# Patient Record
Sex: Male | Born: 1945
Health system: Southern US, Community
[De-identification: ages and names within clinical notes are randomized; demographics above are authoritative.]

## PROBLEM LIST (undated history)

## (undated) DIAGNOSIS — S82121A Displaced fracture of lateral condyle of right tibia, initial encounter for closed fracture: Secondary | ICD-10-CM

## (undated) DIAGNOSIS — Z8711 Personal history of peptic ulcer disease: Secondary | ICD-10-CM

## (undated) DIAGNOSIS — I10 Essential (primary) hypertension: Secondary | ICD-10-CM

## (undated) DIAGNOSIS — E785 Hyperlipidemia, unspecified: Secondary | ICD-10-CM

## (undated) DIAGNOSIS — M109 Gout, unspecified: Secondary | ICD-10-CM

## (undated) DIAGNOSIS — I219 Acute myocardial infarction, unspecified: Secondary | ICD-10-CM

## (undated) DIAGNOSIS — Z87891 Personal history of nicotine dependence: Secondary | ICD-10-CM

## (undated) DIAGNOSIS — T8859XA Other complications of anesthesia, initial encounter: Secondary | ICD-10-CM

## (undated) DIAGNOSIS — N200 Calculus of kidney: Secondary | ICD-10-CM

## (undated) DIAGNOSIS — M1990 Unspecified osteoarthritis, unspecified site: Secondary | ICD-10-CM

## (undated) DIAGNOSIS — E669 Obesity, unspecified: Secondary | ICD-10-CM

## (undated) DIAGNOSIS — I2510 Atherosclerotic heart disease of native coronary artery without angina pectoris: Secondary | ICD-10-CM

## (undated) DIAGNOSIS — R911 Solitary pulmonary nodule: Secondary | ICD-10-CM

## (undated) DIAGNOSIS — H5440 Blindness, one eye, unspecified eye: Secondary | ICD-10-CM

## (undated) DIAGNOSIS — I119 Hypertensive heart disease without heart failure: Secondary | ICD-10-CM

## (undated) DIAGNOSIS — K219 Gastro-esophageal reflux disease without esophagitis: Secondary | ICD-10-CM

## (undated) DIAGNOSIS — M519 Unspecified thoracic, thoracolumbar and lumbosacral intervertebral disc disorder: Secondary | ICD-10-CM

## (undated) DIAGNOSIS — J189 Pneumonia, unspecified organism: Secondary | ICD-10-CM

## (undated) DIAGNOSIS — C61 Malignant neoplasm of prostate: Secondary | ICD-10-CM

## (undated) DIAGNOSIS — I441 Atrioventricular block, second degree: Secondary | ICD-10-CM

## (undated) DIAGNOSIS — T4145XA Adverse effect of unspecified anesthetic, initial encounter: Secondary | ICD-10-CM

## (undated) DIAGNOSIS — G4733 Obstructive sleep apnea (adult) (pediatric): Secondary | ICD-10-CM

## (undated) DIAGNOSIS — Z8739 Personal history of other diseases of the musculoskeletal system and connective tissue: Secondary | ICD-10-CM

## (undated) HISTORY — DX: Unspecified thoracic, thoracolumbar and lumbosacral intervertebral disc disorder: M51.9

## (undated) HISTORY — DX: Malignant neoplasm of prostate: C61

## (undated) HISTORY — DX: Obstructive sleep apnea (adult) (pediatric): G47.33

## (undated) HISTORY — PX: CHOLECYSTECTOMY: SHX55

## (undated) HISTORY — DX: Hyperlipidemia, unspecified: E78.5

## (undated) HISTORY — DX: Obesity, unspecified: E66.9

## (undated) HISTORY — PX: LITHOTRIPSY: SUR834

## (undated) HISTORY — DX: Gout, unspecified: M10.9

## (undated) HISTORY — DX: Atrioventricular block, second degree: I44.1

## (undated) HISTORY — PX: LAPAROSCOPIC CHOLECYSTECTOMY: SUR755

## (undated) HISTORY — PX: LUMBAR LAMINECTOMY: SHX95

## (undated) HISTORY — PX: POSTERIOR LUMBAR FUSION: SHX6036

## (undated) HISTORY — PX: BACK SURGERY: SHX140

## (undated) HISTORY — PX: APPENDECTOMY: SHX54

## (undated) HISTORY — DX: Pneumonia, unspecified organism: J18.9

## (undated) HISTORY — DX: Essential (primary) hypertension: I10

## (undated) HISTORY — DX: Hypertensive heart disease without heart failure: I11.9

## (undated) HISTORY — PX: CORONARY ANGIOPLASTY WITH STENT PLACEMENT: SHX49

## (undated) HISTORY — DX: Solitary pulmonary nodule: R91.1

## (undated) HISTORY — DX: Personal history of peptic ulcer disease: Z87.11

## (undated) HISTORY — DX: Gastro-esophageal reflux disease without esophagitis: K21.9

## (undated) HISTORY — DX: Atherosclerotic heart disease of native coronary artery without angina pectoris: I25.10

---

## 1997-10-03 ENCOUNTER — Ambulatory Visit (HOSPITAL_COMMUNITY): Admission: RE | Admit: 1997-10-03 | Discharge: 1997-10-03 | Payer: Self-pay | Admitting: Family Medicine

## 1997-12-08 ENCOUNTER — Ambulatory Visit (HOSPITAL_COMMUNITY): Admission: RE | Admit: 1997-12-08 | Discharge: 1997-12-08 | Payer: Self-pay | Admitting: *Deleted

## 1998-06-02 ENCOUNTER — Emergency Department (HOSPITAL_COMMUNITY): Admission: EM | Admit: 1998-06-02 | Discharge: 1998-06-02 | Payer: Self-pay | Admitting: Emergency Medicine

## 1998-06-03 ENCOUNTER — Encounter: Payer: Self-pay | Admitting: Emergency Medicine

## 2001-02-20 ENCOUNTER — Ambulatory Visit (HOSPITAL_COMMUNITY): Admission: RE | Admit: 2001-02-20 | Discharge: 2001-02-20 | Payer: Self-pay | Admitting: Specialist

## 2001-02-20 ENCOUNTER — Encounter: Payer: Self-pay | Admitting: Specialist

## 2002-08-11 ENCOUNTER — Encounter: Payer: Self-pay | Admitting: Neurological Surgery

## 2002-08-15 ENCOUNTER — Encounter: Payer: Self-pay | Admitting: Neurological Surgery

## 2002-08-15 ENCOUNTER — Inpatient Hospital Stay (HOSPITAL_COMMUNITY): Admission: RE | Admit: 2002-08-15 | Discharge: 2002-08-21 | Payer: Self-pay | Admitting: Neurological Surgery

## 2002-08-17 ENCOUNTER — Encounter: Payer: Self-pay | Admitting: Neurosurgery

## 2002-08-18 ENCOUNTER — Encounter: Payer: Self-pay | Admitting: Neurological Surgery

## 2003-06-30 ENCOUNTER — Ambulatory Visit (HOSPITAL_COMMUNITY): Admission: RE | Admit: 2003-06-30 | Discharge: 2003-06-30 | Payer: Self-pay | Admitting: Neurological Surgery

## 2006-01-29 ENCOUNTER — Inpatient Hospital Stay (HOSPITAL_COMMUNITY): Admission: EM | Admit: 2006-01-29 | Discharge: 2006-01-30 | Payer: Self-pay | Admitting: Emergency Medicine

## 2007-01-26 ENCOUNTER — Inpatient Hospital Stay (HOSPITAL_COMMUNITY): Admission: EM | Admit: 2007-01-26 | Discharge: 2007-01-28 | Payer: Self-pay | Admitting: Emergency Medicine

## 2007-03-22 ENCOUNTER — Encounter: Payer: Self-pay | Admitting: Pulmonary Disease

## 2009-03-14 ENCOUNTER — Inpatient Hospital Stay (HOSPITAL_COMMUNITY): Admission: EM | Admit: 2009-03-14 | Discharge: 2009-03-17 | Payer: Self-pay | Admitting: Emergency Medicine

## 2009-03-14 ENCOUNTER — Ambulatory Visit: Payer: Self-pay | Admitting: Pulmonary Disease

## 2009-03-15 ENCOUNTER — Encounter (INDEPENDENT_AMBULATORY_CARE_PROVIDER_SITE_OTHER): Payer: Self-pay | Admitting: Emergency Medicine

## 2009-03-15 ENCOUNTER — Ambulatory Visit: Payer: Self-pay | Admitting: Vascular Surgery

## 2009-03-19 ENCOUNTER — Telehealth (INDEPENDENT_AMBULATORY_CARE_PROVIDER_SITE_OTHER): Payer: Self-pay | Admitting: *Deleted

## 2009-03-20 ENCOUNTER — Telehealth (INDEPENDENT_AMBULATORY_CARE_PROVIDER_SITE_OTHER): Payer: Self-pay | Admitting: *Deleted

## 2009-03-25 ENCOUNTER — Inpatient Hospital Stay (HOSPITAL_COMMUNITY): Admission: EM | Admit: 2009-03-25 | Discharge: 2009-03-29 | Payer: Self-pay | Admitting: Emergency Medicine

## 2009-04-11 DIAGNOSIS — K219 Gastro-esophageal reflux disease without esophagitis: Secondary | ICD-10-CM

## 2009-04-11 DIAGNOSIS — I119 Hypertensive heart disease without heart failure: Secondary | ICD-10-CM

## 2009-04-11 DIAGNOSIS — K922 Gastrointestinal hemorrhage, unspecified: Secondary | ICD-10-CM | POA: Insufficient documentation

## 2009-04-11 DIAGNOSIS — I2510 Atherosclerotic heart disease of native coronary artery without angina pectoris: Secondary | ICD-10-CM

## 2009-04-12 ENCOUNTER — Ambulatory Visit: Payer: Self-pay | Admitting: Internal Medicine

## 2009-04-12 ENCOUNTER — Encounter: Payer: Self-pay | Admitting: Internal Medicine

## 2009-04-12 DIAGNOSIS — E669 Obesity, unspecified: Secondary | ICD-10-CM

## 2009-04-12 DIAGNOSIS — G4733 Obstructive sleep apnea (adult) (pediatric): Secondary | ICD-10-CM

## 2009-05-01 ENCOUNTER — Telehealth: Payer: Self-pay | Admitting: Internal Medicine

## 2009-07-02 ENCOUNTER — Ambulatory Visit: Payer: Self-pay | Admitting: Cardiology

## 2009-07-18 ENCOUNTER — Ambulatory Visit: Payer: Self-pay | Admitting: Internal Medicine

## 2009-07-18 LAB — CONVERTED CEMR LAB
Cholesterol, target level: 200 mg/dL
HDL goal, serum: 40 mg/dL

## 2009-07-26 DIAGNOSIS — R911 Solitary pulmonary nodule: Secondary | ICD-10-CM | POA: Insufficient documentation

## 2009-08-01 ENCOUNTER — Ambulatory Visit: Payer: Self-pay | Admitting: Pulmonary Disease

## 2009-08-06 ENCOUNTER — Ambulatory Visit: Payer: Self-pay | Admitting: Internal Medicine

## 2009-08-27 ENCOUNTER — Encounter: Payer: Self-pay | Admitting: Pulmonary Disease

## 2009-09-03 ENCOUNTER — Ambulatory Visit: Payer: Self-pay | Admitting: Pulmonary Disease

## 2010-03-13 ENCOUNTER — Telehealth: Payer: Self-pay | Admitting: Internal Medicine

## 2010-03-22 ENCOUNTER — Ambulatory Visit: Payer: Self-pay | Admitting: Cardiology

## 2010-04-11 ENCOUNTER — Telehealth: Payer: Self-pay | Admitting: Internal Medicine

## 2010-05-01 ENCOUNTER — Ambulatory Visit: Payer: Self-pay | Admitting: Internal Medicine

## 2010-05-02 ENCOUNTER — Encounter: Admission: RE | Admit: 2010-05-02 | Discharge: 2010-05-02 | Payer: Self-pay | Admitting: Family Medicine

## 2010-07-11 ENCOUNTER — Other Ambulatory Visit: Payer: Self-pay | Admitting: Internal Medicine

## 2010-07-11 DIAGNOSIS — R911 Solitary pulmonary nodule: Secondary | ICD-10-CM

## 2010-07-16 NOTE — Assessment & Plan Note (Signed)
Summary: NP follow up - med calendar   Primary Provider/Referring Provider:  Dr. Dierdre Forth  CC:  new med calendar - pt brought all meds with him today.  no complaints..  History of Present Illness: IOV 04/12/2009: 65 year old morbidly obese male, ex heavy smoker, OSA but non compliant with CPAP, hx of COPD nos. Was hospitalized end Sept - early Oct 2010 for AE-COPD and discharged. After that another admission mid-oct 2010 for gastroentereitis but CT showed Bilaral LL pna as well that was probably the cause of AE-COPD earlier in the month. Here today at pulm clinic to establish pulm care for COPD. Never had PFTs in past. Feels at baseline health currently. Dyspnea is mild, present after walking half mile, relieved with exertion. Does have associated mild dry cough 2-3 times per day. Denies associated chest pain, sputum, wheeze, hemoptysis, orthopnea, paroxysmal nocturnal dyspnea.  REC PFTs Weight Loss Epworth questionaire at folowup Repeat CT Jan 2011  OV 07/18/2009: Followup Obesity, COPD workup, OSA, Pneumonia October 2010 followup in ex-smoker. Since last visit compliant with spiriva, pulmicort. Walks 1 mile but c/o lack of energy and feeling tired all the time. Admits to snoring. Epworth sleepiness score is 10. REviewed PFTs from Mar 17, 2009; shows restriction wiht low DLCO ( TLC 4.9L/65%,  fev1 2.2L/63%, FVC 2.9L/57%, Ratio 76). Todays's spirometry also shows restriction without drop in small airways. Had CT 06/22/2009- shows muliptle pleural based intrafissural nodules without evidence of pneumonia  August 06, 2009--Presents for a follow up and med review. Last visit, felt no COPD, spiriva /symbicort stopped. He was set up w/ sleep evaluation with Dr. Elsworth Soho. He has known OSA and wears CPAP qhs. O2 was stopped last visit. Doing well since last visit. No increased dyspnea, wheezing or cough. We reviewed all his meds today and made a med calendar. Denies chest pain,  orthopnea, hemoptysis, fever,  n/v/d, edema, headache.   Preventive Screening-Counseling & Management  Alcohol-Tobacco     Smoking Status: quit  Medications Prior to Update: 1)  Crestor 10 Mg Tabs (Rosuvastatin Calcium) .... Take 1 Tab By Mouth Every Other Day 2)  Omeprazole 20 Mg Cpdr (Omeprazole) .... Take 1 Tablet By Mouth Once A Day 3)  Amlodipine Besylate 10 Mg Tabs (Amlodipine Besylate) .... Take 1 Tablet By Mouth Once A Day 4)  Proair Hfa 108 (90 Base) Mcg/act  Aers (Albuterol Sulfate) .Marland Kitchen.. 1-2 Puffs Every 4-6 Hours As Needed 5)  Hydrochlorothiazide 25 Mg Tabs (Hydrochlorothiazide) .... Take 1 Tablet By Mouth Once A Day 6)  Bayer Aspirin 325 Mg Tabs (Aspirin) .... Take 1 Tablet By Mouth Once A Day 7)  Lisinopril 20 Mg Tabs (Lisinopril) .... Take 1 Tablet By Mouth Once A Day  Allergies (verified): No Known Drug Allergies  Past History:  Past Medical History: Last updated: 07/18/2009 Current Problems:  MYOCARDIAL INFARCTION (ICD-410.90) OBESITY (ICD-278.00) HYPERLIPIDEMIA (ICD-272.4) OBSTRUCTIVE SLEEP APNEA (ICD-327.23) >Noncompliant with CPAP GERD (ICD-530.81) Hx of GI BLEEDING (ICD-578.9) HYPERTENSION (ICD-401.9) CORONARY ARTERY DISEASE (ICD-414.00) COPD (ICD-496) >AE-COPD admit 03/13/2009 - 03/17/2009, CT abd lung cut 03/24/2009: Bilateral LL pneumonia > No evidence of COPD PFTs Oct 2010 and spiro Feb 2011 DYSPNEA >Multifactoria - COPD, Obesity   ACUTE GASTROENTERITIS >Admit 03/24/2009 - 03/29/2009 NORMAL TSH  SEpt 2010  Past Surgical History: Last updated: 04/12/2009 stent x 2: 1996, 2008 cholectystectomy Back Surgery Colon Surgery  Family History: Last updated: 04/12/2009 8 total siblings 1 Sister-COPD 3 siblings have sleep apnea 1 brother died age 43 with MI Mother-Htn, CAD  Father- died at age 75 with brain aneurysm  Social History: Last updated: 04/12/2009 pt is a former smoker. started at age 24 to age 19.  3 ppd at time he quit in 55. pt is married and lives with his wife.    Risk Factors: Smoking Status: quit (08/06/2009)  Social History: Smoking Status:  quit  Review of Systems      See HPI  Vital Signs:  Patient profile:   65 year old male Height:      74 inches Weight:      312 pounds BMI:     40.20 O2 Sat:      93 % on Room air Temp:     98.1 degrees F oral Pulse rate:   86 / minute BP sitting:   134 / 84  (left arm) Cuff size:   large  Vitals Entered By: Parke Poisson CNA (August 06, 2009 2:16 PM)  O2 Flow:  Room air CC: new med calendar - pt brought all meds with him today.  no complaints. Is Patient Diabetic? No Comments Medications reviewed with patient Daytime contact number verified with patient. Parke Poisson CNA  August 06, 2009 2:16 PM    Physical Exam  Additional Exam:  GEN: A/Ox3; pleasant, obese HEENT:  [place]/AT, , EACs-clear, TMs-wnl, NOSE-clear, THROAT-clear NECK:  Supple w/ fair ROM; no JVD; normal carotid impulses w/o bruits; no thyromegaly or nodules palpated; no lymphadenopathy. RESP  Coarse BS w/ no wheezing  CARD:  RRR, no m/r/g   GI:   Soft & nt; nml bowel sounds; no organomegaly or masses detected, obese abd.  Musco: Warm bil,  no calf tenderness edema, clubbing, pulses intact     Impression & Recommendations:  Problem # 1:  COPD [id number]) PFTs Oct 2010  do not show COPD; it shows restrictin with low dlco. Likewise Feb 2011 shows restriction. CT Jan 2011 does not show emphysema. [place] desaturation test feb 2011 is normal. Meds reviewed with pt education and computerized med calendar completed/adjusted.   He is doing well off inhalers. follow up for CT as scheduled.     Problem # 2:  PULMONARY NODULE (ICD-518.89)  follow up for CT as scheduled.   Orders: Est. Patient Level III [id number])  Complete Medication List: 1)  Crestor 10 Mg Tabs (Rosuvastatin calcium) .... Take 1 tab by mouth every other day 2)  Omeprazole 20 Mg Cpdr (Omeprazole) .... Take 1 tablet by mouth once a day 3)  Amlodipine Besylate  10 Mg Tabs (Amlodipine besylate) .... Take 1 tablet by mouth once a day 4)  Proair Hfa 108 (90 Base) Mcg/act Aers (Albuterol sulfate) .Marland Kitchen.. 1-2 puffs every 4-6 hours as needed 5)  Hydrochlorothiazide 25 Mg Tabs (Hydrochlorothiazide) .... Take 1 tablet by mouth once a day 6)  Bayer Aspirin 325 Mg Tabs (Aspirin) .... Take 1 tablet by mouth once a day 7)  Lisinopril 20 Mg Tabs (Lisinopril) .... Take 1 tablet by mouth once a day  Patient Instructions: 1)  follow up Dr. Elsworth Soho for sleep evaluation 2)  follow up Dr. Chase Caller in  8 months with ct scan of chest for spots in lung 3)  if your breathing gets worse after stoppig call us for a sooner visit.  4)  Follow your med calendar closely and bring to each visit.     Appended Document: med calendar update    Clinical Lists Changes  Medications: Changed medication from CRESTOR 10 MG TABS (ROSUVASTATIN CALCIUM) take 1 tab by mouth every  other day to CRESTOR 10 MG TABS (ROSUVASTATIN CALCIUM) take 1 tab by mouth every other night - Signed Added new medication of * CPAP wear at bedtime - Signed Changed medication from PROAIR HFA 108 (90 BASE) MCG/ACT  AERS (ALBUTEROL SULFATE) 1-2 puffs every 4-6 hours as needed to PROAIR HFA 108 (90 BASE) MCG/ACT  AERS (ALBUTEROL SULFATE) 2 puffs every 4-6 hours as needed - Signed Removed medication of AMLODIPINE BESYLATE 10 MG TABS (AMLODIPINE BESYLATE) Take 1 tablet by mouth once a day - Signed

## 2010-07-16 NOTE — Assessment & Plan Note (Signed)
Summary: 1 month/apc   Visit Type:  Follow-up Copy to:  [facility] Primary Provider/Referring Provider:  Dr. Dierdre Forth  CC:  Pt here for 1 month follow up. Pt states is using CPAP machine a couple hours a night. Pt states machines makes him feel like he is "blown up".  History of Present Illness: 65/M obese ex smoker referred for management of obstructive sleep apnea. He was hospitalised 10/10 for bibasal pneumonia & discharged on CPAP. He has since seen Dr Chase Caller, lung function assessed & felt he did not have COPD. Off all breathing meds & doing well, using MDIs once in a while. Epworth Sleepiness Score 12, sleeps during TV programs wife moved out of the bedroom x 5 yrs due to loud snoring, describes choking episodes that wake him up, Sleep latency minimal, 1-2 awakenings, wakes up at 8A with dry mouth, no headaches. Had PSG 10/08, CPAP was titrated to 14 cm for control of events, CPAP initiated only after 10/10 , claustrophobic, stopped using it. Currently per [facility], CPAP is on auto mode Put on O2 on dc 10/10 , not using it now.  September 03, 2009 5:04 PM  c/o bloating, claustrophobia is better with nasal pillows.  using CPAP x 2-3 hrs/ night for the past 3 mnths download 2/16 -3/14 on 10 cm  >>poor compliance , leak +, AHI 11/h  Current Medications (verified): 1)  Lisinopril 20 Mg Tabs (Lisinopril) .... Take 1 Tablet By Mouth Once A Day 2)  Hydrochlorothiazide 25 Mg Tabs (Hydrochlorothiazide) .... Take 1 Tablet By Mouth Once A Day 3)  Crestor 10 Mg Tabs (Rosuvastatin Calcium) .... Take 1 Tab By Mouth Every Other Night 4)  Bayer Aspirin 325 Mg Tabs (Aspirin) .... Take 1 Tablet By Mouth Once A Day 5)  Omeprazole 20 Mg Cpdr (Omeprazole) .... Take 1 Tablet By Mouth Once A Day 6)  Cpap .... Wear At Bedtime 7)  Proair Hfa 108 (90 Base) Mcg/act  Aers (Albuterol Sulfate) .... 2 Puffs Every 4-6 Hours As Needed 8)  Tylenol Extra Strength 500 Mg Tabs (Acetaminophen) .... As Needed For  Pain  Allergies (verified): No Known Drug Allergies  Past History:  Past Medical History: Last updated: 07/18/2009 Current Problems:  MYOCARDIAL INFARCTION (ICD-410.90) OBESITY (ICD-278.00) HYPERLIPIDEMIA (ICD-272.4) OBSTRUCTIVE SLEEP APNEA (ICD-327.23) >Noncompliant with CPAP GERD (ICD-530.81) Hx of GI BLEEDING (ICD-578.9) HYPERTENSION (ICD-401.9) CORONARY ARTERY DISEASE (ICD-414.00) COPD (ICD-496) >AE-COPD admit 03/13/2009 - 03/17/2009, CT abd lung cut 03/24/2009: Bilateral LL pneumonia > No evidence of COPD PFTs Oct 2010 and spiro Feb 2011 DYSPNEA >Multifactoria - COPD, Obesity   ACUTE GASTROENTERITIS >Admit 03/24/2009 - 03/29/2009 NORMAL TSH  SEpt 2010  Social History: Last updated: 08/01/2009 pt is a former smoker. started at age 65 to age 1.  3 ppd at time he quit in 65. pt is married and lives with his wife.  1 daughter, 1 son  Review of Systems  The patient denies anorexia, fever, weight loss, weight gain, vision loss, decreased hearing, hoarseness, chest pain, dyspnea on exertion, peripheral edema, prolonged cough, headaches, hemoptysis, abdominal pain, melena, hematochezia, severe indigestion/heartburn, hematuria, muscle weakness, suspicious skin lesions, transient blindness, difficulty walking, depression, unusual weight change, and abnormal bleeding.    Vital Signs:  Patient profile:   65 year old male Height:      74 inches Weight:      315.13 pounds O2 Sat:      93 % on Room air Temp:     98.5 degrees F oral Pulse rate:  65 / minute BP sitting:   156 / 86  (right arm) Cuff size:   large  Vitals Entered By: Iran Planas CMA (September 03, 2009 4:39 PM)  O2 Flow:  Room air CC: Pt here for 1 month follow up. Pt states is using CPAP machine a couple hours a night. Pt states machines makes him feel like he is "blown up" Comments Medications reviewed with patient Verified contact number and pharmacy with patient Iran Planas CMA  September 03, 2009 4:39  PM    Physical Exam  Additional Exam:  GEN: A/Ox3; pleasant, obese HEENT:  [place]/AT, , EACs-clear, TMs-wnl, NOSE-clear, THROAT-clear NECK:  Supple w/ fair ROM; no JVD; normal carotid impulses w/o bruits; no thyromegaly or nodules palpated; no lymphadenopathy. RESP  Coarse BS w/ no wheezing  CARD:  RRR, no m/r/g   Musco: Warm bil,  no calf tenderness edema, clubbing, pulses intact     Impression & Recommendations:  Problem # 1:  SLEEP APNEA (ICD-780.57) Compliance encouraged, wt loss emphasized, asked to avoid meds with sedative side effects, cautioned against driving when sleepy.  Trial of autoCPAP to see if lower (net ) pressure will decrease bloating sensation. If not, may have to abandon CPAP altogether & look at oral appliance as alternative. Orders: DME Referral (DME) Est. Patient Level III [id number])  Medications Added to Medication List This Visit: 1)  Tylenol Extra Strength 500 Mg Tabs (Acetaminophen) .... As needed for pain  Patient Instructions: 1)  Please schedule a follow-up appointment in 3 months. 2)  Change to auto settings & recheck download in 2 weeks

## 2010-07-16 NOTE — Assessment & Plan Note (Signed)
Summary: sleep/apc   Visit Type:  Follow-up Copy to:  [facility] Primary Provider/Referring Provider:  Dr. Maury Dus  CC:  Pt here for sleep consult.  History of Present Illness: 65/M obese ex smoker referred for management of obstructive sleep apnea. He was hospitalised 10/10 for bibasal pneumonia & discharged on CPAP. He has since seen Dr Chase Caller, lung function assessed & felt he did not have COPD. Off all breathing meds & doing well, using MDIs once in a while. Epworth Sleepiness Score 12, sleeps during TV programs wife moved out of the bedroom x 5 yrs due to loud snoring, describes choking episodes that wake him up, Sleep latency minimal, 1-2 awakenings, wakes up at 8A with dry mouth, no headaches. Had PSG 10/08, CPAP was titrated to 14 cm for control of events, CPAP initiated only after 10/10 , claustrophobic, stopped using it. Currently per [facility], CPAP is on auto mode Put on O2 on dc 10/10 , not using it now. There is no history suggestive of cataplexy, sleep paralysis or parasomnias   2/16 >> fitted with fisher paykel nasal pillows  Preventive Screening-Counseling & Management  Alcohol-Tobacco     Smoking Status: quit     Packs/Day: 3.0     Year Started: 1952     Year Quit: 1985   History of Present Illness: don't sleep much  What time do you typically go to bed?(between what hours): 11-11:30pm  How long does it take you to fall asleep? minutes  How many times during the night do you wake up? 1 to 2 times  What time do you get out of bed to start your day? 8am  Do you drive or operate heavy machinery in your occupation? no  How much has your weight changed (up or down) over the past two years? (in pounds): 50 +  Have you ever had a sleep study before?  If yes,when and where: Yes @ [facility] on [address]  Do you currently use CPAP ? If so , at what pressure? no  Do you wear oxygen at any time? If yes, how many liters per minute? no Current Medications  (verified): 1)  Crestor 10 Mg Tabs (Rosuvastatin Calcium) .... Take 1 Tab By Mouth Every Other Day 2)  Omeprazole 20 Mg Cpdr (Omeprazole) .... Take 1 Tablet By Mouth Once A Day 3)  Amlodipine Besylate 10 Mg Tabs (Amlodipine Besylate) .... Take 1 Tablet By Mouth Once A Day 4)  Proair Hfa 108 (90 Base) Mcg/act  Aers (Albuterol Sulfate) .Marland Kitchen.. 1-2 Puffs Every 4-6 Hours As Needed 5)  Hydrochlorothiazide 25 Mg Tabs (Hydrochlorothiazide) .... Take 1 Tablet By Mouth Once A Day 6)  Bayer Aspirin 325 Mg Tabs (Aspirin) .... Take 1 Tablet By Mouth Once A Day 7)  Lisinopril 20 Mg Tabs (Lisinopril) .... Take 1 Tablet By Mouth Once A Day  Allergies (verified): No Known Drug Allergies  Past History:  Past Medical History: Last updated: 07/18/2009 Current Problems:  MYOCARDIAL INFARCTION (ICD-410.90) OBESITY (ICD-278.00) HYPERLIPIDEMIA (ICD-272.4) OBSTRUCTIVE SLEEP APNEA (ICD-327.23) >Noncompliant with CPAP GERD (ICD-530.81) Hx of GI BLEEDING (ICD-578.9) HYPERTENSION (ICD-401.9) CORONARY ARTERY DISEASE (ICD-414.00) COPD (ICD-496) >AE-COPD admit 03/13/2009 - 03/17/2009, CT abd lung cut 03/24/2009: Bilateral LL pneumonia > No evidence of COPD PFTs Oct 2010 and spiro Feb 2011 DYSPNEA >Multifactoria - COPD, Obesity   ACUTE GASTROENTERITIS >Admit 03/24/2009 - 03/29/2009 NORMAL TSH  SEpt 2010  Family History: Last updated: 04/12/2009 8 total siblings 1 Sister-COPD 3 siblings have sleep apnea 1 brother died age  40 with MI Mother-Htn, CAD Father- died at age 79 with brain aneurysm  Social History: Last updated: 08/01/2009 pt is a former smoker. started at age 27 to age 25.  3 ppd at time he quit in 24. pt is married and lives with his wife.  1 daughter, 1 son  Past Surgical History: stent x 2: 1996, 2008 cholectystectomy Back Surgery Colon Surgery Appendectomy  Social History: pt is a former smoker. started at age 32 to age 54.  3 ppd at time he quit in 50. pt is married and lives with  his wife.  1 daughter, 1 sonSmoking Status:  quit Packs/Day:  3.0  Review of Systems       The patient complains of non-productive cough, indigestion, weight change, nasal congestion/difficulty breathing through nose, sneezing, ear ache, hand/feet swelling, and joint stiffness or pain.  The patient denies shortness of breath with activity, shortness of breath at rest, productive cough, coughing up blood, chest pain, irregular heartbeats, acid heartburn, loss of appetite, abdominal pain, difficulty swallowing, sore throat, tooth/dental problems, headaches, itching, anxiety, depression, rash, change in color of mucus, and fever.    Vital Signs:  Patient profile:   65 year old male Height:      74 inches Weight:      315 pounds O2 Sat:      96 % on Room air Temp:     97.4 degrees F oral Pulse rate:   77 / minute BP sitting:   112 / 72  (left arm) Cuff size:   large  Vitals Entered By: Iran Planas CMA (August 01, 2009 1:40 PM)  O2 Flow:  Room air CC: Pt here for sleep consult Comments Medications reviewed with patient Verified pt's contact number Iran Planas CMA  August 01, 2009 1:41 PM    Physical Exam  Additional Exam:  Gen. Pleasant, obese, in no distress, normal affect ENT - no lesions, no post nasal drip, class 3 airway Neck: No JVD, no thyromegaly, no carotid bruits Lungs: no use of accessory muscles, no dullness to percussion, clear without rales or rhonchi  Cardiovascular: Rhythm regular, heart sounds  normal, no murmurs or gallops, no peripheral edema Abdomen: soft and non-tender, no hepatosplenomegaly, BS normal. Musculoskeletal: No deformities, no cyanosis or clubbing Neuro:  alert, non focal     Impression & Recommendations:  Problem # 1:  SLEEP APNEA (ICD-780.57)  The pathophysiology of obstructive sleep apnea, it's cardiovascular consequences and modes of treatment including CPAP were discussed with the patient in great detail.  Main problem hee  seems to be claustrophobia, Will send him over to the sleep lab for mask desensitization, he may do well with nasal pillows although pressure requirement is slightly high. Will lower to 10 cm until he is able to use it & then increase back to goal 14 cm - & Fu download at that level Compliance encouraged, wt loss emphasized, asked to avoid meds with sedative side effects, cautioned against driving when sleepy.   Orders: DME Referral (DME) Consultation Level III [id number])  Medications Added to Medication List This Visit: 1)  Hydrochlorothiazide 25 Mg Tabs (Hydrochlorothiazide) .... Take 1 tablet by mouth once a day  Patient Instructions: 1)  Copy sent to: Dr Mariea Clonts 2)  Please schedule a follow-up appointment in 1 month. 3)  Please go over to the sleep lab [phone] for mask desensitization 4)  Resume using CPAP machine at home - I wil lower settings if needed. 5)  Call us in  1 week & report

## 2010-07-16 NOTE — Assessment & Plan Note (Signed)
Summary: rov/ mbw   Visit Type:  Follow-up Primary Provider/Referring Provider:  Dr. Dierdre Forth  CC:  Pt here for follow-up to discuss CT results.  and Lipid Management.  History of Present Illness: IOV 04/12/2009: 65 year old morbidly obese male, ex heavy smoker, OSA but non compliant with CPAP, hx of COPD nos. Was hospitalized end Sept - early Oct 2010 for AE-COPD and discharged. After that another admission mid-oct 2010 for gastroentereitis but CT showed Bilaral LL pna as well that was probably the cause of AE-COPD earlier in the month. Here today at pulm clinic to establish pulm care for COPD. Never had PFTs in past. Feels at baseline health currently. Dyspnea is mild, present after walking half mile, relieved with exertion. Does have associated mild dry cough 2-3 times per day. Denies associated chest pain, sputum, wheeze, hemoptysis, orthopnea, paroxysmal nocturnal dyspnea.  REC PFTs Weight Loss Epworth questionaire at folowup Repeat CT Jan 2011  OV 07/18/2009: Followup Obesity, COPD workup, OSA, Pneumonia October 2010 followup in ex-smoker. Since last visit compliant with spiriva, pulmicort. Walks 1 mile but c/o lack of energy and feeling tired all the time. Admits to snoring. Epworth sleepiness score is 10. REviewed PFTs from Mar 17, 2009; shows restriction wiht low DLCO ( TLC 4.9L/65%,  fev1 2.2L/63%, FVC 2.9L/57%, Ratio 76). Todays's spirometry also shows restriction without drop in small airways. Had CT 06/22/2009- shows muliptle pleural based intrafissural nodules without evidence of pneumonia  Lipid Management History:      Positive NCEP/ATP III risk factors include male age 46 years old or older, hypertension, and ASHD (either angina/prior MI/prior CABG).    CardioPerfect Spirometry  ID: [id number] Patient: KHAMRYN, BRITO DOB: 12-11-1945 Age: 66 Years Old Sex: Male Race: White Height: 74 Weight: 311 Status: Unconfirmed Past Medical History:  Current Problems:  MYOCARDIAL  INFARCTION (ICD-410.90) OBESITY (ICD-278.00) HYPERLIPIDEMIA (ICD-272.4) OBSTRUCTIVE SLEEP APNEA (ICD-327.23) >Noncompliant with CPAP GERD (ICD-530.81) Hx of GI BLEEDING (ICD-578.9) HYPERTENSION (ICD-401.9) CORONARY ARTERY DISEASE (ICD-414.00) COPD (ICD-496) >AE-COPD admit 03/13/2009 - 03/17/2009, CT abd lung cut 03/24/2009: Bilateral LL pneumonia DYSPNEA >Multifactoria - COPD, Obesity   ACUTE GASTROENTERITIS >Admit 03/24/2009 - 03/29/2009 Recorded: 07/18/2009 4:02 PM  Parameter  Measured Predicted %Predicted FVC     2.71        4.75        56.90 FEV1     2.16        3.55        60.80 FEV1%   79.79        74.64        106.90 PEF    4.99        9.02        55.30   Interpretation: c/w restrictiin  Current Medications (verified): 1)  Budesonide 0.5 Mg/47ml Susp (Budesonide) .... Use 1 Vial in Nebulizer Two Times A Day 2)  Crestor 10 Mg Tabs (Rosuvastatin Calcium) .... Take 1 Tab By Mouth Every Other Day 3)  Spiriva Handihaler 18 Mcg Caps (Tiotropium Bromide Monohydrate) .... Inhale Contents of 1 Capsule Once A Day 4)  Omeprazole 20 Mg Cpdr (Omeprazole) .... Take 1 Tablet By Mouth Once A Day 5)  Amlodipine Besylate 10 Mg Tabs (Amlodipine Besylate) .... Take 1 Tablet By Mouth Once A Day 6)  Proair Hfa 108 (90 Base) Mcg/act  Aers (Albuterol Sulfate) .Marland Kitchen.. 1-2 Puffs Every 4-6 Hours As Needed 7)  Hydrochlorothiazide 12.5 Mg Caps (Hydrochlorothiazide) .... Take 1 Tablet By Mouth Once A Day 8)  Bayer Aspirin  325 Mg Tabs (Aspirin) .... Take 1 Tablet By Mouth Once A Day 9)  Lisinopril 20 Mg Tabs (Lisinopril) .... Take 1 Tablet By Mouth Once A Day 10)  Oxygen 2 Liters .... At Night  Allergies (verified): No Known Drug Allergies  Past History:  Family History: Last updated: 04/12/2009 8 total siblings 1 Sister-COPD 3 siblings have sleep apnea 1 brother died age 46 with MI Mother-Htn, CAD Father- died at age 73 with brain aneurysm  Social History: Last updated: 04/12/2009 pt is a  former smoker. started at age 70 to age 44.  3 ppd at time he quit in 45. pt is married and lives with his wife.   Past Medical History: Current Problems:  MYOCARDIAL INFARCTION (ICD-410.90) OBESITY (ICD-278.00) HYPERLIPIDEMIA (ICD-272.4) OBSTRUCTIVE SLEEP APNEA (ICD-327.23) >Noncompliant with CPAP GERD (ICD-530.81) Hx of GI BLEEDING (ICD-578.9) HYPERTENSION (ICD-401.9) CORONARY ARTERY DISEASE (ICD-414.00) COPD (ICD-496) >AE-COPD admit 03/13/2009 - 03/17/2009, CT abd lung cut 03/24/2009: Bilateral LL pneumonia > No evidence of COPD PFTs Oct 2010 and spiro Feb 2011 DYSPNEA >Multifactoria - COPD, Obesity   ACUTE GASTROENTERITIS >Admit 03/24/2009 - 03/29/2009 NORMAL TSH  SEpt 2010  Past Surgical History: Reviewed history from 04/12/2009 and no changes required. stent x 2: 1996, 2008 cholectystectomy Back Surgery Colon Surgery  Family History: Reviewed history from 04/12/2009 and no changes required. 8 total siblings 1 Sister-COPD 3 siblings have sleep apnea 1 brother died age 70 with MI Mother-Htn, CAD Father- died at age 65 with brain aneurysm  Social History: Reviewed history from 04/12/2009 and no changes required. pt is a former smoker. started at age 42 to age 26.  3 ppd at time he quit in 65. pt is married and lives with his wife.   Review of Systems  The patient denies anorexia, fever, weight loss, weight gain, vision loss, decreased hearing, hoarseness, chest pain, syncope, dyspnea on exertion, peripheral edema, prolonged cough, headaches, hemoptysis, abdominal pain, melena, hematochezia, severe indigestion/heartburn, hematuria, incontinence, genital sores, muscle weakness, suspicious skin lesions, transient blindness, difficulty walking, depression, unusual weight change, abnormal bleeding, enlarged lymph nodes, angioedema, breast masses, and testicular masses.         fatigue  Vital Signs:  Patient profile:   65 year old male Height:      74 inches Weight:       311 pounds O2 Sat:      91 % on Room air Temp:     98.1 degrees F oral Pulse rate:   87 / minute Cuff size:   large  Vitals Entered By: [place] Bing CMA (July 18, 2009 3:16 PM)  O2 Flow:  Room air  Serial Vital Signs/Assessments:  Comments: Ambulatory Pulse Oximetry  Resting; HR___79__    02 Sat_94%____  Lap1 (185 feet)   HR__101___   02 Sat___92%__ Lap2 (185 feet)   HR_110____   02 Sat__93%___    Lap3 (185 feet)   HR_107____   02 Sat_92%____  __X_Test Completed without Difficulty ___Test Stopped due to:  Stefani Dama RCP, LPN  February  2, 624THL 4:13 PM  By: Stefani Dama RCP, LPN    CC: Pt here for follow-up to discuss CT results. , Lipid Management Comments Medications reviewed with patient [place] Bing CMA  July 18, 2009 3:16 PM Daytime phone number verified with patient.    Physical Exam  General:  obese.  obese.   Head:  normocephalic and atraumatic Eyes:  PERRLA/EOM intact; conjunctiva and sclera clear Ears:  TMs intact and clear with normal  canals Nose:  no deformity, discharge, inflammation, or lesions Mouth:  no deformity or lesionsMelampatti Class IV.  Melampatti Class IV.   Neck:  no masses, thyromegaly, or abnormal cervical nodes Chest Wall:  no deformities noted Lungs:  clear bilaterally to auscultation and percussiondecreased BS bilateral.  decreased BS bilateral.   Heart:  regular rate and rhythm, S1, S2 without murmurs, rubs, gallops, or clicks Abdomen:  obese soft no mass Msk:  no deformity or scoliosis noted with normal posture Pulses:  pulses normal Neurologic:  CN II-XII grossly intact with normal reflexes, coordination, muscle strength and tone Skin:  intact without lesions or rashes Cervical Nodes:  no significant adenopathy Axillary Nodes:  no significant adenopathy Psych:  alert and cooperative; normal mood and affect; normal attention span and concentration   MISC. Report  Procedure date:   03/17/2009  Findings:      REviewed PFTs from Mar 17, 2009; shows restriction wiht low DLCO ( TLC 4.9L/65%,  fev1 2.2L/63%, FVC 2.9L/57%, Ratio 76). Todays's spirometry also shows restriction without drop in small airways.  CT of Chest  Procedure date:  06/22/2009  Findings:       Had CT 06/22/2009- shows muliptle pleural based intrafissural nodules without evidence of pneumonia  Comments:      reviewed personally  Pulmonary Function Test Date: 07/18/2009 4:02 PM Gender: Male  Pre-Spirometry FVC    Value: 2.71 L/min   % Pred: 56.90 % FEV1    Value: 2.16 L     Pred: 3.55 L     % Pred: 60.80 % FEV1/FVC  Value: 79.79 %     % Pred: 106.90 %  Comments: restriction  Impression & Recommendations:  Problem # 1:  PULMONARY NODULE (ICD-518.89) Assessment New On CT Jan 2011, multiple small nodules pleural based. Old pneumonia from Oct 2010 is resolved  plan repeat Ct chest 8 months Orders: Radiology Referral (Radiology) Est. Patient Level IV [id number])  Problem # 2:  BRONCHOPNEUMONIA ORGANISM UNSPECIFIED (ICD-485) Assessment: Improved Developed pna in Oct 2010. resolved on ct chest jan 2011  plan no furhter followup The following medications were removed from the medication list:    Metronidazole 500 Mg Tabs (Metronidazole) .Marland Kitchen... Take 1 tab by mouth every 8 hours for 3 days  Orders: Radiology Referral (Radiology) Est. Patient Level IV [id number])  Problem # 3:  SLEEP APNEA (ICD-780.57) Assessment: Unchanged  Snores+ . Tired +. ESS score is 10  plan refer Dr. Elsworth Soho  Orders: Sleep Disorder Referral (Sleep Disorder) Est. Patient Level IV [id number])  Problem # 4:  COPD ([id number]) Assessment: Unchanged PFTs Oct 2010  do not show COPD; it shows restrictin with low dlco. Likewise Feb 2011 shows restriction. CT Jan 2011 does not show emphysema. [place] desaturation test feb 2011 is normal. Doubt he has cOPD  plan stop all copd inhalers review at followup if worsens, review sooner  - will need spirometry at that pont  Lipid Assessment/Plan:      Based on NCEP/ATP III, the patient's risk factor category is "history of coronary disease, peripheral vascular disease, cerebrovascular disease, or aortic aneurysm".  The patient's lipid goals are as follows: Total cholesterol goal is 200; LDL cholesterol goal is 100; HDL cholesterol goal is 40; Triglyceride goal is 150.     Patient Instructions: 1)  your breathing test and scan do not show COPD 2)  Therefore, stop budesonide, and spiriva and symbicort 3)  Just take pro-air 2 puff as needed when short of breath 4)  you do  not need oxygen either when you walk or at rest 5)  your tiredness might be from sleep apnea ' 6)  i will set you up wiht sleep doctor 7)  return to see me in 8 months with ct scan of chest for spots in lung 8)  if your breathing gets worse after stoppig above inhalers, call us 9)  return to see Tammy Parret over next 2 weeks for med calendar   Immunization History:  Influenza Immunization History:    Influenza:  fluvax 3+ (03/19/2009)  Pneumovax Immunization History:    Pneumovax:  pneumovax (03/19/2009)

## 2010-07-16 NOTE — Progress Notes (Signed)
Summary: nos appt  Phone Note Call from Patient   Caller: [email or web address]  Call For: Christabel Camire Summary of Call: Rsc nos from 10/26 to 11/15. Initial call taken by: Netta Neat,  April 11, 2010 1:13 PM

## 2010-07-16 NOTE — Progress Notes (Signed)
Summary: nos appt  Phone Note Call from Patient   Caller: [email or web address]  Call For: ramaswamy Summary of Call: In ref to nos from 9/27, pt states he will call to rsc. Initial call taken by: Netta Neat,  March 13, 2010 10:28 AM

## 2010-07-16 NOTE — Assessment & Plan Note (Signed)
Summary: F/U/MHH   Visit Type:  Follow-up Copy to:  Ramaswamy Primary Provider/Referring Provider:  Dr. Dierdre Forth  CC:  Follow-up to discuss CT results. Pt c/o pain on left side of chest x 2 days. Marland Kitchen  History of Present Illness:  Folloup dyspnea due to obesity (copd ruled out) and LLL 6 mmm pulmonary nodule since Jan 2011 in morbidly obese patient, OSA - intolerant to cPAP, sp bilateral LL pneumonia Oct 2010 and ex smoker.    May 01, 2010: Last visit was in Feb 2011. Following now after CT chest. Dyspnea staable - class 3 still. Not lost weight. Saw Dr Elsworth Soho in interim and advised CPAP but refused due to intolerance. Not replased into smoking. No problmes after stopping inhalers. Has new left flank pain x 4 days, rate is as severe, radiated to back and fronth. No radiation. Constant. Slowy progressive. EXertion and weight bearing on it makes pain worse. Not relieved by alleve. Denies trauma. Denies associated cough, fever, nausea, vomit, diarrhea, chest pain, diaphoresis, chills, syncope. States it is different from his MI pain.     Preventive Screening-Counseling & Management  Alcohol-Tobacco     Smoking Status: quit     Packs/Day: 3.0     Year Started: 1952     Year Quit: 1985     Pack years: 17  Current Medications (verified): 1)  Lisinopril 20 Mg Tabs (Lisinopril) .... Take 1 Tablet By Mouth Once A Day 2)  Hydrochlorothiazide 25 Mg Tabs (Hydrochlorothiazide) .... Take 1 Tablet By Mouth Once A Day 3)  Crestor 10 Mg Tabs (Rosuvastatin Calcium) .... Take 1 Tab By Mouth Every Other Night 4)  Bayer Aspirin 325 Mg Tabs (Aspirin) .... Take 1 Tablet By Mouth Once A Day 5)  Omeprazole 20 Mg Cpdr (Omeprazole) .... Take 1 Tablet By Mouth Once A Day 6)  Cpap .... ** Pt Not Using** 7)  Proair Hfa 108 (90 Base) Mcg/act  Aers (Albuterol Sulfate) .... 2 Puffs Every 4-6 Hours As Needed 8)  Tylenol Extra Strength 500 Mg Tabs (Acetaminophen) .... As Needed For Pain  Allergies  (verified): No Known Drug Allergies  Past History:  Past medical, surgical, family and social histories (including risk factors) reviewed, and no changes noted (except as noted below).  Past Medical History: Current Problems:  MYOCARDIAL INFARCTION (ICD-410.90) OBESITY (ICD-278.00) HYPERLIPIDEMIA (ICD-272.4) #OBSTRUCTIVE SLEEP APNEA (ICD-327.23).Marland KitchenMarland KitchenDr Elsworth Soho  -Noncompliant with CPAP #GERD (ICD-530.81) #Hx of GI BLEEDING (ICD-578.9) #HYPERTENSION (ICD-401.9) #CORONARY ARTERY DISEASE (ICD-414.00) # "COPD" (ICD-496)   - No evidence of COPD PFTs Oct 2010 and spiro Feb 2011, CT Jan 2011 and Oct 2011 #PNEUMONIA  -  admit 03/13/2009 - 03/17/2009, CT abd lung cut 03/24/2009: Bilateral LL pneumonia #DYSPNEA  -Multifactorial -  Obesity  # ACUTE GASTROENTERITIS  -Admit 03/24/2009 - 03/29/2009 #NORMAL TSH  SEpt 2010 #PULMONARY NODULE  - Jan 2011 - Scattered in LLL 76mm  - Oct 2011 - No change  Past Surgical History: Reviewed history from 08/01/2009 and no changes required. stent x 2: 1996, 2008 cholectystectomy Back Surgery Colon Surgery Appendectomy  Family History: Reviewed history from 04/12/2009 and no changes required. 8 total siblings 1 Sister-COPD 3 siblings have sleep apnea 1 brother died age 84 with MI Mother-Htn, CAD Father- died at age 28 with brain aneurysm  Social History: Reviewed history from 08/01/2009 and no changes required. pt is a former smoker. started at age 63 to age 53.  3 ppd at time he quit in 29. pt is married and lives with  his wife.  1 daughter, 1 sonPack years:  76  Review of Systems       The patient complains of chest pain.  The patient denies shortness of breath with activity, shortness of breath at rest, productive cough, non-productive cough, coughing up blood, irregular heartbeats, acid heartburn, indigestion, loss of appetite, weight change, abdominal pain, difficulty swallowing, sore throat, tooth/dental problems, headaches, nasal  congestion/difficulty breathing through nose, sneezing, itching, ear ache, anxiety, depression, hand/feet swelling, joint stiffness or pain, rash, change in color of mucus, and fever.    Vital Signs:  Patient profile:   65 year old male Height:      74 inches Weight:      296.38 pounds BMI:     38.19 O2 Sat:      96 % on Room air Temp:     97.1 degrees F oral Pulse rate:   56 / minute BP sitting:   144 / 82  (left arm) Cuff size:   large  Vitals Entered By: [place] Bing CMA (May 01, 2010 3:17 PM)  O2 Flow:  Room air CC: Follow-up to discuss CT results. Pt c/o pain on left side of chest x 2 days.  Comments Medications reviewed with patient [place] Bing CMA  May 01, 2010 3:18 PM Daytime phone number verified with patient.    Physical Exam  General:  obese.   Head:  normocephalic and atraumatic Eyes:  PERRLA/EOM intact; conjunctiva and sclera clear Ears:  TMs intact and clear with normal canals Nose:  no deformity, discharge, inflammation, or lesions Mouth:  no deformity or lesionsMelampatti Class IV.  Melampatti Class IV.   Neck:  no masses, thyromegaly, or abnormal cervical nodes Chest Wall:  no deformities noted Lungs:  clear bilaterally to auscultation and percussiondecreased BS bilateral.  decreased BS bilateral.   Heart:  regular rate and rhythm, S1, S2 without murmurs, rubs, gallops, or clicks Abdomen:  obese soft no mass left flank is non tender normal bowel sounds Msk:  no deformity or scoliosis noted with normal posture Pulses:  pulses normal Neurologic:  CN II-XII grossly intact with normal reflexes, coordination, muscle strength and tone Skin:  intact without lesions or rashes Cervical Nodes:  no significant adenopathy Axillary Nodes:  no significant adenopathy Psych:  alert and cooperative; normal mood and affect; normal attention span and concentration   CT of Chest  Procedure date:  04/11/2010  Findings:      no change in nodules  66mm LLL since Jan 2011  Comments:      independently reviewed  Impression & Recommendations:  Problem # 1:  FLANK PAIN, LEFT (ICD-789.09) Assessment New I do not know what is causing this. D/w Dr. Alyson Ingles his primary care doc. Will get d-dimer tonight. If high, will get VQ scan. Set appt with him to see Dr. Noland Fordyce partner tomorrow 05/02/2010 . If worse, he will go to ER Orders: T- * Misc. Laboratory test [phone]) Radiology Referral (Radiology) Est. Patient Level IV [phone])  Problem # 2:  PULMONARY NODULE (ICD-518.89) Assessment: Unchanged  On CT Jan 2011, multiple small nodules pleural based.  On CT chest Oct 2011 - no chnage  plan repeat Ct Nov 2012 - almost 2 years Orders: Radiology Referral (Radiology) Est. Patient Level IV [id number])  Problem # 3:  COPD ([id number]) Assessment: Unchanged  PFTs Oct 2010  do not show COPD; it shows restrictin with low dlco. Likewise Feb 2011 shows restriction. CT Jan 2011 does not show emphysema. [place] desaturation test feb  2011 is normal.  In Nov 2011 no decompensation in dyspnea despite stopping mdi in Feb 2011. Walking desaturation test normal Nov 2011. Ct chest oct 2011 again shows no copd. Therefore, remove COPD as diagnosis.   plan monitor clinically  Medications Added to Medication List This Visit: 1)  Cpap  .... ** pt not using**  Patient Instructions: 1)  #lung nodule 2)    - stable since jan 2011 3)   - next ct scan nov 2012 4)  #shortness of breath 5)   - i do not think you have copd 6)   - this is due to obesity 7)  #left flank pain 8)    give blood test for d-dimer now 9)   see Dr. Wannetta Sender at 11.45 am at Dr Alyson Ingles office  10)   be at Dr Alyson Ingles office by 11/30am 11)   if you are worse tonight go to ER 12)  #FOLLOWUP 13)   - 1 year with CT chest   Immunization History:  Influenza Immunization History:    Influenza:  historical (04/17/2010)   Appended Document: F/U/MHH Ambulatory Pulse  Oximetry  Resting; HR____63_    02 Sat__95___  Lap1 (185 feet)   HR__70__   02 Sat__94___ Lap2 (185 feet)   HR___81__   02 Sat___96__    Lap3 (185 feet)   HR___90__   02 Sat__94___  _x__Test Completed without Difficulty ___Test Stopped due to:

## 2010-08-06 ENCOUNTER — Other Ambulatory Visit: Payer: Self-pay | Admitting: Gastroenterology

## 2010-08-12 ENCOUNTER — Ambulatory Visit
Admission: RE | Admit: 2010-08-12 | Discharge: 2010-08-12 | Disposition: A | Discharge status: Home / Self Care | Payer: PRIVATE HEALTH INSURANCE | Source: Ambulatory Visit | Attending: Gastroenterology | Admitting: Gastroenterology

## 2010-08-12 ENCOUNTER — Other Ambulatory Visit: Payer: Self-pay | Admitting: Gastroenterology

## 2010-08-19 ENCOUNTER — Ambulatory Visit
Admission: RE | Admit: 2010-08-19 | Discharge: 2010-08-19 | Disposition: A | Discharge status: Home / Self Care | Payer: PRIVATE HEALTH INSURANCE | Source: Ambulatory Visit | Attending: Gastroenterology | Admitting: Gastroenterology

## 2010-09-19 LAB — DIFFERENTIAL
Basophils Absolute: 0 10*3/uL (ref 0.0–0.1)
Basophils Absolute: 0 10*3/uL (ref 0.0–0.1)
Basophils Relative: 0 % (ref 0–1)
Basophils Relative: 0 % (ref 0–1)
Eosinophils Absolute: 0.5 10*3/uL (ref 0.0–0.7)
Eosinophils Absolute: 0.6 10*3/uL (ref 0.0–0.7)
Eosinophils Relative: 3 % (ref 0–5)
Eosinophils Relative: 4 % (ref 0–5)
Lymphocytes Relative: 16 % (ref 12–46)
Lymphocytes Relative: 23 % (ref 12–46)
Lymphs Abs: 2.7 10*3/uL (ref 0.7–4.0)
Lymphs Abs: 2.9 10*3/uL (ref 0.7–4.0)
Monocytes Absolute: 1.2 10*3/uL — ABNORMAL HIGH (ref 0.1–1.0)
Monocytes Absolute: 1.7 10*3/uL — ABNORMAL HIGH (ref 0.1–1.0)
Monocytes Relative: 10 % (ref 3–12)
Monocytes Relative: 9 % (ref 3–12)
Neutro Abs: 12.8 10*3/uL — ABNORMAL HIGH (ref 1.7–7.7)
Neutro Abs: 7.3 10*3/uL (ref 1.7–7.7)
Neutrophils Relative %: 62 % (ref 43–77)
Neutrophils Relative %: 71 % (ref 43–77)

## 2010-09-19 LAB — CBC
HCT: 38.9 % — ABNORMAL LOW (ref 39.0–52.0)
HCT: 39.1 % (ref 39.0–52.0)
HCT: 40.6 % (ref 39.0–52.0)
HCT: 41.2 % (ref 39.0–52.0)
HCT: 43.7 % (ref 39.0–52.0)
HCT: 46.4 % (ref 39.0–52.0)
Hemoglobin: 13.2 g/dL (ref 13.0–17.0)
Hemoglobin: 13.3 g/dL (ref 13.0–17.0)
Hemoglobin: 13.9 g/dL (ref 13.0–17.0)
MCHC: 34.1 g/dL (ref 30.0–36.0)
MCHC: 34.2 g/dL (ref 30.0–36.0)
MCHC: 34.2 g/dL (ref 30.0–36.0)
MCV: 89.2 fL (ref 78.0–100.0)
MCV: 89.6 fL (ref 78.0–100.0)
MCV: 89.7 fL (ref 78.0–100.0)
MCV: 90 fL (ref 78.0–100.0)
MCV: 90.1 fL (ref 78.0–100.0)
Platelets: 189 10*3/uL (ref 150–400)
Platelets: 221 10*3/uL (ref 150–400)
RBC: 4.33 MIL/uL (ref 4.22–5.81)
RBC: 4.51 MIL/uL (ref 4.22–5.81)
RBC: 4.57 MIL/uL (ref 4.22–5.81)
RBC: 4.88 MIL/uL (ref 4.22–5.81)
RBC: 5.42 MIL/uL (ref 4.22–5.81)
RDW: 13.1 % (ref 11.5–15.5)
RDW: 13.1 % (ref 11.5–15.5)
RDW: 13.3 % (ref 11.5–15.5)
WBC: 11.5 10*3/uL — ABNORMAL HIGH (ref 4.0–10.5)
WBC: 12.3 10*3/uL — ABNORMAL HIGH (ref 4.0–10.5)
WBC: 18 10*3/uL — ABNORMAL HIGH (ref 4.0–10.5)

## 2010-09-19 LAB — GLUCOSE, CAPILLARY
Glucose-Capillary: 120 mg/dL — ABNORMAL HIGH (ref 70–99)
Glucose-Capillary: 163 mg/dL — ABNORMAL HIGH (ref 70–99)
Glucose-Capillary: 97 mg/dL (ref 70–99)

## 2010-09-19 LAB — RENAL FUNCTION PANEL
Albumin: 2.8 g/dL — ABNORMAL LOW (ref 3.5–5.2)
BUN: 53 mg/dL — ABNORMAL HIGH (ref 6–23)
CO2: 25 mEq/L (ref 19–32)
CO2: 27 mEq/L (ref 19–32)
Calcium: 8.2 mg/dL — ABNORMAL LOW (ref 8.4–10.5)
Chloride: 105 mEq/L (ref 96–112)
Creatinine, Ser: 1.59 mg/dL — ABNORMAL HIGH (ref 0.4–1.5)
GFR calc Af Amer: 53 mL/min — ABNORMAL LOW (ref >60)
GFR calc non Af Amer: 44 mL/min — ABNORMAL LOW (ref >60)
Glucose, Bld: 107 mg/dL — ABNORMAL HIGH (ref 70–99)
Phosphorus: 3.9 mg/dL (ref 2.3–4.6)
Potassium: 4 mEq/L (ref 3.5–5.1)

## 2010-09-19 LAB — BASIC METABOLIC PANEL
BUN: 15 mg/dL (ref 6–23)
BUN: 36 mg/dL — ABNORMAL HIGH (ref 6–23)
BUN: 69 mg/dL — ABNORMAL HIGH (ref 6–23)
CO2: 24 mEq/L (ref 19–32)
CO2: 32 mEq/L (ref 19–32)
Calcium: 8.6 mg/dL (ref 8.4–10.5)
Calcium: 8.7 mg/dL (ref 8.4–10.5)
Calcium: 8.9 mg/dL (ref 8.4–10.5)
Chloride: 100 mEq/L (ref 96–112)
Chloride: 106 mEq/L (ref 96–112)
Chloride: 98 mEq/L (ref 96–112)
Creatinine, Ser: 3.13 mg/dL — ABNORMAL HIGH (ref 0.4–1.5)
Creatinine, Ser: 3.29 mg/dL — ABNORMAL HIGH (ref 0.4–1.5)
GFR calc Af Amer: 23 mL/min — ABNORMAL LOW (ref >60)
GFR calc Af Amer: 24 mL/min — ABNORMAL LOW (ref >60)
GFR calc Af Amer: 58 mL/min — ABNORMAL LOW (ref >60)
GFR calc non Af Amer: 19 mL/min — ABNORMAL LOW (ref >60)
GFR calc non Af Amer: 48 mL/min — ABNORMAL LOW (ref >60)
GFR calc non Af Amer: 51 mL/min — ABNORMAL LOW (ref >60)
GFR calc non Af Amer: 51 mL/min — ABNORMAL LOW (ref >60)
Glucose, Bld: 107 mg/dL — ABNORMAL HIGH (ref 70–99)
Glucose, Bld: 110 mg/dL — ABNORMAL HIGH (ref 70–99)
Glucose, Bld: 117 mg/dL — ABNORMAL HIGH (ref 70–99)
Potassium: 3.8 mEq/L (ref 3.5–5.1)
Potassium: 3.9 mEq/L (ref 3.5–5.1)
Potassium: 4.1 mEq/L (ref 3.5–5.1)
Potassium: 4.2 mEq/L (ref 3.5–5.1)
Sodium: 133 mEq/L — ABNORMAL LOW (ref 135–145)
Sodium: 138 mEq/L (ref 135–145)
Sodium: 141 mEq/L (ref 135–145)

## 2010-09-19 LAB — FECAL LACTOFERRIN, QUANT: Fecal Lactoferrin: POSITIVE

## 2010-09-19 LAB — PROTIME-INR
INR: 1.14 (ref 0.00–1.49)
Prothrombin Time: 14.5 seconds (ref 11.6–15.2)

## 2010-09-19 LAB — URINALYSIS, ROUTINE W REFLEX MICROSCOPIC
Hgb urine dipstick: NEGATIVE
Protein, ur: NEGATIVE mg/dL
Urobilinogen, UA: 0.2 mg/dL (ref 0.0–1.0)

## 2010-09-19 LAB — CARDIAC PANEL(CRET KIN+CKTOT+MB+TROPI)
Relative Index: 1.6 (ref 0.0–2.5)
Relative Index: INVALID (ref 0.0–2.5)
Total CK: 105 U/L (ref 7–232)
Total CK: 109 U/L (ref 7–232)

## 2010-09-19 LAB — LIPID PANEL
Cholesterol: 128 mg/dL (ref 0–200)
HDL: 27 mg/dL — ABNORMAL LOW (ref >39)
LDL Cholesterol: 70 mg/dL (ref 0–99)
Total CHOL/HDL Ratio: 4.7 RATIO
Triglycerides: 157 mg/dL — ABNORMAL HIGH (ref <150)
VLDL: 31 mg/dL (ref 0–40)

## 2010-09-19 LAB — POCT CARDIAC MARKERS
CKMB, poc: <1 ng/mL — ABNORMAL LOW (ref 1.0–8.0)
Myoglobin, poc: 228 ng/mL (ref 12–200)
Troponin i, poc: <0.05 ng/mL (ref 0.00–0.09)

## 2010-09-19 LAB — STOOL CULTURE

## 2010-09-19 LAB — CLOSTRIDIUM DIFFICILE EIA: C difficile Toxins A+B, EIA: NEGATIVE

## 2010-09-19 LAB — BRAIN NATRIURETIC PEPTIDE: Pro B Natriuretic peptide (BNP): 37 pg/mL (ref 0.0–100.0)

## 2010-09-19 LAB — MRSA PCR SCREENING: MRSA by PCR: NEGATIVE

## 2010-09-20 LAB — DIFFERENTIAL
Lymphocytes Relative: 27 % (ref 12–46)
Lymphs Abs: 2.9 10*3/uL (ref 0.7–4.0)
Monocytes Relative: 9 % (ref 3–12)
Neutrophils Relative %: 59 % (ref 43–77)

## 2010-09-20 LAB — LIPID PANEL
Cholesterol: 180 mg/dL (ref 0–200)
Cholesterol: 182 mg/dL (ref 0–200)
HDL: 26 mg/dL — ABNORMAL LOW (ref >39)
HDL: 31 mg/dL — ABNORMAL LOW (ref >39)
LDL Cholesterol: 135 mg/dL — ABNORMAL HIGH (ref 0–99)
Total CHOL/HDL Ratio: 7 RATIO
Triglycerides: 106 mg/dL (ref <150)

## 2010-09-20 LAB — COMPREHENSIVE METABOLIC PANEL
ALT: 32 U/L (ref 0–53)
Alkaline Phosphatase: 60 U/L (ref 39–117)
CO2: 28 mEq/L (ref 19–32)
CO2: 30 mEq/L (ref 19–32)
Calcium: 9.1 mg/dL (ref 8.4–10.5)
Calcium: 9.2 mg/dL (ref 8.4–10.5)
Creatinine, Ser: 1.09 mg/dL (ref 0.4–1.5)
GFR calc Af Amer: >60 mL/min (ref >60)
GFR calc non Af Amer: >60 mL/min (ref >60)
GFR calc non Af Amer: >60 mL/min (ref >60)
Glucose, Bld: 135 mg/dL — ABNORMAL HIGH (ref 70–99)
Glucose, Bld: 96 mg/dL (ref 70–99)
Sodium: 139 mEq/L (ref 135–145)
Total Protein: 8 g/dL (ref 6.0–8.3)

## 2010-09-20 LAB — BASIC METABOLIC PANEL
CO2: 29 mEq/L (ref 19–32)
Glucose, Bld: 144 mg/dL — ABNORMAL HIGH (ref 70–99)
Potassium: 4.3 mEq/L (ref 3.5–5.1)
Sodium: 138 mEq/L (ref 135–145)

## 2010-09-20 LAB — CBC
HCT: 44.8 % (ref 39.0–52.0)
Hemoglobin: 15.2 g/dL (ref 13.0–17.0)
Hemoglobin: 15.9 g/dL (ref 13.0–17.0)
MCHC: 34 g/dL (ref 30.0–36.0)
MCHC: 34.3 g/dL (ref 30.0–36.0)
MCV: 88.5 fL (ref 78.0–100.0)
MCV: 89 fL (ref 78.0–100.0)
MCV: 89.2 fL (ref 78.0–100.0)
Platelets: 222 10*3/uL (ref 150–400)
RBC: 4.99 MIL/uL (ref 4.22–5.81)
RBC: 5.03 MIL/uL (ref 4.22–5.81)
RBC: 5.25 MIL/uL (ref 4.22–5.81)
RDW: 13.7 % (ref 11.5–15.5)
WBC: 16.2 10*3/uL — ABNORMAL HIGH (ref 4.0–10.5)

## 2010-09-20 LAB — GLUCOSE, CAPILLARY
Glucose-Capillary: 123 mg/dL — ABNORMAL HIGH (ref 70–99)
Glucose-Capillary: 137 mg/dL — ABNORMAL HIGH (ref 70–99)
Glucose-Capillary: 148 mg/dL — ABNORMAL HIGH (ref 70–99)
Glucose-Capillary: 152 mg/dL — ABNORMAL HIGH (ref 70–99)
Glucose-Capillary: 164 mg/dL — ABNORMAL HIGH (ref 70–99)
Glucose-Capillary: 326 mg/dL — ABNORMAL HIGH (ref 70–99)

## 2010-09-20 LAB — BLOOD GAS, ARTERIAL
Acid-Base Excess: 3.6 mmol/L — ABNORMAL HIGH (ref 0.0–2.0)
Bicarbonate: 27.6 mEq/L — ABNORMAL HIGH (ref 20.0–24.0)
O2 Saturation: 91.4 %
Patient temperature: 98.6
pO2, Arterial: 58.6 mmHg — ABNORMAL LOW (ref 80.0–100.0)

## 2010-09-20 LAB — CULTURE, RESPIRATORY W GRAM STAIN

## 2010-09-20 LAB — CARDIAC PANEL(CRET KIN+CKTOT+MB+TROPI)
CK, MB: 1.7 ng/mL (ref 0.3–4.0)
CK, MB: 1.9 ng/mL (ref 0.3–4.0)
Relative Index: 1.8 (ref 0.0–2.5)
Total CK: 101 U/L (ref 7–232)
Troponin I: 0.02 ng/mL (ref 0.00–0.06)

## 2010-09-20 LAB — CK TOTAL AND CKMB (NOT AT ARMC): Total CK: 113 U/L (ref 7–232)

## 2010-09-20 LAB — POCT CARDIAC MARKERS
CKMB, poc: <1 ng/mL — ABNORMAL LOW (ref 1.0–8.0)
Myoglobin, poc: 60 ng/mL (ref 12–200)
Troponin i, poc: <0.05 ng/mL (ref 0.00–0.09)

## 2010-09-20 LAB — EXPECTORATED SPUTUM ASSESSMENT W GRAM STAIN, RFLX TO RESP C

## 2010-10-29 NOTE — H&P (Signed)
[name], [name] NO.:  [id number]   MEDICAL RECORD NO.:  [id number]          PATIENT TYPE:  EMS   LOCATION:  [facility]                         FACILITY:  [facility]   PHYSICIAN:  [name]., M.D.DATE OF BIRTH:  December 19, 1945   DATE OF ADMISSION:  01/26/2007  DATE OF DISCHARGE:                              HISTORY & PHYSICAL   REASON FOR ADMISSION:  Acute lower GI bleed.   HISTORY:  A nice 65 year old white male who had a colonoscopy done 5  days ago by Dr. Penelope Lane at the [facility] with removal of two  polyps.  Currently those records are not available and the location of  polyps is unknown.  He did fine with no abdominal pain, bleeding, eating  a regular diet having, normal bowel movements until early this morning.  He went to bed last night after a good dinner, he felt fine.  He woke up  around 3 a.m. with cramping, he thought he had a bug, went to the  bathroom, had diarrhea that became grossly bloody, had another bloody  bowel movement a short distance later, and was told by myself to come to  the emergency room.  He has had two bloody bowel movements in the  emergency room.  I-STAT obtained in the emergency room revealed a  hemoglobin of 15.4.  He currently feels okay and has gotten some IV  fluids and he feels better.   CURRENT MEDICATIONS:  Aspirin and Plavix.  These were on hold prior to  and remain on hold following his colonoscopy; Crestor; several blood  pressure medicines and heart medicines, the names of which he does not  know; and some sort of gout medicine.  The medication list and bottles  were not brought with him to the emergency room, so these medications  are unknown at this time.   He has no drug allergies.   MEDICAL HISTORY:  1. He has had coronary disease with an MI x2.  He has four stents in      his heart, placed by Dr. Wynonia Lane.  2. History of high cholesterol and blood pressure problems.  3. History of chronic right leg  pain dating back to an injury as a      youth, with chronic swelling and pain in the right leg.  4. Back syndrome causing disability with previous back surgery causing      chronic pain.  5. Status post appendectomy and cholecystectomy.   FAMILY HISTORY:  Noncontributory.   SOCIAL HISTORY:  He is disabled due to his back up.  Previously worked  as a heavy truck Engineer, building services.  Does not smoke or drink.  He is  married.   REVIEW OF SYSTEMS:  Remarkable primarily as noted above.  It is notable  that he has had no chest pain, shortness of breath or any other cardiac  symptoms over the past 6 or 7 hours.   PHYSICAL EXAM:  Temperature 97, pulse 57, blood pressure 145/79.  GENERAL:  Obese white male, alert and oriented x3.  He is talkative and  appears in  no obvious distress.  Eyes:  Sclerae are nonicteric.  Extraocular movements fully intact.  Throat is normal.  Mucous membranes seem a bit dry.  HEART:  Regular rate and rhythm without murmurs or gallops.  LUNGS:  Clear.  ABDOMEN:  Obese, soft, and totally nontender with markedly increased  bowel sounds.  RECTAL:  Not performed.  The patient had a bedpan full bloody stool by  the bed.   ASSESSMENT:  1. Lower gastrointestinal bleed, probably due to polypectomy.  The      patient seems currently stable and, hopefully, this will stop      without requiring any intervention.  If not, he will need a      colonoscopy with an attempt to find the polypectomy sites and      inject or clip them.  2. Coronary disease, status post myocardial infarction with current      stents aspirin, Plavix and remain currently on hold.   PLAN:  1. Will admit, observe, transfuse as needed, colonoscopy with      treatment if needed.  2. Will obtain records regarding his medications from our office.           ______________________________  [name]. Rolla Lane., M.D.     [name]  D:  01/26/2007  T:  01/27/2007  Job:  [id number]   cc:   [name],  M.D.  [name], M.D.  [name], M.D.

## 2010-10-29 NOTE — Op Note (Signed)
NAMEREO, MAYHUE NO.:  [id number]   MEDICAL RECORD NO.:  [id number]          PATIENT TYPE:  INP   LOCATION:  [id number]                         FACILITY:  [facility]   PHYSICIAN:  Lear Ng, MDDATE OF BIRTH:  January 08, 1946   DATE OF PROCEDURE:  01/26/2007  DATE OF DISCHARGE:                               OPERATIVE REPORT   PROCEDURE PERFORMED:  Colonoscopy.   INDICATIONS:  Rectal bleeding in the setting of recent colonoscopy with  polypectomy last week.   MEDICATIONS:  Fentanyl 50 mcg IV, Versed 4 mg IV, epinephrine injection  10 mL of 1:10,000 units.   FINDINGS:  The pediatric Pentax colonoscope was inserted to the  unprepped colon where a large amount of blood and blood clots were noted  in the colon.  The colonoscope was advanced with difficulty to the cecum  but the appendiceal orifice could not be identified due to excessive  looping.  The ileocecal valve was identified and due to patient  discomfort and excessive looping, the colonoscope could not be advanced  into the base of the cecum.  Abdominal pressure was applied and the  patient was turned into the supine position and still I was unable to  fully intubate the cecum.  On careful withdrawal of the colonoscope  revealed bright red blood clots and dark colored clots throughout the  colon from the hepatic flexure down to the rectum.  The proximal colon  and the ascending colon showed what blood products but no large clot  seen.  In the mid transverse, there was an area of bright red blood with  clots fully in one particular wall.  Upon excessive irrigation of this  area, the polypectomy site was visualized.  There was a clot adherent to  that site and bright red blood extruding from the clot.  An attempt was  made to place a hemoclip on to this clot, but the hemoclip dislodged the  clot revealing the polypectomy ulcer underneath and active oozing of  blood.  At that point 6 mL of 1:10,000 units  epinephrine were injected  in and around this ulcer with decrease in the oozing of blood.  BICAP  Gold probe cautery was then used to cauterize the area and significant  decrease in bleeding was noted.  Due to a small amount of continued  oozing, two hemoclips were then placed at the site with complete  hemostasis.  4 mL of epinephrine were then injected after hemoclip  placement for adequate blanching of this area.  On careful withdrawal of  the colonoscope, a large amount of blood products were seen in the left  sided colon preventing adequate visualization of the left side of the  colon.  Left sided sigmoid diverticula were noted.  The sigmoid  polypectomy site could not be identified due to the large amount of  blood products in the colon.   ASSESSMENT:  1. Post polypectomy bleed (at transverse colon polypectomy site).  2. Status post epinephrine injection and cautery as well as hemoclip      placement.  3. Adequate hemostasis achieved.   PLAN:  1. Transfer to step down ICU.  2. Clear liquid diet.  3. Continue MiraLax to flush out remaining blood.  4. Continue IV fluids and transfuse as needed.      Lear Ng, MD  Electronically Signed     VCS/MEDQ  D:  01/26/2007  T:  01/27/2007  Job:  ID:[id number]   cc:   Ezzard Standing, M.D.  Wonda Horner, M.D.

## 2010-10-29 NOTE — Consult Note (Signed)
[name], RIGONI NO.:  [id number]   MEDICAL RECORD NO.:  [id number]          PATIENT TYPE:  INP   LOCATION:  3707                         FACILITY:  [facility]   PHYSICIAN:  Ezzard Standing, M.D.DATE OF BIRTH:  11/01/1945   DATE OF CONSULTATION:  01/26/2007  DATE OF DISCHARGE:                                 CONSULTATION   CARDIAC CONSULTATION   HISTORY:  Thank you for asking me to see the 65 year old male for  evaluation of bradycardia.  The patient has a history of coronary artery  disease; and had stenting of a circumflex in June of 1998.  He had  stenting of the LAD in August of 2007 and the circumflex at that time  and had 50% mid right coronary artery lesions noted.  He was stable and  was seen by me recently in April, at which point, he was stable without  recurrence of chest discomfort.  He has remained obese, and his blood  pressure was not well controlled at the time.   He had a colonoscopy, last week, with removal of polyps; and has been  off of Plavix and aspirin since the beginning of the month.  He then  developed recurrent gastrointestinal bleeding, this morning, with  hematochezia; and was brought to the hospital.  He was found to be  bradycardic.  Atropine was kept at the bedside; and I was asked to see  him.  The patient complains of feeling weak when he stands up; and has  had about 11 bloody bowel movements.  He has not had any shortness of  breath of significance; and denies any recurrence of chest pain.  He was  noted to be bradycardic; and have some nonspecific changes in the  anterior leads.   PAST HISTORY REMARKABLE FOR:  1. Hyperlipidemia.  2. Severe obesity.  3. Severe lumbar disk disease with previous laminectomy.  4. History of peptic ulcer disease, previously.  5. Hypertension, which has been poorly controlled.   PREVIOUS SURGERIES:  1. Appendectomy.  2. Laparoscopic cholecystectomy.  3. Lumbar laminectomy.   ALLERGIES:   None.   MEDICATIONS PRIOR TO ADMISSION:  1. Amlodipine 10 mg daily.  2. Coenzyme Q-10 daily.  3. Crestor 10 mg every other day.  4. Hydrochlorothiazide/lisinopril 12.5/20 mg daily.  5. Toprol 50 mg daily.  6. He previously had been on Plavix and aspirin, but is currently off      of them.   SOCIAL HISTORY:  He lives with his wife and is married.  He has no  alcohol use.  He quit smoking in 1985.  He is a disabled Haematologist.   REVIEW OF SYSTEMS:  Significant obesity.  Denies eye problems.  He has a  history of laryngospasm and negative pressure pulmonary edema after  cholecystectomy several years ago.  History of gastric ulcer disease and  ERCP previously.  He has a history of spinal stenosis, sciatica, and  arthritis of his right shoulder.   PHYSICAL EXAMINATION:  VITAL SIGNS:  He is an obese, elderly male who is  currently  in no acute distress.  Blood pressure is 110/60, pulse was  currently 48.  SKIN:  Warm and dry.  ENT:  EOMI.  PERLA.  CENTRAL NERVOUS SYSTEM:  Clear.  NECK:  Supple without masses.  No JVD, no thyromegaly.  LUNGS:  Clear.  CARDIAC EXAM:  Normal S1 and S2.  No S3.  ABDOMEN:  Soft, nontender.  Distal pulses 2+.  Blood pressure 110/80  standing.   IMPRESSION:  1. Bradycardia likely due to his previous beta blocker therapy.  He      may also have some element of vasovagal problems due to his GI      bleeding.  2. Orthostatic hypotension.  3. Gastrointestinal bleeding due to polyps.  4. Coronary artery disease with a previous nondrug eluting stent of      the LAD and circumflex.  5. Hypertensive heart disease.  6. Morbid obesity.   RECOMMENDATIONS:  The patient has active GI bleeding going on right now.  He has not a nondrug eluting stent placed about a year ago; and he may  be off of aspirin and Plavix until the bleeding is controlled.  I would  hold his beta blocker at present time, and vigorously replete his  volume  status as well as his CBC.      Ezzard Standing, M.D.  Electronically Signed     WST/MEDQ  D:  01/26/2007  T:  01/26/2007  Job:  [id number]   cc:   Lear Ng, MD

## 2010-11-01 NOTE — Discharge Summary (Signed)
   NAME:  [name], [name]                         ACCOUNT NO.:  [id number]   MEDICAL RECORD NO.:  [id number]                   PATIENT TYPE:  INP   LOCATION:  3019                                 FACILITY:  [facility]   PHYSICIAN:  Earleen Newport, M.D.               DATE OF BIRTH:  03-16-1946   DATE OF ADMISSION:  08/15/2002  DATE OF DISCHARGE:  08/21/2002                                 DISCHARGE SUMMARY   ADMISSION DIAGNOSIS:  Lumbar spondylosis and stenosis with right lumbar  radiculopathy.   OPERATIONS:  Lumbar laminectomy at L4-5, posterior lumbar interbody  arthrodesis with Synthes bone spacers and nonsegmental fixation of L4-5 with  pedicle screws, and posterolateral arthrodesis using allograft and  autograft.   DISCHARGE DIAGNOSES:  1. Lumbar spondylosis and stenosis with right lumbar radiculopathy.  2. Acute renal failure postoperatively.  3. Paralytic ileus postoperatively.  4. Chronic hypertension.   CONSULTATIONS:  Court Joy, M.D., [facility].   CONDITION ON DISCHARGE:  Improving.   HOSPITAL COURSE:  The patient is a 65 year old individual who has had  significant back and lower extremity pain, particularly in the right leg.  He was admitted to the hospital to undergo surgical decompression and  arthrodesis.  This was carried out on August 15, 2002.  Postoperatively, the  patient initially felt well and then felt quite poorly with nausea and  vomiting.  He was felt to have a paralytic ileus.  Laboratories demonstrated  that he had a significant bump in his creatinine from 1.3 to 2.8.  He  maintained urine output.  He was given IV hydration and his  antihypertensives were stopped.  This improved, however, we obtained  consultation with Court Joy, M.D., from [facility] to  evaluate this in the hospital.  Medications were switched around.  At the  current time, the patient is off of his antihypertensives.  He will be seen  as an  outpatient in approximately two weeks' time for further follow-up by  me and he will be seen by his primary care physician as needed subsequently.  The incision is clean and dry.   CONDITION ON DISCHARGE:  Improving.                                               Earleen Newport, M.D.    Drucilla Schmidt  D:  08/21/2002  T:  08/22/2002  Job:  [id number]

## 2010-11-01 NOTE — Cardiovascular Report (Signed)
[name], [name] NO.:  [id number]   MEDICAL RECORD NO.:  [id number]          PATIENT TYPE:  INP   LOCATION:  2039                         FACILITY:  [facility]   PHYSICIAN:  Ezzard Standing, M.D.DATE OF BIRTH:  [birth year]   DATE OF PROCEDURE:  01/29/2006  DATE OF DISCHARGE:                              CARDIAC CATHETERIZATION   HISTORY:  A 65 year old male with previous stent of the circumflex coronary  artery in 1998 that has done well long-term.  He presented to the emergency  room with a prolonged episode of chest pain and had positive troponins  consistent with unstable angina.   PROCEDURE:  Left heart catheterization with coronary angiograms, left  ventriculogram, nondrug-eluting stent placed in the proximal LAD and the  circumflex marginal branch.   DESCRIPTION OF PROCEDURE:  The patient was brought to the catheterization  laboratory and prepped and draped in the usual manner.  After Xylocaine  anesthesia, a 6-French sheath was placed a right femoral artery with a  single anterior needle wall stick.  Angiograms were made using 6-French  catheters, and a 30-mL ventriculogram was performed.  He was given fentanyl  for severe back pain during the procedure in multiple doses.  He also  required 40 mg of labetalol for control of hypertension which was severe at  the beginning of the procedure, and a nitroglycerin drip was started.  ACT  was measured and was subtherapeutic.  Arrangements were made to do  intervention.  He was given 300 mg of Plavix as well as some mild Zofran for  nausea.  The guiding catheter used was a 7-French JL-4 guiding catheter, HTF-  J guidewire was advanced down the LAD.  ACT was measured and was 351.  Integrilin was begun with double bolus and constant infusion.  The guidewire  crossed the LAD lesion easily, and the lesion was predilated with a 2.5 x 12-  mm Maverick balloon to 6 atmospheres.  A 3.0 x 12-mm Vision stent was placed  across the LAD lesion and dilated to 12 atmospheres and then postdilated  with a 3.25 x 8-mm Quantum balloon to 12 atmospheres.  He tolerated this  well, except he had significant back pain throughout the procedure.  Attention was then turned to the circumflex.  The same guidewire was then  redirected down the circumflex, and the lesion was predilated with a 2.5 x  12-mm Maverick balloon that was used previously.  A 2.75 x 15-mm Vision  stent was then deployed to 12 atmospheres and postdilated with a 2.75 x 12-  mm Quantum balloon.  He had an excellent angiographic result following this.  His blood pressure came under better control, and following this, a right  femoral angiogram was obtained, and the patient underwent Angio-Seal closure  of the right femoral artery.  He eventually tolerated the procedure well.   HEMODYNAMIC DATA:  Aorta postcontrast 189/100, LV postcontrast 189/19-25.   ANGIOGRAPHIC DATA:  Left ventriculogram:  Performed in the 30-degree RAO  projection.  The aortic valve was normal.  The mitral valve was normal.  The  left ventricle appeared  normal in size.  LVH was noted.  Estimated ejection  fraction was 60%.  Coronary arteries arises and distributes normally.  The  left main coronary artery was normal.  Left anterior descending has a hazy  70% eccentric proximal stenosis prior to the septal perforator.  The vessel  terminates in the distal anterior wall.  The circumflex coronary artery  stent that was previously placed was widely patent.  There was a more distal  90-95% stenosis in the mid portion of the marginal branch.  The right  coronary artery was a large dominant vessel.  The posterior descending  supplied the apex.  There was a mid vessel 50-60% stenosis which was thought  to be borderline significant.  Post dilatation angiograms of the LAD  stenosis showed 0% residual stenosis and preservation of all side branches.  The circumflex had 0% residual stenosis.    IMPRESSION:  1. Successful stenting with nondrug-eluting stents of the proximal LAD and      the mid circumflex marginal branch.  2. Residual coronary disease involving the right coronary artery.   RECOMMENDATIONS:  Intensive risk factor modification, need to lose weight.      Ezzard Standing, M.D.  Electronically Signed     WST/MEDQ  D:  01/29/2006  T:  01/29/2006  Job:  [id number]

## 2010-11-01 NOTE — Discharge Summary (Signed)
NAMELUCIAN, SCHUENKE NO.:  [id number]   MEDICAL RECORD NO.:  [id number]          PATIENT TYPE:  INP   LOCATION:  2036                         FACILITY:  [facility]   PHYSICIAN:  Melton Alar, PA    DATE OF BIRTH:  05-18-1946   DATE OF ADMISSION:  01/26/2007  DATE OF DISCHARGE:  01/28/2007                               DISCHARGE SUMMARY   ADMISSION DIAGNOSIS:  Hematochezia.   DISCHARGE DIAGNOSES:  1. GI bleed status post polypectomy.  2. Coronary artery disease.  3. Lumbar disk disease.  4. Obesity.  5. Hypertension.  6. Hyperlipidemia.  7. History of peptic ulcer disease.   SURGICAL PROCEDURES:  1. Coronary artery stent.  2. Appendectomy.  3. Cholecystectomy.  4. Lumbar laminectomy status post ERCP.   SERVICE:  Gastroenterology.   CONSULTS:  Dr. Tollie Eth who saw the patient on August 12 for  bradycardia.   PROCEDURES:  Colonoscopy on August 12 by Dr. Wilford Corner.   HISTORY AND PHYSICAL EXAM:  Mr. Buccieri is a 65 year old white male who  had a colonoscopy done on approximately August 7 at the [facility] with removal of two polyps.  He did fine with no abdominal pain  or bleeding.  He was eating a regular diet, and had a normal bowel  movement until early morning of August 12.  He woke up at approximately  3:00 a.m. with cramping, went to the bathroom and had diarrhea that  became grossly bloody.  Prior to admission, he had approximately seven  bloody bowel movements.  He was told by Dr. Laurence Spates to go to the  [facility] emergency room where he was found to have a hemoglobin of  15.4.  He felt okay at the time and was given IV fluids and admitted.   HOSPITAL COURSE:  On August 12, Mr. Frantom underwent colonoscopy by Dr.  Wilford Corner.  Colonoscopy results revealed:  1. Post polypectomy bleed at the transverse colon polypectomy site.  2. Status post epinephrine injection and cautery as well as hemoclip       placement.  3. Adequate hemostasis achieved.   The patient was then transferred to the step-down unit in ICU, was given  a clear liquid diet and more IV fluids.  He was seen by Dr. Tollie Eth on August 12 for bradycardia.  At that time, he had a pulse of  approximately 49.  Dr. [name] recommended that he stay off aspirin and  Plavix which she has been taking for his non drug-eluting stent.  He  also recommended that his beta blocker be held and that we vigorously  replete his volume status and check his CBC.  On August 13, the patient  was transferred from the step-down unit back to a regular bed on  telemetry.  His diet was slowly advanced from clear liquids to full  regular solid diet, and he was found to have dark bowel movements at  that time that appeared to be bloody.  On August 14, the patient had  improved.  His bleeding had resolved.  He was  tolerating a full diet,  able to ambulate well in the hallway.  Labs on August 14 were:  Hemoglobin 10.2, hematocrit 29.6, white count 6.9, platelets 185.  His B-  met done on August 13 showed sodium 139, potassium 3.4, chloride 106,  bicarb 27 glucose 23, BUN 12, creatinine 0.91.  The patient was  discharged to home on the evening of August 14 at approximately 5:00  p.m.  He was in good condition.   DISCHARGE MEDICATIONS:  He was told to resume all of his medications  that he was taking prior to admission with the exception of Plavix which  he is to hold indefinitely, aspirin which he is to hold for one week.  Previous medications included amlodipine 10 mg daily, coenzyme Q10,  Crestor 10 mg every other day, HCTZ 12.5, lisinopril 20, Toprol 50 mg.  He was instructed to follow up with Dr. Acquanetta Sit in approximately 3  weeks and to call the [facility] if he noticed any more rectal  bleeding, felt particularly weak or dizzy      Melton Alar, [place]     MLY/MEDQ  D:  01/31/2007  T:  02/01/2007  Job:  [id number]   cc:   Wonda Horner, M.D.  Ezzard Standing, M.D.  Robert A. Alyson Ingles, M.D.

## 2010-11-01 NOTE — Op Note (Signed)
NAME:  [name], [name]                         ACCOUNT NO.:  [id number]   MEDICAL RECORD NO.:  [id number]                   PATIENT TYPE:  INP   LOCATION:  3172                                 FACILITY:  [facility]   PHYSICIAN:  Earleen Newport, M.D.               DATE OF BIRTH:  Aug 06, 1945   DATE OF PROCEDURE:  08/15/2002  DATE OF DISCHARGE:                                 OPERATIVE REPORT   PREOPERATIVE DIAGNOSIS:  L4-L5 spondylosis and stenosis with right lumbar  radiculopathy.   POSTOPERATIVE DIAGNOSIS:  L4-L5 spondylosis and stenosis with right lumbar  radiculopathy.   OPERATION:  1. Lumbar laminectomy L4-L5.  2. Diskectomy L4-5.  3. Posterior lumbar interbody arthrodesis with Synthesis bone spacers and     nonsegmental fixation L4-L5 with pedicles and posterior lateral     arthrodesis with local Autograft and Allograft.   SURGEON:  Earleen Newport, M.D.   FIRST ASSISTANT:  Ashok Pall, M.D.   ANESTHESIA:  General endotracheal.   INDICATIONS FOR PROCEDURE:  The patient is a 65 year old individual who has  had significant back and right lower extremity pain particularly.  He had a  work related incident marking the onset of these problems.  He was found to  have severe stenotic disease at the L4-5 level.  He was taken to the  operating room.   DESCRIPTION OF PROCEDURE:  The patient was brought to the operating room,  supine on the stretcher.  After the smooth induction of general endotracheal  anesthesia, he was turned prone and the back was shaved, prepped with  DuraPrep and a draped in a sterile fashion.  A midline incision was created  and carried down to the lumbar dorsal fascia which was opened on either side  of the midline to expose the spinous process of L3, L4 and L5.  After  positively identifying L4 with a radiograph, dissection was then carried out  in the subperiosteal layer to expose the spinous process and the laminar  arch of L4, L5 and the inferior  margin of L3.  Self retaining McCullough  retractor was placed in the wounds.  The facets at L4-5 were then  identified.  They were noted to be externally hypertrophied.  This  hypertrophied mass was then taken down to expose the facet joint itself.  Laminectomy was then created removing marginal lamina up to and including  the mesial wall of the facet.  The dissection was then carried out on either  side to expose the common dural tube and the common sac.  As the dissection  was carried down along the right side, there was noted to be a significant  mass of disc behind the body of L5 compromising the L5 nerve superiorly and  medially.  The disc was in the region of the foramen itself.  By gently  retracting the nerve roots, the ligament over the disc space itself could  be  incised and a singular moderate size fragment of disc was removed.  This  immediately allowed for good decompression of the L5 nerve root in the  foramen.  Dissection was then carried further to expose and retract  ligamentous material.  It was resected medially.  The dissection was then  carried further medially and the disc space was entered and a combination of  curets and rongeurs were used to evacuate the disc space.  There was  significantly quantity of markedly degenerated disc material.  The  interspace was then cleared completely of all the disc material.  From the  left side then a diskectomy was similarly performed and the disc space was  evacuated of a marked quantity of significantly degenerated disc material.  With the disc space being completely emptied, care was taken to retract the  dura to either side and then placed a self retaining retractor on one side,  while the interspace was decorticated superiorly and inferiorly.  Then a  trial spacer of 11 mm bone spacer was placed and it was found to be snug.  An 11 mm bone graft was then placed into the interspace first on the right  side and then on the left  side.  Prior to doing this, Autograft had been  harvested from the laminectomy itself and Allograft which was Vitoss mixed  with the patient's own blood that was obtained from the laminectomy site and  pedicle entry sites was mixed together and this was packed into the  interspace ventrally.  The grafts were then placed and the remainder of the  bone was placed into the interspace to fill it.  Pedicle entry sites were  then chosen in L4 and L5 and these were marked with the probes.  Localizing  radiograph identified the position of the probes within the vertebra.  Small  adjustments were made and then a 6.25 x 15 mm screw was placed in the  pedicles at L4 and 6.2 x 15 mm screw was placed in the pedicles at L5.  The  lateral margins were then decorticated out to the inner transverse spaces at  L4 and L5.  Mixture of Autograft and Allograft was packed into these  recesses and then screw caps were applied.  A short straight rod was placed  between these and the rod caps were applied.  The superior most screws were  tightened and then while applying compression inferior screws were  tightened.  Final localizing radiograph identified a good lordotic construct  with the interbody graft being nicely placed.  The remainder of the graft  was placed into the lateral gutters and tamponaded soundly.  Hemostasis was  then achieved in the lumbodorsal fascia and the common dural tube and then  the area was copiously irrigated with Kanamycin irrigation solution.  Lumbodorsal fascia was closed with #1 Vicryl interrupted fashion, 2-0 Vicryl  was used subcutaneously, 3-0 Vicryl subcuticularly.  Dermabond was placed on  the skin.  Blood loss was estimated at 750 cc.                                               Earleen Newport, M.D.    Drucilla Schmidt  D:  08/15/2002  T:  08/15/2002  Job:  [id number]

## 2010-11-01 NOTE — H&P (Signed)
NAMEROWNAN, KENAN NO.:  [id number]   MEDICAL RECORD NO.:  [id number]          PATIENT TYPE:  EMS   LOCATION:  [facility]JO                         FACILITY:  [facility]   PHYSICIAN:  Darlin Coco, M.D. DATE OF BIRTH:  [birth year]   DATE OF ADMISSION:  01/28/2006  DATE OF DISCHARGE:                                HISTORY & PHYSICAL   CHIEF COMPLAINT:  Chest pain.   HISTORY OF PRESENT ILLNESS:  This is a 65 year old married Caucasian  gentleman admitted with chest pain radiating to the left arm.  He had the  onset of the pain about 4:30 this afternoon.  The patient also had a similar  episode the previous evening relieved by Nexium.  Tonight, he did not  experience any relief with Nexium or with aspirin and summoned 911 and EMS  gave him a spray of nitroglycerin which did not help and then he was given  morphine which seemed to help.  The onset of the pain occurred while he was  trying to change a tire on his truck in 90-degree heat.  He was diaphoretic  but attributed that to the heat.  There was no nausea or vomiting.  The  patient does have known coronary artery disease and had cardiac  catheterization with a stent by Dr. [name] in about 1995.  I do not have  those details at this point.  The patient states he has not seen Dr. [name]  or any other doctor for the past three years or more.  He has stopped taking  all of his medicines except for a baby aspirin daily.   PAST MEDICAL HISTORY:  Hypertension, hypercholesterolemia.  He is not  diabetic.   FAMILY HISTORY:  Positive for heart problems.  His mother is alive at age 29  and has had bypass surgery.  His father died of a stroke.  He had a brother  who weighed 400 pounds who died of a massive heart attack at age 79.   SOCIAL HISTORY:  Reveals that he is married and has 2 children and 6  grandchildren.  He has not used alcohol or tobacco.  He has been on  disability for his back since 2005 and is covered by  Medicare.  Dr. Ellene Route  is his neurosurgeon.   REVIEW OF SYSTEMS:  Reveals that he does have a past history of ulcers which  have bleed in the remote past but not recently.  He has to be careful how  much aspirin he uses because of his ulcers.  He does use his wife's Nexium  on a p.r.n. basis.  Genitourinary history reveals that he has nocturia x6,  and his family has been concerned that he might have some prostate trouble.  He has not seen a urologist.  He denies any cough or sputum production.   PAST SURGICAL HISTORY:  Cholecystectomy, back surgery and appendicitis.   REVIEW OF SYSTEMS:  Remainder of review of systems is negative in detail.   PHYSICAL EXAMINATION:  VITAL SIGNS:  Blood pressure 162/80.  Pulse 60.  Respirations normal.  Weight is approximately 295  pounds by his estimate.  HEENT:  Head and neck exam is unremarkable.  Pupils are equal.  Sclerae are  clear.  Mouth and pharynx negative.  Jugular venous pressure normal.  Carotids normal.  Thyroid normal.  CHEST:  Clear.  HEART:  No murmur, gallop, rub or click.  Chest is very thick walled.  Heart  sounds are distant.  ABDOMEN:  Obese and nontender.  No mass.  EXTREMITIES:  No phlebitis.  He does have stasis dermatitis of his legs.  He  has 1+ pedal pulses.   His electrocardiogram shows normal sinus rhythm, nonspecific T-wave changes.  No acute changes.  Chest x-ray is normal.   INITIAL LABS:  Blood sugar 121.  Sodium 140, potassium 4.0, BUN 22,  creatinine 1.2.  CK-MB point of care 3.3, 4.7 and 4.4.  Troponins point of  care 0.6, 0.18 and 0.14.  B natriuretic peptide is normal at 53.   IMPRESSION:  1. Chest pain, rule out myocardial infarction.  2. Hypertensive cardiovascular disease.  3. History of untreated hypercholesterolemia secondary to poor patient      compliance.  4. Exogenous obesity.   DISPOSITION:  We are going to admit to [facility] to telemetry.  We will  get serial CK-MBs and troponin I's.  We  will treat with IV heparin and IV  nitroglycerin, aspirin and beta-blocker and ACE inhibitor.  We will hold his  breakfast in the morning in anticipation of possible cardiac cath.  Further  workup as per Dr. [name].           ______________________________  Darlin Coco, M.D.     TB/MEDQ  D:  01/29/2006  T:  01/29/2006  Job:  [id number]   cc:   Ezzard Standing, M.D.

## 2010-11-01 NOTE — Discharge Summary (Signed)
NAMESARVESH, [name] NO.:  [id number]   MEDICAL RECORD NO.:  [id number]          PATIENT TYPE:  INP   LOCATION:  2920                         FACILITY:  [facility]   PHYSICIAN:  Ezzard Standing, M.D.DATE OF BIRTH:  04-Oct-1945   DATE OF ADMISSION:  01/29/2006  DATE OF DISCHARGE:  01/30/2006                                 DISCHARGE SUMMARY   FINAL DIAGNOSES:  1. Unstable angina pectoris.  2. Coronary artery disease.      a.     Severe stenosis in the circumflex marginal stents with a 3.0 x       15-mm Vision stent.      b.     LAD disease with haziness, stented with a 3.0 x 12.      c.     Long-term patency of stented site in proximal first marginal       branch.      d.     Residual stenosis of 50% in mid-rite coronary artery.  3. Severe hypertension.  4. Hyperlipidemia with metabolic syndrome.  5. Obesity.  6. Severe low back pain with chronic pain syndrome.   PROCEDURES:  Cardiac catheterization with stenting of the LAD and circumflex  marginal branch.   HISTORY:  The patient is a 65 year old male who has had previous coronary  stenting of the circumflex marginal branch in 1998.  He has had severe low  back pain and has not had any medical followup for about 3 years and stopped  taking all of his cardiac medications.  He developed chest discomfort  occurring after he changed the tire that lasted around 1 hour.  He presented  to the emergency room and was found to have mildly elevated troponins and  was admitted for evaluation.  Please see the previously dictated history and  physical for remainder of the details.   HOSPITAL COURSE:  The patient was admitted to the hospital for treatment of  unstable angina.  Chest x-ray showed heart size in upper limits of normal.  EKG evolved T-wave inversions in the anterior leads.  His troponin was 0.67.  Total CPK was 217 with an MB of 9.6.  Cholesterol was 175, LDL 80, HDL 34,  triglycerides 303.  Chemistry panel  showed a potassium of 3.3.  Glucose  random was 112.  Hemoglobin A1c was 5.9.  PSA was 2.66, done at patient's  request.  B natriuretic peptide was 53.   The patient was placed on Heparin and was started on treatment with beta  blockers and Ace inhibitors.  Because of unstable angina, he was taken to  the cardiac catheterization laboratory on January 29, 2006.  Left ventricular  function was normal.  Left main coronary artery was normal.  Left anterior  descending had a 70% proximal stenosis with haziness.  Circumflex had a 95%  mid-obtuse marginal artery stenosis.  Right coronary artery had a 50-60% mid-  vessel stenosis.  The patient had stenting of the LAD with a 3.0 x 12-mm  Vision stent post-dilated to 3.25 mm with 0% residual stenosis.  The  circumflex marginal  artery was stented with a 2.75 x 15-mm Vision stent,  post-dilated with a 2.75-mm balloon.  Had significant low back pain during  the procedure, due to his previous back problems.  His troponin remains  somewhat elevated due to his acute coronary syndrome but did not go up.  He  underwent Angio-Seal closure of the vessel and had some minor oozing that  eventually resolved.  He had no recurrence of chest discomfort but had some  asymptomatic bradycardia.  He was seen by cardiac rehab, and it was  recommended he have cardiac rehab in [place].  He was ambulatory in the hall  without further chest discomfort.  He received Integralin and was continued  on Plavix.  The importance of medical compliance was discussed with the  patient.  He was advised to follow a carbohydrate-restricted, caloric-  restricted diet.  He is to walk daily.   MEDICATIONS:  1. He is to be on aspirin 325 mg daily.  2. Plavix 75 mg daily with samples and vouchers being given to have them      refilled.  3. Amlodipine 10 mg daily.  4. Lisinopril/HCTZ 40/12.5 mg daily.  5. Nitroglycerin 1/150 sublingual p.r.n.  6. Toprol XL 50 mg daily.  7. Crestor 10 mg  every other day.  8. Vitamin CoQ 10 daily.   He is recommended to stop eating simple carbohydrates and is to follow up  with me in one week.  The importance of dietary medical compliance was  discussed with him.      Ezzard Standing, M.D.  Electronically Signed     WST/MEDQ  D:  01/30/2006  T:  01/30/2006  Job:  [id number]

## 2011-03-31 LAB — BASIC METABOLIC PANEL
BUN: 12
CO2: 27
Chloride: 106
Creatinine, Ser: 0.91

## 2011-03-31 LAB — HEMOGLOBIN AND HEMATOCRIT, BLOOD
HCT: 31.9 — ABNORMAL LOW
HCT: 38.1 — ABNORMAL LOW
Hemoglobin: 10.9 — ABNORMAL LOW

## 2011-03-31 LAB — CBC
HCT: 41.5
MCHC: 34.4
MCV: 84.4
MCV: 86.3
Platelets: 175
Platelets: 185
Platelets: 215
RDW: 13.2
WBC: 6.9
WBC: 6.9

## 2011-03-31 LAB — TYPE AND SCREEN

## 2011-03-31 LAB — ABO/RH: ABO/RH(D): O POS

## 2011-03-31 LAB — DIFFERENTIAL
Basophils Absolute: 0.1
Eosinophils Absolute: 0.4
Eosinophils Relative: 5
Neutrophils Relative %: 54

## 2011-03-31 LAB — I-STAT 8, (EC8 V) (CONVERTED LAB)
Bicarbonate: 26.2 — ABNORMAL HIGH
Glucose, Bld: 113 — ABNORMAL HIGH
TCO2: 27
pCO2, Ven: 40.5 — ABNORMAL LOW
pH, Ven: 7.42 — ABNORMAL HIGH

## 2011-03-31 LAB — POCT I-STAT CREATININE
Creatinine, Ser: 1.2
Operator id: 198171

## 2011-03-31 LAB — COMPREHENSIVE METABOLIC PANEL
ALT: 24
AST: 26
Albumin: 3.5
Alkaline Phosphatase: 55
BUN: 23
GFR calc Af Amer: >60
Potassium: 4.1
Sodium: 141
Total Protein: 6.4

## 2011-03-31 LAB — PROTIME-INR: INR: 1

## 2011-04-23 ENCOUNTER — Other Ambulatory Visit: Payer: Self-pay

## 2011-08-20 ENCOUNTER — Telehealth: Payer: Self-pay | Admitting: Internal Medicine

## 2011-08-20 DIAGNOSIS — R911 Solitary pulmonary nodule: Secondary | ICD-10-CM

## 2011-08-20 NOTE — Telephone Encounter (Signed)
[name]  Last seen in centricity. Last CT was fall 2011. Did not show up for CT per your message nov 2012. Pls call and have ct chest without contrast done and office visit after that  Thanks  MR

## 2011-08-27 NOTE — Telephone Encounter (Signed)
Pt has appt on 11-11-11. And order placed for ct to have same day. Esto Bing, CMA

## 2011-09-02 ENCOUNTER — Other Ambulatory Visit: Payer: Self-pay | Admitting: Cardiology

## 2011-10-07 ENCOUNTER — Other Ambulatory Visit: Payer: Self-pay | Admitting: Cardiology

## 2011-11-07 ENCOUNTER — Encounter: Payer: Self-pay | Admitting: Internal Medicine

## 2011-11-11 ENCOUNTER — Encounter: Payer: Self-pay | Admitting: Internal Medicine

## 2011-11-11 ENCOUNTER — Ambulatory Visit (INDEPENDENT_AMBULATORY_CARE_PROVIDER_SITE_OTHER): Payer: Medicare Other | Admitting: Internal Medicine

## 2011-11-11 VITALS — BP 112/70 | HR 69 | Temp 98.1°F | Ht 74.0 in | Wt 303.0 lb

## 2011-11-11 DIAGNOSIS — R911 Solitary pulmonary nodule: Secondary | ICD-10-CM

## 2011-11-11 DIAGNOSIS — R05 Cough: Secondary | ICD-10-CM

## 2011-11-11 DIAGNOSIS — G4730 Sleep apnea, unspecified: Secondary | ICD-10-CM

## 2011-11-11 DIAGNOSIS — J984 Other disorders of lung: Secondary | ICD-10-CM

## 2011-11-11 NOTE — Patient Instructions (Signed)
#  lung nodule   - please do CAT scan of chest; I will call you with results  #Shortness of breath  - glad this is better - I agree this is weight related; please work with your primary care doctor about weight reduction  #Sleep apnea  - respect your desire not to use cpap machine  #Cough  - please talk to Dr Alyson Ingles and please have him stop your lisinopril for another medication; this lisinopril is either causing or making your cough worse  - I willl send note to Dr Alyson Ingles  #Followup  - depending on CT chest result

## 2011-11-11 NOTE — Progress Notes (Signed)
  Subjective:    Patient ID: [name], male    DOB: 27-Apr-1946, 66 y.o.   MRN: [id number]  HPI  # dyspnea due to obesity (copd ruled out) and   #- LLL 6 mmm pulmonary nodule since Jan 2011 in morbidly obese patient,   # OSA - intolerant to cPAP  #  sp bilateral LL pneumonia Oct 2010 and   # ex smoker.   OV 11/11/2011  Last seen Nov 2011. Then did not followup. Overal well. Dyspnea improved. NO COPD but did CAT score and result is 7. Has mild-mod cough; noted to be on ace inhibitor. Has not had CT for his lung nodule. Has sleep apnea and is not interested in cpap   CAT COPD Symptom and Quality of Life Score ([employer])  0 (no burden) to 5 (highest burden)  Never Cough -> Cough all the time 2  No phlegm in chest -> Chest is full of phlegm 1  No chest tightness -> Chest feels very tight 0  No dyspnea for 1 flight stairs/hill -> Very dyspneic for 1 flight of stairs 2  No limitations for ADL at home -> Very limited with ADL at home 0  Confident leaving home -> Not at all confident leaving home 0  Sleep soundly -> Do not sleep soundly because of lung condition 0  Lots of Energy -> No energy at all 2  TOTAL Score (max 40)  7   Past, Family, Social reviewed: no change since last visit   Review of Systems  Constitutional: Negative for fever and unexpected weight change.  HENT: Negative for ear pain, nosebleeds, congestion, sore throat, rhinorrhea, sneezing, trouble swallowing, dental problem, postnasal drip and sinus pressure.   Eyes: Negative for redness and itching.  Respiratory: Positive for cough. Negative for chest tightness, shortness of breath and wheezing.   Cardiovascular: Negative for palpitations and leg swelling.  Gastrointestinal: Negative for nausea and vomiting.  Genitourinary: Negative for dysuria.  Musculoskeletal: Negative for joint swelling.  Skin: Negative for rash.  Neurological: Negative for headaches.  Hematological: Does not  bruise/bleed easily.  Psychiatric/Behavioral: Negative for dysphoric mood. The patient is not nervous/anxious.        Objective:   Physical Exam Physical Exam  General: obese.  Head: normocephalic and atraumatic  Eyes: PERRLA/EOM intact; conjunctiva and sclera clear  Ears: TMs intact and clear with normal canals  Nose: no deformity, discharge, inflammation, or lesions  Mouth: no deformity or lesionsMelampatti Class IV. Melampatti Class IV.  Neck: no masses, thyromegaly, or abnormal cervical nodes  Chest Wall: no deformities noted  Lungs: clear bilaterally to auscultation and percussiondecreased BS bilateral. decreased BS bilateral.  Heart: regular rate and rhythm, S1, S2 without murmurs, rubs, gallops, or clicks  Abdomen: obese  soft  no mass  left flank is non tender  normal bowel sounds  Msk: no deformity or scoliosis noted with normal posture  Pulses: pulses normal  Neurologic: CN II-XII grossly intact with normal reflexes, coordination, muscle strength and tone  Skin: intact without lesions or rashes  Cervical Nodes: no significant adenopathy  Axillary Nodes: no significant adenopathy  Psych: alert and cooperative; normal mood and affect; normal attention span and concentration       Assessment & Plan:

## 2011-11-13 ENCOUNTER — Other Ambulatory Visit: Payer: Medicare Other

## 2011-11-14 ENCOUNTER — Other Ambulatory Visit: Payer: Medicare Other

## 2011-11-16 DIAGNOSIS — R053 Chronic cough: Secondary | ICD-10-CM | POA: Insufficient documentation

## 2011-11-16 DIAGNOSIS — R05 Cough: Secondary | ICD-10-CM | POA: Insufficient documentation

## 2011-11-16 NOTE — Assessment & Plan Note (Signed)
Respect decision not to use cpap

## 2011-11-16 NOTE — Assessment & Plan Note (Signed)
#  Cough  - please talk to Dr Alyson Ingles and please have him stop your lisinopril for another medication; this lisinopril is either causing or making your cough worse  - I willl send note to Dr Alyson Ingles

## 2011-11-16 NOTE — Assessment & Plan Note (Signed)
Do ct scan chest

## 2011-11-16 NOTE — Assessment & Plan Note (Signed)
#  Shortness of breath  - glad this is better - I agree this is weight related; please work with your primary care doctor about weight reduction  #Followup  - depending on CT chest result

## 2011-11-17 ENCOUNTER — Ambulatory Visit (INDEPENDENT_AMBULATORY_CARE_PROVIDER_SITE_OTHER)
Admission: RE | Admit: 2011-11-17 | Discharge: 2011-11-17 | Disposition: A | Discharge status: Home / Self Care | Payer: Medicare Other | Source: Ambulatory Visit | Attending: Internal Medicine | Admitting: Internal Medicine

## 2011-11-17 DIAGNOSIS — R911 Solitary pulmonary nodule: Secondary | ICD-10-CM

## 2012-07-13 ENCOUNTER — Ambulatory Visit
Admission: RE | Admit: 2012-07-13 | Discharge: 2012-07-13 | Disposition: A | Discharge status: Home / Self Care | Payer: Medicare Other | Source: Ambulatory Visit | Attending: Family Medicine | Admitting: Family Medicine

## 2012-07-13 ENCOUNTER — Other Ambulatory Visit: Payer: Self-pay | Admitting: Family Medicine

## 2012-07-13 DIAGNOSIS — M79606 Pain in leg, unspecified: Secondary | ICD-10-CM

## 2013-06-16 HISTORY — PX: PROSTATE BIOPSY: SHX241

## 2014-01-05 ENCOUNTER — Other Ambulatory Visit: Payer: Self-pay | Admitting: Family Medicine

## 2014-01-05 DIAGNOSIS — Z136 Encounter for screening for cardiovascular disorders: Secondary | ICD-10-CM

## 2014-01-09 ENCOUNTER — Ambulatory Visit
Admission: RE | Admit: 2014-01-09 | Discharge: 2014-01-09 | Disposition: A | Discharge status: Home / Self Care | Payer: Commercial Managed Care - HMO | Source: Ambulatory Visit | Attending: Family Medicine | Admitting: Family Medicine

## 2014-01-09 DIAGNOSIS — Z136 Encounter for screening for cardiovascular disorders: Secondary | ICD-10-CM

## 2014-03-27 ENCOUNTER — Ambulatory Visit (HOSPITAL_BASED_OUTPATIENT_CLINIC_OR_DEPARTMENT_OTHER): Payer: Medicare HMO | Attending: Internal Medicine

## 2014-03-27 VITALS — Ht 74.0 in | Wt 319.0 lb

## 2014-03-27 DIAGNOSIS — Z9989 Dependence on other enabling machines and devices: Secondary | ICD-10-CM

## 2014-03-27 DIAGNOSIS — G4730 Sleep apnea, unspecified: Secondary | ICD-10-CM | POA: Diagnosis present

## 2014-03-27 DIAGNOSIS — G4733 Obstructive sleep apnea (adult) (pediatric): Secondary | ICD-10-CM | POA: Diagnosis not present

## 2014-04-01 DIAGNOSIS — G4733 Obstructive sleep apnea (adult) (pediatric): Secondary | ICD-10-CM

## 2014-04-01 NOTE — Sleep Study (Signed)
   NAME: [name] DATE OF BIRTH:  1946/02/17 MEDICAL RECORD NUMBER [id number]  LOCATION: [facility]  PHYSICIAN: Aubryanna Nesheim D  DATE OF STUDY: 03/27/2014  SLEEP STUDY TYPE: Nocturnal Polysomnogram               REFERRING PHYSICIAN: Jerrell Belfast, MD  INDICATION FOR STUDY: Hypersomnia with sleep apnea  EPWORTH SLEEPINESS SCORE:   18/24  HEIGHT: 6\' 2"  (188 cm)  WEIGHT: 319 lb (144.697 kg)    Body mass index is 40.94 kg/(m^2).  NECK SIZE:   19.5 in.  MEDICATIONS: Charted for review  SLEEP ARCHITECTURE: Split study protocol. During the diagnostic phase, total sleep time 122 minutes with sleep efficiency 78.5%. Stage I was 32%, stage II 59.4%, stage III absent, REM 8.6% of total sleep time. Sleep latency 4.5 minutes, REM latency 125.5 minutes, awake after sleep onset 29 minutes, arousal index 40.8, bedtime medication: None  RESPIRATORY DATA: Apnea hypopneas index (AHI) 97.4 per hour. 198 total events scored. 34 obstructive apneas, 1 central apnea, 163 hypopneas. All events were associated with non-supine sleep position. REM AHI 80 per hour. CPAP titration to 16 CWP, AHI 1.9 per hour. He wore a large fullface mask.  OXYGEN DATA: Moderate to loud snoring with oxygen desaturation to a nadir of 79%. With CPAP control, mean oxygen saturation was 92% on room air.  CARDIAC DATA: Sinus rhythm with PVCs  MOVEMENT/PARASOMNIA: A total of 90 limb jerks were counted during the titration phase but with very little sleep disturbance. Bathroom x1  IMPRESSION/ RECOMMENDATION:   1) Severe obstructive sleep apnea/hypopneas syndrome, AHI 97.4 per hour. Nonsupine events. REM AHI 80 per hour. Moderate to loud snoring with oxygen desaturation to a nadir of 79% on room air 2) Successful CPAP titration to 16 CWP, AHI 1.9 per hour. He wore a large ResMed AirFit F10 fullface mask with heated humidifier and EPR of 2. Snoring was prevented and mean oxygen saturation was 92% on room  air.   Deneise Lever Diplomate, American Board of Sleep Medicine  ELECTRONICALLY SIGNED ON:  04/01/2014, 10:06 AM [place] PH: [phone]   FX: [phone] [place]

## 2014-05-30 ENCOUNTER — Ambulatory Visit (INDEPENDENT_AMBULATORY_CARE_PROVIDER_SITE_OTHER): Payer: Commercial Managed Care - HMO | Admitting: Pulmonary Disease

## 2014-05-30 ENCOUNTER — Encounter: Payer: Self-pay | Admitting: Pulmonary Disease

## 2014-05-30 VITALS — BP 138/74 | HR 70 | Temp 97.0°F | Ht 73.0 in | Wt 313.6 lb

## 2014-05-30 DIAGNOSIS — G4733 Obstructive sleep apnea (adult) (pediatric): Secondary | ICD-10-CM

## 2014-05-30 NOTE — Assessment & Plan Note (Signed)
The patient has a history of severe obstructive sleep apnea with failure to tolerate C Pap, and has had a recent sleep study that shows an AHI of 97 events per hour. I have had a long discussion with him about sleep apnea, including its impact to his quality of life and cardiovascular health. I have explained to him this is a life threatening disorder, and we will need to treat this aggressively. I have recommended that he use a positive pressure device with aggressive weight loss, with the other option being a tracheostomy. He has failed C Pap in the past, and I would like to try him on a bilevel device. Will start out at a moderate pressure to allow for desensitization, and will also try trazodone at bedtime to help with this.

## 2014-05-30 NOTE — Patient Instructions (Addendum)
Will start you on bipap at a pressure of 12/8 with a full face mask.  Please call if you are having tolerance issues. Will try trazodone 50mg  (#21), take one at bedtime if you need something to help you wear the bipap.  Do not take if you are not going to try and wear your machine  Work on weight loss followup with me again in 8 weeks, but call if you are having a hard time tolerating the machine

## 2014-05-30 NOTE — Progress Notes (Signed)
   Subjective:    Patient ID: [name], male    DOB: 22-Mar-1946, 68 y.o.   MRN: [id number]  HPI The patient is a 68 year old male who I've been asked to see for management of obstructive sleep apnea. He was initially diagnosed in 2008 with severe OSA, but was not able to tolerate C Pap even on the automatic setting. He has recently been evaluated again by otolaryngology, and had a sleep study in October that showed an AHI of 97 events per hour. He was started on C Pap as part of a split night protocol, and found to have an optimal pressure of 16 cm of water. The patient has been noted to have loud snoring, as well as an abnormal breathing pattern during sleep. He has frequent awakenings at night, and is not rested in the mornings upon arising. He will fall asleep with any period of inactivity during the day or night, but denies any issues with sleepiness while driving. The patient states that his weight is up 50 pounds over the last 2 years, and his Epworth score today is 18.   Review of Systems  Constitutional: Negative for fever and unexpected weight change.  HENT: Negative for congestion, dental problem, ear pain, nosebleeds, postnasal drip, rhinorrhea, sinus pressure, sneezing, sore throat and trouble swallowing.   Eyes: Negative for redness and itching.  Respiratory: Negative for cough, chest tightness, shortness of breath and wheezing.   Cardiovascular: Negative for palpitations and leg swelling.  Gastrointestinal: Negative for nausea and vomiting.  Genitourinary: Negative for dysuria.  Musculoskeletal: Negative for joint swelling.  Skin: Negative for rash.  Neurological: Negative for headaches.  Hematological: Does not bruise/bleed easily.  Psychiatric/Behavioral: Negative for dysphoric mood. The patient is not nervous/anxious.        Objective:   Physical Exam Constitutional:  Morbidly obese male, no acute distress  HENT:  Nares patent without discharge, turbinate  hypertrophy  Oropharynx without exudate, palate and uvula are elongated  Eyes:  Perrla, eomi, no scleral icterus  Neck:  No JVD, no TMG  Cardiovascular:  Normal rate, regular rhythm, no rubs or gallops.  No murmurs        Intact distal pulses but decreased  Pulmonary :  Normal breath sounds, no stridor or respiratory distress   No rales, rhonchi, or wheezing  Abdominal:  Soft, nondistended, bowel sounds present.  No tenderness noted.   Musculoskeletal:  2+ lower extremity edema noted.  Lymph Nodes:  No cervical lymphadenopathy noted  Skin:  No cyanosis noted  Neurologic:  Appears sleepy, but appropriate, moves all 4 extremities without obvious deficit.         Assessment & Plan:

## 2014-06-17 DIAGNOSIS — G4733 Obstructive sleep apnea (adult) (pediatric): Secondary | ICD-10-CM | POA: Diagnosis not present

## 2014-07-13 DIAGNOSIS — G4733 Obstructive sleep apnea (adult) (pediatric): Secondary | ICD-10-CM | POA: Diagnosis not present

## 2014-07-18 DIAGNOSIS — G4733 Obstructive sleep apnea (adult) (pediatric): Secondary | ICD-10-CM | POA: Diagnosis not present

## 2014-07-25 ENCOUNTER — Encounter: Payer: Self-pay | Admitting: Pulmonary Disease

## 2014-07-25 ENCOUNTER — Ambulatory Visit (INDEPENDENT_AMBULATORY_CARE_PROVIDER_SITE_OTHER): Payer: Commercial Managed Care - HMO | Admitting: Pulmonary Disease

## 2014-07-25 VITALS — BP 132/80 | HR 53 | Ht 73.0 in | Wt 315.0 lb

## 2014-07-25 DIAGNOSIS — G4733 Obstructive sleep apnea (adult) (pediatric): Secondary | ICD-10-CM | POA: Diagnosis not present

## 2014-07-25 NOTE — Assessment & Plan Note (Signed)
The patient is doing very well on his bilevel device by his download, with good control of his AHI and adequate compliance. Been used to benefit with respect to improve sleep and daytime alertness. He is having some increased leak, and he tells me that he has not kept up with his supplies on a regular basis. He also wants to have his heated humidifier checked. Finally, I have encouraged him to work aggressively on weight loss, and have discussed with him decreased caloric intake and increasing his exercise time.

## 2014-07-25 NOTE — Progress Notes (Signed)
   Subjective:    Patient ID: [name], male    DOB: [birth year], 69 y.o.   MRN: [id number]  HPI The patient comes in today for follow-up of his known obstructive sleep apnea. Bilevel compliantly, and is having no issues with his pressure. He continues to sleep well with the device with improved daytime alertness. His download shows adequate compliance, and good control of his AHI. He is having some issues with mask leak, but has not kept up with his supplies on a regular basis. So is noting some issues with his heated humidifier.   Review of Systems  Constitutional: Negative for fever, chills, activity change and unexpected weight change.  HENT: Negative for congestion, dental problem, ear pain, nosebleeds, postnasal drip, rhinorrhea, sinus pressure, sneezing, sore throat and trouble swallowing.   Eyes: Negative for redness and itching.  Respiratory: Positive for cough, shortness of breath and wheezing. Negative for chest tightness.   Cardiovascular: Negative for palpitations and leg swelling.  Gastrointestinal: Negative for nausea and vomiting.  Genitourinary: Negative for dysuria.  Musculoskeletal: Negative for joint swelling.  Skin: Negative for rash.  Allergic/Immunologic: Negative.  Negative for food allergies.  Neurological: Negative for syncope, speech difficulty, weakness, numbness and headaches.  Hematological: Does not bruise/bleed easily.  Psychiatric/Behavioral: Negative for confusion, dysphoric mood and agitation. The patient is not nervous/anxious.        Objective:   Physical Exam Morbidly obese male in no acute distress Nose without purulence or discharge noted No skin breakdown or pressure necrosis from the C Pap mask Neck without lymphadenopathy or thyromegaly Lower extremities with mild edema, no cyanosis Alert and oriented, moves all 4 extremities.       Assessment & Plan:

## 2014-07-25 NOTE — Patient Instructions (Signed)
Stay on your bipap, and keep up with mask changes and supplies on a more regular basis Work on weight loss followup with me again in one year.

## 2014-08-03 DIAGNOSIS — G4733 Obstructive sleep apnea (adult) (pediatric): Secondary | ICD-10-CM | POA: Diagnosis not present

## 2014-08-09 DIAGNOSIS — E780 Pure hypercholesterolemia: Secondary | ICD-10-CM | POA: Diagnosis not present

## 2014-08-09 DIAGNOSIS — C61 Malignant neoplasm of prostate: Secondary | ICD-10-CM | POA: Diagnosis not present

## 2014-08-09 DIAGNOSIS — I10 Essential (primary) hypertension: Secondary | ICD-10-CM | POA: Diagnosis not present

## 2014-08-09 DIAGNOSIS — K219 Gastro-esophageal reflux disease without esophagitis: Secondary | ICD-10-CM | POA: Diagnosis not present

## 2014-08-13 DIAGNOSIS — G4733 Obstructive sleep apnea (adult) (pediatric): Secondary | ICD-10-CM | POA: Diagnosis not present

## 2014-08-16 DIAGNOSIS — G4733 Obstructive sleep apnea (adult) (pediatric): Secondary | ICD-10-CM | POA: Diagnosis not present

## 2014-08-22 DIAGNOSIS — C61 Malignant neoplasm of prostate: Secondary | ICD-10-CM | POA: Diagnosis not present

## 2014-08-22 DIAGNOSIS — N402 Nodular prostate without lower urinary tract symptoms: Secondary | ICD-10-CM | POA: Diagnosis not present

## 2014-08-23 DIAGNOSIS — H4053X1 Glaucoma secondary to other eye disorders, bilateral, mild stage: Secondary | ICD-10-CM | POA: Diagnosis not present

## 2014-08-23 DIAGNOSIS — H4720 Unspecified optic atrophy: Secondary | ICD-10-CM | POA: Diagnosis not present

## 2014-08-23 DIAGNOSIS — H4052X3 Glaucoma secondary to other eye disorders, left eye, severe stage: Secondary | ICD-10-CM | POA: Diagnosis not present

## 2014-08-23 DIAGNOSIS — H40033 Anatomical narrow angle, bilateral: Secondary | ICD-10-CM | POA: Diagnosis not present

## 2014-09-11 DIAGNOSIS — G4733 Obstructive sleep apnea (adult) (pediatric): Secondary | ICD-10-CM | POA: Diagnosis not present

## 2014-09-16 DIAGNOSIS — G4733 Obstructive sleep apnea (adult) (pediatric): Secondary | ICD-10-CM | POA: Diagnosis not present

## 2014-09-21 ENCOUNTER — Emergency Department (HOSPITAL_BASED_OUTPATIENT_CLINIC_OR_DEPARTMENT_OTHER): Payer: Commercial Managed Care - HMO

## 2014-09-21 ENCOUNTER — Encounter (HOSPITAL_BASED_OUTPATIENT_CLINIC_OR_DEPARTMENT_OTHER): Payer: Self-pay | Admitting: *Deleted

## 2014-09-21 ENCOUNTER — Observation Stay (HOSPITAL_BASED_OUTPATIENT_CLINIC_OR_DEPARTMENT_OTHER)
Admission: EM | Admit: 2014-09-21 | Discharge: 2014-09-23 | Disposition: A | Discharge status: Home / Self Care | Payer: Commercial Managed Care - HMO | Attending: Internal Medicine | Admitting: Internal Medicine

## 2014-09-21 DIAGNOSIS — R079 Chest pain, unspecified: Secondary | ICD-10-CM | POA: Diagnosis not present

## 2014-09-21 DIAGNOSIS — R0789 Other chest pain: Secondary | ICD-10-CM | POA: Diagnosis not present

## 2014-09-21 DIAGNOSIS — R7989 Other specified abnormal findings of blood chemistry: Secondary | ICD-10-CM

## 2014-09-21 DIAGNOSIS — R197 Diarrhea, unspecified: Secondary | ICD-10-CM

## 2014-09-21 DIAGNOSIS — E785 Hyperlipidemia, unspecified: Secondary | ICD-10-CM | POA: Diagnosis present

## 2014-09-21 DIAGNOSIS — I441 Atrioventricular block, second degree: Secondary | ICD-10-CM | POA: Insufficient documentation

## 2014-09-21 DIAGNOSIS — I10 Essential (primary) hypertension: Secondary | ICD-10-CM | POA: Diagnosis not present

## 2014-09-21 DIAGNOSIS — R748 Abnormal levels of other serum enzymes: Secondary | ICD-10-CM | POA: Diagnosis present

## 2014-09-21 DIAGNOSIS — R001 Bradycardia, unspecified: Secondary | ICD-10-CM | POA: Insufficient documentation

## 2014-09-21 DIAGNOSIS — R945 Abnormal results of liver function studies: Secondary | ICD-10-CM

## 2014-09-21 DIAGNOSIS — R1110 Vomiting, unspecified: Secondary | ICD-10-CM | POA: Diagnosis not present

## 2014-09-21 DIAGNOSIS — G4733 Obstructive sleep apnea (adult) (pediatric): Secondary | ICD-10-CM | POA: Diagnosis present

## 2014-09-21 DIAGNOSIS — R112 Nausea with vomiting, unspecified: Secondary | ICD-10-CM | POA: Diagnosis not present

## 2014-09-21 DIAGNOSIS — I119 Hypertensive heart disease without heart failure: Secondary | ICD-10-CM | POA: Diagnosis present

## 2014-09-21 DIAGNOSIS — R1010 Upper abdominal pain, unspecified: Secondary | ICD-10-CM | POA: Diagnosis not present

## 2014-09-21 LAB — COMPREHENSIVE METABOLIC PANEL
ALBUMIN: 3.6 g/dL (ref 3.5–5.2)
ALT: 126 U/L — AB (ref 0–53)
AST: 203 U/L — ABNORMAL HIGH (ref 0–37)
Alkaline Phosphatase: 81 U/L (ref 39–117)
Anion gap: 8 (ref 5–15)
BUN: 17 mg/dL (ref 6–23)
CO2: 24 mmol/L (ref 19–32)
Calcium: 8.8 mg/dL (ref 8.4–10.5)
Chloride: 105 mmol/L (ref 96–112)
Creatinine, Ser: 1.09 mg/dL (ref 0.50–1.35)
GFR calc Af Amer: 77 mL/min — ABNORMAL LOW (ref >90)
GFR calc non Af Amer: 67 mL/min — ABNORMAL LOW (ref >90)
Glucose, Bld: 111 mg/dL — ABNORMAL HIGH (ref 70–99)
Potassium: 3.1 mmol/L — ABNORMAL LOW (ref 3.5–5.1)
SODIUM: 137 mmol/L (ref 135–145)
TOTAL PROTEIN: 6.7 g/dL (ref 6.0–8.3)
Total Bilirubin: 0.7 mg/dL (ref 0.3–1.2)

## 2014-09-21 LAB — CBC WITH DIFFERENTIAL/PLATELET
BASOS ABS: 0 10*3/uL (ref 0.0–0.1)
Basophils Relative: 0 % (ref 0–1)
EOS PCT: 2 % (ref 0–5)
Eosinophils Absolute: 0.2 10*3/uL (ref 0.0–0.7)
HCT: 46.8 % (ref 39.0–52.0)
HEMOGLOBIN: 15.8 g/dL (ref 13.0–17.0)
LYMPHS ABS: 2.2 10*3/uL (ref 0.7–4.0)
LYMPHS PCT: 20 % (ref 12–46)
MCH: 29.3 pg (ref 26.0–34.0)
MCHC: 33.8 g/dL (ref 30.0–36.0)
MCV: 86.7 fL (ref 78.0–100.0)
Monocytes Absolute: 1.3 10*3/uL — ABNORMAL HIGH (ref 0.1–1.0)
Monocytes Relative: 12 % (ref 3–12)
NEUTROS ABS: 7.2 10*3/uL (ref 1.7–7.7)
Neutrophils Relative %: 66 % (ref 43–77)
Platelets: 181 10*3/uL (ref 150–400)
RBC: 5.4 MIL/uL (ref 4.22–5.81)
RDW: 14.6 % (ref 11.5–15.5)
WBC: 10.9 10*3/uL — ABNORMAL HIGH (ref 4.0–10.5)

## 2014-09-21 LAB — LIPASE, BLOOD: LIPASE: 35 U/L (ref 11–59)

## 2014-09-21 LAB — TROPONIN I: Troponin I: <0.03 ng/mL (ref <0.031)

## 2014-09-21 MED ORDER — AMLODIPINE BESYLATE 10 MG PO TABS
10.0000 mg | ORAL_TABLET | Freq: Every day | ORAL | Status: DC
  Administered 2014-09-22 – 2014-09-23 (×2): 10 mg via ORAL
  Filled 2014-09-21 (×3): qty 1

## 2014-09-21 MED ORDER — ALLOPURINOL 300 MG PO TABS
300.0000 mg | ORAL_TABLET | Freq: Every day | ORAL | Status: DC
  Administered 2014-09-22 – 2014-09-23 (×2): 300 mg via ORAL
  Filled 2014-09-21 (×3): qty 1

## 2014-09-21 MED ORDER — ASPIRIN EC 325 MG PO TBEC
325.0000 mg | DELAYED_RELEASE_TABLET | Freq: Every day | ORAL | Status: DC
  Administered 2014-09-22 – 2014-09-23 (×2): 325 mg via ORAL
  Filled 2014-09-21 (×3): qty 1

## 2014-09-21 MED ORDER — POTASSIUM CHLORIDE CRYS ER 20 MEQ PO TBCR
EXTENDED_RELEASE_TABLET | ORAL | Status: AC
  Filled 2014-09-21: qty 2

## 2014-09-21 MED ORDER — LOSARTAN POTASSIUM 50 MG PO TABS
100.0000 mg | ORAL_TABLET | Freq: Every day | ORAL | Status: DC
  Administered 2014-09-22 – 2014-09-23 (×2): 100 mg via ORAL
  Filled 2014-09-21 (×3): qty 2

## 2014-09-21 MED ORDER — ONDANSETRON HCL 4 MG PO TABS: 4.0000 mg | ORAL_TABLET | Freq: Four times a day (QID) | ORAL | Status: DC | PRN

## 2014-09-21 MED ORDER — ACETAMINOPHEN 325 MG PO TABS: 650.0000 mg | ORAL_TABLET | Freq: Four times a day (QID) | ORAL | Status: DC | PRN

## 2014-09-21 MED ORDER — SODIUM CHLORIDE 0.9 % IJ SOLN
3.0000 mL | Freq: Two times a day (BID) | INTRAMUSCULAR | Status: DC
  Administered 2014-09-23: 3 mL via INTRAVENOUS

## 2014-09-21 MED ORDER — POTASSIUM CHLORIDE CRYS ER 20 MEQ PO TBCR
30.0000 meq | EXTENDED_RELEASE_TABLET | Freq: Once | ORAL | Status: AC
  Administered 2014-09-21: 30 meq via ORAL

## 2014-09-21 MED ORDER — POTASSIUM CHLORIDE 10 MEQ/100ML IV SOLN
10.0000 meq | Freq: Once | INTRAVENOUS | Status: AC
  Administered 2014-09-21: 10 meq via INTRAVENOUS
  Filled 2014-09-21: qty 100

## 2014-09-21 MED ORDER — ACETAMINOPHEN 650 MG RE SUPP: 650.0000 mg | Freq: Four times a day (QID) | RECTAL | Status: DC | PRN

## 2014-09-21 MED ORDER — PANTOPRAZOLE SODIUM 40 MG PO TBEC
40.0000 mg | DELAYED_RELEASE_TABLET | Freq: Every day | ORAL | Status: DC
  Administered 2014-09-22 – 2014-09-23 (×2): 40 mg via ORAL
  Filled 2014-09-21 (×2): qty 1

## 2014-09-21 MED ORDER — HYDROCHLOROTHIAZIDE 25 MG PO TABS
25.0000 mg | ORAL_TABLET | Freq: Every day | ORAL | Status: DC
  Administered 2014-09-22 – 2014-09-23 (×2): 25 mg via ORAL
  Filled 2014-09-21 (×3): qty 1

## 2014-09-21 MED ORDER — ONDANSETRON HCL 4 MG/2ML IJ SOLN
4.0000 mg | Freq: Four times a day (QID) | INTRAMUSCULAR | Status: DC | PRN
  Administered 2014-09-23: 4 mg via INTRAVENOUS
  Filled 2014-09-21: qty 2

## 2014-09-21 NOTE — Progress Notes (Signed)
Pt placed on cpap at this time via full face mask per home regimen

## 2014-09-21 NOTE — ED Provider Notes (Addendum)
CSN: [id number]     Arrival date & time 09/21/14  1424 History   First MD Initiated Contact with Patient 09/21/14 [phone]     Chief Complaint  Patient presents with  . Chest Pain      HPI Patient's been having nausea and diarrhea associated with some vomiting which started Tuesday.  Over the last 24-48 hours has had right-sided chest pain which is been intermittent.  Radiates into his back.  Some shortness of breath.  No hematochezia or hematemesis.  Patient has past medical history of high cardial infarction.  Previous smoker but stopped several years ago. Past Medical History  Diagnosis Date  . Heart attack   . Obesity   . Hyperlipidemia   . OSA (obstructive sleep apnea)   . GERD (gastroesophageal reflux disease)   . Hypertension   . CAD (coronary artery disease)   . COPD (chronic obstructive pulmonary disease)   . Pneumonia   . Dyspnea   . Pulmonary nodule    Past Surgical History  Procedure Laterality Date  . Carotid stent    . Cholecystectomy    . Back surgery    . Colon surgery    . Appendectomy     Family History  Problem Relation Age of Onset  . COPD Sister   . Sleep apnea      3 siblings  . Hypertension Mother   . Coronary artery disease Mother   . Aneurysm Father    History  Substance Use Topics  . Smoking status: Former Smoker -- 3.00 packs/day for 34 years    Types: Cigarettes    Quit date: 06/17/1983  . Smokeless tobacco: Not on file  . Alcohol Use: No    Review of Systems  All other systems reviewed and are negative  Allergies  Review of patient's allergies indicates no known allergies.  Home Medications   Prior to Admission medications   Medication Sig Start Date End Date Taking? Authorizing Provider  acetaminophen (TYLENOL) 500 MG tablet Take 500 mg by mouth every 6 (six) hours as needed for moderate pain.   Yes Historical Provider, MD  allopurinol (ZYLOPRIM) 300 MG tablet Take 300 mg by mouth Daily.  11/07/11  Yes Historical Provider, MD   amLODipine (NORVASC) 10 MG tablet Take 10 mg by mouth Daily.  11/07/11  Yes Historical Provider, MD  hydrochlorothiazide (HYDRODIURIL) 25 MG tablet Take 25 mg by mouth daily.   Yes Historical Provider, MD  losartan (COZAAR) 100 MG tablet Take 100 mg by mouth daily.   Yes Historical Provider, MD  omeprazole (PRILOSEC) 20 MG capsule Take 20 mg by mouth daily.   Yes Historical Provider, MD  atorvastatin (LIPITOR) 20 MG tablet Take 0.5 tablets (10 mg total) by mouth daily. 09/23/14   Domenic Polite, MD   BP 145/65 mmHg  Pulse 54  Temp(Src) 97.5 F (36.4 C) (Oral)  Resp 18  Ht 6\' 2"  (1.88 m)  Wt 300 lb 3.2 oz (136.17 kg)  BMI 38.53 kg/m2  SpO2 97% Physical Exam Physical Exam  Nursing note and vitals reviewed. Constitutional: He is oriented to person, place, and time. He appears well-developed and well-nourished. No distress.  HENT:  Head: Normocephalic and atraumatic.  Eyes: Pupils are equal, round, and reactive to light.  Neck: Normal range of motion.  Cardiovascular: Normal rate and intact distal pulses.   Pulmonary/Chest: No respiratory distress.  Breath sounds equal to auscultation.  No significant wheezes or rales.   Abdominal: Normal appearance. He exhibits no distension.  No rebound or guarding tenderness.  Active bowel sounds in all quadrants.   Musculoskeletal: Normal range of motion.  Neurological: He is alert and oriented to person, place, and time. No cranial nerve deficit.  Skin: Skin is warm and dry. No rash noted.    ED Course  Procedures (including critical care time)  Medications  atropine 0.1 MG/ML injection (not administered)  potassium chloride 10 mEq in 100 mL IVPB (0 mEq Intravenous Stopped 09/21/14 1840)  potassium chloride SA (K-DUR,KLOR-CON) CR tablet 30 mEq (30 mEq Oral Given 09/21/14 1706)  potassium chloride SA (K-DUR,KLOR-CON) 20 MEQ CR tablet (  Duplicate [id number])    Labs Review Labs Reviewed  COMPREHENSIVE METABOLIC PANEL - Abnormal; Notable for the  following:    Potassium 3.1 (*)    Glucose, Bld 111 (*)    AST 203 (*)    ALT 126 (*)    GFR calc non Af Amer 67 (*)    GFR calc Af Amer 77 (*)    All other components within normal limits  CBC WITH DIFFERENTIAL/PLATELET - Abnormal; Notable for the following:    WBC 10.9 (*)    Monocytes Absolute 1.3 (*)    All other components within normal limits  HEPATIC FUNCTION PANEL - Abnormal; Notable for the following:    Albumin 3.2 (*)    AST 190 (*)    ALT 207 (*)    All other components within normal limits  BASIC METABOLIC PANEL - Abnormal; Notable for the following:    GFR calc non Af Amer 72 (*)    GFR calc Af Amer 84 (*)    Anion gap 2 (*)    All other components within normal limits  BASIC METABOLIC PANEL - Abnormal; Notable for the following:    Glucose, Bld 119 (*)    GFR calc non Af Amer 68 (*)    GFR calc Af Amer 79 (*)    All other components within normal limits  ACETAMINOPHEN LEVEL - Abnormal; Notable for the following:    Acetaminophen (Tylenol), Serum <10.0 (*)    All other components within normal limits  COMPREHENSIVE METABOLIC PANEL - Abnormal; Notable for the following:    Glucose, Bld 111 (*)    Albumin 3.2 (*)    AST 87 (*)    ALT 161 (*)    GFR calc non Af Amer 64 (*)    GFR calc Af Amer 74 (*)    Anion gap 3 (*)    All other components within normal limits  CLOSTRIDIUM DIFFICILE BY PCR  STOOL CULTURE  MRSA PCR SCREENING  LIPASE, BLOOD  TROPONIN I  TROPONIN I  TROPONIN I  TROPONIN I  HEPATITIS PANEL, ACUTE  CBC WITH DIFFERENTIAL/PLATELET  TSH  MAGNESIUM  PROTIME-INR  CBC    Imaging Review No results found. CLINICAL DATA: Right chest and upper abdominal pain with diarrhea and vomiting for 2 days.  EXAM: DG ABDOMEN ACUTE W/ 1V CHEST  COMPARISON: CT abdomen and pelvis 05/02/2010.  FINDINGS: Single view of the chest demonstrates clear lungs and normal heart size. No pneumothorax or pleural effusion.  Two views of the abdomen show no  free intraperitoneal air. The bowel gas pattern is unremarkable. Convex right scoliosis is noted. The patient is status post lower lumbar fusion. Multiple surgical clips are seen in the right upper quadrant of the abdomen.  IMPRESSION: No acute abnormality chest or abdomen.   Electronically Signed By: Inge Rise M.D. On: 09/21/2014 16:11  EKG  Interpretation   Date/Time:  Thursday September 21 2014 14:31:14 EDT Ventricular Rate:  61 PR Interval:  344 QRS Duration: 96 QT Interval:  404 QTC Calculation: 406 R Axis:   38 Text Interpretation:  Sinus rhythm with 1st degree A-V block Nonspecific  ST and T wave abnormality Abnormal ECG Confirmed by Audie Pinto  MD, Byard Carranza  ([id number]) on 09/21/2014 3:10:44 PM      MDM   Final diagnoses:  Chest pain        Leonard Schwartz, MD 09/21/14 1729  Leonard Schwartz, MD 09/26/14 [phone]

## 2014-09-21 NOTE — Progress Notes (Signed)
69 -year-old gentleman with history of coronary artery disease, COPD, presented to [facility] with complaints of chest pain which began on Tuesday. Chest pain is located on the right side. Patient had nausea, vomiting, diarrhea which started on Tuesdays well. The diarrhea has continued however the vomiting has stopped. Patient does have a potassium of 3.1. EKG shows first-degree AV block however no acute changes. Troponin negative 1. Patient also has some abdominal pain. Patient's LFTs were also elevated however he is currently on a statin. Vital signs are stable.  Patient accepted to telemetry unit for observation.  Time spent: 5 minutes  Maryfrances Portugal D.O. [facility] Pager [phone]  If 7PM-7AM, please contact night-coverage [email or web address] Password [facility] 09/21/2014, 5:12 PM

## 2014-09-21 NOTE — ED Notes (Signed)
Patient weights-315 lbs and 6'2" in height

## 2014-09-21 NOTE — ED Notes (Signed)
[facility] has been notified of room [phone]

## 2014-09-21 NOTE — ED Notes (Addendum)
Chest pain this am. Right sided into his back. Sob. He had diarrhea and vomiting for 2 days.

## 2014-09-21 NOTE — H&P (Signed)
[facility] History and Physical  [name] [id number] DOB: [birth year] DOA: 09/21/2014  Referring physician: The patient was transferred from [facility] [place]. PCP: [name], MD   Chief Complaint: Chest pain.  HPI: [name] is a 69 y.o. male with history of CAD status post stenting, hypertension, OSA, pulmonary nodule presents to the ER because of chest pain. Patient states over the last 3 days patient has been having diarrhea and on the first day patient also had some nausea vomiting. Today around afternoon patient started developing chest pain which was retrosternal radiating to the right side of the upper abdominal quadrant. Patient's chest pain improved after patient took some Tylenol. In the ER patient was chest pain-free EKG showed first-degree AV block. Patient's LFTs were elevated but lipase was within acceptable limits. Patient was admitted for further management. Patient on my exam denies any chest pain denies any dizziness loss of consciousness. His last episode of diarrhea was around 1:00 before he came to the ER. Denies any recent sick contacts or travel. Abdomen on exam appears benign. After arrival patient is found to have a second-degree AV block Wenckebach type.   Review of Systems: As presented in the history of presenting illness, rest negative.  Past Medical History  Diagnosis Date  . Heart attack   . Obesity   . Hyperlipidemia   . OSA (obstructive sleep apnea)   . GERD (gastroesophageal reflux disease)   . Hypertension   . CAD (coronary artery disease)   . COPD (chronic obstructive pulmonary disease)   . Pneumonia   . Dyspnea   . Pulmonary nodule    Past Surgical History  Procedure Laterality Date  . Carotid stent    . Cholecystectomy    . Back surgery    . Colon surgery    . Appendectomy     Social History:  reports that he quit smoking about 31 years ago. His smoking use included Cigarettes. He has a 102 pack-year  smoking history. He does not have any smokeless tobacco history on file. He reports that he does not drink alcohol or use illicit drugs. Where does patient live home. Can patient participate in ADLs? Yes.  No Known Allergies  Family History:  Family History  Problem Relation Age of Onset  . COPD Sister   . Sleep apnea      3 siblings  . Hypertension Mother   . Coronary artery disease Mother   . Aneurysm Father       Prior to Admission medications   Medication Sig Start Date End Date Taking? Authorizing Provider  allopurinol (ZYLOPRIM) 300 MG tablet Take 1 tablet by mouth Daily. 11/07/11   Historical Provider, MD  amLODipine (NORVASC) 10 MG tablet Take 1 tablet by mouth Daily. 11/07/11   Historical Provider, MD  atorvastatin (LIPITOR) 20 MG tablet Take 20 mg by mouth daily.    Historical Provider, MD  hydrochlorothiazide (HYDRODIURIL) 25 MG tablet Take 25 mg by mouth daily.    Historical Provider, MD  losartan (COZAAR) 100 MG tablet Take 100 mg by mouth daily.    Historical Provider, MD  omeprazole (PRILOSEC) 20 MG capsule Take 20 mg by mouth daily.    Historical Provider, MD  Probiotic Product (PROBIOTIC DAILY PO) Take by mouth daily.    Historical Provider, MD    Physical Exam: Filed Vitals:   09/21/14 1730 09/21/14 1829 09/21/14 1903 09/21/14 2016  BP:  121/62 127/62 151/51  Pulse: 60 59 61 61  Temp:  97.9 F (36.6 C)  TempSrc:      Resp: 15 21 22 20   Height:    6\' 2"  (1.88 m)  Weight:    136.261 kg (300 lb 6.4 oz)  SpO2: 99% 98% 97% 95%     General:  Obese not in distress.  Eyes: Anicteric no pallor.  ENT: No discharge from the ears eyes nose or mouth.  Neck: No mass felt. No JVD appreciated.  Cardiovascular: S1 and S2 heard.  Respiratory: No rhonchi or crepitations.  Abdomen: Soft nontender bowel sounds present.  Skin: No rash.  Musculoskeletal: No edema.  Psychiatric: Appears normal.  Neurologic: Alert awake oriented to time place and person. Moves  all extremities.  Labs on Admission:  Basic Metabolic Panel:  Recent Labs Lab 09/21/14 1520  NA 137  K 3.1*  CL 105  CO2 24  GLUCOSE 111*  BUN 17  CREATININE 1.09  CALCIUM 8.8   Liver Function Tests:  Recent Labs Lab 09/21/14 1520  AST 203*  ALT 126*  ALKPHOS 81  BILITOT 0.7  PROT 6.7  ALBUMIN 3.6    Recent Labs Lab 09/21/14 1520  LIPASE 35   No results for input(s): AMMONIA in the last 168 hours. CBC:  Recent Labs Lab 09/21/14 1520  WBC 10.9*  NEUTROABS 7.2  HGB 15.8  HCT 46.8  MCV 86.7  PLT 181   Cardiac Enzymes:  Recent Labs Lab 09/21/14 1520  TROPONINI <0.03    BNP (last 3 results) No results for input(s): BNP in the last 8760 hours.  ProBNP (last 3 results) No results for input(s): PROBNP in the last 8760 hours.  CBG: No results for input(s): GLUCAP in the last 168 hours.  Radiological Exams on Admission: Dg Abd Acute W/chest  09/21/2014   CLINICAL DATA:  Right chest and upper abdominal pain with diarrhea and vomiting for 2 days.  EXAM: DG ABDOMEN ACUTE W/ 1V CHEST  COMPARISON:  CT abdomen and pelvis 05/02/2010.  FINDINGS: Single view of the chest demonstrates clear lungs and normal heart size. No pneumothorax or pleural effusion.  Two views of the abdomen show no free intraperitoneal air. The bowel gas pattern is unremarkable. Convex right scoliosis is noted. The patient is status post lower lumbar fusion. Multiple surgical clips are seen in the right upper quadrant of the abdomen.  IMPRESSION: No acute abnormality chest or abdomen.   Electronically Signed   By: Inge Rise M.D.   On: 09/21/2014 16:11    EKG: Independently reviewed. Initial EKG showed normal sinus rhythm with first-degree AV block. EKG done subsequently showed second-degree AV block Wenckebach.  Assessment/Plan Principal Problem:   Chest pain Active Problems:   Essential hypertension   OSA (obstructive sleep apnea)   Abnormal liver enzymes   Nausea vomiting and  diarrhea   Hyperlipidemia   Elevated LFTs   1. Chest pain - given history of CAD status post stenting and hypertension at this time we will cycle cardiac markers to rule out ACS. Patient will be kept nothing by mouth in a.m. in anticipation of possible procedures. Aspirin. Patient has not been taking aspirin for long time at this time. 2. Bradycardia - EKG shows second-degree Mobitz type I AV block. Patient's heart rate sometimes goes as low as in the 30s. I have discussed with on-call cardiologist Dr. Tommi Rumps who will be seeing patient in consult. 3. Elevated LFTs with nausea vomiting and diarrhea - abdomen appears benign. Patient has not had any recent hospitalization or exposure to  antibiotics. Patient has had previous cholecystectomy. At this time we will recheck LFTs. Check sonogram of the abdomen for any CBD obstruction. Recheck lipase levels. Check acute hepatitis panel and Tylenol levels. Stool studies. 4. Hyperlipidemia - since patient's LFTs elevated I'm holding off statin at this time. 5. OSA on C Pap. 6. History of pulmonary nodule followed by pulmonologist.    DVT Prophylaxis SCDs. If INR is normal may change to Lovenox. Code Status: Full code.  Family Communication: None.  Disposition Plan: Admit for observation.    Tanika Bracco N. [facility] Pager [phone].  If 7PM-7AM, please contact night-coverage [email or web address] Password [facility] 09/21/2014, 9:52 PM

## 2014-09-21 NOTE — Progress Notes (Signed)
Pt refusing cpap for tonight, encouraged him to call if he changes his mind throughout the night, he communicates understanding.

## 2014-09-22 DIAGNOSIS — R0789 Other chest pain: Secondary | ICD-10-CM

## 2014-09-22 DIAGNOSIS — I441 Atrioventricular block, second degree: Secondary | ICD-10-CM | POA: Insufficient documentation

## 2014-09-22 DIAGNOSIS — I10 Essential (primary) hypertension: Secondary | ICD-10-CM | POA: Diagnosis not present

## 2014-09-22 DIAGNOSIS — R197 Diarrhea, unspecified: Secondary | ICD-10-CM | POA: Diagnosis not present

## 2014-09-22 DIAGNOSIS — R001 Bradycardia, unspecified: Secondary | ICD-10-CM | POA: Diagnosis not present

## 2014-09-22 DIAGNOSIS — G4733 Obstructive sleep apnea (adult) (pediatric): Secondary | ICD-10-CM

## 2014-09-22 DIAGNOSIS — R112 Nausea with vomiting, unspecified: Secondary | ICD-10-CM | POA: Diagnosis not present

## 2014-09-22 DIAGNOSIS — R748 Abnormal levels of other serum enzymes: Secondary | ICD-10-CM | POA: Diagnosis not present

## 2014-09-22 DIAGNOSIS — E785 Hyperlipidemia, unspecified: Secondary | ICD-10-CM | POA: Diagnosis not present

## 2014-09-22 DIAGNOSIS — R079 Chest pain, unspecified: Secondary | ICD-10-CM | POA: Diagnosis not present

## 2014-09-22 LAB — CLOSTRIDIUM DIFFICILE BY PCR: CDIFFPCR: NEGATIVE

## 2014-09-22 LAB — BASIC METABOLIC PANEL
Anion gap: 8 (ref 5–15)
Anion gap: 2 — ABNORMAL LOW (ref 5–15)
BUN: 13 mg/dL (ref 6–23)
BUN: 12 mg/dL (ref 6–23)
CHLORIDE: 106 mmol/L (ref 96–112)
CO2: 22 mmol/L (ref 19–32)
CO2: 28 mmol/L (ref 19–32)
CREATININE: 1.02 mg/dL (ref 0.50–1.35)
Calcium: 8.6 mg/dL (ref 8.4–10.5)
Calcium: 8.8 mg/dL (ref 8.4–10.5)
Chloride: 108 mmol/L (ref 96–112)
Creatinine, Ser: 1.07 mg/dL (ref 0.50–1.35)
GFR calc non Af Amer: 68 mL/min — ABNORMAL LOW (ref >90)
GFR calc non Af Amer: 72 mL/min — ABNORMAL LOW (ref >90)
GFR, EST AFRICAN AMERICAN: 79 mL/min — AB (ref >90)
GFR, EST AFRICAN AMERICAN: 84 mL/min — AB (ref >90)
Glucose, Bld: 119 mg/dL — ABNORMAL HIGH (ref 70–99)
Glucose, Bld: 96 mg/dL (ref 70–99)
POTASSIUM: 3.5 mmol/L (ref 3.5–5.1)
POTASSIUM: 3.7 mmol/L (ref 3.5–5.1)
SODIUM: 136 mmol/L (ref 135–145)
SODIUM: 138 mmol/L (ref 135–145)

## 2014-09-22 LAB — CBC WITH DIFFERENTIAL/PLATELET
Basophils Absolute: 0 10*3/uL (ref 0.0–0.1)
Basophils Relative: 0 % (ref 0–1)
Eosinophils Absolute: 0.2 10*3/uL (ref 0.0–0.7)
Eosinophils Relative: 3 % (ref 0–5)
HCT: 43.6 % (ref 39.0–52.0)
Hemoglobin: 14.7 g/dL (ref 13.0–17.0)
LYMPHS ABS: 2.4 10*3/uL (ref 0.7–4.0)
Lymphocytes Relative: 37 % (ref 12–46)
MCH: 29.1 pg (ref 26.0–34.0)
MCHC: 33.7 g/dL (ref 30.0–36.0)
MCV: 86.2 fL (ref 78.0–100.0)
MONOS PCT: 12 % (ref 3–12)
Monocytes Absolute: 0.8 10*3/uL (ref 0.1–1.0)
NEUTROS PCT: 48 % (ref 43–77)
Neutro Abs: 3.1 10*3/uL (ref 1.7–7.7)
Platelets: 172 10*3/uL (ref 150–400)
RBC: 5.06 MIL/uL (ref 4.22–5.81)
RDW: 14.2 % (ref 11.5–15.5)
WBC: 6.5 10*3/uL (ref 4.0–10.5)

## 2014-09-22 LAB — HEPATIC FUNCTION PANEL
ALK PHOS: 86 U/L (ref 39–117)
ALT: 207 U/L — AB (ref 0–53)
AST: 190 U/L — AB (ref 0–37)
Albumin: 3.2 g/dL — ABNORMAL LOW (ref 3.5–5.2)
Bilirubin, Direct: 0.3 mg/dL (ref 0.0–0.5)
Indirect Bilirubin: 0.7 mg/dL (ref 0.3–0.9)
TOTAL PROTEIN: 6.1 g/dL (ref 6.0–8.3)
Total Bilirubin: 1 mg/dL (ref 0.3–1.2)

## 2014-09-22 LAB — ACETAMINOPHEN LEVEL: Acetaminophen (Tylenol), Serum: <10 ug/mL — ABNORMAL LOW (ref 10–30)

## 2014-09-22 LAB — TROPONIN I
Troponin I: <0.03 ng/mL (ref <0.031)
Troponin I: <0.03 ng/mL (ref <0.031)

## 2014-09-22 LAB — HEPATITIS PANEL, ACUTE
HCV Ab: NEGATIVE
Hep A IgM: NONREACTIVE
Hep B C IgM: NONREACTIVE
Hepatitis B Surface Ag: NEGATIVE

## 2014-09-22 LAB — PROTIME-INR
INR: 1.13 (ref 0.00–1.49)
PROTHROMBIN TIME: 14.7 s (ref 11.6–15.2)

## 2014-09-22 LAB — MAGNESIUM: Magnesium: 1.7 mg/dL (ref 1.5–2.5)

## 2014-09-22 LAB — TSH: TSH: 3.961 u[IU]/mL (ref 0.350–4.500)

## 2014-09-22 LAB — MRSA PCR SCREENING: MRSA BY PCR: NEGATIVE

## 2014-09-22 MED ORDER — ATROPINE SULFATE 0.1 MG/ML IJ SOLN
INTRAMUSCULAR | Status: AC
  Filled 2014-09-22: qty 10

## 2014-09-22 NOTE — Progress Notes (Signed)
Subjective:  Patient admitted last night with the GI type illness but was found to have bradycardia and cardiac consult was done.  He also had atypical right-sided chest pain completely different than his previous cardiac type pain.  Prior to admission he had started exercising was able to work out for 30 minutes at the gym without symptoms.  He doesn't have syncope or any dizziness and his bradycardia in the past has been asymptomatic.  Overnight his heart rate has shown episodes of Mobitz 1 heart block and he has occasionally had heart rates as low as 30-40 briefly.  Not symptomatic and much of this occurred while he was sleeping.  His diarrhea has gotten better although his transaminases are now elevated.  Objective:  Vital Signs in the last 24 hours: BP 137/70 mmHg  Pulse 60  Temp(Src) 97.8 F (36.6 C) (Oral)  Resp 25  Ht 6\' 2"  (1.88 m)  Wt 138.6 kg (305 lb 8.9 oz)  BMI 39.21 kg/m2  SpO2 97%  Physical Exam: Morbidly obese white male currently pleasant and in no acute distress Lungs:  Clear Cardiac:  Somewhat irregular rhythm, normal S1 and S2, no S3 Abdomen:  Soft, nontender, no masses Extremities:  No edema present  Intake/Output from previous day:    Weight Filed Weights   09/21/14 1429 09/21/14 2016 09/22/14 0500  Weight: 142.883 kg (315 lb) 136.261 kg (300 lb 6.4 oz) 138.6 kg (305 lb 8.9 oz)    Lab Results: Basic Metabolic Panel:  Recent Labs  09/21/14 2310 09/22/14 0411  NA 136 138  K 3.5 3.7  CL 106 108  CO2 22 28  GLUCOSE 119* 96  BUN 13 12  CREATININE 1.07 1.02   CBC:  Recent Labs  09/21/14 1520 09/22/14 0411  WBC 10.9* 6.5  NEUTROABS 7.2 3.1  HGB 15.8 14.7  HCT 46.8 43.6  MCV 86.7 86.2  PLT 181 172   Cardiac Panel (last 3 results)  Recent Labs  09/21/14 2310 09/22/14 0411 09/22/14 0850  TROPONINI <0.03 <0.03 <0.03    Telemetry: Sinus with first-degree AV block and episodes of Mobitz 1 heart block.  Heart rate is currently  64  Assessment/Plan:  1.  Conduction system disease with first-degree and Mobitz 1 heart block which is currently asymptomatic with no previous symptoms of syncope or dizziness 2.  Coronary artery disease with previous infarction and previous PCI without symptoms 3.  Morbid obesity 4.  Sleep apnea that may be contributing to arrhythmias 5.  Hypertension  Recommendations:  I would not do any further workup of the atypical chest pain at this time.  If his body habitus he is not a good candidate for myocardial perfusion imaging.  He has not had any cardiac symptoms recently.  He will need to have his transaminase elevation evaluated and worked up.  I don't see an indication for pacemaker at this time but I will ask the electrophysiology colleagues to comment on the need for this.  We might be able to discontinue telemetry if he remains asymptomatic.     Kerry Hough  MD [facility]  09/22/2014, 1:11 PM

## 2014-09-22 NOTE — Progress Notes (Signed)
[facility] PROGRESS NOTE  [name] [id number] DOB: September 01, 1945 DOA: 09/21/2014 PCP: [name], MD  Assessment/Plan: 1. 2nd degree AV block, Mobitz I.  -asymptomatic -numerous pauses <3sec and bradycardia  -TSh normal -ECHO pending -Cards following  2. Chest pain in setting of CAD s/p MI x 2 with PCI to LCx (1998) and LAD (2007) -atypical, resolved, troponin negative -FU echo   3. Nausea/Vomiting/diarrhea -resolved, suspect Viral gastroenteritis, wife sick with same  40. Elevated LFts -baseline LFTs unknown, could have NASH -FU RUQ [place], bili and Alkaline phosphatase normal suggesting against obstruction -statin held, h/o cholecystectomy  5. OSA on C Pap.  6. History of pulmonary nodule followed by pulmonologist.  Code Status: Full Code Family Communication: family at bedside Disposition Plan: home pending plan per Cards   Consultants:  Cards  HPI/Subjective: Feels well, no N/V or diarrhea since yesterday noon  Objective: Filed Vitals:   09/22/14 0752  BP: 137/70  Pulse: 60  Temp: 97.8 F (36.6 C)  Resp: 25   No intake or output data in the 24 hours ending 09/22/14 1231 Filed Weights   09/21/14 1429 09/21/14 2016 09/22/14 0500  Weight: 142.883 kg (315 lb) 136.261 kg (300 lb 6.4 oz) 138.6 kg (305 lb 8.9 oz)    Exam:   General:  AAOx3, no distress  Cardiovascular: S1S2/RRR  Respiratory: CTAB  Abdomen: soft, obese, Nt, BS present  Musculoskeletal: no edema c/c   Data Reviewed: Basic Metabolic Panel:  Recent Labs Lab 09/21/14 1520 09/21/14 2310 09/22/14 0411  NA 137 136 138  K 3.1* 3.5 3.7  CL 105 106 108  CO2 24 22 28   GLUCOSE 111* 119* 96  BUN 17 13 12   CREATININE 1.09 1.07 1.02  CALCIUM 8.8 8.6 8.8  MG  --  1.7  --    Liver Function Tests:  Recent Labs Lab 09/21/14 1520 09/22/14 0411  AST 203* 190*  ALT 126* 207*  ALKPHOS 81 86  BILITOT 0.7 1.0  PROT 6.7 6.1  ALBUMIN 3.6 3.2*    Recent  Labs Lab 09/21/14 1520  LIPASE 35   No results for input(s): AMMONIA in the last 168 hours. CBC:  Recent Labs Lab 09/21/14 1520 09/22/14 0411  WBC 10.9* 6.5  NEUTROABS 7.2 3.1  HGB 15.8 14.7  HCT 46.8 43.6  MCV 86.7 86.2  PLT 181 172   Cardiac Enzymes:  Recent Labs Lab 09/21/14 1520 09/21/14 2310 09/22/14 0411 09/22/14 0850  TROPONINI <0.03 <0.03 <0.03 <0.03   BNP (last 3 results) No results for input(s): BNP in the last 8760 hours.  ProBNP (last 3 results) No results for input(s): PROBNP in the last 8760 hours.  CBG: No results for input(s): GLUCAP in the last 168 hours.  Recent Results (from the past 240 hour(s))  MRSA PCR Screening     Status: None   Collection Time: 09/22/14  4:40 AM  Result Value Ref Range Status   MRSA by PCR NEGATIVE NEGATIVE Final    Comment:        The GeneXpert MRSA Assay (FDA approved for NASAL specimens only), is one component of a comprehensive MRSA colonization surveillance program. It is not intended to diagnose MRSA infection nor to guide or monitor treatment for MRSA infections.      Studies: Dg Abd Acute W/chest  09/21/2014   CLINICAL DATA:  Right chest and upper abdominal pain with diarrhea and vomiting for 2 days.  EXAM: DG ABDOMEN ACUTE W/ 1V CHEST  COMPARISON:  CT  abdomen and pelvis 05/02/2010.  FINDINGS: Single view of the chest demonstrates clear lungs and normal heart size. No pneumothorax or pleural effusion.  Two views of the abdomen show no free intraperitoneal air. The bowel gas pattern is unremarkable. Convex right scoliosis is noted. The patient is status post lower lumbar fusion. Multiple surgical clips are seen in the right upper quadrant of the abdomen.  IMPRESSION: No acute abnormality chest or abdomen.   Electronically Signed   By: Inge Rise M.D.   On: 09/21/2014 16:11    Scheduled Meds: . allopurinol  300 mg Oral Daily  . amLODipine  10 mg Oral Daily  . aspirin EC  325 mg Oral Daily  .  atropine      . hydrochlorothiazide  25 mg Oral Daily  . losartan  100 mg Oral Daily  . pantoprazole  40 mg Oral Daily  . sodium chloride  3 mL Intravenous Q12H   Continuous Infusions:  Antibiotics Given (last 72 hours)    None      Principal Problem:   Chest pain Active Problems:   Essential hypertension   OSA (obstructive sleep apnea)   Abnormal liver enzymes   Nausea vomiting and diarrhea   Hyperlipidemia   Elevated LFTs    Time spent: 109min    [name]  [facility] Pager [phone]. If 7PM-7AM, please contact night-coverage at [email or web address], password [facility] 09/22/2014, 12:31 PM  LOS: 1 day

## 2014-09-22 NOTE — Progress Notes (Signed)
Pt heart rhythm showed 2nd degree heart block type 1 on ekg. Called md made him aware. Dr. To consult cardiology. 1130pm pt heart rate dropped to 28-30's. Called Dr. Hal Hope to make him aware. Was informed to page cardiology. Cardiology her to unit to see pt. Will transfer to 3west stepdown for closer telemetry monitoring.

## 2014-09-22 NOTE — Plan of Care (Signed)
Cardiologist called this NP and asked that pt be transferred to SDU for closer monitoring tonight. Pt for TEE in am.  See cards note. KJKG, NP [facility]

## 2014-09-22 NOTE — Consult Note (Signed)
ELECTROPHYSIOLOGY CONSULT NOTE    Patient ID: [name] MRN: [id number], DOB/AGE: 03-05-1946 69 y.o.  Admit date: 09/21/2014 Date of Consult: 09/22/2014  Primary Physician: [name], MD Primary Cardiologist: [name]  Reason for Consultation: bradycardia  HPI:  [name] is a 69 y.o. male with a past medical history significant for sleep apnea (on CPAP), COPD, HTN, hyperlipidemia, and CAD. He was admitted 09-21-14 with GI illness and was found to have Mobitz I heart block.  He has been monitored on telemetry which has demonstrated sinus rhythm with occasional asymptomatic nocturnal sinus bradycardia. EP has been asked to evaluate for treatment options.   Last echo available in [facility] 2010 demonstrated EF 50-55%, no RWMA, LA moderately to severely dilated, small pericardial effusion.  Last cath 2007 demonstrated successful stenting of proximal LAD and mid circumflex; EF 60%.  Lab work is reviewed.   Past Medical History  Diagnosis Date  . Heart attack   . Obesity   . Hyperlipidemia   . OSA (obstructive sleep apnea)   . GERD (gastroesophageal reflux disease)   . Hypertension   . CAD (coronary artery disease)   . COPD (chronic obstructive pulmonary disease)   . Pneumonia   . Dyspnea   . Pulmonary nodule      Surgical History:  Past Surgical History  Procedure Laterality Date  . Carotid stent    . Cholecystectomy    . Back surgery    . Colon surgery    . Appendectomy       Prescriptions prior to admission  Medication Sig Dispense Refill Last Dose  . acetaminophen (TYLENOL) 500 MG tablet Take 500 mg by mouth every 6 (six) hours as needed for moderate pain.   Past Week at Unknown time  . allopurinol (ZYLOPRIM) 300 MG tablet Take 300 mg by mouth Daily.    09/21/2014 at Unknown time  . amLODipine (NORVASC) 10 MG tablet Take 10 mg by mouth Daily.    09/21/2014 at Unknown time  . atorvastatin (LIPITOR) 20 MG tablet Take 20 mg by mouth daily.   09/21/2014 at  Unknown time  . hydrochlorothiazide (HYDRODIURIL) 25 MG tablet Take 25 mg by mouth daily.   09/21/2014 at Unknown time  . losartan (COZAAR) 100 MG tablet Take 100 mg by mouth daily.   09/21/2014 at Unknown time  . omeprazole (PRILOSEC) 20 MG capsule Take 20 mg by mouth daily.   09/21/2014 at Unknown time    Inpatient Medications:  . allopurinol  300 mg Oral Daily  . amLODipine  10 mg Oral Daily  . aspirin EC  325 mg Oral Daily  . hydrochlorothiazide  25 mg Oral Daily  . losartan  100 mg Oral Daily  . pantoprazole  40 mg Oral Daily  . sodium chloride  3 mL Intravenous Q12H    Allergies: No Known Allergies  History   Social History  . Marital Status: Married    Spouse Name: N/A  . Number of Children: 2  . Years of Education: N/A   Occupational History  . retired    Social History Main Topics  . Smoking status: Former Smoker -- 3.00 packs/day for 34 years    Types: Cigarettes    Quit date: 06/17/1983  . Smokeless tobacco: Not on file  . Alcohol Use: No  . Drug Use: No  . Sexual Activity: Not on file   Other Topics Concern  . Not on file   Social History Narrative     Family  History  Problem Relation Age of Onset  . COPD Sister   . Sleep apnea      3 siblings  . Hypertension Mother   . Coronary artery disease Mother   . Aneurysm Father      Review of Systems: All other systems reviewed and are otherwise negative except as noted above.  Physical Exam: Filed Vitals:   09/22/14 0239 09/22/14 0500 09/22/14 0752 09/22/14 1413  BP:  134/64 137/70 128/62  Pulse:  46 60 65  Temp:  97.8 F (36.6 C) 97.8 F (36.6 C) 97.8 F (36.6 C)  TempSrc:  Oral Oral Oral  Resp:  22 25 20   Height:      Weight:  305 lb 8.9 oz (138.6 kg)    SpO2: 97% 97% 97% 95%    GEN- The patient is morbidly obese, alert and oriented x 3 today.   HEENT: normocephalic, atraumatic; sclera clear, conjunctiva pink; hearing intact; oropharynx clear; neck supple, no JVP Lymph- no cervical  lymphadenopathy Lungs- Clear to ausculation bilaterally, normal work of breathing.  No wheezes, rales, rhonchi Heart- Regular rate and rhythm, no murmurs, rubs or gallops, PMI not laterally displaced GI- soft, non-tender, non-distended, bowel sounds present, no hepatosplenomegaly Extremities- no clubbing, cyanosis, or edema; DP/PT/radial pulses 2+ bilaterally MS- no significant deformity or atrophy Skin- warm and dry, no rash or lesion Psych- euthymic mood, full affect Neuro- strength and sensation are intact  Labs:   Lab Results  Component Value Date   WBC 6.5 09/22/2014   HGB 14.7 09/22/2014   HCT 43.6 09/22/2014   MCV 86.2 09/22/2014   PLT 172 09/22/2014    Recent Labs Lab 09/22/14 0411  NA 138  K 3.7  CL 108  CO2 28  BUN 12  CREATININE 1.02  CALCIUM 8.8  PROT 6.1  BILITOT 1.0  ALKPHOS 86  ALT 207*  AST 190*  GLUCOSE 96      Radiology/Studies: Dg Abd Acute W/chest 09/21/2014   CLINICAL DATA:  Right chest and upper abdominal pain with diarrhea and vomiting for 2 days.  EXAM: DG ABDOMEN ACUTE W/ 1V CHEST  COMPARISON:  CT abdomen and pelvis 05/02/2010.  FINDINGS: Single view of the chest demonstrates clear lungs and normal heart size. No pneumothorax or pleural effusion.  Two views of the abdomen show no free intraperitoneal air. The bowel gas pattern is unremarkable. Convex right scoliosis is noted. The patient is status post lower lumbar fusion. Multiple surgical clips are seen in the right upper quadrant of the abdomen.  IMPRESSION: No acute abnormality chest or abdomen.   Electronically Signed   By: Inge Rise M.D.   On: 09/21/2014 16:11    EKG: Mobitz I heart block, rate 45, narrow QRS  TELEMETRY: sinus rhythm with occasional Mobitz I heart block  Assessment/Plan: 1.  Mobitz I heart block There is no indication for pacing with asymptomatic bradycardia and Mobitz I heart block.  Sleep apnea likely contributing to nocturnal bradycardia.  TSH normal.   2.   CAD No recent ischemic symptoms.  CP last week in the setting of nausea/ GI illness is now resolved.  3.  Morbid obesity Weight loss encouraged  4.  Sleep apnea Compliance with CPAP encouraged  5.  Hypertension Stable No change required today  Electrophysiology team to see as needed while here. Please call with questions.   Signed, Chanetta Marshall, NP 09/22/2014 2:24 PM  I have seen, examined the patient, and reviewed the above assessment and plan.  Changes to above are made where necessary.  I agree with Dr [name] that there is no indication for pacing at his time.  Mobitz I second degree AV block is likely exacerbated by GI illness but is also common in the setting of morbid obesity and sleep apnea.  He is asymptomatic.  No further EP workup planned.  Avoid AV nodal agents.  Use CPAP when asleep. Weight loss is encouraged  Electrophysiology team to see as needed while here. Please call with questions.   Co Sign: Thompson Grayer, MD 09/22/2014 4:29 PM

## 2014-09-22 NOTE — Progress Notes (Signed)
Placed patient on CPAP for the night.  Patient is tolerating well at this time. 

## 2014-09-22 NOTE — Progress Notes (Signed)
UR completed 

## 2014-09-22 NOTE — Consult Note (Addendum)
CARDIOLOGY CONSULT NOTE   Patient ID: [name] MRN: [id number], DOB/AGE: 70-Aug-1947   Admit date: 09/21/2014 Date of Consult: 09/22/2014   Primary Physician: [name], MD Primary Cardiologist: Ezzard Standing, M.D.  Pt. Profile  62 obese male with OSA on CPAP, COPD, HTN, HLD, CAD s/p MI x 2 with PCI to LCx (1998) and LAD (2007) who presents with nausea, vomiting, diarrhea, chest pain, and Mobitz I AV block.  Problem List  Past Medical History  Diagnosis Date  . Heart attack   . Obesity   . Hyperlipidemia   . OSA (obstructive sleep apnea)   . GERD (gastroesophageal reflux disease)   . Hypertension   . CAD (coronary artery disease)   . COPD (chronic obstructive pulmonary disease)   . Pneumonia   . Dyspnea   . Pulmonary nodule     Past Surgical History  Procedure Laterality Date  . Carotid stent    . Cholecystectomy    . Back surgery    . Colon surgery    . Appendectomy       Allergies  No Known Allergies  HPI   45 obese male with OSA on CPAP, COPD, HTN, HLD, CAD s/p MI x 2 with PCI to LCx (1998) and LAD (2007) who presents with nausea, vomiting, diarrhea, and chest pain.  [name] reports that on Tuesday he woke at 4AM with nausea, vomiting, and diarrhea. He stated he "went to the bathroom all day." On Wednesday, he felt better and went to [facility] and got a coffee and some food. He was able to eat very little and again developed nausea, vomiting, and diarrhea. He had some chills on Wed too. He reported ongoing diarrea (8-9x) on Thursday without nausea or vomiting. His wife had a similar GI illness.  However, on Thursday, he did develop a pain immediately below his right costal margin which was 8-9/10 in severity, radiated to the back, and felt "like I was bloated." No clear modifying factors. The pain did not feel like his prior MIs. He took 3 tylenols and 2 tums and did not get any relief. Pain lasted ~1hour. He was concerned about the right  side pain and came to the ER.   On arrival, he was hemodynamically stable. ECG demonstrated NSR, marked 1st degree AV block (PR 339ms), NSSTTWC. Labs were notable for K 3.1, Cr 1.09, AST 203, ALT 126, TnI <0.03. He was admitted to medicine for evaluation of GI illness and transaminitis. Continuous tele monitoring demonstrated periods of 2nd degree AV block. ECGs (21:42) conformed Mobitz I block.  He was completely asymptomatic with this rhythm. Cardiology was consulted for AV block.   Of note, [name] has no history of syncope. He is not on any nodal agents and has apparently had low heart rates in the past. He reported occasional ankle swelling.   Inpatient Medications  . allopurinol  300 mg Oral Daily  . amLODipine  10 mg Oral Daily  . aspirin EC  325 mg Oral Daily  . hydrochlorothiazide  25 mg Oral Daily  . losartan  100 mg Oral Daily  . pantoprazole  40 mg Oral Daily  . sodium chloride  3 mL Intravenous Q12H    Family History Family History  Problem Relation Age of Onset  . COPD Sister   . Sleep apnea      3 siblings  . Hypertension Mother   . Coronary artery disease Mother   . Aneurysm Father  Social History History   Social History  . Marital Status: Married    Spouse Name: N/A  . Number of Children: 2  . Years of Education: N/A   Occupational History  . retired    Social History Main Topics  . Smoking status: Former Smoker -- 3.00 packs/day for 34 years    Types: Cigarettes    Quit date: 06/17/1983  . Smokeless tobacco: Not on file  . Alcohol Use: No  . Drug Use: No  . Sexual Activity: Not on file   Other Topics Concern  . Not on file   Social History Narrative     Review of Systems  General:  No chills, fever, night sweats or weight changes.  Cardiovascular:  No dyspnea on exertion, edema, orthopnea, palpitations, paroxysmal nocturnal dyspnea. R costal margin pain.  Dermatological: No rash, lesions/masses Respiratory: No cough,  dyspnea Urologic: No hematuria, dysuria Abdominal:   + nausea, vomiting, diarrhea. No  bright red blood per rectum, melena, or hematemesis Neurologic:  No visual changes, wkns, changes in mental status. All other systems reviewed and are otherwise negative except as noted above.  Physical Exam  Blood pressure 151/51, pulse 61, temperature 97.9 F (36.6 C), temperature source Oral, resp. rate 20, height 6\' 2"  (1.88 m), weight 136.261 kg (300 lb 6.4 oz), SpO2 95 %.  General: Pleasant, NAD Psych: Normal affect. Neuro: Alert and oriented X 3. Moves all extremities spontaneously. HEENT: Normal  Neck: Supple without bruits or JVD. Lungs:  Resp regular and unlabored, CTA. Heart: RRR no s3, s4, or murmurs. Abdomen: Soft, non-tender, non-distended, BS + x 4. Murphy's sign negative.  Extremities: No clubbing, cyanosis or edema. DP/PT/Radials 2+ and equal bilaterally.  Labs   Recent Labs  09/21/14 1520  TROPONINI <0.03   Lab Results  Component Value Date   WBC 10.9* 09/21/2014   HGB 15.8 09/21/2014   HCT 46.8 09/21/2014   MCV 86.7 09/21/2014   PLT 181 09/21/2014    Recent Labs Lab 09/21/14 1520  NA 137  K 3.1*  CL 105  CO2 24  BUN 17  CREATININE 1.09  CALCIUM 8.8  PROT 6.7  BILITOT 0.7  ALKPHOS 81  ALT 126*  AST 203*  GLUCOSE 111*   Lab Results  Component Value Date   CHOL  03/25/2009    128        ATP III CLASSIFICATION:  <200     mg/dL   Desirable  200-239  mg/dL   Borderline High  >=240    mg/dL   High          HDL 27* 03/25/2009   LDLCALC  03/25/2009    70        Total Cholesterol/HDL:CHD Risk Coronary Heart Disease Risk Table                     Men   Women  1/2 Average Risk   3.4   3.3  Average Risk       5.0   4.4  2 X Average Risk   9.6   7.1  3 X Average Risk  23.4   11.0        Use the calculated Patient Ratio above and the CHD Risk Table to determine the patient's CHD Risk.        ATP III CLASSIFICATION (LDL):  <100     mg/dL   Optimal   100-129  mg/dL   Near or Above  Optimal  130-159  mg/dL   Borderline  160-189  mg/dL   High  >190     mg/dL   Very High   TRIG 157* 03/25/2009   No results found for: DDIMER  Radiology/Studies  Dg Abd Acute W/chest  09/21/2014   CLINICAL DATA:  Right chest and upper abdominal pain with diarrhea and vomiting for 2 days.  EXAM: DG ABDOMEN ACUTE W/ 1V CHEST  COMPARISON:  CT abdomen and pelvis 05/02/2010.  FINDINGS: Single view of the chest demonstrates clear lungs and normal heart size. No pneumothorax or pleural effusion.  Two views of the abdomen show no free intraperitoneal air. The bowel gas pattern is unremarkable. Convex right scoliosis is noted. The patient is status post lower lumbar fusion. Multiple surgical clips are seen in the right upper quadrant of the abdomen.  IMPRESSION: No acute abnormality chest or abdomen.   Electronically Signed   By: Inge Rise M.D.   On: 09/21/2014 16:11    ECG  09/21/14 @ 14:31 - NSR, marked 1st degree AV block (PR 369ms), NSSTTWC 09/20/24 @ 21:42: Mobitz I 2nd degree HB. HR 48 NSSTTWC 09/20/24 @ 21:42: Mobitz I 2nd degree HB. HR 45 NSSTTWC  Cardiac Cath 01/29/2006 The aortic valve was normal. The mitral valve was normal. The left ventricle appeared normal in size. LVH was noted. Estimated ejection fraction was 60%. Coronary arteries arises and distributes normally. The left main coronary artery was normal. Left anterior descending has a hazy 70% eccentric proximal stenosis prior to the septal perforator. The vessel terminates in the distal anterior wall. The circumflex coronary artery stent that was previously placed was widely patent. There was a more distal 90-95% stenosis in the mid portion of the marginal branch. The right coronary artery was a large dominant vessel. The posterior descending supplied the apex. There was a mid vessel 50-60% stenosis which was thought to be borderline significant. Post  dilatation angiograms of the LAD stenosis showed 0% residual stenosis and preservation of all side branches. The circumflex had 0% residual stenosis.  IMPRESSION: 1. Successful stenting with nondrug-eluting stents of the proximal LAD and  the mid circumflex marginal branch. 2. Residual coronary disease involving the right coronary artery.   ASSESSMENT AND PLAN  57 obese male with OSA on CPAP, COPD, HTN, HLD, CAD s/p MI x 2 with PCI to LCx (1998) and LAD (2007) who presents with nausea, vomiting, diarrhea, chest pain, and Mobitz I AV block.   2nd degree AV block, Mobitz I.  The AV block is completely asymptomatic. Mobitz I block is often due to vagal tone and is much less likely to progress to CHB. It is possible that the GI illness is predisposing towards this rhythm, although only time will tell. HR nadir in the 30s at times. This rhythm can be seen in the setting of RCA stenosis (see below).  1. Would transfer to the SDU for close telemetry monitoring 2. Avoid any AV nodal agents 3. Check TSH 4. TTE 5. Will continue to assess need for pacer  Chest pain in setting of CAD s/p MI x 2 with PCI to LCx (1998) and LAD (2007). The CP is fairly atypical in nature and inconsistent with prior MIs. However given history, it is reasonable to consider CAD, particularly in light of his AV block which can be seen in RCA stenosis.  1. Cycle cardiac enzymes 2. TTE   3. May require ischemic evaluation depending on clinical course.   Evaluation and treatment of  GI illness and transaminitis per medicine.   Tobi Bastos, MD 09/22/2014, 12:34 AM

## 2014-09-23 ENCOUNTER — Observation Stay (HOSPITAL_COMMUNITY): Payer: Commercial Managed Care - HMO

## 2014-09-23 DIAGNOSIS — E785 Hyperlipidemia, unspecified: Secondary | ICD-10-CM | POA: Diagnosis not present

## 2014-09-23 DIAGNOSIS — I10 Essential (primary) hypertension: Secondary | ICD-10-CM | POA: Diagnosis not present

## 2014-09-23 DIAGNOSIS — R079 Chest pain, unspecified: Secondary | ICD-10-CM | POA: Diagnosis not present

## 2014-09-23 DIAGNOSIS — R7989 Other specified abnormal findings of blood chemistry: Secondary | ICD-10-CM | POA: Diagnosis not present

## 2014-09-23 DIAGNOSIS — R748 Abnormal levels of other serum enzymes: Secondary | ICD-10-CM | POA: Diagnosis not present

## 2014-09-23 DIAGNOSIS — R112 Nausea with vomiting, unspecified: Secondary | ICD-10-CM | POA: Diagnosis not present

## 2014-09-23 DIAGNOSIS — R197 Diarrhea, unspecified: Secondary | ICD-10-CM | POA: Diagnosis not present

## 2014-09-23 DIAGNOSIS — I441 Atrioventricular block, second degree: Secondary | ICD-10-CM | POA: Diagnosis not present

## 2014-09-23 DIAGNOSIS — R072 Precordial pain: Secondary | ICD-10-CM

## 2014-09-23 DIAGNOSIS — R001 Bradycardia, unspecified: Secondary | ICD-10-CM | POA: Diagnosis not present

## 2014-09-23 DIAGNOSIS — Z9889 Other specified postprocedural states: Secondary | ICD-10-CM | POA: Diagnosis not present

## 2014-09-23 LAB — COMPREHENSIVE METABOLIC PANEL
ALK PHOS: 84 U/L (ref 39–117)
ALT: 161 U/L — ABNORMAL HIGH (ref 0–53)
ANION GAP: 3 — AB (ref 5–15)
AST: 87 U/L — ABNORMAL HIGH (ref 0–37)
Albumin: 3.2 g/dL — ABNORMAL LOW (ref 3.5–5.2)
BUN: 17 mg/dL (ref 6–23)
CHLORIDE: 106 mmol/L (ref 96–112)
CO2: 27 mmol/L (ref 19–32)
Calcium: 9.1 mg/dL (ref 8.4–10.5)
Creatinine, Ser: 1.13 mg/dL (ref 0.50–1.35)
GFR, EST AFRICAN AMERICAN: 74 mL/min — AB (ref >90)
GFR, EST NON AFRICAN AMERICAN: 64 mL/min — AB (ref >90)
Glucose, Bld: 111 mg/dL — ABNORMAL HIGH (ref 70–99)
Potassium: 3.6 mmol/L (ref 3.5–5.1)
SODIUM: 136 mmol/L (ref 135–145)
Total Bilirubin: 0.8 mg/dL (ref 0.3–1.2)
Total Protein: 6 g/dL (ref 6.0–8.3)

## 2014-09-23 LAB — CBC
HCT: 42.5 % (ref 39.0–52.0)
HEMOGLOBIN: 14.3 g/dL (ref 13.0–17.0)
MCH: 29.1 pg (ref 26.0–34.0)
MCHC: 33.6 g/dL (ref 30.0–36.0)
MCV: 86.4 fL (ref 78.0–100.0)
Platelets: 192 10*3/uL (ref 150–400)
RBC: 4.92 MIL/uL (ref 4.22–5.81)
RDW: 14.2 % (ref 11.5–15.5)
WBC: 7.6 10*3/uL (ref 4.0–10.5)

## 2014-09-23 MED ORDER — ATORVASTATIN CALCIUM 20 MG PO TABS: 10.0000 mg | ORAL_TABLET | Freq: Every day | ORAL | Status: DC

## 2014-09-23 NOTE — Discharge Summary (Signed)
Physician Discharge Summary  [name] [id number] DOB: 12-05-1945 DOA: 09/21/2014  PCP: [name], MD  Admit date: 09/21/2014 Discharge date: 09/23/2014  Time spent: 45 minutes  Recommendations for Outpatient Follow-up:  1. Dr.Reade in 1 week, Fu LFTs/Cmet, if LFTs do not trend down stop statin 2. Dr.Tilley in 55month  Discharge Diagnoses:  Principal Problem:   Chest pain   Sinus Bradycardia   Essential hypertension   OSA (obstructive sleep apnea)   Abnormal liver enzymes   Nausea vomiting and diarrhea   Hyperlipidemia   Elevated LFTs   Second degree Mobitz I AV block   Fatty Liver  Discharge Condition: stable  Diet recommendation: heart healthy  Filed Weights   09/21/14 2016 09/22/14 0500 09/23/14 0628  Weight: 136.261 kg (300 lb 6.4 oz) 138.6 kg (305 lb 8.9 oz) 136.17 kg (300 lb 3.2 oz)    History of present illness:  Chief Complaint: Chest pain. HPI: [name] is a 69 y.o. male with history of CAD status post stenting, hypertension, OSA, pulmonary nodule presented to the ER because of chest pain, he was having N/V/D for 3days   In the ER patient was chest pain-free EKG showed first-degree AV block. Patient's LFTs were elevated but lipase was within acceptable limits.  Hospital Course:  1. 2nd degree AV block, Mobitz I.  -asymptomatic -Sinus bradycardia with some 2sec pauses  -TSh normal -seen by Cardiology, recommended Rx of OSA and weight loss -Seen by EP, no indication for pacer  2. Chest pain in setting of CAD s/p MI x 2 with PCI to LCx (1998) and LAD (2007) -atypical, resolved, troponin negative -Seen by Dr.Tilley with cardiology, Fu as outpatient  3. Nausea/Vomiting/diarrhea -resolved, suspect Viral gastroenteritis, wife sick with same  4. Elevated LFts -baseline LFTs unknown, improving -statin dose cut down -due to NASH, fatty liver on [place]. -Needs this followed and statins stopped if doesn't continue to improve  5. OSA on C  Pap.  6. History of pulmonary nodule  -followed by pulmonologist.   Discharge Exam: Filed Vitals:   09/23/14 1015  BP: 145/65  Pulse: 54  Temp: 97.5 F (36.4 C)  Resp: 18    General: AAOx3 Cardiovascular: S1S2/RRR Respiratory: CTAB  Discharge Instructions   Discharge Instructions    Diet - low sodium heart healthy    Complete by:  As directed      Increase activity slowly    Complete by:  As directed           Current Discharge Medication List    CONTINUE these medications which have CHANGED   Details  atorvastatin (LIPITOR) 20 MG tablet Take 0.5 tablets (10 mg total) by mouth daily.      CONTINUE these medications which have NOT CHANGED   Details  acetaminophen (TYLENOL) 500 MG tablet Take 500 mg by mouth every 6 (six) hours as needed for moderate pain.    allopurinol (ZYLOPRIM) 300 MG tablet Take 300 mg by mouth Daily.     amLODipine (NORVASC) 10 MG tablet Take 10 mg by mouth Daily.     hydrochlorothiazide (HYDRODIURIL) 25 MG tablet Take 25 mg by mouth daily.    losartan (COZAAR) 100 MG tablet Take 100 mg by mouth daily.    omeprazole (PRILOSEC) 20 MG capsule Take 20 mg by mouth daily.       No Known Allergies Follow-up Information    Follow up with [name], MD In 1 week.   Specialty:  Family Medicine  Why:  need LFts repeated at follow up   Contact information:   [place] [address] [phone]        The results of significant diagnostics from this hospitalization (including imaging, microbiology, ancillary and laboratory) are listed below for reference.    Significant Diagnostic Studies: US Abdomen Complete  09/23/2014   CLINICAL DATA:  Elevated LFTs.  EXAM: ULTRASOUND ABDOMEN COMPLETE  COMPARISON:  Ultrasound 01/10/1999 15, CT 05/02/2010  FINDINGS: Gallbladder: Postcholecystectomy  Common bile duct: Diameter: Normal at 5.5 mm  Liver: Mild increase in parenchymal echogenicity. No biliary duct  dilatation. No focal lesion.  IVC: No abnormality visualized.  Pancreas: Visualized portion unremarkable.  Spleen: Size and appearance within normal limits.  Right Kidney: Length: 11.4 cm . Echogenicity within normal limits. No mass or hydronephrosis visualized.  Left Kidney: Length: 11.2 cm. Echogenicity within normal limits. No mass or hydronephrosis visualized.  Abdominal aorta: No aneurysm visualized.  Other findings: None.  IMPRESSION: 1. Echogenic liver commonly represents hepatic steatosis. Alternatively findings could represent underlying hepatocellular disease such as early cirrhosis. Typically hepatitis has a more heterogeneous pattern. 2. No biliary duct dilatation or focal hepatic lesion. 3. Post cholecystectomy.   Electronically Signed   By: Suzy Bouchard M.D.   On: 09/23/2014 09:49   Dg Abd Acute W/chest  09/21/2014   CLINICAL DATA:  Right chest and upper abdominal pain with diarrhea and vomiting for 2 days.  EXAM: DG ABDOMEN ACUTE W/ 1V CHEST  COMPARISON:  CT abdomen and pelvis 05/02/2010.  FINDINGS: Single view of the chest demonstrates clear lungs and normal heart size. No pneumothorax or pleural effusion.  Two views of the abdomen show no free intraperitoneal air. The bowel gas pattern is unremarkable. Convex right scoliosis is noted. The patient is status post lower lumbar fusion. Multiple surgical clips are seen in the right upper quadrant of the abdomen.  IMPRESSION: No acute abnormality chest or abdomen.   Electronically Signed   By: Inge Rise M.D.   On: 09/21/2014 16:11    Microbiology: Recent Results (from the past 240 hour(s))  MRSA PCR Screening     Status: None   Collection Time: 09/22/14  4:40 AM  Result Value Ref Range Status   MRSA by PCR NEGATIVE NEGATIVE Final    Comment:        The GeneXpert MRSA Assay (FDA approved for NASAL specimens only), is one component of a comprehensive MRSA colonization surveillance program. It is not intended to diagnose  MRSA infection nor to guide or monitor treatment for MRSA infections.   Clostridium Difficile by PCR     Status: None   Collection Time: 09/22/14  3:57 PM  Result Value Ref Range Status   C difficile by pcr NEGATIVE NEGATIVE Final     Labs: Basic Metabolic Panel:  Recent Labs Lab 09/21/14 1520 09/21/14 2310 09/22/14 0411 09/23/14 0328  NA 137 136 138 136  K 3.1* 3.5 3.7 3.6  CL 105 106 108 106  CO2 24 22 28 27   GLUCOSE 111* 119* 96 111*  BUN 17 13 12 17   CREATININE 1.09 1.07 1.02 1.13  CALCIUM 8.8 8.6 8.8 9.1  MG  --  1.7  --   --    Liver Function Tests:  Recent Labs Lab 09/21/14 1520 09/22/14 0411 09/23/14 0328  AST 203* 190* 87*  ALT 126* 207* 161*  ALKPHOS 81 86 84  BILITOT 0.7 1.0 0.8  PROT 6.7 6.1 6.0  ALBUMIN  3.6 3.2* 3.2*    Recent Labs Lab 09/21/14 1520  LIPASE 35   No results for input(s): AMMONIA in the last 168 hours. CBC:  Recent Labs Lab 09/21/14 1520 09/22/14 0411 09/23/14 0328  WBC 10.9* 6.5 7.6  NEUTROABS 7.2 3.1  --   HGB 15.8 14.7 14.3  HCT 46.8 43.6 42.5  MCV 86.7 86.2 86.4  PLT 181 172 192   Cardiac Enzymes:  Recent Labs Lab 09/21/14 1520 09/21/14 2310 09/22/14 0411 09/22/14 0850  TROPONINI <0.03 <0.03 <0.03 <0.03   BNP: BNP (last 3 results) No results for input(s): BNP in the last 8760 hours.  ProBNP (last 3 results) No results for input(s): PROBNP in the last 8760 hours.  CBG: No results for input(s): GLUCAP in the last 168 hours.     SignedDomenic Polite  [facility] 09/23/2014, 11:42 AM

## 2014-09-23 NOTE — Progress Notes (Signed)
Ambulated 400 feet in hallway. Denies dizziness, lightheadedness, and shortness of breath.

## 2014-09-23 NOTE — Progress Notes (Signed)
Patient ID: [name], male   DOB: [birth year], 69 y.o.   MRN: [id number] Subjective:  No complaints abdomen better wore CPAP at night   Objective:  Vital Signs in the last 24 hours: BP 145/65 mmHg  Pulse 54  Temp(Src) 97.5 F (36.4 C) (Oral)  Resp 18  Ht 6\' 2"  (1.88 m)  Wt 300 lb 3.2 oz (136.17 kg)  BMI 38.53 kg/m2  SpO2 97%  Physical Exam: Morbidly obese white male currently pleasant and in no acute distress Lungs:  Clear Cardiac:  Somewhat irregular rhythm, normal S1 and S2, no S3 Abdomen:  Soft, nontender, no masses Extremities:  No edema present  Intake/Output from previous day:    Weight Filed Weights   09/21/14 2016 09/22/14 0500 09/23/14 0628  Weight: 300 lb 6.4 oz (136.261 kg) 305 lb 8.9 oz (138.6 kg) 300 lb 3.2 oz (136.17 kg)    Lab Results: Basic Metabolic Panel:  Recent Labs  09/22/14 0411 09/23/14 0328  NA 138 136  K 3.7 3.6  CL 108 106  CO2 28 27  GLUCOSE 96 111*  BUN 12 17  CREATININE 1.02 1.13   CBC:  Recent Labs  09/21/14 1520 09/22/14 0411 09/23/14 0328  WBC 10.9* 6.5 7.6  NEUTROABS 7.2 3.1  --   HGB 15.8 14.7 14.3  HCT 46.8 43.6 42.5  MCV 86.7 86.2 86.4  PLT 181 172 192   Cardiac Panel (last 3 results)  Recent Labs  09/21/14 2310 09/22/14 0411 09/22/14 0850  TROPONINI <0.03 <0.03 <0.03    Telemetry: Sinus with first-degree AV block and episodes of Mobitz 1 heart block.  Heart rate is currently 54  Assessment/Plan:  1.  Conduction system disease with first-degree and Mobitz 1 heart block which is currently asymptomatic with no previous symptoms of syncope or dizziness 2.  Coronary artery disease with previous infarction and previous PCI without symptoms 3.  Morbid obesity 4.  Sleep apnea that may be contributing to arrhythmias 5.  Hypertension  Recommendations:  See notes by Dr [name] and Allred  Cardiac status stable CAD no chest pain now atypical likely related to GI illness r/o  No indication for pacer  CPAP  and weight loss for OSA Will sign off   Jenkins Rouge

## 2014-09-26 LAB — STOOL CULTURE

## 2014-10-12 DIAGNOSIS — G4733 Obstructive sleep apnea (adult) (pediatric): Secondary | ICD-10-CM | POA: Diagnosis not present

## 2014-10-16 DIAGNOSIS — G4733 Obstructive sleep apnea (adult) (pediatric): Secondary | ICD-10-CM | POA: Diagnosis not present

## 2014-10-23 DIAGNOSIS — E668 Other obesity: Secondary | ICD-10-CM | POA: Diagnosis not present

## 2014-10-23 DIAGNOSIS — G4733 Obstructive sleep apnea (adult) (pediatric): Secondary | ICD-10-CM | POA: Diagnosis not present

## 2014-10-23 DIAGNOSIS — I119 Hypertensive heart disease without heart failure: Secondary | ICD-10-CM | POA: Diagnosis not present

## 2014-10-23 DIAGNOSIS — I2510 Atherosclerotic heart disease of native coronary artery without angina pectoris: Secondary | ICD-10-CM | POA: Diagnosis not present

## 2014-10-23 DIAGNOSIS — I441 Atrioventricular block, second degree: Secondary | ICD-10-CM | POA: Diagnosis not present

## 2014-10-23 DIAGNOSIS — E785 Hyperlipidemia, unspecified: Secondary | ICD-10-CM | POA: Diagnosis not present

## 2014-10-26 DIAGNOSIS — I10 Essential (primary) hypertension: Secondary | ICD-10-CM | POA: Diagnosis not present

## 2014-10-26 DIAGNOSIS — R7989 Other specified abnormal findings of blood chemistry: Secondary | ICD-10-CM | POA: Diagnosis not present

## 2014-11-11 DIAGNOSIS — G4733 Obstructive sleep apnea (adult) (pediatric): Secondary | ICD-10-CM | POA: Diagnosis not present

## 2014-11-16 DIAGNOSIS — G4733 Obstructive sleep apnea (adult) (pediatric): Secondary | ICD-10-CM | POA: Diagnosis not present

## 2014-12-06 DIAGNOSIS — G4733 Obstructive sleep apnea (adult) (pediatric): Secondary | ICD-10-CM | POA: Diagnosis not present

## 2014-12-12 DIAGNOSIS — G4733 Obstructive sleep apnea (adult) (pediatric): Secondary | ICD-10-CM | POA: Diagnosis not present

## 2014-12-16 DIAGNOSIS — G4733 Obstructive sleep apnea (adult) (pediatric): Secondary | ICD-10-CM | POA: Diagnosis not present

## 2015-01-11 DIAGNOSIS — G4733 Obstructive sleep apnea (adult) (pediatric): Secondary | ICD-10-CM | POA: Diagnosis not present

## 2015-01-16 DIAGNOSIS — G4733 Obstructive sleep apnea (adult) (pediatric): Secondary | ICD-10-CM | POA: Diagnosis not present

## 2015-02-07 ENCOUNTER — Encounter (HOSPITAL_COMMUNITY): Payer: Self-pay | Admitting: Emergency Medicine

## 2015-02-07 ENCOUNTER — Encounter: Payer: Self-pay | Admitting: Cardiology

## 2015-02-07 ENCOUNTER — Other Ambulatory Visit: Payer: Self-pay | Admitting: Cardiology

## 2015-02-07 ENCOUNTER — Emergency Department (HOSPITAL_COMMUNITY): Payer: Commercial Managed Care - HMO

## 2015-02-07 ENCOUNTER — Inpatient Hospital Stay (HOSPITAL_COMMUNITY)
Admission: EM | Admit: 2015-02-07 | Discharge: 2015-02-09 | DRG: 249 | Disposition: A | Discharge status: Home / Self Care | Payer: Commercial Managed Care - HMO | Attending: Cardiology | Admitting: Cardiology

## 2015-02-07 DIAGNOSIS — C61 Malignant neoplasm of prostate: Secondary | ICD-10-CM | POA: Diagnosis not present

## 2015-02-07 DIAGNOSIS — H5442 Blindness, left eye, normal vision right eye: Secondary | ICD-10-CM | POA: Diagnosis present

## 2015-02-07 DIAGNOSIS — I119 Hypertensive heart disease without heart failure: Secondary | ICD-10-CM | POA: Diagnosis present

## 2015-02-07 DIAGNOSIS — I25110 Atherosclerotic heart disease of native coronary artery with unstable angina pectoris: Secondary | ICD-10-CM | POA: Diagnosis not present

## 2015-02-07 DIAGNOSIS — I200 Unstable angina: Secondary | ICD-10-CM

## 2015-02-07 DIAGNOSIS — M109 Gout, unspecified: Secondary | ICD-10-CM | POA: Diagnosis not present

## 2015-02-07 DIAGNOSIS — Z8546 Personal history of malignant neoplasm of prostate: Secondary | ICD-10-CM

## 2015-02-07 DIAGNOSIS — Z6838 Body mass index (BMI) 38.0-38.9, adult: Secondary | ICD-10-CM

## 2015-02-07 DIAGNOSIS — R001 Bradycardia, unspecified: Secondary | ICD-10-CM | POA: Diagnosis not present

## 2015-02-07 DIAGNOSIS — G4733 Obstructive sleep apnea (adult) (pediatric): Secondary | ICD-10-CM | POA: Diagnosis present

## 2015-02-07 DIAGNOSIS — M519 Unspecified thoracic, thoracolumbar and lumbosacral intervertebral disc disorder: Secondary | ICD-10-CM | POA: Insufficient documentation

## 2015-02-07 DIAGNOSIS — I252 Old myocardial infarction: Secondary | ICD-10-CM | POA: Diagnosis not present

## 2015-02-07 DIAGNOSIS — R079 Chest pain, unspecified: Secondary | ICD-10-CM | POA: Diagnosis not present

## 2015-02-07 DIAGNOSIS — E785 Hyperlipidemia, unspecified: Secondary | ICD-10-CM | POA: Diagnosis not present

## 2015-02-07 DIAGNOSIS — Z8711 Personal history of peptic ulcer disease: Secondary | ICD-10-CM

## 2015-02-07 DIAGNOSIS — Z955 Presence of coronary angioplasty implant and graft: Secondary | ICD-10-CM

## 2015-02-07 DIAGNOSIS — Z1389 Encounter for screening for other disorder: Secondary | ICD-10-CM | POA: Diagnosis not present

## 2015-02-07 DIAGNOSIS — I10 Essential (primary) hypertension: Secondary | ICD-10-CM | POA: Diagnosis not present

## 2015-02-07 DIAGNOSIS — Z0000 Encounter for general adult medical examination without abnormal findings: Secondary | ICD-10-CM | POA: Diagnosis not present

## 2015-02-07 DIAGNOSIS — K219 Gastro-esophageal reflux disease without esophagitis: Secondary | ICD-10-CM | POA: Diagnosis not present

## 2015-02-07 DIAGNOSIS — I441 Atrioventricular block, second degree: Secondary | ICD-10-CM | POA: Diagnosis present

## 2015-02-07 DIAGNOSIS — Z87891 Personal history of nicotine dependence: Secondary | ICD-10-CM | POA: Diagnosis not present

## 2015-02-07 DIAGNOSIS — E780 Pure hypercholesterolemia: Secondary | ICD-10-CM | POA: Diagnosis not present

## 2015-02-07 DIAGNOSIS — J449 Chronic obstructive pulmonary disease, unspecified: Secondary | ICD-10-CM | POA: Diagnosis not present

## 2015-02-07 DIAGNOSIS — Z9861 Coronary angioplasty status: Secondary | ICD-10-CM

## 2015-02-07 DIAGNOSIS — I2510 Atherosclerotic heart disease of native coronary artery without angina pectoris: Secondary | ICD-10-CM

## 2015-02-07 HISTORY — DX: Other complications of anesthesia, initial encounter: T88.59XA

## 2015-02-07 HISTORY — DX: Unspecified osteoarthritis, unspecified site: M19.90

## 2015-02-07 HISTORY — DX: Personal history of peptic ulcer disease: Z87.11

## 2015-02-07 HISTORY — DX: Adverse effect of unspecified anesthetic, initial encounter: T41.45XA

## 2015-02-07 HISTORY — DX: Personal history of other diseases of the musculoskeletal system and connective tissue: Z87.39

## 2015-02-07 HISTORY — DX: Blindness, one eye, unspecified eye: H54.40

## 2015-02-07 HISTORY — DX: Calculus of kidney: N20.0

## 2015-02-07 HISTORY — DX: Acute myocardial infarction, unspecified: I21.9

## 2015-02-07 HISTORY — DX: Atherosclerotic heart disease of native coronary artery without angina pectoris: I25.10

## 2015-02-07 LAB — CBC WITH DIFFERENTIAL/PLATELET
BASOS ABS: 0 10*3/uL (ref 0.0–0.1)
BASOS PCT: 0 % (ref 0–1)
BASOS PCT: 0 % (ref 0–1)
Basophils Absolute: 0 10*3/uL (ref 0.0–0.1)
EOS ABS: 0.3 10*3/uL (ref 0.0–0.7)
EOS PCT: 3 % (ref 0–5)
EOS PCT: 2 % (ref 0–5)
Eosinophils Absolute: 0.3 10*3/uL (ref 0.0–0.7)
HCT: 46.2 % (ref 39.0–52.0)
HCT: 46.3 % (ref 39.0–52.0)
HEMOGLOBIN: 16.1 g/dL (ref 13.0–17.0)
Hemoglobin: 16.2 g/dL (ref 13.0–17.0)
LYMPHS ABS: 3.3 10*3/uL (ref 0.7–4.0)
Lymphocytes Relative: 35 % (ref 12–46)
Lymphocytes Relative: 41 % (ref 12–46)
Lymphs Abs: 4.4 10*3/uL — ABNORMAL HIGH (ref 0.7–4.0)
MCH: 30.3 pg (ref 26.0–34.0)
MCH: 30.3 pg (ref 26.0–34.0)
MCHC: 34.8 g/dL (ref 30.0–36.0)
MCHC: 35 g/dL (ref 30.0–36.0)
MCV: 86.8 fL (ref 78.0–100.0)
MCV: 86.7 fL (ref 78.0–100.0)
MONO ABS: 0.8 10*3/uL (ref 0.1–1.0)
MONOS PCT: 8 % (ref 3–12)
MONOS PCT: 7 % (ref 3–12)
Monocytes Absolute: 0.7 10*3/uL (ref 0.1–1.0)
Neutro Abs: 5.2 10*3/uL (ref 1.7–7.7)
Neutro Abs: 5.5 10*3/uL (ref 1.7–7.7)
Neutrophils Relative %: 54 % (ref 43–77)
Neutrophils Relative %: 50 % (ref 43–77)
PLATELETS: 186 10*3/uL (ref 150–400)
Platelets: 182 10*3/uL (ref 150–400)
RBC: 5.32 MIL/uL (ref 4.22–5.81)
RBC: 5.34 MIL/uL (ref 4.22–5.81)
RDW: 14.1 % (ref 11.5–15.5)
RDW: 14.1 % (ref 11.5–15.5)
WBC: 9.6 10*3/uL (ref 4.0–10.5)
WBC: 10.9 10*3/uL — ABNORMAL HIGH (ref 4.0–10.5)

## 2015-02-07 LAB — COMPREHENSIVE METABOLIC PANEL
ALBUMIN: 3.8 g/dL (ref 3.5–5.0)
ALK PHOS: 59 U/L (ref 38–126)
ALK PHOS: 60 U/L (ref 38–126)
ALT: 23 U/L (ref 17–63)
ALT: 25 U/L (ref 17–63)
ANION GAP: 6 (ref 5–15)
AST: 25 U/L (ref 15–41)
AST: 31 U/L (ref 15–41)
Albumin: 3.9 g/dL (ref 3.5–5.0)
Anion gap: 11 (ref 5–15)
BILIRUBIN TOTAL: 1.1 mg/dL (ref 0.3–1.2)
BUN: 21 mg/dL — ABNORMAL HIGH (ref 6–20)
BUN: 19 mg/dL (ref 6–20)
CALCIUM: 10.4 mg/dL — AB (ref 8.9–10.3)
CALCIUM: 10.1 mg/dL (ref 8.9–10.3)
CO2: 28 mmol/L (ref 22–32)
CO2: 25 mmol/L (ref 22–32)
CREATININE: 1.26 mg/dL — AB (ref 0.61–1.24)
Chloride: 103 mmol/L (ref 101–111)
Chloride: 101 mmol/L (ref 101–111)
Creatinine, Ser: 1.21 mg/dL (ref 0.61–1.24)
GFR calc non Af Amer: 59 mL/min — ABNORMAL LOW (ref >60)
GFR, EST NON AFRICAN AMERICAN: 56 mL/min — AB (ref >60)
GLUCOSE: 95 mg/dL (ref 65–99)
Glucose, Bld: 116 mg/dL — ABNORMAL HIGH (ref 65–99)
POTASSIUM: 4 mmol/L (ref 3.5–5.1)
Potassium: 3.9 mmol/L (ref 3.5–5.1)
SODIUM: 137 mmol/L (ref 135–145)
Sodium: 137 mmol/L (ref 135–145)
TOTAL PROTEIN: 7.1 g/dL (ref 6.5–8.1)
TOTAL PROTEIN: 7.7 g/dL (ref 6.5–8.1)
Total Bilirubin: 0.8 mg/dL (ref 0.3–1.2)

## 2015-02-07 LAB — TSH: TSH: 1.52 u[IU]/mL (ref 0.350–4.500)

## 2015-02-07 LAB — TROPONIN I

## 2015-02-07 LAB — PROTIME-INR
INR: 1.1 (ref 0.00–1.49)
Prothrombin Time: 14.4 seconds (ref 11.6–15.2)

## 2015-02-07 MED ORDER — NITROGLYCERIN 0.4 MG SL SUBL: 0.4000 mg | SUBLINGUAL_TABLET | SUBLINGUAL | Status: DC | PRN

## 2015-02-07 MED ORDER — SODIUM CHLORIDE 0.9 % IJ SOLN
3.0000 mL | Freq: Two times a day (BID) | INTRAMUSCULAR | Status: DC
  Administered 2015-02-07: 3 mL via INTRAVENOUS

## 2015-02-07 MED ORDER — ATORVASTATIN CALCIUM 80 MG PO TABS
80.0000 mg | ORAL_TABLET | Freq: Every day | ORAL | Status: DC
  Administered 2015-02-08: 80 mg via ORAL
  Filled 2015-02-07 (×2): qty 1

## 2015-02-07 MED ORDER — SODIUM CHLORIDE 0.9 % IV SOLN: 250.0000 mL | INTRAVENOUS | Status: DC | PRN

## 2015-02-07 MED ORDER — ASPIRIN 81 MG PO CHEW: 324.0000 mg | CHEWABLE_TABLET | ORAL | Status: DC

## 2015-02-07 MED ORDER — HEPARIN (PORCINE) IN NACL 100-0.45 UNIT/ML-% IJ SOLN
1500.0000 [IU]/h | INTRAMUSCULAR | Status: DC
  Administered 2015-02-07 – 2015-02-08 (×2): 1500 [IU]/h via INTRAVENOUS
  Filled 2015-02-07 (×2): qty 250

## 2015-02-07 MED ORDER — SODIUM CHLORIDE 0.9 % IJ SOLN: 3.0000 mL | INTRAMUSCULAR | Status: DC | PRN

## 2015-02-07 MED ORDER — SODIUM CHLORIDE 0.9 % WEIGHT BASED INFUSION
3.0000 mL/kg/h | INTRAVENOUS | Status: DC
  Administered 2015-02-08: 3 mL/kg/h via INTRAVENOUS

## 2015-02-07 MED ORDER — ASPIRIN EC 81 MG PO TBEC: 81.0000 mg | DELAYED_RELEASE_TABLET | Freq: Every day | ORAL | Status: DC

## 2015-02-07 MED ORDER — SODIUM CHLORIDE 0.9 % WEIGHT BASED INFUSION: 1.0000 mL/kg/h | INTRAVENOUS | Status: DC

## 2015-02-07 MED ORDER — HEPARIN BOLUS VIA INFUSION
4000.0000 [IU] | Freq: Once | INTRAVENOUS | Status: AC
  Administered 2015-02-07: 4000 [IU] via INTRAVENOUS
  Filled 2015-02-07: qty 4000

## 2015-02-07 MED ORDER — ONDANSETRON HCL 4 MG/2ML IJ SOLN
4.0000 mg | Freq: Four times a day (QID) | INTRAMUSCULAR | Status: DC | PRN
  Administered 2015-02-08: 17:00:00 4 mg via INTRAVENOUS
  Filled 2015-02-07: qty 2

## 2015-02-07 MED ORDER — ACETAMINOPHEN 325 MG PO TABS
650.0000 mg | ORAL_TABLET | ORAL | Status: DC | PRN
  Administered 2015-02-08 – 2015-02-09 (×2): 650 mg via ORAL
  Filled 2015-02-07 (×3): qty 2

## 2015-02-07 MED ORDER — ASPIRIN 81 MG PO CHEW
81.0000 mg | CHEWABLE_TABLET | ORAL | Status: AC
  Administered 2015-02-08: 81 mg via ORAL
  Filled 2015-02-07: qty 1

## 2015-02-07 MED ORDER — ASPIRIN 300 MG RE SUPP: 300.0000 mg | RECTAL | Status: DC

## 2015-02-07 NOTE — ED Provider Notes (Signed)
CSN: [id number]     Arrival date & time 02/07/15  1559 History   None    Chief Complaint  Patient presents with  . Chest Pain     (Consider location/radiation/quality/duration/timing/severity/associated sxs/prior Treatment) Patient is a 69 y.o. male presenting with chest pain. The history is provided by the patient.  Chest Pain He has been having a burning pain in his chest the last week. It comes on when he walks too fast and resolves when he stops and chews a couple of Tums. Pain does radiate up into his neck and left side of his jaw. There is associated dyspnea but no nausea or diaphoresis. He has been awakened with similar pain. He does have history of myocardial infarction and stent placement. He saw his physician today for a routine visit and mentioned the chest discomfort and was sent to the ED. ECG also showed marked bradycardia.  Past Medical History  Diagnosis Date  . Hypertension    History reviewed. No pertinent past surgical history. History reviewed. No pertinent family history. Social History  Substance Use Topics  . Smoking status: Former Research scientist (life sciences)  . Smokeless tobacco: None  . Alcohol Use: No    Review of Systems  Cardiovascular: Positive for chest pain.  All other systems reviewed and are negative.     Allergies  Review of patient's allergies indicates not on file.  Home Medications   Prior to Admission medications   Not on File   BP 139/87 mmHg  Pulse 37  Resp 16  SpO2 100% Physical Exam  Nursing note and vitals reviewed.  69 year old male, resting comfortably and in no acute distress. Vital signs are significant for bradycardia. Oxygen saturation is 100%, which is normal. Head is normocephalic and atraumatic. PERRLA, EOMI. Oropharynx is clear. Neck is nontender and supple without adenopathy or JVD. Back is nontender and there is no CVA tenderness. Lungs are clear without rales, wheezes, or rhonchi. Chest is nontender. Heart is bradycardic  without murmur. Abdomen is soft, flat, nontender without masses or hepatosplenomegaly and peristalsis is normoactive. Extremities have 1+ edema with prominent venous stasis changes bilaterally. Bullous present on the right lower leg anteriorly. Skin is warm and dry without rash. Neurologic: Mental status is normal, cranial nerves are intact, there are no motor or sensory deficits.  ED Course  Procedures (including critical care time) Labs Review Results for orders placed or performed during the hospital encounter of 02/07/15  CBC with Differential  Result Value Ref Range   WBC 9.6 4.0 - 10.5 K/uL   RBC 5.32 4.22 - 5.81 MIL/uL   Hemoglobin 16.1 13.0 - 17.0 g/dL   HCT 46.2 39.0 - 52.0 %   MCV 86.8 78.0 - 100.0 fL   MCH 30.3 26.0 - 34.0 pg   MCHC 34.8 30.0 - 36.0 g/dL   RDW 14.1 11.5 - 15.5 %   Platelets 182 150 - 400 K/uL   Neutrophils Relative % 54 43 - 77 %   Neutro Abs 5.2 1.7 - 7.7 K/uL   Lymphocytes Relative 35 12 - 46 %   Lymphs Abs 3.3 0.7 - 4.0 K/uL   Monocytes Relative 8 3 - 12 %   Monocytes Absolute 0.8 0.1 - 1.0 K/uL   Eosinophils Relative 3 0 - 5 %   Eosinophils Absolute 0.3 0.0 - 0.7 K/uL   Basophils Relative 0 0 - 1 %   Basophils Absolute 0.0 0.0 - 0.1 K/uL  Comprehensive metabolic panel  Result Value Ref Range  Sodium 137 135 - 145 mmol/L   Potassium 4.0 3.5 - 5.1 mmol/L   Chloride 103 101 - 111 mmol/L   CO2 28 22 - 32 mmol/L   Glucose, Bld 95 65 - 99 mg/dL   BUN 21 (H) 6 - 20 mg/dL   Creatinine, Ser 1.21 0.61 - 1.24 mg/dL   Calcium 10.4 (H) 8.9 - 10.3 mg/dL   Total Protein 7.1 6.5 - 8.1 g/dL   Albumin 3.8 3.5 - 5.0 g/dL   AST 25 15 - 41 U/L   ALT 23 17 - 63 U/L   Alkaline Phosphatase 59 38 - 126 U/L   Total Bilirubin 0.8 0.3 - 1.2 mg/dL   GFR calc non Af Amer 59 (L) >60 mL/min   GFR calc Af Amer >60 >60 mL/min   Anion gap 6 5 - 15  Protime-INR  Result Value Ref Range   Prothrombin Time 14.4 11.6 - 15.2 seconds   INR 1.10 0.00 - 1.49  Troponin I   Result Value Ref Range   Troponin I <0.03 <0.031 ng/mL   Imaging Review Dg Chest Port 1 View  02/07/2015   CLINICAL DATA:  Burning in chest, hypertension, chest pain  EXAM: PORTABLE CHEST - 1 VIEW  COMPARISON:  Portable exam 1730 hours without priors for comparison.  FINDINGS: Enlargement of cardiac silhouette.  Mediastinal contours normal.  Slight pulmonary vascular congestion.  External pacing leads project over LEFT chest.  No gross infiltrate, pleural effusion or pneumothorax.  IMPRESSION: Enlargement of cardiac silhouette with minimal pulmonary vascular congestion.  Remainder of exam unremarkable.   Electronically Signed   By: Lavonia Dana M.D.   On: 02/07/2015 17:31   I have personally reviewed and evaluated these images and lab results as part of my medical decision-making.  ECG Interpretation: Rate: 41 Rhythm: 2:1 block with Mobitz 1 block ST-T waves: Non-specific T wave flattening Old anteroseptal MI Compared with ECG of 09/24/2014, no significant changes are seen  CRITICAL CARE Performed by: [id number] Total critical care time: 50 minutes Critical care time was exclusive of separately billable procedures and treating other patients. Critical care was necessary to treat or prevent imminent or life-threatening deterioration. Critical care was time spent personally by me on the following activities: development of treatment plan with patient and/or surrogate as well as nursing, discussions with consultants, evaluation of patient's response to treatment, examination of patient, obtaining history from patient or surrogate, ordering and performing treatments and interventions, ordering and review of laboratory studies, ordering and review of radiographic studies, pulse oximetry and re-evaluation of patient's condition.  MDM   Final diagnoses:  Unstable angina  AV block, 2nd degree    Chest pain in pattern consistent with unstable angina. He is started on heparin. Bradycardia which  appears to be predominantly to the 1 block with areas that appeared to be Mobitz 1 block. He is hemodynamically stable with this, so he is not given any chronotropic agents. Review of his medications shows that he is not on any agents which should cause bradycardia. Review of old records shows that he had been admitted to the hospital in April and had similar bradycardia and had been evaluated by electrophysiologist who did not feel he needed a pacemaker. I do feel that this will need to be looked at again. Apparently, his bradycardia was felt to be due to the acute condition that he had at that time. Case is discussed with Dr. [name], patient's cardiologist, who agrees to admit the patient.  Delora Fuel, MD [id number]

## 2015-02-07 NOTE — Progress Notes (Addendum)
ANTICOAGULATION CONSULT NOTE - Initial Consult  Pharmacy Consult for Heparin Indication: chest pain/ACS  Not on File  Patient Measurements:    Vital Signs: Temp: 98.3 F (36.8 C) (08/24 1706) BP: 138/71 mmHg (08/24 1737) Pulse Rate: 37 (08/24 1706)  Labs:  Recent Labs  02/07/15 1653  HGB 16.1  HCT 46.2  PLT 182  LABPROT 14.4  INR 1.10  CREATININE 1.21  TROPONINI <0.03    CrCl cannot be calculated (Unknown ideal weight.).   Medical History: Past Medical History  Diagnosis Date  . Hypertension     Medications:  Scheduled:   Assessment: 24 yoM with chest pain intermittent x1 week. Presenting to ED via EMS from PCP. Pharmacy consulted to dose heparin for ACS.  Goal of Therapy:  Heparin level 0.3-0.7 units/ml Monitor platelets by anticoagulation protocol: Yes   Plan:  Give 4000 units bolus x 1 Start heparin infusion at 1500 units/hr Check anti-Xa level in 8 hours and daily while on heparin Continue to monitor H&H and platelets   Darl Pikes, PharmD Clinical Pharmacist- Resident Pager: [phone]   Darl Pikes 02/07/2015,6:22 PM

## 2015-02-07 NOTE — ED Notes (Signed)
Per ems- pt to ED from PCP due to left chest pain that is intermittent x1 week. Radiates into neck. Hx of HTN and 2 MI's one in 1995 & 2010. Pt HR on arrival in 30's. Denies chest pain at this time. Pt placed on zoll and MD Roxanne Mins at bedside.  VSS besides HR stable, pt asymptomatic with HR 38 . Pt a/o x4.

## 2015-02-07 NOTE — H&P (Signed)
History and Physical   Admit date: 02/07/2015 Name:  [name] Medical record number: [id number] DOB/Age:  January 15, 1946  69 y.o. male  Referring Physician:   [facility]  Primary Cardiologist:  [name]   Primary Physician:  Dr. Maury Dus  Chief complaint/reason for admission: Progressive angina   HPI:  This 69 year old male has a history of coronary artery disease.  He had a non-STEMI in 1998 and had a MultiLink stent placed in the circumflex during that admission.  He was admitted in 2007 again with a non-STEMI and had 2 non-drug-eluting stents placed in the LAD and circumflex.  He has done well since that time without significant angina.  He was hospitalized in April of this year with a GI illness and was noted to have second degree AV block.  He was seen by electrophysiology at that time and was observed.  He was seen back in the office in June at which time he was asymptomatic.  About a week ago he began to experience exertional substernal burning type pain with radiation up the left side of his neck that would be relieved with rest and also after taking 2 times.  He had absolutely no symptoms at rest.  He went today for a regular 6 month appointment with his primary doctor in the above history was noted and he was significantly bradycardic and his primary doctor's office and was sent to the emergency room.  Initial troponin was negative in light of progressive symptoms he is admitted for further evaluation.  His last episode of discomfort was this morning.  The patient does have a history of sleep apnea and wears sleep apnea.  He denies PND, orthopnea or edema.   Past Medical History  Diagnosis Date  . Obesity   . Hyperlipidemia   . OSA (obstructive sleep apnea)   . GERD (gastroesophageal reflux disease)   . CAD (coronary artery disease)   . COPD (chronic obstructive pulmonary disease)   . Pneumonia   . Pulmonary nodule   . Prostate cancer   . Second degree Mobitz  I AV block   . Hypertensive heart disease without CHF   . Lumbar disc disease   . History of peptic ulcer disease   . Gout      Past Surgical History  Procedure Laterality Date  . Cholecystectomy    . Lumbar laminectomy    . Appendectomy    . Allergies: has No Known Allergies.   Medications: Prior to Admission medications   Medication Sig Start Date End Date Taking? Authorizing Provider  acetaminophen (TYLENOL) 500 MG tablet Take 500 mg by mouth every 6 (six) hours as needed for moderate pain.    Historical Provider, MD  allopurinol (ZYLOPRIM) 300 MG tablet Take 300 mg by mouth Daily.  11/07/11   Historical Provider, MD  amLODipine (NORVASC) 10 MG tablet Take 10 mg by mouth Daily.  11/07/11   Historical Provider, MD  atorvastatin (LIPITOR) 20 MG tablet Take 0.5 tablets (10 mg total) by mouth daily. 09/23/14   Domenic Polite, MD  hydrochlorothiazide (HYDRODIURIL) 25 MG tablet Take 25 mg by mouth daily.    Historical Provider, MD  losartan (COZAAR) 100 MG tablet Take 100 mg by mouth daily.    Historical Provider, MD  omeprazole (PRILOSEC) 20 MG capsule Take 20 mg by mouth daily.    Historical Provider, MD    Family History:  Family Status  Relation Status Death Age  . Mother Deceased  died of DVT  . Father Deceased     CVA  . Brother Deceased 33    died of MI   . Brother    . Brother    . Brother    . Sister      Social History:   reports that he quit smoking about 31 years ago. His smoking use included Cigarettes. He has a 102 pack-year smoking history. He does not have any smokeless tobacco history on file. He reports that he does not drink alcohol or use illicit drugs.   Social History   Social History Narrative     Review of Systems:  He has been obese for many years.  He has significant sleep apnea.  He has also a history of a traumatic injury to his right lower leg that has resulted in a raised prominence present for many years.  He has trace edema.  Moderate  nocturia.  He has a history of low back pain with previous laminectomy.  Other than as noted above the remainder of the review of systems is unremarkable.  Physical Exam: Blood pressure 120/70, pulse 42, respirations 16, weight 370  General appearance: Very large obese male currently in no acute distress Head: Normocephalic, without obvious abnormality, atraumatic, Balding male hair pattern Eyes: conjunctivae/corneas clear. PERRL, EOM's intact. Fundi not examined  Neck: no adenopathy, no carotid bruit, no JVD and supple, symmetrical, trachea midline Lungs: clear to auscultation bilaterally Heart: Irregular rate and rhythm, somewhat slow, normal S1 and S2, no S3 or murmur Abdomen: soft, non-tender; bowel sounds normal; no masses,  no organomegaly Extremities: Venous prominence present on anterior aspect of lower right leg, trace edema, no deformity Pulses: 2+ and symmetric Skin: Skin color, texture, turgor normal. No rashes or lesions Neurologic: Grossly normal  Labs: CBC  EKG: Second-degree AV block type I, nonspecific ST changes  Radiology: Pending at the time of dictation   IMPRESSIONS: 1.  Chest pain consistent with progressive and recent onset angina 2.  Coronary artery disease with previous LAD and circumflex stent in 2007 3.  Type II AV block-asymptomatic 4.  Hypertension  5.  Hyperlipidemia under treatment 6.  Severe obesity  PLAN: Admit to telemetry.  Intravenous heparin.  He does have  asymptomatic AV block but in light of progressive symptoms thinks he needs to have repeat catheterization.  Cardiac catheterization was discussed with the patient fully including risks of myocardial infarction, death, stroke, bleeding, arrhythmia, dye allergy, renal insufficiency or bleeding.  The patient understands and is willing to proceed.  Possibility of stenting discussed with patient and wife and they're agreeable.   Signed: Kerry Hough MD [facility]  02/07/2015,  5:34 PM

## 2015-02-07 NOTE — H&P (Signed)
History and Physical   Admit date: 02/07/2015 Name:  [name] Medical record number: [id number] DOB/Age:  1945/08/15  69 y.o. male  Referring Physician:   [facility]  Primary Cardiologist:  [name]   Primary Physician:  Dr. Maury Dus  Chief complaint/reason for admission: Progressive angina   HPI:  This 69 year old male has a history of coronary artery disease.  He had a non-STEMI in 1998 and had a MultiLink stent placed in the circumflex during that admission.  He was admitted in 2007 again with a non-STEMI and had 2 non-drug-eluting stents placed in the LAD and circumflex.  He has done well since that time without significant angina.  He was hospitalized in April of this year with a GI illness and was noted to have second degree AV block.  He was seen by electrophysiology at that time and was observed.  He was seen back in the office in June at which time he was asymptomatic.  About a week ago he began to experience exertional substernal burning type pain with radiation up the left side of his neck that would be relieved with rest and also after taking 2 times.  He had absolutely no symptoms at rest.  He went today for a regular 6 month appointment with his primary doctor in the above history was noted and he was significantly bradycardic and his primary doctor's office and was sent to the emergency room.  Initial troponin was negative in light of progressive symptoms he is admitted for further evaluation.  His last episode of discomfort was this morning.  The patient does have a history of sleep apnea and wears sleep apnea.  He denies PND, orthopnea or edema.   Past Medical History  Diagnosis Date  . Obesity   . Hyperlipidemia   . OSA (obstructive sleep apnea)   . GERD (gastroesophageal reflux disease)   . CAD (coronary artery disease)   . COPD (chronic obstructive pulmonary disease)   . Pneumonia   . Pulmonary nodule   . Prostate cancer   . Second degree  Mobitz I AV block   . Hypertensive heart disease without CHF   . Lumbar disc disease   . History of peptic ulcer disease   . Gout      Past Surgical History  Procedure Laterality Date  . Cholecystectomy    . Lumbar laminectomy    . Appendectomy    . Allergies: has No Known Allergies.   Medications: Prior to Admission medications   Medication Sig Start Date End Date Taking? Authorizing Provider  acetaminophen (TYLENOL) 500 MG tablet Take 500 mg by mouth every 6 (six) hours as needed for moderate pain.    Historical Provider, MD  allopurinol (ZYLOPRIM) 300 MG tablet Take 300 mg by mouth Daily.  11/07/11   Historical Provider, MD  amLODipine (NORVASC) 10 MG tablet Take 10 mg by mouth Daily.  11/07/11   Historical Provider, MD  atorvastatin (LIPITOR) 20 MG tablet Take 0.5 tablets (10 mg total) by mouth daily. 09/23/14   Domenic Polite, MD  hydrochlorothiazide (HYDRODIURIL) 25 MG tablet Take 25 mg by mouth daily.    Historical Provider, MD  losartan (COZAAR) 100 MG tablet Take 100 mg by mouth daily.    Historical Provider, MD  omeprazole (PRILOSEC) 20 MG capsule Take 20 mg by mouth daily.    Historical Provider, MD    Family History:  Family Status  Relation Status Death Age  . Mother Deceased  died of DVT  . Father Deceased     CVA  . Brother Deceased 12    died of MI   . Brother    . Brother    . Brother    . Sister      Social History:   reports that he quit smoking about 31 years ago. His smoking use included Cigarettes. He has a 102 pack-year smoking history. He does not have any smokeless tobacco history on file. He reports that he does not drink alcohol or use illicit drugs.   Social History   Social History Narrative     Review of Systems:  He has been obese for many years.  He has significant sleep apnea.  He has also a history of a traumatic injury to his right lower leg that has resulted in a raised prominence present for many years.  He has trace edema.   Moderate nocturia.  He has a history of low back pain with previous laminectomy.  Other than as noted above the remainder of the review of systems is unremarkable.  Physical Exam: Blood pressure 120/70, pulse 42, respirations 16, weight 370  General appearance: Very large obese male currently in no acute distress Head: Normocephalic, without obvious abnormality, atraumatic, Balding male hair pattern Eyes: conjunctivae/corneas clear. PERRL, EOM's intact. Fundi not examined  Neck: no adenopathy, no carotid bruit, no JVD and supple, symmetrical, trachea midline Lungs: clear to auscultation bilaterally Heart: Irregular rate and rhythm, somewhat slow, normal S1 and S2, no S3 or murmur Abdomen: soft, non-tender; bowel sounds normal; no masses,  no organomegaly Extremities: Venous prominence present on anterior aspect of lower right leg, trace edema, no deformity Pulses: 2+ and symmetric Skin: Skin color, texture, turgor normal. No rashes or lesions Neurologic: Grossly normal  Labs: CBC  EKG: Second-degree AV block type I, nonspecific ST changes  Radiology: Pending at the time of dictation   IMPRESSIONS: 1.  Chest pain consistent with progressive and recent onset angina 2.  Coronary artery disease with previous LAD and circumflex stent in 2007 3.  Type II AV block-asymptomatic 4.  Hypertension  5.  Hyperlipidemia under treatment 6.  Severe obesity  PLAN: Admit to telemetry.  Intravenous heparin.  He does have  asymptomatic AV block but in light of progressive symptoms thinks he needs to have repeat catheterization.  Cardiac catheterization was discussed with the patient fully including risks of myocardial infarction, death, stroke, bleeding, arrhythmia, dye allergy, renal insufficiency or bleeding.  The patient understands and is willing to proceed.  Possibility of stenting discussed with patient and wife and they're agreeable.   Signed: Kerry Hough MD  [facility]  02/07/2015, 5:34 PM

## 2015-02-08 ENCOUNTER — Encounter (HOSPITAL_COMMUNITY)
Admission: EM | Disposition: A | Discharge status: Home / Self Care | Payer: Commercial Managed Care - HMO | Source: Home / Self Care | Attending: Cardiology

## 2015-02-08 DIAGNOSIS — I25110 Atherosclerotic heart disease of native coronary artery with unstable angina pectoris: Secondary | ICD-10-CM

## 2015-02-08 HISTORY — PX: CARDIAC CATHETERIZATION: SHX172

## 2015-02-08 LAB — CBC
HCT: 43.3 % (ref 39.0–52.0)
HEMOGLOBIN: 14.9 g/dL (ref 13.0–17.0)
MCH: 30 pg (ref 26.0–34.0)
MCHC: 34.4 g/dL (ref 30.0–36.0)
MCV: 87.3 fL (ref 78.0–100.0)
Platelets: 171 10*3/uL (ref 150–400)
RBC: 4.96 MIL/uL (ref 4.22–5.81)
RDW: 14.2 % (ref 11.5–15.5)
WBC: 9 10*3/uL (ref 4.0–10.5)

## 2015-02-08 LAB — HEPARIN LEVEL (UNFRACTIONATED): HEPARIN UNFRACTIONATED: 0.42 [IU]/mL (ref 0.30–0.70)

## 2015-02-08 LAB — LIPID PANEL
CHOLESTEROL: 123 mg/dL (ref 0–200)
HDL: 28 mg/dL — AB (ref >40)
LDL Cholesterol: 59 mg/dL (ref 0–99)
Total CHOL/HDL Ratio: 4.4 RATIO
Triglycerides: 178 mg/dL — ABNORMAL HIGH (ref <150)
VLDL: 36 mg/dL (ref 0–40)

## 2015-02-08 LAB — POCT ACTIVATED CLOTTING TIME: ACTIVATED CLOTTING TIME: 558 s

## 2015-02-08 LAB — TROPONIN I

## 2015-02-08 SURGERY — LEFT HEART CATH AND CORONARY ANGIOGRAPHY
Anesthesia: LOCAL

## 2015-02-08 MED ORDER — BIVALIRUDIN 250 MG IV SOLR
INTRAVENOUS | Status: AC
  Filled 2015-02-08: qty 250

## 2015-02-08 MED ORDER — ASPIRIN 81 MG PO CHEW: 81.0000 mg | CHEWABLE_TABLET | ORAL | Status: DC

## 2015-02-08 MED ORDER — FENTANYL CITRATE (PF) 100 MCG/2ML IJ SOLN
INTRAMUSCULAR | Status: AC
  Filled 2015-02-08: qty 4

## 2015-02-08 MED ORDER — ASPIRIN EC 81 MG PO TBEC
81.0000 mg | DELAYED_RELEASE_TABLET | Freq: Every day | ORAL | Status: DC
  Filled 2015-02-08: qty 1

## 2015-02-08 MED ORDER — SODIUM CHLORIDE 0.9 % IV SOLN
250.0000 mg | INTRAVENOUS | Status: DC | PRN
  Administered 2015-02-08: 1.75 mg/kg/h via INTRAVENOUS

## 2015-02-08 MED ORDER — MIDAZOLAM HCL 2 MG/2ML IJ SOLN
INTRAMUSCULAR | Status: DC | PRN
  Administered 2015-02-08: 1 mg via INTRAVENOUS

## 2015-02-08 MED ORDER — VERAPAMIL HCL 2.5 MG/ML IV SOLN
INTRAVENOUS | Status: DC | PRN
  Administered 2015-02-08: 13:00:00 via INTRA_ARTERIAL

## 2015-02-08 MED ORDER — TICAGRELOR 90 MG PO TABS
ORAL_TABLET | ORAL | Status: AC
  Filled 2015-02-08: qty 2

## 2015-02-08 MED ORDER — HEPARIN SODIUM (PORCINE) 1000 UNIT/ML IJ SOLN
INTRAMUSCULAR | Status: AC
  Filled 2015-02-08: qty 1

## 2015-02-08 MED ORDER — HYDRALAZINE HCL 20 MG/ML IJ SOLN
20.0000 mg | INTRAMUSCULAR | Status: DC | PRN
  Filled 2015-02-08 (×2): qty 1

## 2015-02-08 MED ORDER — HEPARIN SODIUM (PORCINE) 1000 UNIT/ML IJ SOLN
INTRAMUSCULAR | Status: DC | PRN
  Administered 2015-02-08: 6000 [IU] via INTRAVENOUS

## 2015-02-08 MED ORDER — SODIUM CHLORIDE 0.9 % IJ SOLN: 3.0000 mL | INTRAMUSCULAR | Status: DC | PRN

## 2015-02-08 MED ORDER — SODIUM CHLORIDE 0.9 % IJ SOLN: 3.0000 mL | Freq: Two times a day (BID) | INTRAMUSCULAR | Status: DC

## 2015-02-08 MED ORDER — BIVALIRUDIN BOLUS VIA INFUSION - CUPID
INTRAVENOUS | Status: DC | PRN
  Administered 2015-02-08: 102.3 mg via INTRAVENOUS

## 2015-02-08 MED ORDER — SODIUM CHLORIDE 0.9 % IV SOLN: 250.0000 mL | INTRAVENOUS | Status: DC | PRN

## 2015-02-08 MED ORDER — SODIUM CHLORIDE 0.9 % WEIGHT BASED INFUSION
1.0000 mL/kg/h | INTRAVENOUS | Status: DC
  Administered 2015-02-08: 18:00:00 1 mL/kg/h via INTRAVENOUS

## 2015-02-08 MED ORDER — SODIUM CHLORIDE 0.9 % WEIGHT BASED INFUSION: 1.0000 mL/kg/h | INTRAVENOUS | Status: DC

## 2015-02-08 MED ORDER — FUROSEMIDE 10 MG/ML IJ SOLN
40.0000 mg | Freq: Once | INTRAMUSCULAR | Status: AC
  Administered 2015-02-08: 40 mg via INTRAVENOUS
  Filled 2015-02-08: qty 4

## 2015-02-08 MED ORDER — HEPARIN (PORCINE) IN NACL 2-0.9 UNIT/ML-% IJ SOLN
INTRAMUSCULAR | Status: AC
Start: 2015-02-08 — End: 2015-02-08
  Filled 2015-02-08: qty 1500

## 2015-02-08 MED ORDER — NITROGLYCERIN 1 MG/10 ML FOR IR/CATH LAB
INTRA_ARTERIAL | Status: AC
  Filled 2015-02-08: qty 10

## 2015-02-08 MED ORDER — VERAPAMIL HCL 2.5 MG/ML IV SOLN
INTRAVENOUS | Status: AC
  Filled 2015-02-08: qty 2

## 2015-02-08 MED ORDER — FENTANYL CITRATE (PF) 100 MCG/2ML IJ SOLN
INTRAMUSCULAR | Status: DC | PRN
  Administered 2015-02-08: 25 ug via INTRAVENOUS
  Administered 2015-02-08: 50 ug via INTRAVENOUS

## 2015-02-08 MED ORDER — SODIUM CHLORIDE 0.9 % IV SOLN
250.0000 mL | INTRAVENOUS | Status: DC | PRN
Start: 2015-02-08 — End: 2015-02-09

## 2015-02-08 MED ORDER — LIDOCAINE HCL (PF) 1 % IJ SOLN
INTRAMUSCULAR | Status: DC | PRN
  Administered 2015-02-08: 14:00:00

## 2015-02-08 MED ORDER — TICAGRELOR 90 MG PO TABS
90.0000 mg | ORAL_TABLET | Freq: Two times a day (BID) | ORAL | Status: DC
  Administered 2015-02-09 (×2): 90 mg via ORAL
  Filled 2015-02-08 (×2): qty 1

## 2015-02-08 MED ORDER — MIDAZOLAM HCL 2 MG/2ML IJ SOLN
INTRAMUSCULAR | Status: AC
  Filled 2015-02-08: qty 4

## 2015-02-08 MED ORDER — LIDOCAINE HCL (PF) 1 % IJ SOLN
INTRAMUSCULAR | Status: AC
  Filled 2015-02-08: qty 30

## 2015-02-08 MED ORDER — SODIUM CHLORIDE 0.9 % WEIGHT BASED INFUSION: 3.0000 mL/kg/h | INTRAVENOUS | Status: DC

## 2015-02-08 MED ORDER — TICAGRELOR 90 MG PO TABS
ORAL_TABLET | ORAL | Status: DC | PRN
  Administered 2015-02-08: 180 mg via ORAL

## 2015-02-08 SURGICAL SUPPLY — 16 items
BALLN EMERGE MR 2.5X12 (BALLOONS) ×2
BALLOON EMERGE MR 2.5X12 (BALLOONS) IMPLANT
CATH INFINITI 5FR ANG PIGTAIL (CATHETERS) ×2 IMPLANT
CATH OPTITORQUE TIG 4.0 5F (CATHETERS) ×2 IMPLANT
CATH VISTA GUIDE 6FR JR4 (CATHETERS) ×1 IMPLANT
DEVICE RAD COMP TR BAND LRG (VASCULAR PRODUCTS) ×2 IMPLANT
GLIDESHEATH SLEND A-KIT 6F 22G (SHEATH) ×2 IMPLANT
KIT ENCORE 26 ADVANTAGE (KITS) ×1 IMPLANT
KIT HEART LEFT (KITS) ×2 IMPLANT
PACK CARDIAC CATHETERIZATION (CUSTOM PROCEDURE TRAY) ×2 IMPLANT
STENT REBEL MR 4.0X20 (Permanent Stent) ×1 IMPLANT
SYR MEDRAD MARK V 150ML (SYRINGE) ×2 IMPLANT
TRANSDUCER W/STOPCOCK (MISCELLANEOUS) ×2 IMPLANT
TUBING CIL FLEX 10 FLL-RA (TUBING) ×2 IMPLANT
WIRE ASAHI PROWATER 180CM (WIRE) ×1 IMPLANT
WIRE SAFE-T 1.5MM-J .035X260CM (WIRE) ×2 IMPLANT

## 2015-02-08 NOTE — Progress Notes (Signed)
UR Completed Trisha Morandi Graves-Bigelow, RN,BSN [phone]  

## 2015-02-08 NOTE — H&P (View-Only) (Signed)
Subjective:  He has had no recurrence of chest discomfort overnight.  No shortness of breath  Objective:  Vital Signs in the last 24 hours: BP 136/58 mmHg  Pulse 55  Temp(Src) 97.5 F (36.4 C) (Oral)  Resp 17  Ht 6\' 2"  (1.88 m)  Wt 136.351 kg (300 lb 9.6 oz)  BMI 38.58 kg/m2  SpO2 98%  Physical Exam:  Lungs:  Clear Cardiac:  Regular rhythm, normal S1 and S2, no S3 Abdomen:  Soft, nontender, no masses Extremities:  No edema present  Intake/Output from previous day: 08/24 0701 - 08/25 0700 In: -  Out: 750 [Urine:750]  Weight Filed Weights   02/07/15 1905 02/07/15 1907  Weight: 136.351 kg (300 lb 9.6 oz) 136.351 kg (300 lb 9.6 oz)    Lab Results: Basic Metabolic Panel:  Recent Labs  02/07/15 1653 02/07/15 2007  NA 137 137  K 4.0 3.9  CL 103 101  CO2 28 25  GLUCOSE 95 116*  BUN 21* 19  CREATININE 1.21 1.26*   CBC:  Recent Labs  02/07/15 1653 02/07/15 2007 02/08/15 0552  WBC 9.6 10.9* 9.0  NEUTROABS 5.2 5.5  --   HGB 16.1 16.2 14.9  HCT 46.2 46.3 43.3  MCV 86.8 86.7 87.3  PLT 182 186 171   Cardiac Panel (last 3 results)  Recent Labs  02/07/15 2007 02/08/15 0103 02/08/15 0552  TROPONINI <0.03 <0.03 <0.03    Telemetry: Second-degree AV heart block with Mobitz 1 and occasional 2-1-asymptomatic  Assessment/Plan:  1.  Unstable angina with progressive symptoms with exertion 2.  Coronary artery disease with previous stents to the circumflex in 2 locations and the LAD in one location 3.  Severe obesity 4.  Second-degree AV block currently asymptomatic  Recommendations:  Catheterization is planned for this afternoon.  He is currently on IV heparin and has negative enzymes.  Second degree AV block is asymptomatic at the present time.     Kerry Hough  MD [facility]  02/08/2015, 8:59 AM

## 2015-02-08 NOTE — Progress Notes (Signed)
UR Completed Delynn Olvera Graves-Bigelow, RN,BSN [phone]  

## 2015-02-08 NOTE — Interval H&P Note (Signed)
History and Physical Interval Note:  02/08/2015 12:57 PM  [name]  has presented today for surgery, with the diagnosis of unstable angina  The various methods of treatment have been discussed with the patient and family. After consideration of risks, benefits and other options for treatment, the patient has consented to  Procedure(s): Left Heart Cath and Coronary Angiography (N/A) with possible PCI as a surgical intervention .  The patient's history has been reviewed, patient examined, no change in status, stable for surgery.  I have reviewed the patient's chart and labs.  Questions were answered to the patient's satisfaction.     [place], [place] Lab Visit (complete for each Cath Lab visit)  Clinical Evaluation Leading to the Procedure:   ACS: Yes.  - Progressive/Unstable Angina  Non-ACS:    Anginal Classification: CCS III  Anti-ischemic medical therapy: Minimal Therapy (1 class of medications)  Non-Invasive Test Results: No non-invasive testing performed  Prior CABG: No previous CABG   TIMI SCORE  Patient Information:  TIMI Score is 3  UA/NSTEMI and intermediate-risk features (e.g., TIMI score 3?4) for short-term risk of death or nonfatal MI  Revascularization of the presumed culprit artery   A (9)  Indication: 10; Score: 9   [name], [name], M.D., M.S. Interventional Cardiologist   Pager # [phone]

## 2015-02-08 NOTE — Care Management Note (Signed)
Case Management Note  Patient Details  Name: [name] MRN: [id number] Date of Birth: December 31, 1945  Subjective/Objective:   Pt admitted for cp.                  Action/Plan: CM will continue to monitor for disposition needs.    Expected Discharge Date:                  Expected Discharge Plan:  Home/Self Care  In-House Referral:     Discharge planning Services  CM Consult  Post Acute Care Choice:    Choice offered to:     DME Arranged:    DME Agency:     HH Arranged:    HH Agency:     Status of Service:  In process, will continue to follow  Medicare Important Message Given:    Date Medicare IM Given:    Medicare IM give by:    Date Additional Medicare IM Given:    Additional Medicare Important Message give by:     If discussed at [place] of Stay Meetings, dates discussed:    Additional Comments:  Bethena Roys, RN 02/08/2015, 3:25 PM

## 2015-02-08 NOTE — Progress Notes (Signed)
Subjective:  He has had no recurrence of chest discomfort overnight.  No shortness of breath  Objective:  Vital Signs in the last 24 hours: BP 136/58 mmHg  Pulse 55  Temp(Src) 97.5 F (36.4 C) (Oral)  Resp 17  Ht 6\' 2"  (1.88 m)  Wt 136.351 kg (300 lb 9.6 oz)  BMI 38.58 kg/m2  SpO2 98%  Physical Exam:  Lungs:  Clear Cardiac:  Regular rhythm, normal S1 and S2, no S3 Abdomen:  Soft, nontender, no masses Extremities:  No edema present  Intake/Output from previous day: 08/24 0701 - 08/25 0700 In: -  Out: 750 [Urine:750]  Weight Filed Weights   02/07/15 1905 02/07/15 1907  Weight: 136.351 kg (300 lb 9.6 oz) 136.351 kg (300 lb 9.6 oz)    Lab Results: Basic Metabolic Panel:  Recent Labs  02/07/15 1653 02/07/15 2007  NA 137 137  K 4.0 3.9  CL 103 101  CO2 28 25  GLUCOSE 95 116*  BUN 21* 19  CREATININE 1.21 1.26*   CBC:  Recent Labs  02/07/15 1653 02/07/15 2007 02/08/15 0552  WBC 9.6 10.9* 9.0  NEUTROABS 5.2 5.5  --   HGB 16.1 16.2 14.9  HCT 46.2 46.3 43.3  MCV 86.8 86.7 87.3  PLT 182 186 171   Cardiac Panel (last 3 results)  Recent Labs  02/07/15 2007 02/08/15 0103 02/08/15 0552  TROPONINI <0.03 <0.03 <0.03    Telemetry: Second-degree AV heart block with Mobitz 1 and occasional 2-1-asymptomatic  Assessment/Plan:  1.  Unstable angina with progressive symptoms with exertion 2.  Coronary artery disease with previous stents to the circumflex in 2 locations and the LAD in one location 3.  Severe obesity 4.  Second-degree AV block currently asymptomatic  Recommendations:  Catheterization is planned for this afternoon.  He is currently on IV heparin and has negative enzymes.  Second degree AV block is asymptomatic at the present time.     Kerry Hough  MD [facility]  02/08/2015, 8:59 AM

## 2015-02-08 NOTE — Progress Notes (Signed)
ANTICOAGULATION CONSULT NOTE - Follow-up Consult  Pharmacy Consult for Heparin Indication: chest pain/ACS  Not on File  Patient Measurements: Height: 6\' 2"  (188 cm) Weight: (!) 300 lb 9.6 oz (136.351 kg) IBW/kg (Calculated) : 82.2  Heparin dosing wt: 112 kg  Vital Signs: Temp: 97.5 F (36.4 C) (08/25 0521) Temp Source: Oral (08/25 0521) BP: 136/58 mmHg (08/25 0521) Pulse Rate: 55 (08/25 0521)  Labs:  Recent Labs  02/07/15 1653 02/07/15 2007 02/08/15 0103 02/08/15 0552  HGB 16.1 16.2  --  14.9  HCT 46.2 46.3  --  43.3  PLT 182 186  --  171  LABPROT 14.4  --   --   --   INR 1.10  --   --   --   HEPARINUNFRC  --   --   --  0.42  CREATININE 1.21 1.26*  --   --   TROPONINI <0.03 <0.03 <0.03  --     Estimated Creatinine Clearance: 80.2 mL/min (by C-G formula based on Cr of 1.26).   Assessment: 43 yoM on heparin for r/o ACS. Heparin level 0.42 - therapeutic - on 1500 units/hr. CBC stable. Scheduled for cath today at 1330.  Goal of Therapy:  Heparin level 0.3-0.7 units/ml Monitor platelets by anticoagulation protocol: Yes   Plan:  Continue heparin infusion at 1500 units/hr F/u post cath  Sherlon Handing, PharmD, BCPS Clinical pharmacist, pager [phone]  02/08/2015,6:33 AM

## 2015-02-09 ENCOUNTER — Encounter (HOSPITAL_COMMUNITY): Payer: Self-pay | Admitting: Cardiology

## 2015-02-09 DIAGNOSIS — I200 Unstable angina: Secondary | ICD-10-CM

## 2015-02-09 DIAGNOSIS — I441 Atrioventricular block, second degree: Secondary | ICD-10-CM

## 2015-02-09 LAB — CBC
HCT: 44.4 % (ref 39.0–52.0)
HEMOGLOBIN: 14.9 g/dL (ref 13.0–17.0)
MCH: 29.6 pg (ref 26.0–34.0)
MCHC: 33.6 g/dL (ref 30.0–36.0)
MCV: 88.3 fL (ref 78.0–100.0)
Platelets: 172 10*3/uL (ref 150–400)
RBC: 5.03 MIL/uL (ref 4.22–5.81)
RDW: 14.4 % (ref 11.5–15.5)
WBC: 9.5 10*3/uL (ref 4.0–10.5)

## 2015-02-09 LAB — BASIC METABOLIC PANEL
ANION GAP: 5 (ref 5–15)
BUN: 18 mg/dL (ref 6–20)
CHLORIDE: 101 mmol/L (ref 101–111)
CO2: 31 mmol/L (ref 22–32)
Calcium: 9.2 mg/dL (ref 8.9–10.3)
Creatinine, Ser: 1.63 mg/dL — ABNORMAL HIGH (ref 0.61–1.24)
GFR calc non Af Amer: 41 mL/min — ABNORMAL LOW (ref >60)
GFR, EST AFRICAN AMERICAN: 48 mL/min — AB (ref >60)
Glucose, Bld: 124 mg/dL — ABNORMAL HIGH (ref 65–99)
POTASSIUM: 4.3 mmol/L (ref 3.5–5.1)
SODIUM: 137 mmol/L (ref 135–145)

## 2015-02-09 MED ORDER — NITROGLYCERIN 0.4 MG SL SUBL: 0.4000 mg | SUBLINGUAL_TABLET | SUBLINGUAL | Status: DC | PRN

## 2015-02-09 MED ORDER — ASPIRIN 81 MG PO TBEC: 81.0000 mg | DELAYED_RELEASE_TABLET | Freq: Every day | ORAL | Status: DC

## 2015-02-09 MED ORDER — TICAGRELOR 90 MG PO TABS: 90.0000 mg | ORAL_TABLET | Freq: Two times a day (BID) | ORAL | Status: DC

## 2015-02-09 MED FILL — Lidocaine HCl Local Preservative Free (PF) Inj 1%: INTRAMUSCULAR | Qty: 30 | Status: AC

## 2015-02-09 NOTE — Progress Notes (Signed)
Heart rate  as low as 28 and rhythm 2nd degree AV block 1 & 2.  CMT called me with possible brief 3rdHB at 0359. Zoll pads placed on patient. He is asymptomatic with a BP of 149/62.  Dr. Percival Spanish was notified with these details and plan is to continue to monitor patient .

## 2015-02-09 NOTE — Progress Notes (Signed)
CARDIAC REHAB PHASE I   PRE:  Rate/Rhythm: 51 SB 2HB  BP:  Supine: 138/50  Sitting:   Standing:    SaO2:   MODE:  Ambulation: 1000 ft   POST:  Rate/Rhythm: 73 SR  To 52 SB 2HB with rest  BP:  Supine:   Sitting: 145/38  Standing:     SaO2:  [id number] Pt seen by cardiologist prior to walk. Pt walked 1000 ft stopping frequently to stretch his back. Back pain much better after walk. No dizziness or CP. Tolerated well. Education completed with pt and daughter who voiced understanding. Discussed importance of brilinta with stent. Needs to see case manager. Discussed CRP 2 and permission given to refer to [facility]. Discussed portions and healthier food choices to encourage pt to lose weight. Stated when he was going to gym that he lost about 15 pounds but has not been able to go last few months. Gave walking instructions and encouraged PHASE 2.   Graylon Good, RN BSN  02/09/2015 9:13 AM

## 2015-02-09 NOTE — Progress Notes (Signed)
CM spoke to pt regarding Brilinta and provided pt with booklet with 30 day free card enclosed. Pt verbally stated understanding of card usage. [facility]/ [place] called by CM and confirmed medication is instock,  pt  made aware. No other needs identified. Whitman Hero RN,BSN,CM [phone]

## 2015-02-09 NOTE — Discharge Summary (Signed)
Physician Discharge Summary  Patient ID: [name] MRN: [id number] DOB/AGE: 70-Dec-1947 70 y.o.  Admit date: 02/07/2015 Discharge date: 02/09/2015  Primary Physician: Dr. Maury Dus   Primary Discharge Diagnosis:  1.  Unstable angina pectoris-successful placement of a bare-metal stent to the mid right coronary artery  Secondary Discharge Diagnosis: 2.  Second-degree A-V block-asymptomatic 3.  Hypertension 4.  Hyperlipidemia 5.  Obesity severe 6.  Sleep apnea 7.  Coronary artery disease with previous stenting of the LAD, circumflex and now the right coronary artery 8.  History of peptic ulcer disease  Procedures:  Cardiac catheterization with stenting of the right coronary artery  Consults:  Electrophysiology  Hospital Course: This 69 year old male has a history of coronary artery disease. He had a non-STEMI in 1998 and had a MultiLink stent placed in the circumflex during that admission. He was admitted in 2007 again with a non-STEMI and had 2 non-drug-eluting stents placed in the LAD and circumflex. He has done well since that time without significant angina. He was hospitalized in April of this year with a GI illness and was noted to have second degree AV block. He was seen by electrophysiology at that time and was observed. He was seen back in the office in June at which time he was asymptomatic.  About a week ago he began to experience exertional substernal burning type pain with radiation up the left side of his neck that would be relieved with rest and also after taking 2 times. He had absolutely no symptoms at rest. He went in the day of admission for a regular 6 month appointment with his primary doctor in the above history was noted and he was significantly bradycardic and his primary doctor's office and was sent to the emergency room. Initial troponin was negative in light of progressive symptoms he is admitted for further evaluation. His last episode of  discomfort was the morning of admission. The patient does have a history of sleep apnea and wears sleep apnea. He denies PND, orthopnea or edema.  The patient had second-degree AV block with slow heart rates but was asymptomatic.  He was placed on heparin and underwent cardiac catheterization the next day.  He had a 95% mid right coronary artery stenosis.  The previously placed stents to the LAD and circumflex were both patent.  Left ventricular function was normal.  He underwent stenting with a Rebel 4.0 x 20 mm stent with an excellent result.  He did well after the procedure but that evening was noted to have bradycardia with 2-1 heart block as well as possible transient third-degree heart block while he was sleeping.  He was asymptomatic was fine the next morning.  He was seen by Dr. Lovena Le prior to discharge who felt that he had nocturnal bradycardia possibly related to sleep apnea and had marked first degree AV block as well as second-degree AV block.  He did not think he had any indication for permanent pacemaker and felt that he should avoid AV nodal blocking agents.  He stated that he Sunday likely would need a permanent pacemaker but that there was no indication for this.  Care of his catheterization site was discussed with him as well as the need to not discontinue any anti-platelet agents.    Discharge Exam: Blood pressure 112/91, pulse 57, temperature 97.7 F (36.5 C), temperature source Oral, resp. rate 21, height 6\' 2"  (1.88 m), weight 135.9 kg (299 lb 9.7 oz), SpO2 97 %. Weight: 135.9 kg (299 lb 9.7  oz)   Labs: CBC:   Lab Results  Component Value Date   WBC 9.5 02/09/2015   HGB 14.9 02/09/2015   HCT 44.4 02/09/2015   MCV 88.3 02/09/2015   PLT 172 02/09/2015   CMP:  Recent Labs Lab 02/07/15 2007 02/09/15 0310  NA 137 137  K 3.9 4.3  CL 101 101  CO2 25 31  BUN 19 18  CREATININE 1.26* 1.63*  CALCIUM 10.1 9.2  PROT 7.7  --   BILITOT 1.1  --   ALKPHOS 60  --   ALT 25   --   AST 31  --   GLUCOSE 116* 124*   Lipid Panel     Component Value Date/Time   CHOL 123 02/08/2015 0552   TRIG 178* 02/08/2015 0552   HDL 28* 02/08/2015 0552   CHOLHDL 4.4 02/08/2015 0552   VLDL 36 02/08/2015 0552   LDLCALC 59 02/08/2015 0552   Cardiac Enzymes:  Recent Labs  02/07/15 2007 02/08/15 0103 02/08/15 0552  TROPONINI <0.03 <0.03 <0.03   Thyroid: Lab Results  Component Value Date   TSH 1.520 02/07/2015   EKG: Sinus rhythm with second-degree AV block type I  Discharge Medications:   Medication List    TAKE these medications        acetaminophen 500 MG tablet  Commonly known as:  TYLENOL  Take 1,500 mg by mouth daily as needed for headache.     allopurinol 300 MG tablet  Commonly known as:  ZYLOPRIM  Take 300 mg by mouth daily.     amLODipine 10 MG tablet  Commonly known as:  NORVASC  Take 10 mg by mouth daily.     aspirin 81 MG EC tablet  Take 1 tablet (81 mg total) by mouth daily.  Start taking on:  02/10/2015     atorvastatin 20 MG tablet  Commonly known as:  LIPITOR  Take 20 mg by mouth every other day.     diphenhydrAMINE 25 mg capsule  Commonly known as:  BENADRYL  Take 50-75 mg by mouth daily as needed (for hayfever).     hydrochlorothiazide 25 MG tablet  Commonly known as:  HYDRODIURIL  Take 25 mg by mouth daily.     losartan 100 MG tablet  Commonly known as:  COZAAR  Take 100 mg by mouth daily.     nitroGLYCERIN 0.4 MG SL tablet  Commonly known as:  NITROSTAT  Place 1 tablet (0.4 mg total) under the tongue every 5 (five) minutes x 3 doses as needed for chest pain.     omeprazole 20 MG capsule  Commonly known as:  PRILOSEC  Take 20 mg by mouth daily.     ticagrelor 90 MG Tabs tablet  Commonly known as:  BRILINTA  Take 1 tablet (90 mg total) by mouth 2 (two) times daily.       Followup plans and appointments: Follow up with Dr. [name] in one week  Time spent with patient to include physician time:  30  minutes  Signed: W. Doristine Church. MD [facility] 02/09/2015, 8:48 AM

## 2015-02-09 NOTE — Consult Note (Signed)
Patient ID: [name] MRN: [id number], DOB/AGE: 1945-07-23   Admit date: 02/07/2015   Primary Physician: [name], MD Primary Cardiologist: Dr. [name]   Pt. Profile:  69 y/o obese male with h/o OSA, HTN and CAD, admitted for unstable angina s/p PCI to the mid RCA, being evaluated for asymptomatic second-degree AV block/ severe nocturnal bradycardia.   Problem List  Past Medical History  Diagnosis Date  . Hypertension   . Prostate cancer   . Complication of anesthesia     "they give me too much anesthesia & had to put me on life support for a little bit w/gallbladder OR"  . Coronary artery disease   . Hyperlipidemia   . Myocardial infarction 1995; 2007  . Pneumonia X 2  . OSA on CPAP   . GERD (gastroesophageal reflux disease)   . History of bleeding ulcers   . Arthritis     "left shoulder" (02/09/2015)  . History of gout   . Kidney stones   . Blind left eye     Past Surgical History  Procedure Laterality Date  . Appendectomy  ~ 1985  . Laparoscopic cholecystectomy    . Back surgery    . Posterior lumbar fusion  2003?  Marland Kitchen Cardiac catheterization N/A 02/08/2015    Procedure: Left Heart Cath and Coronary Angiography;  Surgeon: Leonie Man, MD;  Location: [facility];  Service: Cardiovascular;  Laterality: N/A;  . Cardiac catheterization N/A 02/08/2015    Procedure: Coronary Stent Intervention;  Surgeon: Leonie Man, MD;  Location: [facility];  Service: Cardiovascular;  Laterality: N/A;  . Coronary angioplasty with stent placement  ~ 1995; 2007    "1 stent; 2 stents"  . Prostate biopsy  2015  . Lithotripsy  X 1     Allergies  No Known Allergies  HPI  69 y/o male followed by Dr. [name] with known coronary disease. In 1998, he suffered an NSTEMI and underwent PCI + stenting to his circumflex. He was admitted for another NSTEMI in 2007 and underwent PCI to the LAD and repeat intervention to the circumflex. His other PMH is  significant for morbid obesity, OSA and HTN.  In April of this year, he was admitted to [facility] with a GI illness and was noted to have 2nd degree AVB. (note patient has 2 MRNs and the majority of his records including hospitalization and EP eval from 09/2014 is listed under [id number]). During that time, he was evaluated by EP and seen by Dr. Rayann Heman. He noted, "Mobitz I second degree AV block is likely exacerbated by GI illness but is also common in the setting of morbid obesity and sleep apnea". Since he was asymptomatic, Dr. Rayann Heman felt that no further EP w/u was indicated.He recommended avoidance of  AV nodal agents and encouraged use CPAP when asleep as well as weight loss. The patient was discharged home and continued f/u with Dr. [name].  On 02/07/15, he presented back to [facility] with complaints of exertional chest pain. He was noted to be in second-degree AV block with a SVR on arrival but was noted to be asymptomatic. Cardiac enzymes were cycled and were negative x 3. He was placed on IV heparin and underwent cardiac catheterization. He had a 95% mid right coronary artery stenosis. The previously placed stents to the LAD and circumflex were both patent. Left ventricular function was normal. EF 55-60%. He underwent stenting with a Rebel 4.0 x 20 mm stent with an excellent  result. His pain resolved but he continued to have bradycardia with 2-1 heart block as well as possible transient third-degree heart block while he was sleeping. EP consulted for recommendations.    Patient notes that he has remained asymptomatic with his bradycardia. He denies symptoms of fatigue, weakness, dyspnea, dizziness, syncope/ near syncope. His exertional chest pain resolved following PCI. On telemetry, he is currently in SR/ sinus brady with rates in the 50s-low 60s. Review of telemetry over the last 12 hrs reveals severe nocturnal bradycardia/ 2:1 HB. Nocturnal rates were in the 20-30 range. He reports that he did sleep with  his CPAP last night and has been fully compliant at home.     Home Medications  Prior to Admission medications   Medication Sig Start Date End Date Taking? Authorizing Provider  acetaminophen (TYLENOL) 500 MG tablet Take 1,500 mg by mouth daily as needed for headache.   Yes Historical Provider, MD  allopurinol (ZYLOPRIM) 300 MG tablet Take 300 mg by mouth daily.   Yes Historical Provider, MD  amLODipine (NORVASC) 10 MG tablet Take 10 mg by mouth daily.   Yes Historical Provider, MD  atorvastatin (LIPITOR) 20 MG tablet Take 20 mg by mouth every other day.   Yes Historical Provider, MD  diphenhydrAMINE (BENADRYL) 25 mg capsule Take 50-75 mg by mouth daily as needed (for hayfever).   Yes Historical Provider, MD  hydrochlorothiazide (HYDRODIURIL) 25 MG tablet Take 25 mg by mouth daily.   Yes Historical Provider, MD  losartan (COZAAR) 100 MG tablet Take 100 mg by mouth daily.   Yes Historical Provider, MD  omeprazole (PRILOSEC) 20 MG capsule Take 20 mg by mouth daily.   Yes Historical Provider, MD  aspirin EC 81 MG EC tablet Take 1 tablet (81 mg total) by mouth daily. 02/10/15   Jacolyn Reedy, MD  nitroGLYCERIN (NITROSTAT) 0.4 MG SL tablet Place 1 tablet (0.4 mg total) under the tongue every 5 (five) minutes x 3 doses as needed for chest pain. 02/09/15   Jacolyn Reedy, MD  ticagrelor (BRILINTA) 90 MG TABS tablet Take 1 tablet (90 mg total) by mouth 2 (two) times daily. 02/09/15   Jacolyn Reedy, MD    Family History  Family History  Problem Relation Age of Onset  . Hypertension Father     Social History  Social History   Social History  . Marital Status: Married    Spouse Name: N/A  . Number of Children: N/A  . Years of Education: N/A   Occupational History  . Not on file.   Social History Main Topics  . Smoking status: Former Smoker -- 3.00 packs/day for 35 years    Types: Cigarettes  . Smokeless tobacco: Never Used     Comment: "quit smoking cigarettes in 1985  .  Alcohol Use: Yes     Comment: "I drank a whole lot when I was younger; nothing since 1990"  . Drug Use: No  . Sexual Activity: No   Other Topics Concern  . Not on file   Social History Narrative     Review of Systems General:  No chills, fever, night sweats or weight changes.  Cardiovascular:  No chest pain, dyspnea on exertion, edema, orthopnea, palpitations, paroxysmal nocturnal dyspnea. Dermatological: No rash, lesions/masses Respiratory: No cough, dyspnea Urologic: No hematuria, dysuria Abdominal:   No nausea, vomiting, diarrhea, bright red blood per rectum, melena, or hematemesis Neurologic:  No visual changes, wkns, changes in mental status. All other systems reviewed and  are otherwise negative except as noted above.  Physical Exam  Blood pressure 145/38, pulse 62, temperature 97.9 F (36.6 C), temperature source Oral, resp. rate 20, height 6\' 2"  (1.88 m), weight 299 lb 9.7 oz (135.9 kg), SpO2 97 %.  General: Pleasant, NAD, obese Psych: Normal affect. Neuro: Alert and oriented X 3. Moves all extremities spontaneously. HEENT: Normal  Neck: Supple without bruits or JVD. Lungs:  Resp regular and unlabored, + rhonchi in RUL Heart: distant heart sounds (likely 2/2 body habitus). Regular rhythm, slow rate. Abdomen: Soft, non-tender, non-distended, BS + x 4.  Extremities: No clubbing, cyanosis or edema. DP/PT/Radials 2+ and equal bilaterally.  Labs  Troponin (Point of Care Test) No results for input(s): TROPIPOC in the last 72 hours.  Recent Labs  02/07/15 1653 02/07/15 2007 02/08/15 0103 02/08/15 0552  TROPONINI <0.03 <0.03 <0.03 <0.03   Lab Results  Component Value Date   WBC 9.5 02/09/2015   HGB 14.9 02/09/2015   HCT 44.4 02/09/2015   MCV 88.3 02/09/2015   PLT 172 02/09/2015     Recent Labs Lab 02/07/15 2007 02/09/15 0310  NA 137 137  K 3.9 4.3  CL 101 101  CO2 25 31  BUN 19 18  CREATININE 1.26* 1.63*  CALCIUM 10.1 9.2  PROT 7.7  --   BILITOT 1.1   --   ALKPHOS 60  --   ALT 25  --   AST 31  --   GLUCOSE 116* 124*   Lab Results  Component Value Date   CHOL 123 02/08/2015   HDL 28* 02/08/2015   LDLCALC 59 02/08/2015   TRIG 178* 02/08/2015   No results found for: DDIMER   Radiology/Studies  Dg Chest Port 1 View  02/07/2015   CLINICAL DATA:  Burning in chest, hypertension, chest pain  EXAM: PORTABLE CHEST - 1 VIEW  COMPARISON:  Portable exam 1730 hours without priors for comparison.  FINDINGS: Enlargement of cardiac silhouette.  Mediastinal contours normal.  Slight pulmonary vascular congestion.  External pacing leads project over LEFT chest.  No gross infiltrate, pleural effusion or pneumothorax.  IMPRESSION: Enlargement of cardiac silhouette with minimal pulmonary vascular congestion.  Remainder of exam unremarkable.   Electronically Signed   By: Lavonia Dana M.D.   On: 02/07/2015 17:31    ECG  2nd degree AVB Mobitz I    ASSESSMENT AND PLAN  1. Second Degree AVB Mobitz I: also with severe nocturnal bradycardia with HR as low as the 20s. He has remained completely asymptomatic, denying symptoms of fatigue, weakness, dyspnea, dizziness, syncope/ near syncope. He underwent recent PCI to mid RCA with excellent angiographic result. Normal LVF with EF at 55-60%.  No further CP. Electrolytes have been stable. TSH WNL. He was seen by Dr. Rayann Heman for same problem 09/2014. He felt that his 2nd Degree AVB was likely 2/2 to obesity and OSA and encouraged weight loss and regular use of CPAP but no further EP w/u. He reports full compliance with CPAP at home. Will get Dr. Lovena Le to assess to see if any further recommendations or w/u needed.    Signed, Lyda Jester, PA-C 02/09/2015, 11:56 AM  EP Attending   Patient seen and examined. I concur with the findings as noted by Lyda Jester, PA-C. The patient has nocturnal bradycardia, possibly related to sleep apnea, and AVWB in the setting of marked first degree AV block. He has no  indication for PPM. He should avoid AV nodal blocking drugs. He will someday likely need  a PPM but currently there is no indication. [place] for discharge home from my perspective. He may followup with Dr. [name].  Cristopher Peru, M.D.

## 2015-02-11 DIAGNOSIS — G4733 Obstructive sleep apnea (adult) (pediatric): Secondary | ICD-10-CM | POA: Diagnosis not present

## 2015-02-12 ENCOUNTER — Encounter: Payer: Self-pay | Admitting: Cardiology

## 2015-02-16 DIAGNOSIS — E785 Hyperlipidemia, unspecified: Secondary | ICD-10-CM | POA: Diagnosis not present

## 2015-02-16 DIAGNOSIS — G4733 Obstructive sleep apnea (adult) (pediatric): Secondary | ICD-10-CM | POA: Diagnosis not present

## 2015-02-16 DIAGNOSIS — I2510 Atherosclerotic heart disease of native coronary artery without angina pectoris: Secondary | ICD-10-CM | POA: Diagnosis not present

## 2015-02-16 DIAGNOSIS — I441 Atrioventricular block, second degree: Secondary | ICD-10-CM | POA: Diagnosis not present

## 2015-02-16 DIAGNOSIS — E668 Other obesity: Secondary | ICD-10-CM | POA: Diagnosis not present

## 2015-02-16 DIAGNOSIS — I119 Hypertensive heart disease without heart failure: Secondary | ICD-10-CM | POA: Diagnosis not present

## 2015-02-27 DIAGNOSIS — C61 Malignant neoplasm of prostate: Secondary | ICD-10-CM | POA: Diagnosis not present

## 2015-03-06 ENCOUNTER — Encounter (HOSPITAL_COMMUNITY): Admission: RE | Admit: 2015-03-06 | Payer: Commercial Managed Care - HMO | Source: Ambulatory Visit

## 2015-03-14 DIAGNOSIS — G4733 Obstructive sleep apnea (adult) (pediatric): Secondary | ICD-10-CM | POA: Diagnosis not present

## 2015-03-18 DIAGNOSIS — G4733 Obstructive sleep apnea (adult) (pediatric): Secondary | ICD-10-CM | POA: Diagnosis not present

## 2015-03-27 ENCOUNTER — Encounter (HOSPITAL_COMMUNITY)
Admission: RE | Admit: 2015-03-27 | Discharge: 2015-03-27 | Disposition: A | Discharge status: Home / Self Care | Payer: Commercial Managed Care - HMO | Source: Ambulatory Visit | Attending: Cardiology | Admitting: Cardiology

## 2015-03-27 ENCOUNTER — Encounter (HOSPITAL_COMMUNITY): Payer: Self-pay

## 2015-03-27 VITALS — BP 128/74 | HR 60 | Ht 73.0 in | Wt 303.6 lb

## 2015-03-27 DIAGNOSIS — I2510 Atherosclerotic heart disease of native coronary artery without angina pectoris: Secondary | ICD-10-CM | POA: Diagnosis not present

## 2015-03-27 DIAGNOSIS — Z955 Presence of coronary angioplasty implant and graft: Secondary | ICD-10-CM

## 2015-03-27 DIAGNOSIS — Z9861 Coronary angioplasty status: Secondary | ICD-10-CM

## 2015-03-27 NOTE — Progress Notes (Addendum)
Cardiac/Pulmonary Rehab Medication Review by a Pharmacist  Does the patient  feel that his/her medications are working for him/her?  yes  Has the patient been experiencing any side effects to the medications prescribed?  yes  Does the patient measure his/her own blood pressure or blood glucose at home?  no   Does the patient have any problems obtaining medications due to transportation or finances?   no  Understanding of regimen: excellent Understanding of indications: excellent Potential of compliance: excellent  Questions asked to Determine Patient Understanding of Medication Regimen:  1. What is the name of the medication?  2. What is the medication used for?  3. When should it be taken?  4. How much should be taken?  5. How will you take it?  6. What side effects should you report?  Understanding Defined as: Excellent: All questions above are correct Good: Questions 1-4 are correct Fair: Questions 1-2 are correct  Poor: 1 or none of the above questions are correct   Pharmacist comments: Pt understands medication regimen. Had some muscle pain with lipitor, so now he takes it every other day. Compliant with regimen. Continue current plan. Patient could benefit from a blood pressure cuff to monitor at home.  Thanks, Isac Sarna, BS [name], [place] Clinical Pharmacist Pager [phone]  03/27/2015 8:13 AM

## 2015-03-27 NOTE — Patient Instructions (Signed)
Pt has finished orientation and is scheduled to start CR on March 30, 2015 at 0930. Pt has been instructed to arrive to class 15 minutes early for scheduled class. Pt has been instructed to wear comfortable clothing and shoes with rubber soles. Pt has been told to take their medications 1 hour prior to coming to class.  If the patient is not going to attend class, he has been instructed to call.

## 2015-03-27 NOTE — Progress Notes (Signed)
Patient arrived for 1st visit/orientation/education at [name]. Patient was referred to CR by [name] due to 2nd degree AV Block (l44.1) and Stent (Z95.5). During orientation advised patient on arrival and appointment times what to wear, what to do before, during and after exercise. Reviewed attendance and class policy. Talked about inclement weather and class consultation policy. Pt is scheduled to return Cardiac Rehab on March 30, 2015 at 0930. Pt was advised to come to class 5 minutes before class starts. He was also given instructions on meeting with the dietician and attending the Family Structure classes. Patient instructed on pursed lip breathing. PHQ score was 1, no counseling needed.  Pt is eager to get started. Patient was able to complete 6 minute walk test. Patient was measured for the equipment. Discussed equipment safety with patient. Took patient pre-anthropometric measurements. Patient finished visit at 0920.

## 2015-03-30 ENCOUNTER — Encounter (HOSPITAL_COMMUNITY)
Admission: RE | Admit: 2015-03-30 | Discharge: 2015-03-30 | Disposition: A | Discharge status: Home / Self Care | Payer: Commercial Managed Care - HMO | Source: Ambulatory Visit | Attending: Cardiology | Admitting: Cardiology

## 2015-03-30 DIAGNOSIS — Z955 Presence of coronary angioplasty implant and graft: Secondary | ICD-10-CM | POA: Diagnosis not present

## 2015-03-30 DIAGNOSIS — I2510 Atherosclerotic heart disease of native coronary artery without angina pectoris: Secondary | ICD-10-CM | POA: Diagnosis not present

## 2015-04-02 ENCOUNTER — Encounter (HOSPITAL_COMMUNITY)
Admission: RE | Admit: 2015-04-02 | Discharge: 2015-04-02 | Disposition: A | Discharge status: Home / Self Care | Payer: Commercial Managed Care - HMO | Source: Ambulatory Visit | Attending: Cardiology | Admitting: Cardiology

## 2015-04-02 DIAGNOSIS — Z955 Presence of coronary angioplasty implant and graft: Secondary | ICD-10-CM | POA: Diagnosis not present

## 2015-04-02 DIAGNOSIS — I2510 Atherosclerotic heart disease of native coronary artery without angina pectoris: Secondary | ICD-10-CM | POA: Diagnosis not present

## 2015-04-03 NOTE — Progress Notes (Signed)
Cardiac Rehabilitation Program Outcomes Report   Orientation:  03/27/15 Graduate Date:  tbd Discharge Date:  tbd # of sessions completed: 3  Cardiologist: Rosalita Chessman MD:  Aundra Millet Time:  0930  A.  Exercise Program:  Tolerates exercise @ 3.80 METS for 15 minutes and Walk Test Results:  Pre: 2.59 mets  B.  Mental Health:  Good mental attitude and PHQ-9: 1  C.  Education/Instruction/Skills  Accurately checks own pulse.  Rest:  62  Exercise:  97  Uses Perceived Exertion Scale and/or Dyspnea Scale  D.  Nutrition/Weight Control/Body Composition:  Adherence to prescribed nutrition program: fair    E.  Blood Lipids    Lab Results  Component Value Date   CHOL 123 02/08/2015   HDL 28* 02/08/2015   LDLCALC 59 02/08/2015   TRIG 178* 02/08/2015   CHOLHDL 4.4 02/08/2015    F.  Lifestyle Changes:  Making positive lifestyle changes and Not smoking:  Quit 1985  G.  Symptoms noted with exercise:  Asymptomatic  Report Completed By:  Stevphen Rochester RN   Comments:  This is the patients first week progress note for [facility].

## 2015-04-04 ENCOUNTER — Encounter (HOSPITAL_COMMUNITY)
Admission: RE | Admit: 2015-04-04 | Discharge: 2015-04-04 | Disposition: A | Discharge status: Home / Self Care | Payer: Commercial Managed Care - HMO | Source: Ambulatory Visit | Attending: Cardiology | Admitting: Cardiology

## 2015-04-04 DIAGNOSIS — I2510 Atherosclerotic heart disease of native coronary artery without angina pectoris: Secondary | ICD-10-CM | POA: Diagnosis not present

## 2015-04-04 DIAGNOSIS — Z955 Presence of coronary angioplasty implant and graft: Secondary | ICD-10-CM | POA: Diagnosis not present

## 2015-04-06 ENCOUNTER — Encounter (HOSPITAL_COMMUNITY)
Admission: RE | Admit: 2015-04-06 | Discharge: 2015-04-06 | Disposition: A | Discharge status: Home / Self Care | Payer: Commercial Managed Care - HMO | Source: Ambulatory Visit | Attending: Cardiology | Admitting: Cardiology

## 2015-04-06 DIAGNOSIS — Z955 Presence of coronary angioplasty implant and graft: Secondary | ICD-10-CM | POA: Diagnosis not present

## 2015-04-06 DIAGNOSIS — I2510 Atherosclerotic heart disease of native coronary artery without angina pectoris: Secondary | ICD-10-CM | POA: Diagnosis not present

## 2015-04-09 ENCOUNTER — Encounter (HOSPITAL_COMMUNITY): Admission: RE | Admit: 2015-04-09 | Payer: Commercial Managed Care - HMO | Source: Ambulatory Visit

## 2015-04-11 ENCOUNTER — Encounter (HOSPITAL_COMMUNITY)
Admission: RE | Admit: 2015-04-11 | Discharge: 2015-04-11 | Disposition: A | Discharge status: Home / Self Care | Payer: Commercial Managed Care - HMO | Source: Ambulatory Visit | Attending: Cardiology | Admitting: Cardiology

## 2015-04-11 DIAGNOSIS — I2510 Atherosclerotic heart disease of native coronary artery without angina pectoris: Secondary | ICD-10-CM | POA: Diagnosis not present

## 2015-04-11 DIAGNOSIS — Z955 Presence of coronary angioplasty implant and graft: Secondary | ICD-10-CM | POA: Diagnosis not present

## 2015-04-13 ENCOUNTER — Encounter (HOSPITAL_COMMUNITY): Payer: Commercial Managed Care - HMO

## 2015-04-13 DIAGNOSIS — G4733 Obstructive sleep apnea (adult) (pediatric): Secondary | ICD-10-CM | POA: Diagnosis not present

## 2015-04-16 ENCOUNTER — Encounter (HOSPITAL_COMMUNITY): Payer: Commercial Managed Care - HMO

## 2015-04-18 ENCOUNTER — Encounter (HOSPITAL_COMMUNITY)
Admission: RE | Admit: 2015-04-18 | Discharge: 2015-04-18 | Disposition: A | Discharge status: Home / Self Care | Payer: Commercial Managed Care - HMO | Source: Ambulatory Visit | Attending: Cardiology | Admitting: Cardiology

## 2015-04-18 DIAGNOSIS — G4733 Obstructive sleep apnea (adult) (pediatric): Secondary | ICD-10-CM | POA: Diagnosis not present

## 2015-04-18 DIAGNOSIS — Z955 Presence of coronary angioplasty implant and graft: Secondary | ICD-10-CM | POA: Diagnosis not present

## 2015-04-18 DIAGNOSIS — I2510 Atherosclerotic heart disease of native coronary artery without angina pectoris: Secondary | ICD-10-CM | POA: Insufficient documentation

## 2015-04-20 ENCOUNTER — Encounter (HOSPITAL_COMMUNITY)
Admission: RE | Admit: 2015-04-20 | Discharge: 2015-04-20 | Disposition: A | Discharge status: Home / Self Care | Payer: Commercial Managed Care - HMO | Source: Ambulatory Visit | Attending: Cardiology | Admitting: Cardiology

## 2015-04-20 DIAGNOSIS — I2510 Atherosclerotic heart disease of native coronary artery without angina pectoris: Secondary | ICD-10-CM | POA: Diagnosis not present

## 2015-04-20 DIAGNOSIS — Z955 Presence of coronary angioplasty implant and graft: Secondary | ICD-10-CM | POA: Diagnosis not present

## 2015-04-23 ENCOUNTER — Encounter (HOSPITAL_COMMUNITY)
Admission: RE | Admit: 2015-04-23 | Discharge: 2015-04-23 | Disposition: A | Discharge status: Home / Self Care | Payer: Commercial Managed Care - HMO | Source: Ambulatory Visit | Attending: Cardiology | Admitting: Cardiology

## 2015-04-23 DIAGNOSIS — Z955 Presence of coronary angioplasty implant and graft: Secondary | ICD-10-CM | POA: Diagnosis not present

## 2015-04-23 DIAGNOSIS — I2510 Atherosclerotic heart disease of native coronary artery without angina pectoris: Secondary | ICD-10-CM | POA: Diagnosis not present

## 2015-04-23 NOTE — Progress Notes (Addendum)
04/23/15-Patient has had HR in 30s the last 3 sessions. Patient has been asymptomatic. Dr. Ezekiel Slocumb office called and stated will give the message to Dr. [name].

## 2015-04-25 ENCOUNTER — Encounter (HOSPITAL_COMMUNITY)
Admission: RE | Admit: 2015-04-25 | Discharge: 2015-04-25 | Disposition: A | Discharge status: Home / Self Care | Payer: Commercial Managed Care - HMO | Source: Ambulatory Visit | Attending: Cardiology | Admitting: Cardiology

## 2015-04-25 DIAGNOSIS — I2510 Atherosclerotic heart disease of native coronary artery without angina pectoris: Secondary | ICD-10-CM | POA: Diagnosis not present

## 2015-04-25 DIAGNOSIS — Z955 Presence of coronary angioplasty implant and graft: Secondary | ICD-10-CM | POA: Diagnosis not present

## 2015-04-27 ENCOUNTER — Encounter (HOSPITAL_COMMUNITY)
Admission: RE | Admit: 2015-04-27 | Discharge: 2015-04-27 | Disposition: A | Discharge status: Home / Self Care | Payer: Commercial Managed Care - HMO | Source: Ambulatory Visit | Attending: Cardiology | Admitting: Cardiology

## 2015-04-27 DIAGNOSIS — I2510 Atherosclerotic heart disease of native coronary artery without angina pectoris: Secondary | ICD-10-CM | POA: Diagnosis not present

## 2015-04-27 DIAGNOSIS — Z955 Presence of coronary angioplasty implant and graft: Secondary | ICD-10-CM | POA: Diagnosis not present

## 2015-04-30 ENCOUNTER — Encounter (HOSPITAL_COMMUNITY)
Admission: RE | Admit: 2015-04-30 | Discharge: 2015-04-30 | Disposition: A | Discharge status: Home / Self Care | Payer: Commercial Managed Care - HMO | Source: Ambulatory Visit | Attending: Cardiology | Admitting: Cardiology

## 2015-04-30 DIAGNOSIS — I2510 Atherosclerotic heart disease of native coronary artery without angina pectoris: Secondary | ICD-10-CM | POA: Diagnosis not present

## 2015-04-30 DIAGNOSIS — Z955 Presence of coronary angioplasty implant and graft: Secondary | ICD-10-CM | POA: Diagnosis not present

## 2015-05-02 ENCOUNTER — Encounter (HOSPITAL_COMMUNITY)
Admission: RE | Admit: 2015-05-02 | Discharge: 2015-05-02 | Disposition: A | Discharge status: Home / Self Care | Payer: Commercial Managed Care - HMO | Source: Ambulatory Visit | Attending: Cardiology | Admitting: Cardiology

## 2015-05-02 DIAGNOSIS — I2510 Atherosclerotic heart disease of native coronary artery without angina pectoris: Secondary | ICD-10-CM | POA: Diagnosis not present

## 2015-05-02 DIAGNOSIS — Z955 Presence of coronary angioplasty implant and graft: Secondary | ICD-10-CM | POA: Diagnosis not present

## 2015-05-03 NOTE — Progress Notes (Signed)
Patient was given individual home exercise plan. Handout was reviewed and discussed. Patient verbalized an understanding. 

## 2015-05-04 ENCOUNTER — Encounter (HOSPITAL_COMMUNITY)
Admission: RE | Admit: 2015-05-04 | Discharge: 2015-05-04 | Disposition: A | Discharge status: Home / Self Care | Payer: Commercial Managed Care - HMO | Source: Ambulatory Visit | Attending: Cardiology | Admitting: Cardiology

## 2015-05-04 DIAGNOSIS — Z955 Presence of coronary angioplasty implant and graft: Secondary | ICD-10-CM | POA: Diagnosis not present

## 2015-05-04 DIAGNOSIS — I2510 Atherosclerotic heart disease of native coronary artery without angina pectoris: Secondary | ICD-10-CM | POA: Diagnosis not present

## 2015-05-07 ENCOUNTER — Encounter (HOSPITAL_COMMUNITY): Payer: Commercial Managed Care - HMO

## 2015-05-09 ENCOUNTER — Encounter (HOSPITAL_COMMUNITY)
Admission: RE | Admit: 2015-05-09 | Discharge: 2015-05-09 | Disposition: A | Discharge status: Home / Self Care | Payer: Commercial Managed Care - HMO | Source: Ambulatory Visit | Attending: Cardiology | Admitting: Cardiology

## 2015-05-09 DIAGNOSIS — I2510 Atherosclerotic heart disease of native coronary artery without angina pectoris: Secondary | ICD-10-CM | POA: Diagnosis not present

## 2015-05-09 DIAGNOSIS — Z955 Presence of coronary angioplasty implant and graft: Secondary | ICD-10-CM | POA: Diagnosis not present

## 2015-05-11 ENCOUNTER — Encounter (HOSPITAL_COMMUNITY): Payer: Commercial Managed Care - HMO

## 2015-05-14 ENCOUNTER — Encounter (HOSPITAL_COMMUNITY): Payer: Commercial Managed Care - HMO

## 2015-05-14 DIAGNOSIS — G4733 Obstructive sleep apnea (adult) (pediatric): Secondary | ICD-10-CM | POA: Diagnosis not present

## 2015-05-16 ENCOUNTER — Encounter (HOSPITAL_COMMUNITY)
Admission: RE | Admit: 2015-05-16 | Discharge: 2015-05-16 | Disposition: A | Discharge status: Home / Self Care | Payer: Commercial Managed Care - HMO | Source: Ambulatory Visit | Attending: Cardiology | Admitting: Cardiology

## 2015-05-16 DIAGNOSIS — Z955 Presence of coronary angioplasty implant and graft: Secondary | ICD-10-CM | POA: Diagnosis not present

## 2015-05-16 DIAGNOSIS — I2510 Atherosclerotic heart disease of native coronary artery without angina pectoris: Secondary | ICD-10-CM | POA: Diagnosis not present

## 2015-05-18 ENCOUNTER — Encounter (HOSPITAL_COMMUNITY)
Admission: RE | Admit: 2015-05-18 | Discharge: 2015-05-18 | Disposition: A | Discharge status: Home / Self Care | Payer: Commercial Managed Care - HMO | Source: Ambulatory Visit | Attending: Cardiology | Admitting: Cardiology

## 2015-05-18 DIAGNOSIS — Z955 Presence of coronary angioplasty implant and graft: Secondary | ICD-10-CM | POA: Insufficient documentation

## 2015-05-18 DIAGNOSIS — I2510 Atherosclerotic heart disease of native coronary artery without angina pectoris: Secondary | ICD-10-CM | POA: Diagnosis not present

## 2015-05-18 DIAGNOSIS — G4733 Obstructive sleep apnea (adult) (pediatric): Secondary | ICD-10-CM | POA: Diagnosis not present

## 2015-05-21 ENCOUNTER — Encounter (HOSPITAL_COMMUNITY)
Admission: RE | Admit: 2015-05-21 | Discharge: 2015-05-21 | Disposition: A | Discharge status: Home / Self Care | Payer: Commercial Managed Care - HMO | Source: Ambulatory Visit | Attending: Cardiology | Admitting: Cardiology

## 2015-05-21 DIAGNOSIS — I2510 Atherosclerotic heart disease of native coronary artery without angina pectoris: Secondary | ICD-10-CM | POA: Diagnosis not present

## 2015-05-21 DIAGNOSIS — Z955 Presence of coronary angioplasty implant and graft: Secondary | ICD-10-CM | POA: Diagnosis not present

## 2015-05-23 ENCOUNTER — Encounter (HOSPITAL_COMMUNITY)
Admission: RE | Admit: 2015-05-23 | Discharge: 2015-05-23 | Disposition: A | Discharge status: Home / Self Care | Payer: Commercial Managed Care - HMO | Source: Ambulatory Visit | Attending: Cardiology | Admitting: Cardiology

## 2015-05-23 DIAGNOSIS — Z955 Presence of coronary angioplasty implant and graft: Secondary | ICD-10-CM | POA: Diagnosis not present

## 2015-05-23 DIAGNOSIS — I2510 Atherosclerotic heart disease of native coronary artery without angina pectoris: Secondary | ICD-10-CM | POA: Diagnosis not present

## 2015-05-23 NOTE — Progress Notes (Signed)
Cardiac Rehabilitation Program Outcomes Report   Orientation:  03/27/15 Graduate Date:  tbd Discharge Date:  tbd # of sessions completed: 18  Cardiologist: Rosalita Chessman MD:  Aundra Millet Time:  0930  A.  Exercise Program:  Tolerates exercise @ 3.79 METS for 15 minutes  B.  Mental Health:  Good mental attitude  C.  Education/Instruction/Skills  Accurately checks own pulse.  Rest:  53  Exercise:  62 and Knows THR for exercise  Uses Perceived Exertion Scale and/or Dyspnea Scale  D.  Nutrition/Weight Control/Body Composition:  Adherence to prescribed nutrition program: fair    E.  Blood Lipids    Lab Results  Component Value Date   CHOL 123 02/08/2015   HDL 28* 02/08/2015   LDLCALC 59 02/08/2015   TRIG 178* 02/08/2015   CHOLHDL 4.4 02/08/2015    F.  Lifestyle Changes:  Making positive lifestyle changes and Not smoking:  Quit 1985  G.  Symptoms noted with exercise:  Asymptomatic  Report Completed By:  Stevphen Rochester RN   Comments:  This is the patients halfway progress note for [facility]. Patient doing well in program.

## 2015-05-25 ENCOUNTER — Encounter (HOSPITAL_COMMUNITY)
Admission: RE | Admit: 2015-05-25 | Discharge: 2015-05-25 | Disposition: A | Discharge status: Home / Self Care | Payer: Commercial Managed Care - HMO | Source: Ambulatory Visit | Attending: Cardiology | Admitting: Cardiology

## 2015-05-25 DIAGNOSIS — Z955 Presence of coronary angioplasty implant and graft: Secondary | ICD-10-CM | POA: Diagnosis not present

## 2015-05-25 DIAGNOSIS — I2510 Atherosclerotic heart disease of native coronary artery without angina pectoris: Secondary | ICD-10-CM | POA: Diagnosis not present

## 2015-05-28 ENCOUNTER — Encounter (HOSPITAL_COMMUNITY)
Admission: RE | Admit: 2015-05-28 | Discharge: 2015-05-28 | Disposition: A | Discharge status: Home / Self Care | Payer: Commercial Managed Care - HMO | Source: Ambulatory Visit | Attending: Cardiology | Admitting: Cardiology

## 2015-05-28 DIAGNOSIS — I2510 Atherosclerotic heart disease of native coronary artery without angina pectoris: Secondary | ICD-10-CM | POA: Diagnosis not present

## 2015-05-28 DIAGNOSIS — Z955 Presence of coronary angioplasty implant and graft: Secondary | ICD-10-CM | POA: Diagnosis not present

## 2015-05-30 ENCOUNTER — Encounter (HOSPITAL_COMMUNITY)
Admission: RE | Admit: 2015-05-30 | Discharge: 2015-05-30 | Disposition: A | Discharge status: Home / Self Care | Payer: Commercial Managed Care - HMO | Source: Ambulatory Visit | Attending: Cardiology | Admitting: Cardiology

## 2015-05-30 DIAGNOSIS — Z955 Presence of coronary angioplasty implant and graft: Secondary | ICD-10-CM | POA: Diagnosis not present

## 2015-05-30 DIAGNOSIS — I2510 Atherosclerotic heart disease of native coronary artery without angina pectoris: Secondary | ICD-10-CM | POA: Diagnosis not present

## 2015-06-01 ENCOUNTER — Encounter (HOSPITAL_COMMUNITY)
Admission: RE | Admit: 2015-06-01 | Discharge: 2015-06-01 | Disposition: A | Discharge status: Home / Self Care | Payer: Commercial Managed Care - HMO | Source: Ambulatory Visit | Attending: Cardiology | Admitting: Cardiology

## 2015-06-01 DIAGNOSIS — Z955 Presence of coronary angioplasty implant and graft: Secondary | ICD-10-CM | POA: Diagnosis not present

## 2015-06-01 DIAGNOSIS — I2510 Atherosclerotic heart disease of native coronary artery without angina pectoris: Secondary | ICD-10-CM | POA: Diagnosis not present

## 2015-06-04 ENCOUNTER — Encounter (HOSPITAL_COMMUNITY)
Admission: RE | Admit: 2015-06-04 | Discharge: 2015-06-04 | Disposition: A | Discharge status: Home / Self Care | Payer: Commercial Managed Care - HMO | Source: Ambulatory Visit | Attending: Cardiology | Admitting: Cardiology

## 2015-06-04 DIAGNOSIS — I2510 Atherosclerotic heart disease of native coronary artery without angina pectoris: Secondary | ICD-10-CM | POA: Diagnosis not present

## 2015-06-04 DIAGNOSIS — Z955 Presence of coronary angioplasty implant and graft: Secondary | ICD-10-CM | POA: Diagnosis not present

## 2015-06-06 ENCOUNTER — Encounter (HOSPITAL_COMMUNITY)
Admission: RE | Admit: 2015-06-06 | Discharge: 2015-06-06 | Disposition: A | Discharge status: Home / Self Care | Payer: Commercial Managed Care - HMO | Source: Ambulatory Visit | Attending: Cardiology | Admitting: Cardiology

## 2015-06-06 DIAGNOSIS — I2510 Atherosclerotic heart disease of native coronary artery without angina pectoris: Secondary | ICD-10-CM | POA: Diagnosis not present

## 2015-06-06 DIAGNOSIS — Z955 Presence of coronary angioplasty implant and graft: Secondary | ICD-10-CM | POA: Diagnosis not present

## 2015-06-08 ENCOUNTER — Encounter (HOSPITAL_COMMUNITY)
Admission: RE | Admit: 2015-06-08 | Discharge: 2015-06-08 | Disposition: A | Discharge status: Home / Self Care | Payer: Commercial Managed Care - HMO | Source: Ambulatory Visit | Attending: Cardiology | Admitting: Cardiology

## 2015-06-08 DIAGNOSIS — Z955 Presence of coronary angioplasty implant and graft: Secondary | ICD-10-CM | POA: Diagnosis not present

## 2015-06-08 DIAGNOSIS — I2510 Atherosclerotic heart disease of native coronary artery without angina pectoris: Secondary | ICD-10-CM | POA: Diagnosis not present

## 2015-06-11 ENCOUNTER — Encounter (HOSPITAL_COMMUNITY): Payer: Commercial Managed Care - HMO

## 2015-06-13 ENCOUNTER — Encounter (HOSPITAL_COMMUNITY)
Admission: RE | Admit: 2015-06-13 | Discharge: 2015-06-13 | Disposition: A | Discharge status: Home / Self Care | Payer: Commercial Managed Care - HMO | Source: Ambulatory Visit | Attending: Cardiology | Admitting: Cardiology

## 2015-06-13 DIAGNOSIS — I2510 Atherosclerotic heart disease of native coronary artery without angina pectoris: Secondary | ICD-10-CM | POA: Diagnosis not present

## 2015-06-13 DIAGNOSIS — Z955 Presence of coronary angioplasty implant and graft: Secondary | ICD-10-CM | POA: Diagnosis not present

## 2015-06-13 DIAGNOSIS — G4733 Obstructive sleep apnea (adult) (pediatric): Secondary | ICD-10-CM | POA: Diagnosis not present

## 2015-06-15 ENCOUNTER — Encounter (HOSPITAL_COMMUNITY): Payer: Commercial Managed Care - HMO

## 2015-06-18 ENCOUNTER — Encounter (HOSPITAL_COMMUNITY): Payer: Medicare HMO

## 2015-06-18 DIAGNOSIS — G4733 Obstructive sleep apnea (adult) (pediatric): Secondary | ICD-10-CM | POA: Diagnosis not present

## 2015-06-20 ENCOUNTER — Encounter (HOSPITAL_COMMUNITY)
Admission: RE | Admit: 2015-06-20 | Discharge: 2015-06-20 | Disposition: A | Discharge status: Home / Self Care | Payer: Medicare HMO | Source: Ambulatory Visit | Attending: Cardiology | Admitting: Cardiology

## 2015-06-20 DIAGNOSIS — I2510 Atherosclerotic heart disease of native coronary artery without angina pectoris: Secondary | ICD-10-CM | POA: Insufficient documentation

## 2015-06-20 DIAGNOSIS — Z955 Presence of coronary angioplasty implant and graft: Secondary | ICD-10-CM | POA: Diagnosis not present

## 2015-06-22 ENCOUNTER — Encounter (HOSPITAL_COMMUNITY)
Admission: RE | Admit: 2015-06-22 | Discharge: 2015-06-22 | Disposition: A | Discharge status: Home / Self Care | Payer: Medicare HMO | Source: Ambulatory Visit | Attending: Cardiology | Admitting: Cardiology

## 2015-06-22 DIAGNOSIS — Z955 Presence of coronary angioplasty implant and graft: Secondary | ICD-10-CM | POA: Diagnosis not present

## 2015-06-22 DIAGNOSIS — I2510 Atherosclerotic heart disease of native coronary artery without angina pectoris: Secondary | ICD-10-CM | POA: Diagnosis not present

## 2015-06-25 ENCOUNTER — Encounter (HOSPITAL_COMMUNITY): Payer: Medicare HMO

## 2015-06-27 ENCOUNTER — Encounter (HOSPITAL_COMMUNITY)
Admission: RE | Admit: 2015-06-27 | Discharge: 2015-06-27 | Disposition: A | Discharge status: Home / Self Care | Payer: Medicare HMO | Source: Ambulatory Visit | Attending: Cardiology | Admitting: Cardiology

## 2015-06-27 ENCOUNTER — Encounter (HOSPITAL_COMMUNITY): Payer: Medicare HMO

## 2015-06-27 DIAGNOSIS — I2510 Atherosclerotic heart disease of native coronary artery without angina pectoris: Secondary | ICD-10-CM | POA: Diagnosis not present

## 2015-06-27 DIAGNOSIS — Z955 Presence of coronary angioplasty implant and graft: Secondary | ICD-10-CM | POA: Diagnosis not present

## 2015-06-29 ENCOUNTER — Encounter (HOSPITAL_COMMUNITY)
Admission: RE | Admit: 2015-06-29 | Discharge: 2015-06-29 | Disposition: A | Discharge status: Home / Self Care | Payer: Medicare HMO | Source: Ambulatory Visit | Attending: Cardiology | Admitting: Cardiology

## 2015-06-29 DIAGNOSIS — I2510 Atherosclerotic heart disease of native coronary artery without angina pectoris: Secondary | ICD-10-CM | POA: Diagnosis not present

## 2015-06-29 DIAGNOSIS — Z955 Presence of coronary angioplasty implant and graft: Secondary | ICD-10-CM | POA: Diagnosis not present

## 2015-07-02 ENCOUNTER — Encounter (HOSPITAL_COMMUNITY)
Admission: RE | Admit: 2015-07-02 | Discharge: 2015-07-02 | Disposition: A | Discharge status: Home / Self Care | Payer: Medicare HMO | Source: Ambulatory Visit | Attending: Cardiology | Admitting: Cardiology

## 2015-07-02 DIAGNOSIS — I2510 Atherosclerotic heart disease of native coronary artery without angina pectoris: Secondary | ICD-10-CM | POA: Diagnosis not present

## 2015-07-02 DIAGNOSIS — Z955 Presence of coronary angioplasty implant and graft: Secondary | ICD-10-CM | POA: Diagnosis not present

## 2015-07-04 ENCOUNTER — Encounter (HOSPITAL_COMMUNITY)
Admission: RE | Admit: 2015-07-04 | Discharge: 2015-07-04 | Disposition: A | Discharge status: Home / Self Care | Payer: Medicare HMO | Source: Ambulatory Visit | Attending: Cardiology | Admitting: Cardiology

## 2015-07-04 DIAGNOSIS — I2510 Atherosclerotic heart disease of native coronary artery without angina pectoris: Secondary | ICD-10-CM | POA: Diagnosis not present

## 2015-07-04 DIAGNOSIS — Z955 Presence of coronary angioplasty implant and graft: Secondary | ICD-10-CM | POA: Diagnosis not present

## 2015-07-06 ENCOUNTER — Encounter (HOSPITAL_COMMUNITY)
Admission: RE | Admit: 2015-07-06 | Discharge: 2015-07-06 | Disposition: A | Discharge status: Home / Self Care | Payer: Medicare HMO | Source: Ambulatory Visit | Attending: Cardiology | Admitting: Cardiology

## 2015-07-06 DIAGNOSIS — Z955 Presence of coronary angioplasty implant and graft: Secondary | ICD-10-CM | POA: Diagnosis not present

## 2015-07-06 DIAGNOSIS — I2510 Atherosclerotic heart disease of native coronary artery without angina pectoris: Secondary | ICD-10-CM | POA: Diagnosis not present

## 2015-07-09 ENCOUNTER — Encounter (HOSPITAL_COMMUNITY)
Admission: RE | Admit: 2015-07-09 | Discharge: 2015-07-09 | Disposition: A | Discharge status: Home / Self Care | Payer: Medicare HMO | Source: Ambulatory Visit | Attending: Cardiology | Admitting: Cardiology

## 2015-07-09 DIAGNOSIS — I2510 Atherosclerotic heart disease of native coronary artery without angina pectoris: Secondary | ICD-10-CM | POA: Diagnosis not present

## 2015-07-09 DIAGNOSIS — Z955 Presence of coronary angioplasty implant and graft: Secondary | ICD-10-CM | POA: Diagnosis not present

## 2015-07-11 ENCOUNTER — Encounter (HOSPITAL_COMMUNITY)
Admission: RE | Admit: 2015-07-11 | Discharge: 2015-07-11 | Disposition: A | Discharge status: Home / Self Care | Payer: Medicare HMO | Source: Ambulatory Visit | Attending: Cardiology | Admitting: Cardiology

## 2015-07-11 DIAGNOSIS — I2510 Atherosclerotic heart disease of native coronary artery without angina pectoris: Secondary | ICD-10-CM | POA: Diagnosis not present

## 2015-07-11 DIAGNOSIS — Z955 Presence of coronary angioplasty implant and graft: Secondary | ICD-10-CM | POA: Diagnosis not present

## 2015-07-13 ENCOUNTER — Encounter (HOSPITAL_COMMUNITY): Payer: Medicare HMO

## 2015-07-18 NOTE — Progress Notes (Signed)
Cardiac Rehabilitation Program Outcomes Report   Orientation:  03/27/15 Graduate Date:  07/11/15 Discharge Date:  07/11/15 # of sessions completed: 36  Cardiologist: Rosalita Chessman MD:  Aundra Millet Time:  0930  A.  Exercise Program:  Tolerates exercise @ 5.60 METS for 15 minutes, Walk Test Results:  Post: 2.27, Improved functional capacity  9.09 % and Decreased  muscular strength  -7.14 %  B.  Mental Health:  Good mental attitude, Quality of Life (QOL)  changes:  Overall  -8.07 %, Health/Functioning -7.04 %, Socioeconomics -16.67 %, Psych/Spiritual -14.29 %, Family 15 %   and PHQ-9: 0  C.  Education/Instruction/Skills  Accurately checks own pulse.  Rest:  52  Exercise:  78, Knows THR for exercise and Attended 13 education classes  Uses Perceived Exertion Scale and/or Dyspnea Scale  D.  Nutrition/Weight Control/Body Composition:  Adherence to prescribed nutrition program: fair  and Patient has lost 3.2 kg   E.  Blood Lipids    Lab Results  Component Value Date   CHOL 123 02/08/2015   HDL 28* 02/08/2015   LDLCALC 59 02/08/2015   TRIG 178* 02/08/2015   CHOLHDL 4.4 02/08/2015    F.  Lifestyle Changes:  Making positive lifestyle changes and Not smoking:  Quit 1985  G.  Symptoms noted with exercise:  Asymptomatic  Report Completed By:  Stevphen Rochester RN   Comments:  This is the patients graduation note for [facility].  Patient progressed well in the program.  Patient plans to join the [facility] for follow up exercise.

## 2015-07-18 NOTE — Progress Notes (Signed)
Patient is discharged from [place] and Pulmonary program today, 07/11/15 with 36 sessions.  He achieved LTG of 30 minutes of aerobic exercise at max met level of 5.60.  Patient has not met with dietician.  Discharge instructions have been reviewed in detail and patient expressed an understanding of material given.  Patient plans to exercise at home and possibly join the [facility]. Cardiac Rehab will make 1 month, 6 month and 1 year call backs.  Patient had no complaints of any abnormal S/S or pain on their exit visit.  Patient finished post walk test. Exit PHQ 9 is 0.

## 2015-07-19 DIAGNOSIS — G4733 Obstructive sleep apnea (adult) (pediatric): Secondary | ICD-10-CM | POA: Diagnosis not present

## 2015-07-26 ENCOUNTER — Ambulatory Visit: Payer: Commercial Managed Care - HMO | Admitting: Pulmonary Disease

## 2015-07-31 ENCOUNTER — Other Ambulatory Visit: Payer: Self-pay | Admitting: Family Medicine

## 2015-07-31 ENCOUNTER — Ambulatory Visit
Admission: RE | Admit: 2015-07-31 | Discharge: 2015-07-31 | Disposition: A | Discharge status: Home / Self Care | Payer: Commercial Managed Care - HMO | Source: Ambulatory Visit | Attending: Family Medicine | Admitting: Family Medicine

## 2015-07-31 DIAGNOSIS — S93402A Sprain of unspecified ligament of left ankle, initial encounter: Secondary | ICD-10-CM

## 2015-07-31 DIAGNOSIS — M7989 Other specified soft tissue disorders: Secondary | ICD-10-CM | POA: Diagnosis not present

## 2015-08-03 ENCOUNTER — Encounter: Payer: Self-pay | Admitting: Pulmonary Disease

## 2015-08-03 ENCOUNTER — Ambulatory Visit (INDEPENDENT_AMBULATORY_CARE_PROVIDER_SITE_OTHER): Payer: Commercial Managed Care - HMO | Admitting: Pulmonary Disease

## 2015-08-03 VITALS — BP 150/88 | HR 67 | Ht 74.0 in | Wt 297.0 lb

## 2015-08-03 DIAGNOSIS — G4733 Obstructive sleep apnea (adult) (pediatric): Secondary | ICD-10-CM

## 2015-08-03 NOTE — Patient Instructions (Signed)
Follow up in 1 year.

## 2015-08-03 NOTE — Progress Notes (Signed)
Current Outpatient Prescriptions on File Prior to Visit  Medication Sig  . acetaminophen (TYLENOL) 500 MG tablet Take 500 mg by mouth every 6 (six) hours as needed for moderate pain.  Marland Kitchen allopurinol (ZYLOPRIM) 300 MG tablet Take 300 mg by mouth Daily.   Marland Kitchen amLODipine (NORVASC) 10 MG tablet Take 10 mg by mouth Daily.   Marland Kitchen aspirin EC 81 MG EC tablet Take 1 tablet (81 mg total) by mouth daily.  . diphenhydrAMINE (BENADRYL) 25 mg capsule Take 50-75 mg by mouth daily as needed (for hayfever).  . hydrochlorothiazide (HYDRODIURIL) 25 MG tablet Take 25 mg by mouth daily.  Marland Kitchen losartan (COZAAR) 100 MG tablet Take 100 mg by mouth daily.  . nitroGLYCERIN (NITROSTAT) 0.4 MG SL tablet Place 1 tablet (0.4 mg total) under the tongue every 5 (five) minutes x 3 doses as needed for chest pain.  Marland Kitchen omeprazole (PRILOSEC) 20 MG capsule Take 20 mg by mouth daily.  . ticagrelor (BRILINTA) 90 MG TABS tablet Take 1 tablet (90 mg total) by mouth 2 (two) times daily.   No current facility-administered medications on file prior to visit.     Chief Complaint  Patient presents with  . Follow-up    Wears CPAP most nights. Pt reports not wearing it 100% in the last 90 days. Denies any problems with mask/pressure. Needs supplies. DME: [employer]     Tests PSG 04/07/14 >> AHI 97 BiPAP 04/26/15 to 07/24/15 >> used on 46 of 90 nights with average 11 hrs 4 min.  Average AHI 9.8 with BiPAP 12/8 cm H2O  Past medical hx CAD s/p stent, HTN, HLD, PUD, Gout, Prostate cancer, PNA, GERD, PUD, Nephrolithiasis, Blind Lt eye  Past surgical hx, Allergies, Family hx, Social hx all reviewed.  Vital Signs BP 150/88 mmHg  Pulse 67  Ht 6\' 2"  (1.88 m)  Wt 297 lb (134.718 kg)  BMI 38.12 kg/m2  SpO2 96%  History of Present Illness Lissandro Crocker Gearing is a 70 y.o. male with OSA.  He has been using BiPAP.  He has full face mask >> no issues with mask fit.  This helps his sleep.  He had to loan his machine to his wife for the past month because  her's broke >> he has started using again.  Physical Exam  General - No distress ENT - No sinus tenderness, no oral exudate, no LAN, MP 3, elongated uvula, 2+ tonsils Cardiac - s1s2 regular, no murmur Chest - No wheeze/rales/dullness Back - No focal tenderness Abd - Soft, non-tender Ext - No edema Neuro - Normal strength Skin - No rashes Psych - normal mood, and behavior   Assessment/Plan  Obstructive sleep apnea. Plan: - continue BiPAP 12/8 cm H2O    Patient Instructions  Follow up in 1 year     Chesley Mires, MD [place] Pager:  [phone]

## 2015-08-10 DIAGNOSIS — N402 Nodular prostate without lower urinary tract symptoms: Secondary | ICD-10-CM | POA: Diagnosis not present

## 2015-08-10 DIAGNOSIS — C61 Malignant neoplasm of prostate: Secondary | ICD-10-CM | POA: Diagnosis not present

## 2015-08-16 DIAGNOSIS — Z0000 Encounter for general adult medical examination without abnormal findings: Secondary | ICD-10-CM | POA: Diagnosis not present

## 2015-08-16 DIAGNOSIS — N402 Nodular prostate without lower urinary tract symptoms: Secondary | ICD-10-CM | POA: Diagnosis not present

## 2015-08-16 DIAGNOSIS — C61 Malignant neoplasm of prostate: Secondary | ICD-10-CM | POA: Diagnosis not present

## 2015-08-16 DIAGNOSIS — G4733 Obstructive sleep apnea (adult) (pediatric): Secondary | ICD-10-CM | POA: Diagnosis not present

## 2015-08-17 DIAGNOSIS — E668 Other obesity: Secondary | ICD-10-CM | POA: Diagnosis not present

## 2015-08-17 DIAGNOSIS — R079 Chest pain, unspecified: Secondary | ICD-10-CM | POA: Diagnosis not present

## 2015-08-17 DIAGNOSIS — Z79899 Other long term (current) drug therapy: Secondary | ICD-10-CM | POA: Diagnosis not present

## 2015-08-17 DIAGNOSIS — E785 Hyperlipidemia, unspecified: Secondary | ICD-10-CM | POA: Diagnosis not present

## 2015-08-17 DIAGNOSIS — I119 Hypertensive heart disease without heart failure: Secondary | ICD-10-CM | POA: Diagnosis not present

## 2015-08-17 DIAGNOSIS — I441 Atrioventricular block, second degree: Secondary | ICD-10-CM | POA: Diagnosis not present

## 2015-08-17 DIAGNOSIS — G4733 Obstructive sleep apnea (adult) (pediatric): Secondary | ICD-10-CM | POA: Diagnosis not present

## 2015-08-17 DIAGNOSIS — Z8711 Personal history of peptic ulcer disease: Secondary | ICD-10-CM | POA: Diagnosis not present

## 2015-08-17 DIAGNOSIS — I2510 Atherosclerotic heart disease of native coronary artery without angina pectoris: Secondary | ICD-10-CM | POA: Diagnosis not present

## 2015-09-16 DIAGNOSIS — G4733 Obstructive sleep apnea (adult) (pediatric): Secondary | ICD-10-CM | POA: Diagnosis not present

## 2015-10-16 DIAGNOSIS — G4733 Obstructive sleep apnea (adult) (pediatric): Secondary | ICD-10-CM | POA: Diagnosis not present

## 2015-11-16 DIAGNOSIS — G4733 Obstructive sleep apnea (adult) (pediatric): Secondary | ICD-10-CM | POA: Diagnosis not present

## 2015-12-16 DIAGNOSIS — G4733 Obstructive sleep apnea (adult) (pediatric): Secondary | ICD-10-CM | POA: Diagnosis not present

## 2015-12-26 DIAGNOSIS — H43812 Vitreous degeneration, left eye: Secondary | ICD-10-CM | POA: Diagnosis not present

## 2015-12-26 DIAGNOSIS — H4052X3 Glaucoma secondary to other eye disorders, left eye, severe stage: Secondary | ICD-10-CM | POA: Diagnosis not present

## 2015-12-26 DIAGNOSIS — H4720 Unspecified optic atrophy: Secondary | ICD-10-CM | POA: Diagnosis not present

## 2015-12-26 DIAGNOSIS — H40032 Anatomical narrow angle, left eye: Secondary | ICD-10-CM | POA: Diagnosis not present

## 2015-12-26 DIAGNOSIS — R69 Illness, unspecified: Secondary | ICD-10-CM | POA: Diagnosis not present

## 2016-01-16 DIAGNOSIS — G4733 Obstructive sleep apnea (adult) (pediatric): Secondary | ICD-10-CM | POA: Diagnosis not present

## 2016-02-05 DIAGNOSIS — I10 Essential (primary) hypertension: Secondary | ICD-10-CM | POA: Diagnosis not present

## 2016-02-05 DIAGNOSIS — M109 Gout, unspecified: Secondary | ICD-10-CM | POA: Diagnosis not present

## 2016-02-05 DIAGNOSIS — K219 Gastro-esophageal reflux disease without esophagitis: Secondary | ICD-10-CM | POA: Diagnosis not present

## 2016-02-05 DIAGNOSIS — Z23 Encounter for immunization: Secondary | ICD-10-CM | POA: Diagnosis not present

## 2016-02-05 DIAGNOSIS — Z0000 Encounter for general adult medical examination without abnormal findings: Secondary | ICD-10-CM | POA: Diagnosis not present

## 2016-02-05 DIAGNOSIS — C61 Malignant neoplasm of prostate: Secondary | ICD-10-CM | POA: Diagnosis not present

## 2016-02-05 DIAGNOSIS — I2510 Atherosclerotic heart disease of native coronary artery without angina pectoris: Secondary | ICD-10-CM | POA: Diagnosis not present

## 2016-02-05 DIAGNOSIS — E7800 Pure hypercholesterolemia, unspecified: Secondary | ICD-10-CM | POA: Diagnosis not present

## 2016-02-16 DIAGNOSIS — G4733 Obstructive sleep apnea (adult) (pediatric): Secondary | ICD-10-CM | POA: Diagnosis not present

## 2016-02-21 DIAGNOSIS — C61 Malignant neoplasm of prostate: Secondary | ICD-10-CM | POA: Diagnosis not present

## 2016-02-26 DIAGNOSIS — N183 Chronic kidney disease, stage 3 (moderate): Secondary | ICD-10-CM | POA: Diagnosis not present

## 2016-03-11 DIAGNOSIS — N402 Nodular prostate without lower urinary tract symptoms: Secondary | ICD-10-CM | POA: Diagnosis not present

## 2016-03-11 DIAGNOSIS — C61 Malignant neoplasm of prostate: Secondary | ICD-10-CM | POA: Diagnosis not present

## 2016-03-17 DIAGNOSIS — G4733 Obstructive sleep apnea (adult) (pediatric): Secondary | ICD-10-CM | POA: Diagnosis not present

## 2016-03-25 DIAGNOSIS — Z79899 Other long term (current) drug therapy: Secondary | ICD-10-CM | POA: Diagnosis not present

## 2016-03-25 DIAGNOSIS — I2510 Atherosclerotic heart disease of native coronary artery without angina pectoris: Secondary | ICD-10-CM | POA: Diagnosis not present

## 2016-03-25 DIAGNOSIS — I441 Atrioventricular block, second degree: Secondary | ICD-10-CM | POA: Diagnosis not present

## 2016-03-25 DIAGNOSIS — E785 Hyperlipidemia, unspecified: Secondary | ICD-10-CM | POA: Diagnosis not present

## 2016-03-25 DIAGNOSIS — E668 Other obesity: Secondary | ICD-10-CM | POA: Diagnosis not present

## 2016-03-25 DIAGNOSIS — Z8711 Personal history of peptic ulcer disease: Secondary | ICD-10-CM | POA: Diagnosis not present

## 2016-03-25 DIAGNOSIS — G4733 Obstructive sleep apnea (adult) (pediatric): Secondary | ICD-10-CM | POA: Diagnosis not present

## 2016-03-25 DIAGNOSIS — Z955 Presence of coronary angioplasty implant and graft: Secondary | ICD-10-CM | POA: Diagnosis not present

## 2016-03-25 DIAGNOSIS — I119 Hypertensive heart disease without heart failure: Secondary | ICD-10-CM | POA: Diagnosis not present

## 2016-04-17 DIAGNOSIS — G4733 Obstructive sleep apnea (adult) (pediatric): Secondary | ICD-10-CM | POA: Diagnosis not present

## 2016-05-17 DIAGNOSIS — G4733 Obstructive sleep apnea (adult) (pediatric): Secondary | ICD-10-CM | POA: Diagnosis not present

## 2016-06-17 DIAGNOSIS — G4733 Obstructive sleep apnea (adult) (pediatric): Secondary | ICD-10-CM | POA: Diagnosis not present

## 2016-07-10 DIAGNOSIS — D2310 Other benign neoplasm of skin of unspecified eyelid, including canthus: Secondary | ICD-10-CM | POA: Diagnosis not present

## 2016-07-10 DIAGNOSIS — H40032 Anatomical narrow angle, left eye: Secondary | ICD-10-CM | POA: Diagnosis not present

## 2016-07-10 DIAGNOSIS — H4720 Unspecified optic atrophy: Secondary | ICD-10-CM | POA: Diagnosis not present

## 2016-07-10 DIAGNOSIS — H4052X3 Glaucoma secondary to other eye disorders, left eye, severe stage: Secondary | ICD-10-CM | POA: Diagnosis not present

## 2016-07-18 DIAGNOSIS — G4733 Obstructive sleep apnea (adult) (pediatric): Secondary | ICD-10-CM | POA: Diagnosis not present

## 2016-08-07 DIAGNOSIS — Z6841 Body Mass Index (BMI) 40.0 and over, adult: Secondary | ICD-10-CM | POA: Diagnosis not present

## 2016-08-07 DIAGNOSIS — I10 Essential (primary) hypertension: Secondary | ICD-10-CM | POA: Diagnosis not present

## 2016-08-07 DIAGNOSIS — M25561 Pain in right knee: Secondary | ICD-10-CM | POA: Diagnosis not present

## 2016-08-07 DIAGNOSIS — N183 Chronic kidney disease, stage 3 (moderate): Secondary | ICD-10-CM | POA: Diagnosis not present

## 2016-08-07 DIAGNOSIS — M25562 Pain in left knee: Secondary | ICD-10-CM | POA: Diagnosis not present

## 2016-08-07 DIAGNOSIS — E7800 Pure hypercholesterolemia, unspecified: Secondary | ICD-10-CM | POA: Diagnosis not present

## 2016-08-15 DIAGNOSIS — G4733 Obstructive sleep apnea (adult) (pediatric): Secondary | ICD-10-CM | POA: Diagnosis not present

## 2016-09-15 DIAGNOSIS — G4733 Obstructive sleep apnea (adult) (pediatric): Secondary | ICD-10-CM | POA: Diagnosis not present

## 2016-09-29 DIAGNOSIS — G4733 Obstructive sleep apnea (adult) (pediatric): Secondary | ICD-10-CM | POA: Diagnosis not present

## 2016-09-29 DIAGNOSIS — Z8711 Personal history of peptic ulcer disease: Secondary | ICD-10-CM | POA: Diagnosis not present

## 2016-09-29 DIAGNOSIS — I119 Hypertensive heart disease without heart failure: Secondary | ICD-10-CM | POA: Diagnosis not present

## 2016-09-29 DIAGNOSIS — I2510 Atherosclerotic heart disease of native coronary artery without angina pectoris: Secondary | ICD-10-CM | POA: Diagnosis not present

## 2016-09-29 DIAGNOSIS — Z79899 Other long term (current) drug therapy: Secondary | ICD-10-CM | POA: Diagnosis not present

## 2016-09-29 DIAGNOSIS — E785 Hyperlipidemia, unspecified: Secondary | ICD-10-CM | POA: Diagnosis not present

## 2016-09-29 DIAGNOSIS — I441 Atrioventricular block, second degree: Secondary | ICD-10-CM | POA: Diagnosis not present

## 2016-09-29 DIAGNOSIS — E668 Other obesity: Secondary | ICD-10-CM | POA: Diagnosis not present

## 2016-09-29 DIAGNOSIS — R079 Chest pain, unspecified: Secondary | ICD-10-CM | POA: Diagnosis not present

## 2016-10-03 DIAGNOSIS — E782 Mixed hyperlipidemia: Secondary | ICD-10-CM | POA: Diagnosis not present

## 2016-10-03 DIAGNOSIS — C61 Malignant neoplasm of prostate: Secondary | ICD-10-CM | POA: Diagnosis not present

## 2016-10-10 DIAGNOSIS — N402 Nodular prostate without lower urinary tract symptoms: Secondary | ICD-10-CM | POA: Diagnosis not present

## 2016-10-10 DIAGNOSIS — C61 Malignant neoplasm of prostate: Secondary | ICD-10-CM | POA: Diagnosis not present

## 2016-10-15 DIAGNOSIS — G4733 Obstructive sleep apnea (adult) (pediatric): Secondary | ICD-10-CM | POA: Diagnosis not present

## 2016-11-13 DIAGNOSIS — G4733 Obstructive sleep apnea (adult) (pediatric): Secondary | ICD-10-CM | POA: Diagnosis not present

## 2016-11-15 DIAGNOSIS — G4733 Obstructive sleep apnea (adult) (pediatric): Secondary | ICD-10-CM | POA: Diagnosis not present

## 2016-11-19 ENCOUNTER — Other Ambulatory Visit: Payer: Self-pay | Admitting: Physician Assistant

## 2016-11-19 ENCOUNTER — Ambulatory Visit
Admission: RE | Admit: 2016-11-19 | Discharge: 2016-11-19 | Disposition: A | Discharge status: Home / Self Care | Payer: Medicare HMO | Source: Ambulatory Visit | Attending: Physician Assistant | Admitting: Physician Assistant

## 2016-11-19 DIAGNOSIS — M25561 Pain in right knee: Secondary | ICD-10-CM | POA: Diagnosis not present

## 2016-11-19 DIAGNOSIS — M7989 Other specified soft tissue disorders: Secondary | ICD-10-CM | POA: Diagnosis not present

## 2016-12-04 DIAGNOSIS — M1712 Unilateral primary osteoarthritis, left knee: Secondary | ICD-10-CM | POA: Diagnosis not present

## 2016-12-04 DIAGNOSIS — M25561 Pain in right knee: Secondary | ICD-10-CM | POA: Diagnosis not present

## 2016-12-04 DIAGNOSIS — M25562 Pain in left knee: Secondary | ICD-10-CM | POA: Diagnosis not present

## 2016-12-04 DIAGNOSIS — M1711 Unilateral primary osteoarthritis, right knee: Secondary | ICD-10-CM | POA: Diagnosis not present

## 2016-12-15 DIAGNOSIS — G4733 Obstructive sleep apnea (adult) (pediatric): Secondary | ICD-10-CM | POA: Diagnosis not present

## 2016-12-25 DIAGNOSIS — H5200 Hypermetropia, unspecified eye: Secondary | ICD-10-CM | POA: Diagnosis not present

## 2016-12-29 DIAGNOSIS — M1711 Unilateral primary osteoarthritis, right knee: Secondary | ICD-10-CM | POA: Diagnosis not present

## 2016-12-29 DIAGNOSIS — M25562 Pain in left knee: Secondary | ICD-10-CM | POA: Diagnosis not present

## 2016-12-29 DIAGNOSIS — M25561 Pain in right knee: Secondary | ICD-10-CM | POA: Diagnosis not present

## 2016-12-29 DIAGNOSIS — M1712 Unilateral primary osteoarthritis, left knee: Secondary | ICD-10-CM | POA: Diagnosis not present

## 2017-01-05 DIAGNOSIS — M1711 Unilateral primary osteoarthritis, right knee: Secondary | ICD-10-CM | POA: Diagnosis not present

## 2017-01-12 DIAGNOSIS — M1711 Unilateral primary osteoarthritis, right knee: Secondary | ICD-10-CM | POA: Diagnosis not present

## 2017-01-15 DIAGNOSIS — G4733 Obstructive sleep apnea (adult) (pediatric): Secondary | ICD-10-CM | POA: Diagnosis not present

## 2017-01-20 DIAGNOSIS — M1711 Unilateral primary osteoarthritis, right knee: Secondary | ICD-10-CM | POA: Diagnosis not present

## 2017-02-13 DIAGNOSIS — G4733 Obstructive sleep apnea (adult) (pediatric): Secondary | ICD-10-CM | POA: Diagnosis not present

## 2017-02-15 DIAGNOSIS — G4733 Obstructive sleep apnea (adult) (pediatric): Secondary | ICD-10-CM | POA: Diagnosis not present

## 2017-02-19 DIAGNOSIS — M1711 Unilateral primary osteoarthritis, right knee: Secondary | ICD-10-CM | POA: Diagnosis not present

## 2017-02-23 DIAGNOSIS — Z23 Encounter for immunization: Secondary | ICD-10-CM | POA: Diagnosis not present

## 2017-02-23 DIAGNOSIS — K219 Gastro-esophageal reflux disease without esophagitis: Secondary | ICD-10-CM | POA: Diagnosis not present

## 2017-02-23 DIAGNOSIS — Z0000 Encounter for general adult medical examination without abnormal findings: Secondary | ICD-10-CM | POA: Diagnosis not present

## 2017-02-23 DIAGNOSIS — Z1389 Encounter for screening for other disorder: Secondary | ICD-10-CM | POA: Diagnosis not present

## 2017-02-23 DIAGNOSIS — I129 Hypertensive chronic kidney disease with stage 1 through stage 4 chronic kidney disease, or unspecified chronic kidney disease: Secondary | ICD-10-CM | POA: Diagnosis not present

## 2017-02-23 DIAGNOSIS — E7800 Pure hypercholesterolemia, unspecified: Secondary | ICD-10-CM | POA: Diagnosis not present

## 2017-02-23 DIAGNOSIS — M109 Gout, unspecified: Secondary | ICD-10-CM | POA: Diagnosis not present

## 2017-02-23 DIAGNOSIS — I2510 Atherosclerotic heart disease of native coronary artery without angina pectoris: Secondary | ICD-10-CM | POA: Diagnosis not present

## 2017-02-23 DIAGNOSIS — H5442A3 Blindness left eye category 3, normal vision right eye: Secondary | ICD-10-CM | POA: Diagnosis not present

## 2017-03-12 DIAGNOSIS — E785 Hyperlipidemia, unspecified: Secondary | ICD-10-CM | POA: Diagnosis not present

## 2017-03-12 DIAGNOSIS — Z79899 Other long term (current) drug therapy: Secondary | ICD-10-CM | POA: Diagnosis not present

## 2017-03-12 DIAGNOSIS — I441 Atrioventricular block, second degree: Secondary | ICD-10-CM | POA: Diagnosis not present

## 2017-03-12 DIAGNOSIS — E668 Other obesity: Secondary | ICD-10-CM | POA: Diagnosis not present

## 2017-03-12 DIAGNOSIS — G4733 Obstructive sleep apnea (adult) (pediatric): Secondary | ICD-10-CM | POA: Diagnosis not present

## 2017-03-12 DIAGNOSIS — I2510 Atherosclerotic heart disease of native coronary artery without angina pectoris: Secondary | ICD-10-CM | POA: Diagnosis not present

## 2017-03-12 DIAGNOSIS — Z8711 Personal history of peptic ulcer disease: Secondary | ICD-10-CM | POA: Diagnosis not present

## 2017-03-12 DIAGNOSIS — I119 Hypertensive heart disease without heart failure: Secondary | ICD-10-CM | POA: Diagnosis not present

## 2017-03-12 DIAGNOSIS — Z01810 Encounter for preprocedural cardiovascular examination: Secondary | ICD-10-CM | POA: Diagnosis not present

## 2017-03-13 ENCOUNTER — Telehealth: Payer: Self-pay

## 2017-03-13 ENCOUNTER — Telehealth: Payer: Self-pay | Admitting: Pulmonary Disease

## 2017-03-13 ENCOUNTER — Other Ambulatory Visit: Payer: Self-pay

## 2017-03-13 ENCOUNTER — Encounter: Payer: Self-pay | Admitting: Pulmonary Disease

## 2017-03-13 NOTE — Telephone Encounter (Signed)
Error

## 2017-03-13 NOTE — Telephone Encounter (Signed)
Rec'd fax from [facility] on 03-05-2018 regarding need of pulmonary clearance so patient may be scheduled for colonoscopy at their facility. Patient doesn't have a ROV scheduled for this request at this time. Will speak to VS to see if ROV is needed for clearance at this time.  Left VM message for patient today to return call at earliest convenience.

## 2017-03-13 NOTE — Telephone Encounter (Signed)
Spoke with pt, he states he needs clearance from [place] Dr. Halford Chessman but he does not have any appts. I put him with TP in a 30 minute slot. Pt advised and nothing further is needed/

## 2017-03-16 ENCOUNTER — Telehealth: Payer: Self-pay

## 2017-03-16 ENCOUNTER — Other Ambulatory Visit: Payer: Self-pay

## 2017-03-16 NOTE — Telephone Encounter (Signed)
rec'd fax from [facility] physicians from [place] with Dr. Charlie Pitter, MD office requesting pulmonary clearance for patient to have a scheduled colonoscopy completed at their office. Rerouted the fax to VS for review.  Per Vs he signed the fax on 03-13-2017. Faxed over the fax back to [facility] [employer] office today.  Nothing further needed at this time for the pt.

## 2017-03-17 DIAGNOSIS — G4733 Obstructive sleep apnea (adult) (pediatric): Secondary | ICD-10-CM | POA: Diagnosis not present

## 2017-03-24 ENCOUNTER — Other Ambulatory Visit (INDEPENDENT_AMBULATORY_CARE_PROVIDER_SITE_OTHER): Payer: Medicare HMO

## 2017-03-24 ENCOUNTER — Encounter: Payer: Self-pay | Admitting: Adult Health

## 2017-03-24 ENCOUNTER — Ambulatory Visit (INDEPENDENT_AMBULATORY_CARE_PROVIDER_SITE_OTHER): Payer: Medicare HMO | Admitting: Adult Health

## 2017-03-24 ENCOUNTER — Ambulatory Visit (INDEPENDENT_AMBULATORY_CARE_PROVIDER_SITE_OTHER)
Admission: RE | Admit: 2017-03-24 | Discharge: 2017-03-24 | Disposition: A | Discharge status: Home / Self Care | Payer: Medicare HMO | Source: Ambulatory Visit | Attending: Adult Health | Admitting: Adult Health

## 2017-03-24 VITALS — BP 124/74 | HR 50 | Ht 74.0 in | Wt 313.6 lb

## 2017-03-24 DIAGNOSIS — G4733 Obstructive sleep apnea (adult) (pediatric): Secondary | ICD-10-CM

## 2017-03-24 DIAGNOSIS — R0602 Shortness of breath: Secondary | ICD-10-CM

## 2017-03-24 DIAGNOSIS — R0609 Other forms of dyspnea: Secondary | ICD-10-CM

## 2017-03-24 DIAGNOSIS — R05 Cough: Secondary | ICD-10-CM | POA: Diagnosis not present

## 2017-03-24 DIAGNOSIS — R0600 Dyspnea, unspecified: Secondary | ICD-10-CM | POA: Insufficient documentation

## 2017-03-24 LAB — BASIC METABOLIC PANEL
BUN: 20 mg/dL (ref 6–23)
CHLORIDE: 101 meq/L (ref 96–112)
CO2: 35 meq/L — AB (ref 19–32)
CREATININE: 1.25 mg/dL (ref 0.40–1.50)
Calcium: 10.5 mg/dL (ref 8.4–10.5)
GFR: 60.23 mL/min (ref >60.00)
Glucose, Bld: 98 mg/dL (ref 70–99)
POTASSIUM: 4.3 meq/L (ref 3.5–5.1)
Sodium: 141 mEq/L (ref 135–145)

## 2017-03-24 LAB — CBC WITH DIFFERENTIAL/PLATELET
Basophils Absolute: 0.1 10*3/uL (ref 0.0–0.1)
Basophils Relative: 0.8 % (ref 0.0–3.0)
EOS ABS: 0.4 10*3/uL (ref 0.0–0.7)
EOS PCT: 4.3 % (ref 0.0–5.0)
HEMATOCRIT: 48.8 % (ref 39.0–52.0)
HEMOGLOBIN: 16.1 g/dL (ref 13.0–17.0)
Lymphocytes Relative: 33.6 % (ref 12.0–46.0)
Lymphs Abs: 3.3 10*3/uL (ref 0.7–4.0)
MCHC: 33 g/dL (ref 30.0–36.0)
MCV: 91.3 fl (ref 78.0–100.0)
MONO ABS: 0.9 10*3/uL (ref 0.1–1.0)
Monocytes Relative: 8.7 % (ref 3.0–12.0)
NEUTROS ABS: 5.2 10*3/uL (ref 1.4–7.7)
Neutrophils Relative %: 52.6 % (ref 43.0–77.0)
PLATELETS: 205 10*3/uL (ref 150.0–400.0)
RBC: 5.35 Mil/uL (ref 4.22–5.81)
RDW: 13.8 % (ref 11.5–15.5)
WBC: 9.8 10*3/uL (ref 4.0–10.5)

## 2017-03-24 LAB — BRAIN NATRIURETIC PEPTIDE: PRO B NATRI PEPTIDE: 190 pg/mL — AB (ref 0.0–100.0)

## 2017-03-24 LAB — NITRIC OXIDE: Nitric Oxide: 7

## 2017-03-24 NOTE — Progress Notes (Signed)
@Patient  ID: [name], male    DOB: 26-Sep-1945, 71 y.o.   MRN: [id number]  Chief Complaint  Patient presents with  . Follow-up    OSA     Referring provider: Maury Dus, MD  HPI: 71 yo male former smoker followed for severe OSA on BIPAP At bedtime   PMH of CAD, HTN  Tests PSG 04/07/14 >> AHI 97 BiPAP 04/26/15 to 07/24/15 >> used on 46 of 90 nights with average 11 hrs 4 min.  Average AHI 9.8 with BiPAP 12/8 cm H2O  03/24/2017 Follow up ; OSA on BIPAP ,Dyspnea  Patient returns for a follow-up. Patient has known severe sleep apnea and is on BiPAP at bedtime. He was last seen in February 2017. Patient admits that he has not been wearing his BiPAP. He says he's tried aware to couple times but just does not feel that is comfortable. We discussed his severe sleep apnea and need to wear his BiPAP. Discussed potential complications of untreated sleep apnea. Encouraged him on compliance.  Patient complains of shortness of breath that has been chronic and feels that his begin getting worse over the last several months. Over the last 3-4 weeks. Symptoms have been getting more and more pronounced. Patient was seen recently at his gastroenterologist for a screening colonoscopy and due to his dyspnea was recommended to see pulmonology for evaluation prior to getting a colonoscopy. Patient says he has intermittent dry cough and occasional wheezing. Patient was seen in 2013 by Dr. Chase Caller for shortness of breath. During that evaluation, he was felt not to have COPD. Was felt that his dyspnea was related to obesity. Pt says he gets winded with walking . Wears out of breath. Unable to do yard work. No chest pain, calf pain or hemoptysis . Does have chronic ankle edema. No flare of swelling . On HCTZ daily.   He is followed by Cardiology for CAD s/p Stents . Says he was seen last week by cards . No records available.   FENO testing today 7ppb   No Known Allergies  Immunization History    Administered Date(s) Administered  . Influenza Whole 03/19/2009, 04/17/2010, 03/17/2011  . Influenza-Unspecified 03/16/2014, 02/07/2015  . Pneumococcal Polysaccharide-23 03/19/2009    Past Medical History:  Diagnosis Date  . Arthritis    "left shoulder" (02/09/2015)  . Blind left eye   . CAD (coronary artery disease)   . Complication of anesthesia    "they give me too much anesthesia & had to put me on life support for a little bit w/gallbladder OR"  . COPD (chronic obstructive pulmonary disease) ([place])   . Coronary artery disease   . GERD (gastroesophageal reflux disease)   . Gout   . History of bleeding ulcers   . History of gout   . History of peptic ulcer disease   . Hyperlipidemia   . Hypertension   . Hypertensive heart disease without CHF   . Kidney stones   . Lumbar disc disease   . Myocardial infarction [facility]) 1995; 2007  . Obesity   . OSA (obstructive sleep apnea)   . OSA on CPAP   . Pneumonia   . Pneumonia X 2  . Prostate cancer ([place])   . Pulmonary nodule   . Second degree Mobitz I AV block     Tobacco History: History  Smoking Status  . Former Smoker  . Packs/day: 3.00  . Years: 35.00  . Types: Cigarettes  . Quit date: 06/17/1983  Smokeless  Tobacco  . Never Used    Comment: "quit smoking cigarettes in 1985   Counseling given: Not Answered   Outpatient Encounter Prescriptions as of 03/24/2017  Medication Sig  . acetaminophen (TYLENOL) 500 MG tablet Take 500 mg by mouth every 6 (six) hours as needed for moderate pain.  Marland Kitchen allopurinol (ZYLOPRIM) 300 MG tablet Take 300 mg by mouth Daily.   Marland Kitchen amLODipine (NORVASC) 10 MG tablet Take 10 mg by mouth Daily.   Marland Kitchen aspirin EC 81 MG EC tablet Take 1 tablet (81 mg total) by mouth daily.  Marland Kitchen atorvastatin (LIPITOR) 20 MG tablet Take 20 mg by mouth every other day.  . diphenhydrAMINE (BENADRYL) 25 mg capsule Take 50-75 mg by mouth daily as needed (for hayfever).  . hydrochlorothiazide (HYDRODIURIL) 25 MG tablet Take 25  mg by mouth daily.  Marland Kitchen losartan (COZAAR) 100 MG tablet Take 100 mg by mouth daily.  . nitroGLYCERIN (NITROSTAT) 0.4 MG SL tablet Place 1 tablet (0.4 mg total) under the tongue every 5 (five) minutes x 3 doses as needed for chest pain.  Marland Kitchen omeprazole (PRILOSEC) 20 MG capsule Take 20 mg by mouth daily.  . ticagrelor (BRILINTA) 90 MG TABS tablet Take 1 tablet (90 mg total) by mouth 2 (two) times daily.   No facility-administered encounter medications on file as of 03/24/2017.      Review of Systems  Constitutional:   No  weight loss, night sweats,  Fevers, chills,  +fatigue, or  lassitude.  HEENT:   No headaches,  Difficulty swallowing,  Tooth/dental problems, or  Sore throat,                No sneezing, itching, ear ache, nasal congestion, post nasal drip,   CV:  No chest pain,  Orthopnea, PND,  anasarca, dizziness, palpitations, syncope.   GI  No heartburn, indigestion, abdominal pain, nausea, vomiting, diarrhea, change in bowel habits, loss of appetite, bloody stools.   Resp:    No chest wall deformity  Skin: no rash or lesions.  GU: no dysuria, change in color of urine, no urgency or frequency.  No flank pain, no hematuria   MS:  No joint pain or swelling.  No decreased range of motion.  No back pain.    Physical Exam  BP 124/74 (BP Location: Right Arm, Cuff Size: Large)   Pulse (!) 50   Ht 6\' 2"  (1.88 m)   Wt (!) 313 lb 9.6 oz (142.2 kg)   SpO2 97%   BMI 40.26 kg/m   GEN: A/Ox3; pleasant , NAD, obese , class 2-3 MP airway    HEENT:  [place]/AT,  EACs-clear, TMs-wnl, NOSE-clear, THROAT-clear, no lesions, no postnasal drip or exudate noted.   NECK:  Supple w/ fair ROM; no JVD; normal carotid impulses w/o bruits; no thyromegaly or nodules palpated; no lymphadenopathy.    RESP  Clear  P & A; w/o, wheezes/ rales/ or rhonchi. no accessory muscle use, no dullness to percussion  CARD:  RRR, no m/r/g, 1+ peripheral edema, pulses intact, no cyanosis or clubbing.  GI:   Soft & nt;  nml bowel sounds; no organomegaly or masses detected.   Musco: Warm bil, no deformities or joint swelling noted.   Neuro: alert, no focal deficits noted.    Skin: Warm, no lesions or rashes    Lab Results:   BNP No results found for: BNP  ProBNP Imaging: No results found.   Assessment & Plan:   OSA (obstructive sleep apnea) Non compliant  with BIPAP  Encouraged on compliance   Plan  Patient Instructions  Chest xray today .  Labs today .  Restart BIPAP At bedtime  .  Need to wear BIPAP .for at least 4hr each night.  Follow up with Dr. Chase Caller in 2 weeks with PFT and As needed   Please contact office for sooner follow up if symptoms do not improve or worsen or seek emergency care        Dyspnea DOE -Chronic with progression over last few months ? Etiology  FENO testing low.  Check chest xray today . Check PFT on return in 2 weeks  Labs today with cbc, bnp and bmet.   Plan  . Patient Instructions  Chest xray today .  Labs today .  Restart BIPAP At bedtime  .  Need to wear BIPAP .for at least 4hr each night.  Follow up with Dr. Chase Caller in 2 weeks with PFT and As needed   Please contact office for sooner follow up if symptoms do not improve or worsen or seek emergency care            Rexene Edison, NP 03/24/2017

## 2017-03-24 NOTE — Assessment & Plan Note (Signed)
Non compliant with BIPAP  Encouraged on compliance   Plan  Patient Instructions  Chest xray today .  Labs today .  Restart BIPAP At bedtime  .  Need to wear BIPAP .for at least 4hr each night.  Follow up with Dr. Chase Caller in 2 weeks with PFT and As needed   Please contact office for sooner follow up if symptoms do not improve or worsen or seek emergency care

## 2017-03-24 NOTE — Patient Instructions (Addendum)
Chest xray today .  Labs today .  Restart BIPAP At bedtime  .  Need to wear BIPAP .for at least 4hr each night.  Follow up with Dr. Chase Caller in 2 weeks with PFT and As needed   Please contact office for sooner follow up if symptoms do not improve or worsen or seek emergency care

## 2017-03-24 NOTE — Assessment & Plan Note (Signed)
DOE -Chronic with progression over last few months ? Etiology  FENO testing low.  Check chest xray today . Check PFT on return in 2 weeks  Labs today with cbc, bnp and bmet.   Plan  . Patient Instructions  Chest xray today .  Labs today .  Restart BIPAP At bedtime  .  Need to wear BIPAP .for at least 4hr each night.  Follow up with Dr. Chase Caller in 2 weeks with PFT and As needed   Please contact office for sooner follow up if symptoms do not improve or worsen or seek emergency care

## 2017-04-02 DIAGNOSIS — C61 Malignant neoplasm of prostate: Secondary | ICD-10-CM | POA: Diagnosis not present

## 2017-04-08 ENCOUNTER — Encounter: Payer: Self-pay | Admitting: Internal Medicine

## 2017-04-08 ENCOUNTER — Ambulatory Visit (INDEPENDENT_AMBULATORY_CARE_PROVIDER_SITE_OTHER): Payer: Medicare HMO | Admitting: Internal Medicine

## 2017-04-08 VITALS — BP 130/80 | HR 60 | Ht 72.0 in | Wt 328.0 lb

## 2017-04-08 DIAGNOSIS — R0602 Shortness of breath: Secondary | ICD-10-CM | POA: Diagnosis not present

## 2017-04-08 DIAGNOSIS — R0609 Other forms of dyspnea: Secondary | ICD-10-CM

## 2017-04-08 LAB — PULMONARY FUNCTION TEST
DL/VA % pred: 119 %
DL/VA: 5.65 ml/min/mmHg/L
DLCO COR: 25.7 ml/min/mmHg
DLCO cor % pred: 73 %
DLCO unc % pred: 68 %
DLCO unc: 23.86 ml/min/mmHg
FEF 25-75 Post: 1.81 L/sec
FEF 25-75 Pre: 1.06 L/sec
FEF2575-%Change-Post: 71 %
FEF2575-%PRED-PRE: 41 %
FEF2575-%Pred-Post: 71 %
FEV1-%CHANGE-POST: 12 %
FEV1-%PRED-PRE: 44 %
FEV1-%Pred-Post: 50 %
FEV1-Post: 1.71 L
FEV1-Pre: 1.53 L
FEV1FVC-%CHANGE-POST: 4 %
FEV1FVC-%Pred-Pre: 100 %
FEV6-%Change-Post: 6 %
FEV6-%PRED-PRE: 47 %
FEV6-%Pred-Post: 50 %
FEV6-PRE: 2.08 L
FEV6-Post: 2.21 L
FEV6FVC-%Change-Post: -1 %
FEV6FVC-%Pred-Post: 104 %
FEV6FVC-%Pred-Pre: 106 %
FVC-%Change-Post: 7 %
FVC-%Pred-Post: 48 %
FVC-%Pred-Pre: 44 %
FVC-Post: 2.24 L
FVC-Pre: 2.08 L
PRE FEV6/FVC RATIO: 100 %
Post FEV1/FVC ratio: 77 %
Post FEV6/FVC ratio: 99 %
Pre FEV1/FVC ratio: 73 %
RV % pred: 135 %
RV: 3.56 L
TLC % pred: 81 %
TLC: 6.08 L

## 2017-04-08 MED ORDER — FLUTICASONE FUROATE-VILANTEROL 100-25 MCG/INH IN AEPB: 1.0000 | INHALATION_SPRAY | Freq: Every day | RESPIRATORY_TRACT | 0 refills | Status: DC

## 2017-04-08 NOTE — Patient Instructions (Signed)
PFT done today. 

## 2017-04-08 NOTE — Patient Instructions (Addendum)
ICD-10-CM   1. Shortness of breath R06.02   2. Dyspnea on exertion R06.09    Do HRCT chest supine and prone Start breo inhaler - low dose - 1 puff daily Do ECHO cardiogram   Followup - re[place]urn to see me or APP next 4-6 weeks to review the above results and to see if inhaler has helped you with shortness of breath

## 2017-04-08 NOTE — Progress Notes (Signed)
Subjective:     Patient ID: [name], male   DOB: 1945/06/22, 71 y.o.   MRN: [id number]  HPI  71 yo male former smoker followed for severe OSA on BIPAP At bedtime   PMH of CAD, HTN  Tests PSG 04/07/14 >> AHI 97 BiPAP 04/26/15 to 2/07/1 # dyspnea due to obesity (copd ruled out) and   #- LLL 6 mmm pulmonary nodule since Jan 2011 in morbidly obese patient,   # OSA - intolerant to cPAP  #  sp bilateral LL pneumonia Oct 2010 and   # ex smoker.   OV 11/11/2011  Last seen Nov 2011. Then did not followup. Overal well. Dyspnea improved. NO COPD but did CAT score and result is 7. Has mild-mod cough; noted to be on ace inhibitor. Has not had CT for his lung nodule. Has sleep apnea and is not interested in cpaPast, Family, Social reviewed: no change since last visit7 >> used on 46 of 90 nights with average 11 hrs 4 min.  Average AHI 9.8 with BiPAP 12/8 cm H2O  03/24/2017 Follow up ; OSA on BIPAP ,Dyspnea  Patient returns for a follow-up. Patient has known severe sleep apnea and is on BiPAP at bedtime. He was last seen in February 2017. Patient admits that he has not been wearing his BiPAP. He says he's tried aware to couple times but just does not feel that is comfortable. We discussed his severe sleep apnea and need to wear his BiPAP. Discussed potential complications of untreated sleep apnea. Encouraged him on compliance.  Patient complains of shortness of breath that has been chronic and feels that his begin getting worse over the last several months. Over the last 3-4 weeks. Symptoms have been getting more and more pronounced. Patient was seen recently at his gastroenterologist for a screening colonoscopy and due to his dyspnea was recommended to see pulmonology for evaluation prior to getting a colonoscopy. Patient says he has intermittent dry cough and occasional wheezing. Patient was seen in 2013 by Dr. Chase Caller for shortness of breath. During that evaluation, he was felt not to have  COPD. Was felt that his dyspnea was related to obesity. Pt says he gets winded with walking . Wears out of breath. Unable to do yard work. No chest pain, calf pain or hemoptysis . Does have chronic ankle edema. No flare of swelling . On HCTZ daily. He is followed by Cardiology for CAD s/p Stents . Says he was seen last week by cards . No records available. FENO testing today 7ppb   No Known Allergies  OV 04/08/2017  Chief Complaint  Patient presents with  . Follow-up    PFT done today. Pt c/o SOB that comes and goes and dry cough. Denies any CP.    71 year old male that I personally have not seen in 5 years.At that time he had shortness of breath that labeled as down to obesity and physical deconditioning and associated diastolic dysfunction. In the interim he has been following up with Dr. Halford Chessman for sleep apnea. He recently saw my nurse for additional colonoscopy clearance and at that time he reported that his shortness of breath is significantly worsen the last 5 years. It is present for minimal exertion. He is also reporting choking episodes at night despite using his CPAP. She also pulmonary function test which I reviewed shows restriction with significant bronchodilator response and a slightly low DLCO 68%. He also reports some wheezing with exhaled nitric oxide was normal. He is  a previous 105 pack smoker   has a past medical history of Arthritis; Blind left eye; CAD (coronary artery disease); Complication of anesthesia; COPD (chronic obstructive pulmonary disease) ([place]); Coronary artery disease; GERD (gastroesophageal reflux disease); Gout; History of bleeding ulcers; History of gout; History of peptic ulcer disease; Hyperlipidemia; Hypertension; Hypertensive heart disease without CHF; Kidney stones; Lumbar disc disease; Myocardial infarction [facility]) (1995; 2007); Obesity; OSA (obstructive sleep apnea); OSA on CPAP; Pneumonia; Pneumonia (X 2); Prostate cancer ([place]); Pulmonary nodule; and Second  degree Mobitz I AV block.   reports that he quit smoking about 33 years ago. His smoking use included Cigarettes. He has a 105.00 pack-year smoking history. He has never used smokeless tobacco.  Past Surgical History:  Procedure Laterality Date  . APPENDECTOMY    . APPENDECTOMY  ~ 1985  . BACK SURGERY    . CARDIAC CATHETERIZATION N/A 02/08/2015   Procedure: Left Heart Cath and Coronary Angiography;  Surgeon: Leonie Man, MD;  Location: [place] [facility];  Service: Cardiovascular;  Laterality: N/A;  . CARDIAC CATHETERIZATION N/A 02/08/2015   Procedure: Coronary Stent Intervention;  Surgeon: Leonie Man, MD;  Location: [place] [facility];  Service: Cardiovascular;  Laterality: N/A;  . CHOLECYSTECTOMY    . CORONARY ANGIOPLASTY WITH STENT PLACEMENT  ~ 1995; 2007   "1 stent; 2 stents"  . LAPAROSCOPIC CHOLECYSTECTOMY    . LITHOTRIPSY  X 1  . LUMBAR LAMINECTOMY    . POSTERIOR LUMBAR FUSION  2003?  . PROSTATE BIOPSY  2015    No Known Allergies  Immunization History  Administered Date(s) Administered  . Influenza Whole 03/19/2009, 04/17/2010, 03/17/2011  . Influenza-Unspecified 03/16/2014, 02/07/2015, 03/09/2017  . Pneumococcal Polysaccharide-23 03/19/2009    Family History  Problem Relation Age of Onset  . Hypertension Mother   . Coronary artery disease Mother   . Aneurysm Father   . Hypertension Father   . COPD Sister   . Sleep apnea Unknown        3 siblings     Current Outpatient Prescriptions:  .  acetaminophen (TYLENOL) 500 MG tablet, Take 500 mg by mouth every 6 (six) hours as needed for moderate pain., Disp: , Rfl:  .  allopurinol (ZYLOPRIM) 300 MG tablet, Take 300 mg by mouth Daily. , Disp: , Rfl:  .  amLODipine (NORVASC) 10 MG tablet, Take 10 mg by mouth Daily. , Disp: , Rfl:  .  aspirin EC 81 MG EC tablet, Take 1 tablet (81 mg total) by mouth daily., Disp: , Rfl:  .  atorvastatin (LIPITOR) 20 MG tablet, Take 20 mg by mouth every other day., Disp: , Rfl:  .   diphenhydrAMINE (BENADRYL) 25 mg capsule, Take 50-75 mg by mouth daily as needed (for hayfever)., Disp: , Rfl:  .  hydrochlorothiazide (HYDRODIURIL) 25 MG tablet, Take 25 mg by mouth daily., Disp: , Rfl:  .  losartan (COZAAR) 100 MG tablet, Take 100 mg by mouth daily., Disp: , Rfl:  .  metoprolol succinate (TOPROL-XL) 50 MG 24 hr tablet, , Disp: , Rfl:  .  nitroGLYCERIN (NITROSTAT) 0.4 MG SL tablet, Place 1 tablet (0.4 mg total) under the tongue every 5 (five) minutes x 3 doses as needed for chest pain., Disp: 25 tablet, Rfl: 12 .  pantoprazole (PROTONIX) 40 MG tablet, , Disp: , Rfl:  .  traMADol (ULTRAM) 50 MG tablet, , Disp: , Rfl:     Review of Systems     Objective:   Physical Exam  Constitutional:  He is oriented to person, place, and time. He appears well-developed and well-nourished. No distress.  Morbidly obese  HENT:  Head: Normocephalic and atraumatic.  Right Ear: External ear normal.  Left Ear: External ear normal.  Mouth/Throat: Oropharynx is clear and moist. No oropharyngeal exudate.  Mallampati class IV  Eyes: Pupils are equal, round, and reactive to light. Conjunctivae and EOM are normal. Right eye exhibits no discharge. Left eye exhibits no discharge. No scleral icterus.  Neck: Normal range of motion. Neck supple. No JVD present. No tracheal deviation present. No thyromegaly present.  Cardiovascular: Normal rate, regular rhythm and intact distal pulses.  Exam reveals no gallop and no friction rub.   No murmur heard. Pulmonary/Chest: Effort normal and breath sounds normal. No respiratory distress. He has no wheezes. He has no rales. He exhibits no tenderness.  Noisy breathing  Abdominal: Soft. Bowel sounds are normal. He exhibits no distension and no mass. There is no tenderness. There is no rebound and no guarding.  Visible obesity present  Musculoskeletal: Normal range of motion. He exhibits no edema or tenderness.  Lymphadenopathy:    He has no cervical adenopathy.   Neurological: He is alert and oriented to person, place, and time. He has normal reflexes. No cranial nerve deficit. Coordination normal.  Skin: Skin is warm and dry. No rash noted. He is not diaphoretic. No erythema. No pallor.  Psychiatric: He has a normal mood and affect. His behavior is normal. Judgment and thought content normal.  Nursing note and vitals reviewed.  Vitals:   04/08/17 0950  BP: 130/80  Pulse: 60  SpO2: 95%  Weight: (!) 328 lb (148.8 kg)  Height: 6' (1.829 m)    Estimated body mass index is 44.48 kg/m as calculated from the following:   Height as of this encounter: 6' (1.829 m).   Weight as of this encounter: 328 lb (148.8 kg).     Assessment:       ICD-10-CM   1. Shortness of breath R06.02   2. Dyspnea on exertion R06.09        Plan:     It is possible that he has COPD and maybe incorrect diagnosis 5 years ago. Alternatively this could all be diastolic dysfunction and obesity physical deconditioning related dyspnea.   We'll start him on) Brio inhaler to see a response to this. Walking desaturation test in our office Get a high-resolution CT scan of the chestSupine and prone images Get echocardiogram  Review of in few to several weeks after the above   > 50% of this > 25 min visit spent in face to face counseling or coordination of care    Dr. Brand Males, M.D., [facility].C.P Pulmonary and Critical Care Medicine Staff Physician [place] [facility] Pager: [phone], If no answer or between  15:00h - 7:00h: call [phone]  04/08/2017 10:22 AM

## 2017-04-09 DIAGNOSIS — C61 Malignant neoplasm of prostate: Secondary | ICD-10-CM | POA: Diagnosis not present

## 2017-04-09 DIAGNOSIS — N402 Nodular prostate without lower urinary tract symptoms: Secondary | ICD-10-CM | POA: Diagnosis not present

## 2017-04-13 ENCOUNTER — Other Ambulatory Visit: Payer: Medicare HMO

## 2017-04-15 ENCOUNTER — Other Ambulatory Visit: Payer: Self-pay

## 2017-04-15 ENCOUNTER — Ambulatory Visit (HOSPITAL_COMMUNITY): Payer: Medicare HMO | Attending: Cardiovascular Disease

## 2017-04-15 ENCOUNTER — Ambulatory Visit (INDEPENDENT_AMBULATORY_CARE_PROVIDER_SITE_OTHER)
Admission: RE | Admit: 2017-04-15 | Discharge: 2017-04-15 | Disposition: A | Discharge status: Home / Self Care | Payer: Medicare HMO | Source: Ambulatory Visit | Attending: Internal Medicine | Admitting: Internal Medicine

## 2017-04-15 DIAGNOSIS — R0602 Shortness of breath: Secondary | ICD-10-CM

## 2017-04-15 DIAGNOSIS — J984 Other disorders of lung: Secondary | ICD-10-CM | POA: Diagnosis not present

## 2017-04-17 DIAGNOSIS — G4733 Obstructive sleep apnea (adult) (pediatric): Secondary | ICD-10-CM | POA: Diagnosis not present

## 2017-04-24 ENCOUNTER — Telehealth: Payer: Self-pay | Admitting: Internal Medicine

## 2017-04-24 NOTE — Telephone Encounter (Signed)
Called pt letting him know the results of his CT scan and Echo he had done. Pt expressed understanding. Nothing further needed.

## 2017-04-24 NOTE — Telephone Encounter (Signed)
Echo - very likekly has diast dysfunction   CT chest - no ILD   Wil discuss at fu  Dr. Brand Males, M.D., [facility].C.P Pulmonary and Critical Care Medicine Staff Physician [place] [facility] Pager: [phone], If no answer or between  15:00h - 7:00h: call [phone]  04/24/2017 2:42 PM

## 2017-05-06 ENCOUNTER — Ambulatory Visit: Payer: Medicare HMO | Admitting: Adult Health

## 2017-05-06 ENCOUNTER — Encounter: Payer: Self-pay | Admitting: Adult Health

## 2017-05-06 DIAGNOSIS — G4733 Obstructive sleep apnea (adult) (pediatric): Secondary | ICD-10-CM

## 2017-05-06 DIAGNOSIS — J449 Chronic obstructive pulmonary disease, unspecified: Secondary | ICD-10-CM | POA: Diagnosis not present

## 2017-05-06 DIAGNOSIS — R0600 Dyspnea, unspecified: Secondary | ICD-10-CM

## 2017-05-06 DIAGNOSIS — J45901 Unspecified asthma with (acute) exacerbation: Secondary | ICD-10-CM | POA: Insufficient documentation

## 2017-05-06 MED ORDER — FLUTICASONE FUROATE-VILANTEROL 100-25 MCG/INH IN AEPB: 1.0000 | INHALATION_SPRAY | Freq: Every day | RESPIRATORY_TRACT | 0 refills | Status: DC

## 2017-05-06 MED ORDER — FLUTICASONE FUROATE-VILANTEROL 100-25 MCG/INH IN AEPB: 1.0000 | INHALATION_SPRAY | Freq: Every day | RESPIRATORY_TRACT | 5 refills | Status: DC

## 2017-05-06 NOTE — Addendum Note (Signed)
Addended by: Parke Poisson E on: 05/06/2017 10:01 AM   Modules accepted: Orders

## 2017-05-06 NOTE — Assessment & Plan Note (Signed)
COPD /Asthma w/ mainly restriction on PFT with reversibilitty   Plan  Cont on BREO .

## 2017-05-06 NOTE — Progress Notes (Signed)
@Patient  ID: [name], male    DOB: 08-19-1945, 71 y.o.   MRN: [id number]  Chief Complaint  Patient presents with  . Follow-up    dyspnea     Referring provider: Maury Dus, MD  HPI: 71 yo male former smoker followed for severe OSA on BIPAP At bedtime   PMH of CAD, HTN  Tests PSG 04/07/14 >> AHI 97 BiPAP 04/26/15 to 07/24/15 >>used on 46 of 90 nights with average 11 hrs 4 min. Average AHI 9.8 with BiPAP 12/8 cm H2O   05/06/2017 Follow up: Dyspnea/COPD/Asthma /OSA  Pt returns for 1 month follow up . He was seen last office visit with dyspnea that has been getting worse over last 6-12 month. Had some mild intermittent dry cough and wheezing. PFT  Severe restriction with significant BD response. DLCO was decreased. He was set up for a HRCT chest that was neg for ILD. Showed linear scarring in lower lobes. Echo showed EF 60-65%. Mod LA dilation . PAP 36 mmHg. He was started on BREO . He says he might have some improvement in dyspnea but still gets winded with walking and bending over to put on shoes.  Denies chest pain , orthopnea , increased edema.  He has chronic ankle edema , takes HCTZ daily.   Has very severe OSA but still not wearing BIPAP . We discussed importance of BIPAP compliance. Says he feels mask is blowing to hard on him.  Pt education on OSA .     No Known Allergies  Immunization History  Administered Date(s) Administered  . Influenza Whole 03/19/2009, 04/17/2010, 03/17/2011  . Influenza-Unspecified 03/16/2014, 02/07/2015, 03/09/2017  . Pneumococcal Conjugate-13 05/06/2016  . Pneumococcal Polysaccharide-23 03/19/2009    Past Medical History:  Diagnosis Date  . Arthritis    "left shoulder" (02/09/2015)  . Blind left eye   . CAD (coronary artery disease)   . Complication of anesthesia    "they give me too much anesthesia & had to put me on life support for a little bit w/gallbladder OR"  . COPD (chronic obstructive pulmonary disease) ([place])   .  Coronary artery disease   . GERD (gastroesophageal reflux disease)   . Gout   . History of bleeding ulcers   . History of gout   . History of peptic ulcer disease   . Hyperlipidemia   . Hypertension   . Hypertensive heart disease without CHF   . Kidney stones   . Lumbar disc disease   . Myocardial infarction [facility]) 1995; 2007  . Obesity   . OSA (obstructive sleep apnea)   . OSA on CPAP   . Pneumonia   . Pneumonia X 2  . Prostate cancer ([place])   . Pulmonary nodule   . Second degree Mobitz I AV block     Tobacco History: Social History   Tobacco Use  Smoking Status Former Smoker  . Packs/day: 3.00  . Years: 35.00  . Pack years: 105.00  . Types: Cigarettes  . Last attempt to quit: 06/17/1983  . Years since quitting: 33.9  Smokeless Tobacco Never Used  Tobacco Comment   "quit smoking cigarettes in 1985   Counseling given: Not Answered Comment: "quit smoking cigarettes in 1985   Outpatient Encounter Medications as of 05/06/2017  Medication Sig  . acetaminophen (TYLENOL) 500 MG tablet Take 500 mg by mouth every 6 (six) hours as needed for moderate pain.  Marland Kitchen allopurinol (ZYLOPRIM) 300 MG tablet Take 300 mg by mouth Daily.   Marland Kitchen  amLODipine (NORVASC) 10 MG tablet Take 10 mg by mouth Daily.   Marland Kitchen aspirin EC 81 MG EC tablet Take 1 tablet (81 mg total) by mouth daily.  Marland Kitchen atorvastatin (LIPITOR) 20 MG tablet Take 20 mg by mouth every other day.  . diphenhydrAMINE (BENADRYL) 25 mg capsule Take 50-75 mg by mouth daily as needed (for hayfever).  . fluticasone furoate-vilanterol (BREO ELLIPTA) 100-25 MCG/INH AEPB Inhale 1 puff into the lungs daily.  . hydrochlorothiazide (HYDRODIURIL) 25 MG tablet Take 25 mg by mouth daily.  Marland Kitchen losartan (COZAAR) 100 MG tablet Take 100 mg by mouth daily.  . metoprolol succinate (TOPROL-XL) 50 MG 24 hr tablet Take 50 mg by mouth daily.   . nitroGLYCERIN (NITROSTAT) 0.4 MG SL tablet Place 1 tablet (0.4 mg total) under the tongue every 5 (five) minutes x 3 doses  as needed for chest pain.  . pantoprazole (PROTONIX) 40 MG tablet   . traMADol (ULTRAM) 50 MG tablet Take 50 mg by mouth every 6 (six) hours as needed.   . [DISCONTINUED] fluticasone furoate-vilanterol (BREO ELLIPTA) 100-25 MCG/INH AEPB Inhale 1 puff into the lungs daily.   No facility-administered encounter medications on file as of 05/06/2017.      Review of Systems  Constitutional:   No  weight loss, night sweats,  Fevers, chills, + fatigue, or  lassitude.  HEENT:   No headaches,  Difficulty swallowing,  Tooth/dental problems, or  Sore throat,                No sneezing, itching, ear ache, nasal congestion, post nasal drip,   CV:  No chest pain,  Orthopnea, PND,  , anasarca, dizziness, palpitations, syncope.   GI  No heartburn, indigestion, abdominal pain, nausea, vomiting, diarrhea, change in bowel habits, loss of appetite, bloody stools.   Resp:    No chest wall deformity  Skin: no rash or lesions.  GU: no dysuria, change in color of urine, no urgency or frequency.  No flank pain, no hematuria   MS:  No joint pain or swelling.  No decreased range of motion.  No back pain.    Physical Exam  BP 132/82 (BP Location: Right Arm, Cuff Size: Large)   Pulse (!) 57   Ht 6' (1.829 m)   Wt (!) 326 lb (147.9 kg)   SpO2 96%   BMI 44.21 kg/m   GEN: A/Ox3; pleasant , NAD, obese    HEENT:  [place]/AT,  EACs-clear, TMs-wnl, NOSE-clear, THROAT-clear, no lesions, no postnasal drip or exudate noted.   NECK:  Supple w/ fair ROM; no JVD; normal carotid impulses w/o bruits; no thyromegaly or nodules palpated; no lymphadenopathy.    RESP  Clear  P & A; w/o, wheezes/ rales/ or rhonchi. no accessory muscle use, no dullness to percussion  CARD:  RRR, no m/r/g, 1+ peripheral edema, pulses intact, no cyanosis or clubbing.  GI:   Soft & nt; nml bowel sounds; no organomegaly or masses detected.   Musco: Warm bil, no deformities or joint swelling noted.   Neuro: alert, no focal deficits noted.     Skin: Warm, no lesions or rashes    Lab Results:  CBC    Component Value Date/Time   WBC 9.8 03/24/2017 1634   RBC 5.35 03/24/2017 1634   HGB 16.1 03/24/2017 1634   HCT 48.8 03/24/2017 1634   PLT 205.0 03/24/2017 1634   MCV 91.3 03/24/2017 1634   MCH 29.6 02/09/2015 0310   MCHC 33.0 03/24/2017 1634  RDW 13.8 03/24/2017 1634   LYMPHSABS 3.3 03/24/2017 1634   MONOABS 0.9 03/24/2017 1634   EOSABS 0.4 03/24/2017 1634   BASOSABS 0.1 03/24/2017 1634    BMET    Component Value Date/Time   NA 141 03/24/2017 1634   K 4.3 03/24/2017 1634   CL 101 03/24/2017 1634   CO2 35 (H) 03/24/2017 1634   GLUCOSE 98 03/24/2017 1634   BUN 20 03/24/2017 1634   CREATININE 1.25 03/24/2017 1634   CALCIUM 10.5 03/24/2017 1634   GFRNONAA 41 (L) 02/09/2015 0310   GFRAA 48 (L) 02/09/2015 0310    BNP No results found for: BNP  ProBNP    Component Value Date/Time   PROBNP 190.0 (H) 03/24/2017 1634    Imaging: Ct Chest High Resolution  Result Date: 04/15/2017 CLINICAL DATA:  Increase shortness of breath for 3 weeks.  COPD. EXAM: CT CHEST WITHOUT CONTRAST TECHNIQUE: Multidetector CT imaging of the chest was performed following the standard protocol without intravenous contrast. High resolution imaging of the lungs, as well as inspiratory and expiratory imaging, was performed. COMPARISON:  11/17/2011, 03/22/2010 and 07/02/2009. FINDINGS: Cardiovascular: Atherosclerotic calcification of the arterial vasculature, including severe involvement of the coronary arteries. Heart is mildly enlarged. No pericardial effusion. Mediastinum/Nodes: No pathologically enlarged mediastinal or axillary lymph nodes. Hilar regions are difficult to definitively evaluate without IV contrast but appear grossly unremarkable. Esophagus is grossly unremarkable. Lungs/Pleura: A few scattered subpleural nodules measure up to 7 mm in the subpleural left lower lobe, stable and therefore benign. No subpleural reticulation,  traction bronchiectasis/bronchiolectasis, ground-glass, architectural distortion or honeycombing. Scattered linear scarring in the lingula and both lower lobes. No air trapping. No pleural fluid. Airway is unremarkable. Upper Abdomen: Visualized portions of the liver, adrenal glands, spleen, pancreas, stomach and bowel are grossly unremarkable. Cholecystectomy. No upper abdominal adenopathy. Musculoskeletal: Degenerative changes in the spine. IMPRESSION: 1. No evidence of interstitial lung disease. 2. Linear scarring in both lower lobes. 3. Aortic atherosclerosis (ICD10-170.0). Severe coronary artery calcification. Electronically Signed   By: Lorin Picket M.D.   On: 04/15/2017 13:23     Assessment & Plan:   OSA (obstructive sleep apnea) Very severe OSA . Needs to restart BIPAP  Change to nasal mask with chin strap   Plan  Patient Instructions  Restart BIPAP At bedtime  . Very important to start back on this.  Need to wear BIPAP .for at least 4hr each night.  Order for new nasal mask.  Continue on BREO daily .  Follow up with Dr. Halford Chessman or Braylin Xu in 6 weeks and As needed   Follow up with Dr. Chase Caller in 4 months and As needed   Please contact office for sooner follow up if symptoms do not improve or worsen or seek emergency care        Dyspnea Dyspnea suspect is multifactoral with underlying COPD/Asthma , Morbid obesity , D CHF , deconditioing and mild PUlmonary HTN (uncontrolled OSA) .  W/up -CT neg for ILD  Echo stable  No anemia   Plan  restart BIPAP  Wt loss  Cont on BREO    COPD (chronic obstructive pulmonary disease) ([place]) COPD /Asthma w/ mainly restriction on PFT with reversibilitty   Plan  Cont on BREO .      Rexene Edison, NP 05/06/2017

## 2017-05-06 NOTE — Assessment & Plan Note (Signed)
Very severe OSA . Needs to restart BIPAP  Change to nasal mask with chin strap   Plan  Patient Instructions  Restart BIPAP At bedtime  . Very important to start back on this.  Need to wear BIPAP .for at least 4hr each night.  Order for new nasal mask.  Continue on BREO daily .  Follow up with Dr. Halford Chessman or Hancel Ion in 6 weeks and As needed   Follow up with Dr. Chase Caller in 4 months and As needed   Please contact office for sooner follow up if symptoms do not improve or worsen or seek emergency care

## 2017-05-06 NOTE — Assessment & Plan Note (Signed)
Dyspnea suspect is multifactoral with underlying COPD/Asthma , Morbid obesity , D CHF , deconditioing and mild PUlmonary HTN (uncontrolled OSA) .  W/up -CT neg for ILD  Echo stable  No anemia   Plan  restart BIPAP  Wt loss  Cont on BREO

## 2017-05-06 NOTE — Patient Instructions (Signed)
Restart BIPAP At bedtime  . Very important to start back on this.  Need to wear BIPAP .for at least 4hr each night.  Order for new nasal mask.  Continue on BREO daily .  Follow up with Dr. Halford Chessman or Terique Kawabata in 6 weeks and As needed   Follow up with Dr. Chase Caller in 4 months and As needed   Please contact office for sooner follow up if symptoms do not improve or worsen or seek emergency care

## 2017-05-10 NOTE — Progress Notes (Signed)
I have reviewed and agree with assessment/plan.  Chesley Mires, MD [facility] Pulmonary/Critical Care 05/10/2017, 10:03 AM Pager:  [phone]

## 2017-05-17 DIAGNOSIS — G4733 Obstructive sleep apnea (adult) (pediatric): Secondary | ICD-10-CM | POA: Diagnosis not present

## 2017-05-18 DIAGNOSIS — G4733 Obstructive sleep apnea (adult) (pediatric): Secondary | ICD-10-CM | POA: Diagnosis not present

## 2017-06-01 DIAGNOSIS — M25562 Pain in left knee: Secondary | ICD-10-CM | POA: Diagnosis not present

## 2017-06-01 DIAGNOSIS — M1712 Unilateral primary osteoarthritis, left knee: Secondary | ICD-10-CM | POA: Diagnosis not present

## 2017-06-01 DIAGNOSIS — M25561 Pain in right knee: Secondary | ICD-10-CM | POA: Diagnosis not present

## 2017-06-01 DIAGNOSIS — M1711 Unilateral primary osteoarthritis, right knee: Secondary | ICD-10-CM | POA: Diagnosis not present

## 2017-06-17 ENCOUNTER — Ambulatory Visit: Payer: Medicare HMO | Admitting: Adult Health

## 2017-06-17 ENCOUNTER — Encounter: Payer: Self-pay | Admitting: Adult Health

## 2017-06-17 DIAGNOSIS — G4733 Obstructive sleep apnea (adult) (pediatric): Secondary | ICD-10-CM

## 2017-06-17 NOTE — Assessment & Plan Note (Signed)
COPD/Asthma -stable   Plan  Cont on BREO .

## 2017-06-17 NOTE — Patient Instructions (Addendum)
Keep up good work .  Wear BIPAP At bedtime    Continue on BREO daily . Rinse after use .  Follow up with Dr. Chase Caller in 3 months and As needed   Follow up with Dr. Halford Chessman  In 6 months and As needed   Please contact office for sooner follow up if symptoms do not improve or worsen or seek emergency care

## 2017-06-17 NOTE — Assessment & Plan Note (Signed)
Much improved with excellent compliance and control   Plan  Patient Instructions  Keep up good work .  Wear BIPAP At bedtime    Continue on BREO daily . Rinse after use .  Follow up with Dr. Chase Caller in 3 months and As needed   Follow up with Dr. Halford Chessman  In 6 months and As needed   Please contact office for sooner follow up if symptoms do not improve or worsen or seek emergency care

## 2017-06-17 NOTE — Progress Notes (Signed)
@Patient  ID: [name], male    DOB: [birth year], 72 y.o.   MRN: [id number]  Chief Complaint  Patient presents with  . Follow-up    OSA     Referring provider: Maury Dus, MD  HPI: 72 yo male former smoker followed for severe OSA and COPD/Asthma  PMH of CAD, HTN  Tests PSG 04/07/14 >> AHI 97 BiPAP 04/26/15 to 07/24/15 >>used on 46 of 90 nights with average 11 hrs 4 min. Average AHI 9.8 with BiPAP 12/8 cm H2O PFT April 08, 2017 showed FEV1 at 50%, ratio 77, FVC 48%, positive bronchodilator response, DLCO 68%. HRCT chest April 15 2017- negative for ILD, linear scarring in both lower lobes.  06/17/2017 Follow up: OSA/Asthma  Patient returns for a 45-month follow-up.  Patient has known severe sleep apnea is on BiPAP at bedtime.  Patient was recently having trouble with compliance.  But has restarted his BiPAP.  He says he is feeling better with improved sleep.  He denies significant daytime sleepiness.  Download shows excellent compliance , avg usage at 5.5 hr . AHI 4.6 . He says he feels so much better.   Pt has COPD and Asthma . He remains on BREO . Says it is working okay. No flare of cough or wheezing .     No Known Allergies  Immunization History  Administered Date(s) Administered  . Influenza Whole 03/19/2009, 04/17/2010, 03/17/2011  . Influenza-Unspecified 03/16/2014, 02/07/2015, 03/09/2017  . Pneumococcal Conjugate-13 05/06/2016  . Pneumococcal Polysaccharide-23 03/19/2009    Past Medical History:  Diagnosis Date  . Arthritis    "left shoulder" (02/09/2015)  . Blind left eye   . CAD (coronary artery disease)   . Complication of anesthesia    "they give me too much anesthesia & had to put me on life support for a little bit w/gallbladder OR"  . COPD (chronic obstructive pulmonary disease) ([place])   . Coronary artery disease   . GERD (gastroesophageal reflux disease)   . Gout   . History of bleeding ulcers   . History of gout   . History of peptic ulcer  disease   . Hyperlipidemia   . Hypertension   . Hypertensive heart disease without CHF   . Kidney stones   . Lumbar disc disease   . Myocardial infarction [facility]) 1995; 2007  . Obesity   . OSA (obstructive sleep apnea)   . OSA on CPAP   . Pneumonia   . Pneumonia X 2  . Prostate cancer ([place])   . Pulmonary nodule   . Second degree Mobitz I AV block     Tobacco History: Social History   Tobacco Use  Smoking Status Former Smoker  . Packs/day: 3.00  . Years: 35.00  . Pack years: 105.00  . Types: Cigarettes  . Last attempt to quit: 06/17/1983  . Years since quitting: 34.0  Smokeless Tobacco Never Used  Tobacco Comment   "quit smoking cigarettes in 1985   Counseling given: Not Answered Comment: "quit smoking cigarettes in 1985   Outpatient Encounter Medications as of 06/17/2017  Medication Sig  . acetaminophen (TYLENOL) 500 MG tablet Take 500 mg by mouth every 6 (six) hours as needed for moderate pain.  Marland Kitchen allopurinol (ZYLOPRIM) 300 MG tablet Take 300 mg by mouth Daily.   Marland Kitchen amLODipine (NORVASC) 10 MG tablet Take 10 mg by mouth Daily.   Marland Kitchen aspirin EC 81 MG EC tablet Take 1 tablet (81 mg total) by mouth daily.  . diphenhydrAMINE (BENADRYL)  25 mg capsule Take 50-75 mg by mouth daily as needed (for hayfever).  . fluticasone furoate-vilanterol (BREO ELLIPTA) 100-25 MCG/INH AEPB Inhale 1 puff into the lungs daily.  . fluticasone furoate-vilanterol (BREO ELLIPTA) 100-25 MCG/INH AEPB Inhale 1 puff into the lungs daily.  . hydrochlorothiazide (HYDRODIURIL) 25 MG tablet Take 25 mg by mouth daily.  Marland Kitchen losartan (COZAAR) 100 MG tablet Take 100 mg by mouth daily.  . metoprolol succinate (TOPROL-XL) 50 MG 24 hr tablet Take 50 mg by mouth daily.   . nitroGLYCERIN (NITROSTAT) 0.4 MG SL tablet Place 1 tablet (0.4 mg total) under the tongue every 5 (five) minutes x 3 doses as needed for chest pain.  . pantoprazole (PROTONIX) 40 MG tablet   . traMADol (ULTRAM) 50 MG tablet Take 50 mg by mouth every 6  (six) hours as needed.   . [DISCONTINUED] atorvastatin (LIPITOR) 20 MG tablet Take 20 mg by mouth every other day.   No facility-administered encounter medications on file as of 06/17/2017.      Review of Systems  Constitutional:   No  weight loss, night sweats,  Fevers, chills, fatigue, or  lassitude.  HEENT:   No headaches,  Difficulty swallowing,  Tooth/dental problems, or  Sore throat,                No sneezing, itching, ear ache, nasal congestion, post nasal drip,   CV:  No chest pain,  Orthopnea, PND, swelling in lower extremities, anasarca, dizziness, palpitations, syncope.   GI  No heartburn, indigestion, abdominal pain, nausea, vomiting, diarrhea, change in bowel habits, loss of appetite, bloody stools.   Resp:  No excess mucus, no productive cough,  No non-productive cough,  No coughing up of blood.  No change in color of mucus.  No wheezing.  No chest wall deformity  Skin: no rash or lesions.  GU: no dysuria, change in color of urine, no urgency or frequency.  No flank pain, no hematuria   MS:  No joint pain or swelling.  No decreased range of motion.  No back pain.    Physical Exam  BP 138/80   Pulse 80   Ht 6\' 2"  (1.88 m)   Wt (!) 323 lb 8 oz (146.7 kg)   SpO2 97%   BMI 41.53 kg/m   GEN: A/Ox3; pleasant , NAD, obese    HEENT:  [place]/AT,  EACs-clear, TMs-wnl, NOSE-clear, THROAT-clear, no lesions, no postnasal drip or exudate noted. Class 2-3 MP airway   NECK:  Supple w/ fair ROM; no JVD; normal carotid impulses w/o bruits; no thyromegaly or nodules palpated; no lymphadenopathy.    RESP  Clear  P & A; w/o, wheezes/ rales/ or rhonchi. no accessory muscle use, no dullness to percussion  CARD:  RRR, no m/r/g, tr  peripheral edema, pulses intact, no cyanosis or clubbing.  GI:   Soft & nt; nml bowel sounds; no organomegaly or masses detected.   Musco: Warm bil, no deformities or joint swelling noted.   Neuro: alert, no focal deficits noted.    Skin: Warm, no  lesions or rashes    Lab Results:  CBC  BNP No results found for: BNP  ProBNP  Imaging: No results found.   Assessment & Plan:   OSA (obstructive sleep apnea) Much improved with excellent compliance and control   Plan  Patient Instructions  Keep up good work .  Wear BIPAP At bedtime    Continue on BREO daily . Rinse after use .  Follow  up with Dr. Chase Caller in 3 months and As needed   Follow up with Dr. Halford Chessman  In 6 months and As needed   Please contact office for sooner follow up if symptoms do not improve or worsen or seek emergency care         COPD (chronic obstructive pulmonary disease) ([place]) COPD/Asthma -stable   Plan  Cont on BREO .      Rexene Edison, NP 06/17/2017

## 2017-07-18 DIAGNOSIS — G4733 Obstructive sleep apnea (adult) (pediatric): Secondary | ICD-10-CM | POA: Diagnosis not present

## 2017-07-28 DIAGNOSIS — C61 Malignant neoplasm of prostate: Secondary | ICD-10-CM | POA: Diagnosis not present

## 2017-07-28 DIAGNOSIS — N430 Encysted hydrocele: Secondary | ICD-10-CM | POA: Diagnosis not present

## 2017-08-15 DIAGNOSIS — G4733 Obstructive sleep apnea (adult) (pediatric): Secondary | ICD-10-CM | POA: Diagnosis not present

## 2017-08-17 DIAGNOSIS — G4733 Obstructive sleep apnea (adult) (pediatric): Secondary | ICD-10-CM | POA: Diagnosis not present

## 2017-08-18 DIAGNOSIS — G4733 Obstructive sleep apnea (adult) (pediatric): Secondary | ICD-10-CM | POA: Diagnosis not present

## 2017-08-21 DIAGNOSIS — G4733 Obstructive sleep apnea (adult) (pediatric): Secondary | ICD-10-CM | POA: Diagnosis not present

## 2017-08-26 DIAGNOSIS — Z6841 Body Mass Index (BMI) 40.0 and over, adult: Secondary | ICD-10-CM | POA: Diagnosis not present

## 2017-08-26 DIAGNOSIS — I2510 Atherosclerotic heart disease of native coronary artery without angina pectoris: Secondary | ICD-10-CM | POA: Diagnosis not present

## 2017-08-26 DIAGNOSIS — N183 Chronic kidney disease, stage 3 (moderate): Secondary | ICD-10-CM | POA: Diagnosis not present

## 2017-08-26 DIAGNOSIS — I129 Hypertensive chronic kidney disease with stage 1 through stage 4 chronic kidney disease, or unspecified chronic kidney disease: Secondary | ICD-10-CM | POA: Diagnosis not present

## 2017-08-26 DIAGNOSIS — E7800 Pure hypercholesterolemia, unspecified: Secondary | ICD-10-CM | POA: Diagnosis not present

## 2017-08-26 DIAGNOSIS — M109 Gout, unspecified: Secondary | ICD-10-CM | POA: Diagnosis not present

## 2017-08-26 DIAGNOSIS — C61 Malignant neoplasm of prostate: Secondary | ICD-10-CM | POA: Diagnosis not present

## 2017-08-26 DIAGNOSIS — K219 Gastro-esophageal reflux disease without esophagitis: Secondary | ICD-10-CM | POA: Diagnosis not present

## 2017-09-11 DIAGNOSIS — Z8711 Personal history of peptic ulcer disease: Secondary | ICD-10-CM | POA: Diagnosis not present

## 2017-09-11 DIAGNOSIS — I441 Atrioventricular block, second degree: Secondary | ICD-10-CM | POA: Diagnosis not present

## 2017-09-11 DIAGNOSIS — I119 Hypertensive heart disease without heart failure: Secondary | ICD-10-CM | POA: Diagnosis not present

## 2017-09-11 DIAGNOSIS — G4733 Obstructive sleep apnea (adult) (pediatric): Secondary | ICD-10-CM | POA: Diagnosis not present

## 2017-09-11 DIAGNOSIS — I2510 Atherosclerotic heart disease of native coronary artery without angina pectoris: Secondary | ICD-10-CM | POA: Diagnosis not present

## 2017-09-11 DIAGNOSIS — Z79899 Other long term (current) drug therapy: Secondary | ICD-10-CM | POA: Diagnosis not present

## 2017-09-11 DIAGNOSIS — Z01810 Encounter for preprocedural cardiovascular examination: Secondary | ICD-10-CM | POA: Diagnosis not present

## 2017-09-11 DIAGNOSIS — E785 Hyperlipidemia, unspecified: Secondary | ICD-10-CM | POA: Diagnosis not present

## 2017-09-15 DIAGNOSIS — G4733 Obstructive sleep apnea (adult) (pediatric): Secondary | ICD-10-CM | POA: Diagnosis not present

## 2017-09-21 DIAGNOSIS — N183 Chronic kidney disease, stage 3 (moderate): Secondary | ICD-10-CM | POA: Diagnosis not present

## 2017-10-06 ENCOUNTER — Ambulatory Visit: Payer: Medicare HMO | Admitting: Internal Medicine

## 2017-10-07 DIAGNOSIS — C61 Malignant neoplasm of prostate: Secondary | ICD-10-CM | POA: Diagnosis not present

## 2017-10-07 DIAGNOSIS — K5901 Slow transit constipation: Secondary | ICD-10-CM | POA: Diagnosis not present

## 2017-10-12 ENCOUNTER — Ambulatory Visit: Payer: Medicare HMO | Admitting: Internal Medicine

## 2017-10-15 ENCOUNTER — Ambulatory Visit: Payer: Medicare HMO | Admitting: Adult Health

## 2017-10-15 DIAGNOSIS — G4733 Obstructive sleep apnea (adult) (pediatric): Secondary | ICD-10-CM | POA: Diagnosis not present

## 2017-10-19 ENCOUNTER — Ambulatory Visit: Payer: Medicare HMO | Admitting: Internal Medicine

## 2017-10-19 ENCOUNTER — Encounter: Payer: Self-pay | Admitting: Internal Medicine

## 2017-10-19 VITALS — BP 128/80 | HR 42 | Ht 74.0 in | Wt 314.8 lb

## 2017-10-19 DIAGNOSIS — J449 Chronic obstructive pulmonary disease, unspecified: Secondary | ICD-10-CM

## 2017-10-19 DIAGNOSIS — R0600 Dyspnea, unspecified: Secondary | ICD-10-CM | POA: Diagnosis not present

## 2017-10-19 DIAGNOSIS — N289 Disorder of kidney and ureter, unspecified: Secondary | ICD-10-CM | POA: Diagnosis not present

## 2017-10-19 MED ORDER — TIOTROPIUM BROMIDE MONOHYDRATE 1.25 MCG/ACT IN AERS: 2.0000 | INHALATION_SPRAY | Freq: Every day | RESPIRATORY_TRACT | 5 refills | Status: DC

## 2017-10-19 NOTE — Progress Notes (Signed)
Subjective:     Patient ID: [name], male   DOB: Jul 23, 1945, 72 y.o.   MRN: [id number]  HPI   72 yo male former smoker followed for severe OSA on BIPAP At bedtime   PMH of CAD, HTN  Tests PSG 04/07/14 >> AHI 97 BiPAP 04/26/15 to 2/07/1 # dyspnea due to obesity (copd ruled out) and   #- LLL 6 mmm pulmonary nodule since Jan 2011 in morbidly obese patient,   # OSA - intolerant to cPAP  #  sp bilateral LL pneumonia Oct 2010 and   # ex smoker.   OV 11/11/2011  Last seen Nov 2011. Then did not followup. Overal well. Dyspnea improved. NO COPD but did CAT score and result is 7. Has mild-mod cough; noted to be on ace inhibitor. Has not had CT for his lung nodule. Has sleep apnea and is not interested in cpaPast, Family, Social reviewed: no change since last visit7 >> used on 46 of 90 nights with average 11 hrs 4 min.  Average AHI 9.8 with BiPAP 12/8 cm H2O  03/24/2017 Follow up ; OSA on BIPAP ,Dyspnea  Patient returns for a follow-up. Patient has known severe sleep apnea and is on BiPAP at bedtime. He was last seen in February 2017. Patient admits that he has not been wearing his BiPAP. He says he's tried aware to couple times but just does not feel that is comfortable. We discussed his severe sleep apnea and need to wear his BiPAP. Discussed potential complications of untreated sleep apnea. Encouraged him on compliance.  Patient complains of shortness of breath that has been chronic and feels that his begin getting worse over the last several months. Over the last 3-4 weeks. Symptoms have been getting more and more pronounced. Patient was seen recently at his gastroenterologist for a screening colonoscopy and due to his dyspnea was recommended to see pulmonology for evaluation prior to getting a colonoscopy. Patient says he has intermittent dry cough and occasional wheezing. Patient was seen in 2013 by Dr. Chase Caller for shortness of breath. During that evaluation, he was felt not to have  COPD. Was felt that his dyspnea was related to obesity. Pt says he gets winded with walking . Wears out of breath. Unable to do yard work. No chest pain, calf pain or hemoptysis . Does have chronic ankle edema. No flare of swelling . On HCTZ daily. He is followed by Cardiology for CAD s/p Stents . Says he was seen last week by cards . No records available. FENO testing today 7ppb   No Known Allergies  OV 04/08/2017  Chief Complaint  Patient presents with  . Follow-up    PFT done today. Pt c/o SOB that comes and goes and dry cough. Denies any CP.    72 year old male that I personally have not seen in 5 years.At that time he had shortness of breath that labeled as down to obesity and physical deconditioning and associated diastolic dysfunction. In the interim he has been following up with Dr. Halford Chessman for sleep apnea. He recently saw my nurse for additional colonoscopy clearance and at that time he reported that his shortness of breath is significantly worsen the last 5 years. It is present for minimal exertion. He is also reporting choking episodes at night despite using his CPAP. She also pulmonary function test which I reviewed shows restriction with significant bronchodilator response and a slightly low DLCO 68%. He also reports some wheezing with exhaled nitric oxide was normal. He  is a previous 105 pack smoker   05/06/2017 Follow up: Dyspnea/COPD/Asthma /OSA  Pt returns for 1 month follow up . He was seen last office visit with dyspnea that has been getting worse over last 6-12 month. Had some mild intermittent dry cough and wheezing. PFT  Severe restriction with significant BD response. DLCO was decreased. He was set up for a HRCT chest that was neg for ILD. Showed linear scarring in lower lobes. Echo showed EF 60-65%. Mod LA dilation . PAP 36 mmHg. He was started on BREO . He says he might have some improvement in dyspnea but still gets winded with walking and bending over to put on shoes.   Denies chest pain , orthopnea , increased edema.  He has chronic ankle edema , takes HCTZ daily.   Has very severe OSA but still not wearing BIPAP . We discussed importance of BIPAP compliance. Says he feels mask is blowing to hard on him.  Pt education on OSA .   OV 10/19/2017  Chief Complaint  Patient presents with  . Follow-up    Pt states he has been doing good since last visit. Pt states he still has SOB. Denies any cough or CP.   Follow-up multifactorial dyspnea secondary to pulmonary function test suggestive of emphysema with isolated reduction diffusion capacity, morbid obesity, diastolic dysfunction and sleep apnea with some cor pulmonale features such as chronic edema and mildly elevated pulmonary artery systolic pressure on echo  He is now compliant with his CPAP at night/BiPAP.  This is significantly improved his dyspnea.  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  He is not taking his Brio inhaler because this did not help him much and he started having some intolerance to this.  We discussed pulmonary rehabilitation but he is not interested.  He is willing to try an alternative Spiriva inhaler to see if he can have further improvement with his dyspnea but overall he still does feel very good no cough.  Chronic edema remains.     has a past medical history of Arthritis, Blind left eye, CAD (coronary artery disease), Complication of anesthesia, COPD (chronic obstructive pulmonary disease) ([place]), Coronary artery disease, GERD (gastroesophageal reflux disease), Gout, History of bleeding ulcers, History of gout, History of peptic ulcer disease, Hyperlipidemia, Hypertension, Hypertensive heart disease without CHF, Kidney stones, Lumbar disc disease, Myocardial infarction ([place]) (1995; 2007), Obesity, OSA (obstructive sleep apnea), OSA on CPAP, Pneumonia, Pneumonia (X 2), Prostate cancer ([place]), Pulmonary nodule, and Second degree Mobitz I AV block.   reports that he quit smoking  about 34 years ago. His smoking use included cigarettes. He has a 105.00 pack-year smoking history. He has never used smokeless tobacco.  Past Surgical History:  Procedure Laterality Date  . APPENDECTOMY    . APPENDECTOMY  ~ 1985  . BACK SURGERY    . CARDIAC CATHETERIZATION N/A 02/08/2015   Procedure: Left Heart Cath and Coronary Angiography;  Surgeon: Leonie Man, MD;  Location: [facility];  Service: Cardiovascular;  Laterality: N/A;  . CARDIAC CATHETERIZATION N/A 02/08/2015   Procedure: Coronary Stent Intervention;  Surgeon: Leonie Man, MD;  Location: [facility];  Service: Cardiovascular;  Laterality: N/A;  . CHOLECYSTECTOMY    . CORONARY ANGIOPLASTY WITH STENT PLACEMENT  ~ 1995; 2007   "1 stent; 2 stents"  . LAPAROSCOPIC CHOLECYSTECTOMY    . LITHOTRIPSY  X 1  . LUMBAR LAMINECTOMY    . POSTERIOR LUMBAR FUSION  2003?  . PROSTATE BIOPSY  2015    No Known Allergies  Immunization History  Administered Date(s) Administered  . Influenza Whole 03/19/2009, 04/17/2010, 03/17/2011  . Influenza-Unspecified 03/16/2014, 02/07/2015, 03/09/2017  . Pneumococcal Conjugate-13 05/06/2016  . Pneumococcal Polysaccharide-23 03/19/2009    Family History  Problem Relation Age of Onset  . Hypertension Mother   . Coronary artery disease Mother   . Aneurysm Father   . Hypertension Father   . COPD Sister   . Sleep apnea Unknown        3 siblings     Current Outpatient Medications:  .  acetaminophen (TYLENOL) 500 MG tablet, Take 500 mg by mouth every 6 (six) hours as needed for moderate pain., Disp: , Rfl:  .  allopurinol (ZYLOPRIM) 300 MG tablet, Take 300 mg by mouth Daily. , Disp: , Rfl:  .  amLODipine (NORVASC) 10 MG tablet, Take 10 mg by mouth Daily. , Disp: , Rfl:  .  aspirin EC 81 MG EC tablet, Take 1 tablet (81 mg total) by mouth daily., Disp: , Rfl:  .  atorvastatin (LIPITOR) 20 MG tablet, , Disp: , Rfl:  .  hydrochlorothiazide (HYDRODIURIL) 25 MG tablet, Take 25  mg by mouth daily., Disp: , Rfl:  .  losartan (COZAAR) 100 MG tablet, Take 100 mg by mouth daily., Disp: , Rfl:  .  pantoprazole (PROTONIX) 40 MG tablet, , Disp: , Rfl:  .  diphenhydrAMINE (BENADRYL) 25 mg capsule, Take 50-75 mg by mouth daily as needed (for hayfever)., Disp: , Rfl:  .  fluticasone furoate-vilanterol (BREO ELLIPTA) 100-25 MCG/INH AEPB, Inhale 1 puff into the lungs daily. (Patient not taking: Reported on 10/19/2017), Disp: 1 each, Rfl: 5 .  nitroGLYCERIN (NITROSTAT) 0.4 MG SL tablet, Place 1 tablet (0.4 mg total) under the tongue every 5 (five) minutes x 3 doses as needed for chest pain. (Patient not taking: Reported on 10/19/2017), Disp: 25 tablet, Rfl: 12   Review of Systems     Objective:   Physical Exam Vitals:   10/19/17 0857  BP: 128/80  Pulse: (!) 42  SpO2: 96%  Weight: (!) 314 lb 12.8 oz (142.8 kg)  Height: 6\' 2"  (1.88 m)    Estimated body mass index is 40.42 kg/m as calculated from the following:   Height as of this encounter: 6\' 2"  (1.88 m).   Weight as of this encounter: 314 lb 12.8 oz (142.8 kg).   General Appearance:    OBESE - +++  Head:    Normocephalic, without obvious abnormality, atraumatic  Eyes:    PERRL - yes, conjunctiva/corneas - clear      Ears:    Normal external ear canals, both ears  Nose:   NG tube - no  Throat:  ETT TUBE - no , OG tube - no  Neck:   Supple,  No enlargement/tenderness/nodules     Lungs:     Clear to auscultation bilaterally,   Chest wall:    No deformity  Heart:    S1 and S2 normal, no murmur, CVP - no.  Pressors - no  Abdomen:     Soft, no masses, no organomegaly  Genitalia:    Not done  Rectal:   not done  Extremities:   Extremities- R > L chronic edema with RLL varicose     Skin:   Intact in exposed areas . Sacral area - ni     Neurologic:   Sedation - none -> RASS - na . Moves all 4s -  yes. CAM-ICU - neg . Orientation - x 3+          Assessment:       ICD-10-CM   1. Chronic obstructive pulmonary  disease, unspecified COPD type ([place]) J44.9   2. Dyspnea, unspecified type R06.00        Plan:      Extremely glad that you are feeling better after CPAP compliance at night I understand that Brio inhaler did not help your shortness of breath and was giving it some side effects You can try Spiriva Respimat once daily 2 puffs on a scheduled basis to see if this will help you We discussed pulmonary rehabilitation and agreed that it does not work for you schedule  Follow-up 6 months or sooner if needed    Dr. Brand Males, M.D., [facility].C.P Pulmonary and Critical Care Medicine Staff Physician, [place] Director - Interstitial Lung Disease  Program  Pulmonary [place] [facility] [place], [place], [address]  Pager: [phone], If no answer or between  15:00h - 7:00h: call [phone] Telephone: [phone]

## 2017-10-19 NOTE — Addendum Note (Signed)
Addended by: Lorretta Harp on: 10/19/2017 09:24 AM   Modules accepted: Orders

## 2017-10-19 NOTE — Patient Instructions (Addendum)
    ICD-10-CM   1. Chronic obstructive pulmonary disease, unspecified COPD type ([place]) J44.9   2. Dyspnea, unspecified type R06.00    Extremely glad that you are feeling better after CPAP compliance at night I understand that Brio inhaler did not help your shortness of breath and was giving it some side effects You can try Spiriva Respimat once daily 2 puffs on a scheduled basis to see if this will help you We discussed pulmonary rehabilitation and agreed that it does not work for you schedule  Follow-up 6 months or sooner if needed

## 2017-10-23 DIAGNOSIS — I2510 Atherosclerotic heart disease of native coronary artery without angina pectoris: Secondary | ICD-10-CM | POA: Diagnosis not present

## 2017-10-23 DIAGNOSIS — I119 Hypertensive heart disease without heart failure: Secondary | ICD-10-CM | POA: Diagnosis not present

## 2017-10-23 DIAGNOSIS — Z79899 Other long term (current) drug therapy: Secondary | ICD-10-CM | POA: Diagnosis not present

## 2017-10-23 DIAGNOSIS — E785 Hyperlipidemia, unspecified: Secondary | ICD-10-CM | POA: Diagnosis not present

## 2017-10-23 DIAGNOSIS — G4733 Obstructive sleep apnea (adult) (pediatric): Secondary | ICD-10-CM | POA: Diagnosis not present

## 2017-10-23 DIAGNOSIS — Z8711 Personal history of peptic ulcer disease: Secondary | ICD-10-CM | POA: Diagnosis not present

## 2017-10-23 DIAGNOSIS — I441 Atrioventricular block, second degree: Secondary | ICD-10-CM | POA: Diagnosis not present

## 2017-10-23 DIAGNOSIS — Z01810 Encounter for preprocedural cardiovascular examination: Secondary | ICD-10-CM | POA: Diagnosis not present

## 2017-10-27 DIAGNOSIS — C61 Malignant neoplasm of prostate: Secondary | ICD-10-CM | POA: Diagnosis not present

## 2017-11-01 ENCOUNTER — Emergency Department (HOSPITAL_COMMUNITY): Payer: Medicare HMO

## 2017-11-01 ENCOUNTER — Inpatient Hospital Stay (HOSPITAL_COMMUNITY)
Admission: EM | Admit: 2017-11-01 | Discharge: 2017-11-11 | DRG: 493 | Disposition: A | Discharge status: Skilled Nursing Facility | Payer: Medicare HMO | Attending: Internal Medicine | Admitting: Internal Medicine

## 2017-11-01 ENCOUNTER — Encounter (HOSPITAL_COMMUNITY): Payer: Self-pay | Admitting: Emergency Medicine

## 2017-11-01 DIAGNOSIS — D62 Acute posthemorrhagic anemia: Secondary | ICD-10-CM | POA: Diagnosis not present

## 2017-11-01 DIAGNOSIS — J449 Chronic obstructive pulmonary disease, unspecified: Secondary | ICD-10-CM | POA: Diagnosis not present

## 2017-11-01 DIAGNOSIS — W1830XA Fall on same level, unspecified, initial encounter: Secondary | ICD-10-CM | POA: Diagnosis present

## 2017-11-01 DIAGNOSIS — Z825 Family history of asthma and other chronic lower respiratory diseases: Secondary | ICD-10-CM

## 2017-11-01 DIAGNOSIS — Z01811 Encounter for preprocedural respiratory examination: Secondary | ICD-10-CM | POA: Diagnosis not present

## 2017-11-01 DIAGNOSIS — Z743 Need for continuous supervision: Secondary | ICD-10-CM | POA: Diagnosis not present

## 2017-11-01 DIAGNOSIS — S82201D Unspecified fracture of shaft of right tibia, subsequent encounter for closed fracture with routine healing: Secondary | ICD-10-CM | POA: Diagnosis not present

## 2017-11-01 DIAGNOSIS — S82131A Displaced fracture of medial condyle of right tibia, initial encounter for closed fracture: Secondary | ICD-10-CM | POA: Diagnosis not present

## 2017-11-01 DIAGNOSIS — R55 Syncope and collapse: Secondary | ICD-10-CM | POA: Diagnosis not present

## 2017-11-01 DIAGNOSIS — J45909 Unspecified asthma, uncomplicated: Secondary | ICD-10-CM | POA: Diagnosis not present

## 2017-11-01 DIAGNOSIS — I1310 Hypertensive heart and chronic kidney disease without heart failure, with stage 1 through stage 4 chronic kidney disease, or unspecified chronic kidney disease: Secondary | ICD-10-CM | POA: Diagnosis present

## 2017-11-01 DIAGNOSIS — Z87442 Personal history of urinary calculi: Secondary | ICD-10-CM

## 2017-11-01 DIAGNOSIS — N183 Chronic kidney disease, stage 3 (moderate): Secondary | ICD-10-CM | POA: Diagnosis not present

## 2017-11-01 DIAGNOSIS — I440 Atrioventricular block, first degree: Secondary | ICD-10-CM | POA: Diagnosis present

## 2017-11-01 DIAGNOSIS — J9611 Chronic respiratory failure with hypoxia: Secondary | ICD-10-CM | POA: Diagnosis present

## 2017-11-01 DIAGNOSIS — Z8546 Personal history of malignant neoplasm of prostate: Secondary | ICD-10-CM | POA: Diagnosis not present

## 2017-11-01 DIAGNOSIS — S82142A Displaced bicondylar fracture of left tibia, initial encounter for closed fracture: Secondary | ICD-10-CM

## 2017-11-01 DIAGNOSIS — S82141A Displaced bicondylar fracture of right tibia, initial encounter for closed fracture: Secondary | ICD-10-CM | POA: Diagnosis not present

## 2017-11-01 DIAGNOSIS — S82121D Displaced fracture of lateral condyle of right tibia, subsequent encounter for closed fracture with routine healing: Secondary | ICD-10-CM | POA: Diagnosis not present

## 2017-11-01 DIAGNOSIS — Z7982 Long term (current) use of aspirin: Secondary | ICD-10-CM | POA: Diagnosis not present

## 2017-11-01 DIAGNOSIS — S82131D Displaced fracture of medial condyle of right tibia, subsequent encounter for closed fracture with routine healing: Secondary | ICD-10-CM | POA: Diagnosis not present

## 2017-11-01 DIAGNOSIS — J9811 Atelectasis: Secondary | ICD-10-CM | POA: Diagnosis not present

## 2017-11-01 DIAGNOSIS — Z01810 Encounter for preprocedural cardiovascular examination: Secondary | ICD-10-CM | POA: Diagnosis not present

## 2017-11-01 DIAGNOSIS — Z955 Presence of coronary angioplasty implant and graft: Secondary | ICD-10-CM

## 2017-11-01 DIAGNOSIS — Z8249 Family history of ischemic heart disease and other diseases of the circulatory system: Secondary | ICD-10-CM | POA: Diagnosis not present

## 2017-11-01 DIAGNOSIS — E785 Hyperlipidemia, unspecified: Secondary | ICD-10-CM | POA: Diagnosis not present

## 2017-11-01 DIAGNOSIS — Z981 Arthrodesis status: Secondary | ICD-10-CM

## 2017-11-01 DIAGNOSIS — S82201A Unspecified fracture of shaft of right tibia, initial encounter for closed fracture: Secondary | ICD-10-CM | POA: Diagnosis not present

## 2017-11-01 DIAGNOSIS — M25561 Pain in right knee: Secondary | ICD-10-CM | POA: Diagnosis not present

## 2017-11-01 DIAGNOSIS — I119 Hypertensive heart disease without heart failure: Secondary | ICD-10-CM | POA: Diagnosis not present

## 2017-11-01 DIAGNOSIS — I441 Atrioventricular block, second degree: Secondary | ICD-10-CM

## 2017-11-01 DIAGNOSIS — Y9222 Religious institution as the place of occurrence of the external cause: Secondary | ICD-10-CM | POA: Diagnosis not present

## 2017-11-01 DIAGNOSIS — I2510 Atherosclerotic heart disease of native coronary artery without angina pectoris: Secondary | ICD-10-CM | POA: Diagnosis not present

## 2017-11-01 DIAGNOSIS — Z87891 Personal history of nicotine dependence: Secondary | ICD-10-CM

## 2017-11-01 DIAGNOSIS — G4733 Obstructive sleep apnea (adult) (pediatric): Secondary | ICD-10-CM

## 2017-11-01 DIAGNOSIS — Z419 Encounter for procedure for purposes other than remedying health state, unspecified: Secondary | ICD-10-CM

## 2017-11-01 DIAGNOSIS — J9610 Chronic respiratory failure, unspecified whether with hypoxia or hypercapnia: Secondary | ICD-10-CM | POA: Diagnosis not present

## 2017-11-01 DIAGNOSIS — M898X9 Other specified disorders of bone, unspecified site: Secondary | ICD-10-CM | POA: Diagnosis present

## 2017-11-01 DIAGNOSIS — I10 Essential (primary) hypertension: Secondary | ICD-10-CM | POA: Diagnosis not present

## 2017-11-01 DIAGNOSIS — H5462 Unqualified visual loss, left eye, normal vision right eye: Secondary | ICD-10-CM | POA: Diagnosis present

## 2017-11-01 DIAGNOSIS — R279 Unspecified lack of coordination: Secondary | ICD-10-CM | POA: Diagnosis not present

## 2017-11-01 DIAGNOSIS — M19012 Primary osteoarthritis, left shoulder: Secondary | ICD-10-CM | POA: Diagnosis present

## 2017-11-01 DIAGNOSIS — M519 Unspecified thoracic, thoracolumbar and lumbosacral intervertebral disc disorder: Secondary | ICD-10-CM | POA: Diagnosis present

## 2017-11-01 DIAGNOSIS — K219 Gastro-esophageal reflux disease without esophagitis: Secondary | ICD-10-CM | POA: Diagnosis present

## 2017-11-01 DIAGNOSIS — Z9049 Acquired absence of other specified parts of digestive tract: Secondary | ICD-10-CM

## 2017-11-01 DIAGNOSIS — Z8711 Personal history of peptic ulcer disease: Secondary | ICD-10-CM

## 2017-11-01 DIAGNOSIS — M6281 Muscle weakness (generalized): Secondary | ICD-10-CM | POA: Diagnosis not present

## 2017-11-01 DIAGNOSIS — F419 Anxiety disorder, unspecified: Secondary | ICD-10-CM | POA: Diagnosis present

## 2017-11-01 DIAGNOSIS — S82121A Displaced fracture of lateral condyle of right tibia, initial encounter for closed fracture: Secondary | ICD-10-CM | POA: Diagnosis not present

## 2017-11-01 DIAGNOSIS — Z6841 Body Mass Index (BMI) 40.0 and over, adult: Secondary | ICD-10-CM | POA: Diagnosis not present

## 2017-11-01 DIAGNOSIS — I252 Old myocardial infarction: Secondary | ICD-10-CM

## 2017-11-01 DIAGNOSIS — Z9981 Dependence on supplemental oxygen: Secondary | ICD-10-CM

## 2017-11-01 DIAGNOSIS — M109 Gout, unspecified: Secondary | ICD-10-CM | POA: Diagnosis present

## 2017-11-01 DIAGNOSIS — K5904 Chronic idiopathic constipation: Secondary | ICD-10-CM | POA: Diagnosis not present

## 2017-11-01 DIAGNOSIS — E559 Vitamin D deficiency, unspecified: Secondary | ICD-10-CM | POA: Diagnosis present

## 2017-11-01 DIAGNOSIS — K5909 Other constipation: Secondary | ICD-10-CM | POA: Diagnosis present

## 2017-11-01 DIAGNOSIS — R911 Solitary pulmonary nodule: Secondary | ICD-10-CM | POA: Diagnosis present

## 2017-11-01 HISTORY — DX: Personal history of nicotine dependence: Z87.891

## 2017-11-01 HISTORY — DX: Displaced fracture of lateral condyle of right tibia, initial encounter for closed fracture: S82.121A

## 2017-11-01 LAB — CBC WITH DIFFERENTIAL/PLATELET
BASOS ABS: 0 10*3/uL (ref 0.0–0.1)
Basophils Relative: 0 %
EOS PCT: 1 %
Eosinophils Absolute: 0.2 10*3/uL (ref 0.0–0.7)
HEMATOCRIT: 47.9 % (ref 39.0–52.0)
Hemoglobin: 16.5 g/dL (ref 13.0–17.0)
LYMPHS ABS: 2.5 10*3/uL (ref 0.7–4.0)
LYMPHS PCT: 19 %
MCH: 30.9 pg (ref 26.0–34.0)
MCHC: 34.4 g/dL (ref 30.0–36.0)
MCV: 89.7 fL (ref 78.0–100.0)
MONO ABS: 0.8 10*3/uL (ref 0.1–1.0)
Monocytes Relative: 6 %
NEUTROS ABS: 9.8 10*3/uL — AB (ref 1.7–7.7)
Neutrophils Relative %: 74 %
Platelets: 218 10*3/uL (ref 150–400)
RBC: 5.34 MIL/uL (ref 4.22–5.81)
RDW: 13.9 % (ref 11.5–15.5)
WBC: 13.3 10*3/uL — ABNORMAL HIGH (ref 4.0–10.5)

## 2017-11-01 LAB — COMPREHENSIVE METABOLIC PANEL
ALBUMIN: 4 g/dL (ref 3.5–5.0)
ALT: 26 U/L (ref 17–63)
ANION GAP: 11 (ref 5–15)
AST: 26 U/L (ref 15–41)
Alkaline Phosphatase: 60 U/L (ref 38–126)
BILIRUBIN TOTAL: 1 mg/dL (ref 0.3–1.2)
BUN: 21 mg/dL — AB (ref 6–20)
CHLORIDE: 104 mmol/L (ref 101–111)
CO2: 27 mmol/L (ref 22–32)
Calcium: 10.7 mg/dL — ABNORMAL HIGH (ref 8.9–10.3)
Creatinine, Ser: 1.39 mg/dL — ABNORMAL HIGH (ref 0.61–1.24)
GFR calc Af Amer: 56 mL/min — ABNORMAL LOW (ref >60)
GFR calc non Af Amer: 49 mL/min — ABNORMAL LOW (ref >60)
GLUCOSE: 119 mg/dL — AB (ref 65–99)
POTASSIUM: 4.1 mmol/L (ref 3.5–5.1)
Sodium: 142 mmol/L (ref 135–145)
TOTAL PROTEIN: 7.9 g/dL (ref 6.5–8.1)

## 2017-11-01 LAB — I-STAT TROPONIN, ED: Troponin i, poc: 0.01 ng/mL (ref 0.00–0.08)

## 2017-11-01 MED ORDER — HYDROCODONE-ACETAMINOPHEN 5-325 MG PO TABS
1.0000 | ORAL_TABLET | Freq: Four times a day (QID) | ORAL | Status: DC | PRN
  Administered 2017-11-03 (×2): 1 via ORAL
  Administered 2017-11-03 – 2017-11-04 (×4): 2 via ORAL
  Filled 2017-11-01 (×3): qty 2
  Filled 2017-11-01: qty 1
  Filled 2017-11-01 (×2): qty 2
  Filled 2017-11-01: qty 1

## 2017-11-01 MED ORDER — MORPHINE SULFATE (PF) 2 MG/ML IV SOLN
0.5000 mg | INTRAVENOUS | Status: DC | PRN
  Administered 2017-11-02 (×4): 0.5 mg via INTRAVENOUS
  Filled 2017-11-01 (×5): qty 1

## 2017-11-01 MED ORDER — PANTOPRAZOLE SODIUM 40 MG PO TBEC
40.0000 mg | DELAYED_RELEASE_TABLET | Freq: Every day | ORAL | Status: DC
  Administered 2017-11-02 – 2017-11-11 (×9): 40 mg via ORAL
  Filled 2017-11-01 (×9): qty 1

## 2017-11-01 MED ORDER — HYDROCHLOROTHIAZIDE 25 MG PO TABS
25.0000 mg | ORAL_TABLET | Freq: Every day | ORAL | Status: DC
  Administered 2017-11-02 – 2017-11-04 (×3): 25 mg via ORAL
  Filled 2017-11-01 (×3): qty 1

## 2017-11-01 MED ORDER — LOSARTAN POTASSIUM 50 MG PO TABS
100.0000 mg | ORAL_TABLET | Freq: Every day | ORAL | Status: DC
  Administered 2017-11-02 – 2017-11-04 (×3): 100 mg via ORAL
  Filled 2017-11-01 (×3): qty 2

## 2017-11-01 MED ORDER — FENTANYL CITRATE (PF) 100 MCG/2ML IJ SOLN
25.0000 ug | Freq: Once | INTRAMUSCULAR | Status: AC
  Administered 2017-11-01: 50 ug via INTRAVENOUS
  Filled 2017-11-01: qty 2

## 2017-11-01 MED ORDER — AMLODIPINE BESYLATE 10 MG PO TABS
10.0000 mg | ORAL_TABLET | Freq: Every day | ORAL | Status: DC
  Administered 2017-11-02 – 2017-11-11 (×9): 10 mg via ORAL
  Filled 2017-11-01 (×2): qty 1
  Filled 2017-11-01: qty 2
  Filled 2017-11-01 (×7): qty 1

## 2017-11-01 MED ORDER — HEPARIN SODIUM (PORCINE) 5000 UNIT/ML IJ SOLN
5000.0000 [IU] | Freq: Three times a day (TID) | INTRAMUSCULAR | Status: DC
  Administered 2017-11-02 – 2017-11-03 (×5): 5000 [IU] via SUBCUTANEOUS
  Filled 2017-11-01 (×8): qty 1

## 2017-11-01 MED ORDER — ALLOPURINOL 300 MG PO TABS
300.0000 mg | ORAL_TABLET | Freq: Every day | ORAL | Status: DC
  Administered 2017-11-02 – 2017-11-11 (×9): 300 mg via ORAL
  Filled 2017-11-01 (×9): qty 1

## 2017-11-01 MED ORDER — HYDROMORPHONE HCL 1 MG/ML IJ SOLN
1.0000 mg | Freq: Once | INTRAMUSCULAR | Status: AC
  Administered 2017-11-01: 1 mg via INTRAVENOUS
  Filled 2017-11-01: qty 1

## 2017-11-01 NOTE — ED Notes (Signed)
Bed: WA04 Expected date:  Expected time:  Means of arrival:  Comments: 

## 2017-11-01 NOTE — ED Provider Notes (Signed)
[facility] DEPT Provider Note   CSN: [id number] Arrival date & time: 11/01/17  1425     History   Chief Complaint Chief Complaint  Patient presents with  . Fall  . Knee Pain    HPI [name] is a 72 y.o. male with a past medical history of morbid obesity, COPD, CAD with 4 stents, OSA, hypertension, hyperlipidemia, who presents today for evaluation of right knee pain.  He reports that he was eating church when suddenly he felt weak and fell to the ground.  He caught himself on the bumper of a truck in front of him.  He reports continued right knee pain and tingling since the fall today.  He denies striking his head, no loss of consciousness.  He reports that the knee down still feels tingly and different, however is not numb.    He reports that he is breathing at his normal baseline, does not feel short of breath, no chest pain, Or headache.  He denies any other injuries.  HPI  Past Medical History:  Diagnosis Date  . Arthritis    "left shoulder" (02/09/2015)  . Blind left eye   . CAD (coronary artery disease)   . Complication of anesthesia    "they give me too much anesthesia & had to put me on life support for a little bit w/gallbladder OR"  . COPD (chronic obstructive pulmonary disease) ([place])   . Coronary artery disease   . GERD (gastroesophageal reflux disease)   . Gout   . History of bleeding ulcers   . History of gout   . History of peptic ulcer disease   . Hyperlipidemia   . Hypertension   . Hypertensive heart disease without CHF   . Kidney stones   . Lumbar disc disease   . Myocardial infarction [facility]) 1995; 2007  . Obesity   . OSA (obstructive sleep apnea)   . OSA on CPAP   . Pneumonia   . Pneumonia X 2  . Prostate cancer ([place])   . Pulmonary nodule   . Second degree Mobitz I AV block     Patient Active Problem List   Diagnosis Date Noted  . Right medial tibial plateau fracture 11/01/2017  . COPD (chronic obstructive  pulmonary disease) ([place]) 05/06/2017  . Dyspnea 03/24/2017  . Unstable angina pectoris ([place]) 02/07/2015  . History of peptic ulcer disease   . Lumbar disc disease   . Second degree Mobitz I AV block   . Hyperlipidemia 09/21/2014  . Pulmonary nodule   . OSA (obstructive sleep apnea) 04/12/2009  . Obesity (BMI 30-39.9)   . CAD (coronary artery disease) 04/11/2009  . Hypertensive heart disease without CHF   . GERD   . CAD S/P percutaneous coronary angioplasty: BMS to Cx 1998, BMS to pLAD & dCx-OM 2007 02/07/2006    Past Surgical History:  Procedure Laterality Date  . APPENDECTOMY    . APPENDECTOMY  ~ 1985  . BACK SURGERY    . CARDIAC CATHETERIZATION N/A 02/08/2015   Procedure: Left Heart Cath and Coronary Angiography;  Surgeon: Billy Man, MD;  Location: [facility];  Service: Cardiovascular;  Laterality: N/A;  . CARDIAC CATHETERIZATION N/A 02/08/2015   Procedure: Coronary Stent Intervention;  Surgeon: Billy Man, MD;  Location: [facility];  Service: Cardiovascular;  Laterality: N/A;  . CHOLECYSTECTOMY    . CORONARY ANGIOPLASTY WITH STENT PLACEMENT  ~ 1995; 2007   "1 stent; 2 stents"  .  LAPAROSCOPIC CHOLECYSTECTOMY    . LITHOTRIPSY  X 1  . LUMBAR LAMINECTOMY    . POSTERIOR LUMBAR FUSION  2003?  . PROSTATE BIOPSY  2015        Home Medications    Prior to Admission medications   Medication Sig Start Date End Date Taking? Authorizing Provider  acetaminophen (TYLENOL) 500 MG tablet Take 500 mg by mouth every 6 (six) hours as needed for moderate pain.   Yes [provider]  allopurinol (ZYLOPRIM) 300 MG tablet Take 300 mg by mouth Daily.  11/07/11  Yes [provider]  amLODipine (NORVASC) 10 MG tablet Take 10 mg by mouth Daily.  11/07/11  Yes [provider]  aspirin EC 81 MG EC tablet Take 1 tablet (81 mg total) by mouth daily. 02/10/15  Yes Jacolyn Reedy, MD  hydrochlorothiazide (HYDRODIURIL) 25 MG tablet Take 25 mg by mouth  daily.   Yes [provider]  losartan (COZAAR) 100 MG tablet Take 100 mg by mouth daily.   Yes [provider]  naproxen sodium (ALEVE) 220 MG tablet Take 220 mg by mouth daily as needed (pain).   Yes [provider]  pantoprazole (PROTONIX) 40 MG tablet  03/10/17  Yes [provider]  nitroGLYCERIN (NITROSTAT) 0.4 MG SL tablet Place 1 tablet (0.4 mg total) under the tongue every 5 (five) minutes x 3 doses as needed for chest pain. Patient not taking: Reported on 10/19/2017 02/09/15   Jacolyn Reedy, MD  Tiotropium Bromide Monohydrate (SPIRIVA RESPIMAT) 1.25 MCG/ACT AERS Inhale 2 puffs into the lungs daily. Patient not taking: Reported on 11/01/2017 10/19/17   Brand Males, MD    Family History Family History  Problem Relation Age of Onset  . Hypertension Mother   . Coronary artery disease Mother   . Aneurysm Father   . Hypertension Father   . COPD Sister   . Sleep apnea Unknown        3 siblings    Social History Social History   Tobacco Use  . Smoking status: Former Smoker    Packs/day: 3.00    Years: 35.00    Pack years: 105.00    Types: Cigarettes    Last attempt to quit: 06/17/1983    Years since quitting: 34.4  . Smokeless tobacco: Never Used  . Tobacco comment: "quit smoking cigarettes in 1985  Substance Use Topics  . Alcohol use: Yes    Alcohol/week: 0.0 oz    Comment: "I drank a whole lot when I was younger; nothing since 1990"  . Drug use: No     Allergies   Patient has no known allergies.   Review of Systems Review of Systems  Constitutional: Negative for chills and fever.  HENT: Negative for ear pain and sore throat.   Eyes: Negative for pain and visual disturbance.  Respiratory: Negative for cough and shortness of breath.   Cardiovascular: Negative for chest pain and palpitations.  Gastrointestinal: Negative for abdominal pain, nausea and vomiting.  Genitourinary: Negative for dysuria and hematuria.    Musculoskeletal: Negative for arthralgias and back pain.       Pain in Right kee  Skin: Negative for color change and rash.  Neurological: Positive for weakness. Negative for seizures and syncope.  All other systems reviewed and are negative.    Physical Exam Updated Vital Signs BP (!) 129/102   Pulse 71   Temp 98.4 F (36.9 C) (Oral)   Resp (!) 25   Ht 6\' 1"  (  1.854 m)   Wt (!) 141.5 kg (312 lb)   SpO2 93%   BMI 41.16 kg/m   Physical Exam  Constitutional: He is oriented to person, place, and time. He appears well-developed and well-nourished.  HENT:  Head: Normocephalic and atraumatic.  Mouth/Throat: Oropharynx is clear and moist.  Eyes: Pupils are equal, round, and reactive to light. Conjunctivae are normal.  Neck: Normal range of motion. Neck supple.  Cardiovascular: Normal rate, regular rhythm and intact distal pulses.  No murmur heard. 2+ pitting edema bilaterally.  Pulmonary/Chest: Effort normal and breath sounds normal. No respiratory distress.  Abdominal: Soft. There is no tenderness.  Musculoskeletal: He exhibits no edema or deformity.  There is tenderness to palpation around the right knee.  Patient preferentially holds the right knee partially flexed.  Assessment of deformities is limited secondary to body habitus.  Any attempted range of motion provides increased pain.  Right upper and lower leg compartments are soft, easily compressible.  Neurological: He is alert and oriented to person, place, and time.  Sensation intact to bilateral lower extremities.  5/5 strength bilaterally ankle plantarflexion and dorsiflexion.  5/5 grip strength bilaterally.    Skin: Skin is warm and dry. He is not diaphoretic.  Psychiatric: He has a normal mood and affect.  Nursing note and vitals reviewed.    ED Treatments / Results  Labs (all labs ordered are listed, but only abnormal results are displayed) Labs Reviewed  COMPREHENSIVE METABOLIC PANEL - Abnormal; Notable for the  following components:      Result Value   Glucose, Bld 119 (*)    BUN 21 (*)    Creatinine, Ser 1.39 (*)    Calcium 10.7 (*)    GFR calc non Af Amer 49 (*)    GFR calc Af Amer 56 (*)    All other components within normal limits  CBC WITH DIFFERENTIAL/PLATELET - Abnormal; Notable for the following components:   WBC 13.3 (*)    Neutro Abs 9.8 (*)    All other components within normal limits  CBC  CREATININE, SERUM  PROTIME-INR  I-STAT TROPONIN, ED    EKG EKG Interpretation  Date/Time:  Sunday Nov 01 2017 16:40:21 EDT Ventricular Rate:  53 PR Interval:    QRS Duration: 89 QT Interval:  390 QTC Calculation: 318 R Axis:   28 Text Interpretation:  Sinus rhythm with 2nd degree A-V block Nonspecific T wave abnormality Confirmed by Lajean Saver [phone]) on 11/01/2017 5:48:55 PM   Radiology Ct Knee Right Wo Contrast  Result Date: 11/01/2017 CLINICAL DATA:  Knee fracture EXAM: CT OF THE right KNEE WITHOUT CONTRAST TECHNIQUE: Multidetector CT imaging of the right knee was performed according to the standard protocol. Multiplanar CT image reconstructions were also generated. COMPARISON:  Radiograph 11/01/2017 FINDINGS: Bones/Joint/Cartilage Acute comminuted fracture involving the lateral plateau of the tibia with approximately 9 mm of central depression. Fracture lucency extends to the intercondylar region of the proximal tibia and the posterior articular surface of the proximal tibia. No lucency evident within the medial plateau of the tibia. Fibular head appears intact. No distal femoral fracture. No patellar fracture. Moderate hemarthrosis. Moderate arthritis of the medial knee joint with subchondral sclerosis and moderate narrowing. Mild spurring. Mild patellofemoral degenerative change with superior and inferior patellar spurring. Ligaments Suboptimally assessed by CT. Muscles and Tendons Mild muscular atrophy. Quadriceps tendon and patellar tendon appear grossly intact. Soft tissues Rim  calcified lucent lesion within the popliteal fossa, may relate to dystrophic calcification or old  trauma. Generalized soft tissue swelling. IMPRESSION: 1. Acute comminuted lateral tibial plateau fracture with about 9 mm central depression and extension of fracture lucency to the inter spinous region of the proximal tibia. Associated moderate hemarthrosis. 2. No other acute osseous abnormality seen. Moderate arthritis of the knee at the medial compartment. Electronically Signed   By: Donavan Foil M.D.   On: 11/01/2017 20:04   Dg Chest Port 1 View  Result Date: 11/01/2017 CLINICAL DATA:  Near syncope. EXAM: PORTABLE CHEST 1 VIEW COMPARISON:  03/24/2017 and prior radiograph FINDINGS: Cardiomegaly noted. There is no evidence of focal airspace disease, pulmonary edema, suspicious pulmonary nodule/mass, pleural effusion, or pneumothorax. No acute bony abnormalities are identified. IMPRESSION: Cardiomegaly without evidence of acute cardiopulmonary disease. Electronically Signed   By: Margarette Canada M.D.   On: 11/01/2017 18:21   Dg Knee Complete 4 Views Right  Result Date: 11/01/2017 CLINICAL DATA:  Right knee pain after fall. EXAM: RIGHT KNEE - COMPLETE 4+ VIEW COMPARISON:  Right knee x-rays dated November 19, 2016. FINDINGS: Acute, mildly depressed fracture of the lateral tibial plateau. No large joint effusion. Mild tricompartmental osteoarthritis. Unchanged large 3.4 cm calcified loose body likely within a Baker's cyst. Osteopenia. Soft tissues are unremarkable. IMPRESSION: 1. Acute, mildly depressed fracture of the lateral tibial plateau. Electronically Signed   By: Titus Dubin M.D.   On: 11/01/2017 15:28    Procedures Procedures (including critical care time)  Medications Ordered in ED Medications  allopurinol (ZYLOPRIM) tablet 300 mg (has no administration in time range)  amLODipine (NORVASC) tablet 10 mg (has no administration in time range)  hydrochlorothiazide (HYDRODIURIL) tablet 25 mg (has no  administration in time range)  losartan (COZAAR) tablet 100 mg (has no administration in time range)  pantoprazole (PROTONIX) EC tablet 40 mg (has no administration in time range)  HYDROcodone-acetaminophen (NORCO/VICODIN) 5-325 MG per tablet 1-2 tablet (has no administration in time range)  morphine 2 MG/ML injection 0.5 mg (has no administration in time range)  heparin injection 5,000 Units (has no administration in time range)  fentaNYL (SUBLIMAZE) injection 25 mcg (50 mcg Intravenous Given 11/01/17 1701)  HYDROmorphone (DILAUDID) injection 1 mg (1 mg Intravenous Given 11/01/17 1931)     Initial Impression / Assessment and Plan / ED Course  I have reviewed the triage vital signs and the nursing notes.  Pertinent labs & imaging results that were available during my care of the patient were reviewed by me and considered in my medical decision making (see chart for details).  Clinical Course as of Nov 01 2340  Sun Nov 01, 2017  1818 With Dr. Judee Clara from orthopedics, he recommends knee immobilizer, CT of the right knee, admission at [facility] to hospitalist.  He reports that he will see the patient in the morning.   [EH]  1953 Spoke with hospitalist who will [facility].    [EH]    Clinical Course User Index [EH] Lorin Glass, PA-C   Patient presents today for evaluation of a right leg weakness causing him to fall on his right knee.  He denies any headache, has no obvious weakness to bilateral ankle turn dorsiflexion.  He has pain in his right knee, x-rays were obtained showing a tibial plateau fracture.  Orthopedics was consulted who recommended CT, knee immobilizer, and admission to [facility].  EKG was obtained and reviewed without acute abnormalities.  Troponin not elevated.  Chest x-ray was obtained without pneumonia or other abnormal finding.  Labs were  obtained and reviewed, appear grossly consistent with patient's baseline.  Hospitalist was consulted for  admission who agreed to see patient.  After admission while in the department RN asked for someone to come and evaluate the patient, he was reportedly short of breath.  His lungs were clear to auscultation bilaterally, he reports that he was feeling claustrophobic, was laying down to sleep when he normally has to sleep in a recliner due to his sleep apnea.  Patient was reassured.  Right leg compartments were reassessed and remained easily compressible.  Instructed RN to notify hospitalist.   Final Clinical Impressions(s) / ED Diagnoses   Final diagnoses:  Closed fracture of medial portion of right tibial plateau, initial encounter  Second degree Mobitz I AV block  Chronic obstructive pulmonary disease, unspecified COPD type ([place])  OSA (obstructive sleep apnea)    ED Discharge Orders    None       Ollen Gross 11/01/17 2346    Lajean Saver, MD 11/03/17 [phone]

## 2017-11-01 NOTE — H&P (Signed)
History and Physical   [facility] [place] @ [facility] Admission History and Physical [employer], D.O.    Patient Name: [name] MR#: [id number] Date of Birth: 1945-12-06 Date of Admission: 11/01/2017  Referring MD/NP/PA: [facility] Primary Care Physician: Maury Dus, MD  Chief Complaint:  Chief Complaint  Patient presents with  . Fall  . Knee Pain    HPI: [name] is a 72 y.o. male with a known history of COPD, CAD s/p stents, OSA, HTN, HLD, 2nd degree Mobitz I AV block presents to the emergency department for evaluation of right knee pain.  Patient was in a usual state of health until this evening when his right leg became weak, sustained a fall landing on the ground on his right knee.  ER workup revealed right tibial plateau fracture. Only other complaint is chronic constipation which is relieved with Miralax.   Patient denies head trauma, LOC, fevers/chills, weakness, dizziness, chest pain, shortness of breath, N/V, abdominal pain, dysuria/frequency, changes in mental status.    Otherwise there has been no change in status. Patient has been taking medication as prescribed and there has been no recent change in medication or diet.  No recent antibiotics.  There has been no recent illness, hospitalizations, travel or sick contacts.    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 However he does experience shortness of breath with exertion.  He saw his cardiologist last week.  Of not he reports having had difficulty with anesthesia during one of his prior surgeries.    EMS/ED Course: Patient received fentanyl, dilaudid. Medical admission at [facility] has been requested by ortho for cardio evaluation and planned surgical repair.    Review of Systems:  CONSTITUTIONAL: No fever/chills, fatigue, weakness, weight gain/loss, headache. EYES: No blurry or double vision. ENT: No tinnitus, postnasal drip, redness or  soreness of the oropharynx. RESPIRATORY: No cough, dyspnea, wheeze.  No hemoptysis.  CARDIOVASCULAR: No chest pain, palpitations, syncope, orthopnea. No lower extremity edema.  GASTROINTESTINAL: Positive constipation. No nausea, vomiting, abdominal pain, diarrhea.  No hematemesis, melena or hematochezia. GENITOURINARY: No dysuria, frequency, hematuria. ENDOCRINE: No polyuria or nocturia. No heat or cold intolerance. HEMATOLOGY: No anemia, bruising, bleeding. INTEGUMENTARY: No rashes, ulcers, lesions. MUSCULOSKELETAL: Positive right knee pain. No arthritis, gout, dyspnea. NEUROLOGIC: No numbness, tingling, ataxia, seizure-type activity, weakness. PSYCHIATRIC: No anxiety, depression, insomnia.   Past Medical History:  Diagnosis Date  . Arthritis    "left shoulder" (02/09/2015)  . Blind left eye   . CAD (coronary artery disease)   . Complication of anesthesia    "they give me too much anesthesia & had to put me on life support for a little bit w/gallbladder OR"  . COPD (chronic obstructive pulmonary disease) ([place])   . Coronary artery disease   . GERD (gastroesophageal reflux disease)   . Gout   . History of bleeding ulcers   . History of gout   . History of peptic ulcer disease   . Hyperlipidemia   . Hypertension   . Hypertensive heart disease without CHF   . Kidney stones   . Lumbar disc disease   . Myocardial infarction [facility]) 1995; 2007  . Obesity   . OSA (obstructive sleep apnea)   . OSA on CPAP   . Pneumonia   . Pneumonia X 2  . Prostate cancer ([place])   . Pulmonary nodule   . Second degree Mobitz I AV block     Past Surgical History:  Procedure Laterality Date  . APPENDECTOMY    . APPENDECTOMY  ~ 1985  . BACK SURGERY    . CARDIAC CATHETERIZATION N/A 02/08/2015   Procedure: Left Heart Cath and Coronary Angiography;  Surgeon: Leonie Man, MD;  Location: [facility];  Service: Cardiovascular;  Laterality: N/A;  . CARDIAC CATHETERIZATION N/A 02/08/2015    Procedure: Coronary Stent Intervention;  Surgeon: Leonie Man, MD;  Location: [facility];  Service: Cardiovascular;  Laterality: N/A;  . CHOLECYSTECTOMY    . CORONARY ANGIOPLASTY WITH STENT PLACEMENT  ~ 1995; 2007   "1 stent; 2 stents"  . LAPAROSCOPIC CHOLECYSTECTOMY    . LITHOTRIPSY  X 1  . LUMBAR LAMINECTOMY    . POSTERIOR LUMBAR FUSION  2003?  . PROSTATE BIOPSY  2015     reports that he quit smoking about 34 years ago. His smoking use included cigarettes. He has a 105.00 pack-year smoking history. He has never used smokeless tobacco. He reports that he drinks alcohol. He reports that he does not use drugs.  No Known Allergies  Family History  Problem Relation Age of Onset  . Hypertension Mother   . Coronary artery disease Mother   . Aneurysm Father   . Hypertension Father   . COPD Sister   . Sleep apnea Unknown        3 siblings    Prior to Admission medications   Medication Sig Start Date End Date Taking? Authorizing Provider  acetaminophen (TYLENOL) 500 MG tablet Take 500 mg by mouth every 6 (six) hours as needed for moderate pain.   Yes [provider]  allopurinol (ZYLOPRIM) 300 MG tablet Take 300 mg by mouth Daily.  11/07/11  Yes [provider]  amLODipine (NORVASC) 10 MG tablet Take 10 mg by mouth Daily.  11/07/11  Yes [provider]  aspirin EC 81 MG EC tablet Take 1 tablet (81 mg total) by mouth daily. 02/10/15  Yes Jacolyn Reedy, MD  hydrochlorothiazide (HYDRODIURIL) 25 MG tablet Take 25 mg by mouth daily.   Yes [provider]  losartan (COZAAR) 100 MG tablet Take 100 mg by mouth daily.   Yes [provider]  naproxen sodium (ALEVE) 220 MG tablet Take 220 mg by mouth daily as needed (pain).   Yes [provider]  pantoprazole (PROTONIX) 40 MG tablet  03/10/17  Yes [provider]  nitroGLYCERIN (NITROSTAT) 0.4 MG SL tablet Place 1 tablet (0.4 mg total) under the tongue every 5 (five) minutes  x 3 doses as needed for chest pain. Patient not taking: Reported on 10/19/2017 02/09/15   Jacolyn Reedy, MD  Tiotropium Bromide Monohydrate (SPIRIVA RESPIMAT) 1.25 MCG/ACT AERS Inhale 2 puffs into the lungs daily. Patient not taking: Reported on 11/01/2017 10/19/17   Brand Males, MD    Physical Exam: Vitals:   11/01/17 1630 11/01/17 1730 11/01/17 1824 11/01/17 1830  BP: (!) 179/80 (!) 175/79 (!) 166/77 (!) 147/48  Pulse: (!) 126 (!) 125 (!) 49 (!) 35  Resp: (!) 23 19 (!) 21 (!) 23  Temp:      TempSrc:      SpO2: 97% 96% 96% 96%  Weight:      Height:        GENERAL: 72 y.o.-year-old male patient, well-developed, well-nourished lying in the bed in no acute distress.  Pleasant and cooperative.   HEENT: Head atraumatic, normocephalic. Pupils equal. Mucus membranes moist. NECK: Supple. No JVD. CHEST: Normal breath sounds bilaterally. No  wheezing, rales, rhonchi or crackles. No use of accessory muscles of respiration.  No reproducible chest wall tenderness.  CARDIOVASCULAR: S1, S2 normal. No murmurs, rubs, or gallops. Cap refill <2 seconds. Pulses intact distally.  ABDOMEN: Soft, protuberant, nondistended, nontender. No rebound, guarding, rigidity. Normoactive bowel sounds present in all four quadrants.  EXTREMITIES: Right knee splinted.  No pedal edema, cyanosis, or clubbing. No calf tenderness or Homan's sign.  NEUROLOGIC: The patient is alert and oriented x 3. Cranial nerves II through XII are grossly intact with no focal sensorimotor deficit. PSYCHIATRIC:  Normal affect, mood, thought content. SKIN: Warm, dry, and intact without obvious rash, lesion, or ulcer.    Labs on Admission:  CBC: Recent Labs  Lab 11/01/17 1702  WBC 13.3*  NEUTROABS 9.8*  HGB 16.5  HCT 47.9  MCV 89.7  PLT 381   Basic Metabolic Panel: Recent Labs  Lab 11/01/17 1702  NA 142  K 4.1  CL 104  CO2 27  GLUCOSE 119*  BUN 21*  CREATININE 1.39*  CALCIUM 10.7*   GFR: Estimated Creatinine  Clearance: 70 mL/min (A) (by C-G formula based on SCr of 1.39 mg/dL (H)). Liver Function Tests: Recent Labs  Lab 11/01/17 1702  AST 26  ALT 26  ALKPHOS 60  BILITOT 1.0  PROT 7.9  ALBUMIN 4.0   No results for input(s): LIPASE, AMYLASE in the last 168 hours. No results for input(s): AMMONIA in the last 168 hours. Coagulation Profile: No results for input(s): INR, PROTIME in the last 168 hours. Cardiac Enzymes: No results for input(s): CKTOTAL, CKMB, CKMBINDEX, TROPONINI in the last 168 hours. BNP (last 3 results) Recent Labs    03/24/17 1634  PROBNP 190.0*   HbA1C: No results for input(s): HGBA1C in the last 72 hours. CBG: No results for input(s): GLUCAP in the last 168 hours. Lipid Profile: No results for input(s): CHOL, HDL, LDLCALC, TRIG, CHOLHDL, LDLDIRECT in the last 72 hours. Thyroid Function Tests: No results for input(s): TSH, T4TOTAL, FREET4, T3FREE, THYROIDAB in the last 72 hours. Anemia Panel: No results for input(s): VITAMINB12, FOLATE, FERRITIN, TIBC, IRON, RETICCTPCT in the last 72 hours. Urine analysis:    Component Value Date/Time   COLORURINE YELLOW 03/24/2009 2110   APPEARANCEUR CLEAR 03/24/2009 2110   LABSPEC 1.030 03/24/2009 2110   PHURINE 5.0 03/24/2009 2110   GLUCOSEU NEGATIVE 03/24/2009 2110   HGBUR NEGATIVE 03/24/2009 2110   BILIRUBINUR SMALL (A) 03/24/2009 2110   KETONESUR NEGATIVE 03/24/2009 2110   PROTEINUR NEGATIVE 03/24/2009 2110   UROBILINOGEN 0.2 03/24/2009 2110   NITRITE NEGATIVE 03/24/2009 2110   LEUKOCYTESUR  03/24/2009 2110    NEGATIVE MICROSCOPIC NOT DONE ON URINES WITH NEGATIVE PROTEIN, BLOOD, LEUKOCYTES, NITRITE, OR GLUCOSE <1000 mg/dL.   Sepsis Labs: @LABRCNTIP (procalcitonin:4,lacticidven:4) )No results found for this or any previous visit (from the past 240 hour(s)).   Radiological Exams on Admission: Dg Chest Port 1 View  Result Date: 11/01/2017 CLINICAL DATA:  Near syncope. EXAM: PORTABLE CHEST 1 VIEW COMPARISON:   03/24/2017 and prior radiograph FINDINGS: Cardiomegaly noted. There is no evidence of focal airspace disease, pulmonary edema, suspicious pulmonary nodule/mass, pleural effusion, or pneumothorax. No acute bony abnormalities are identified. IMPRESSION: Cardiomegaly without evidence of acute cardiopulmonary disease. Electronically Signed   By: Margarette Canada M.D.   On: 11/01/2017 18:21   Dg Knee Complete 4 Views Right  Result Date: 11/01/2017 CLINICAL DATA:  Right knee pain after fall. EXAM: RIGHT KNEE - COMPLETE 4+ VIEW COMPARISON:  Right knee x-rays dated November 19, 2016. FINDINGS: Acute, mildly depressed fracture of the lateral tibial plateau. No large joint effusion. Mild tricompartmental osteoarthritis. Unchanged large 3.4 cm calcified loose body likely within a Baker's cyst. Osteopenia. Soft tissues are unremarkable. IMPRESSION: 1. Acute, mildly depressed fracture of the lateral tibial plateau. Electronically Signed   By: Titus Dubin M.D.   On: 11/01/2017 15:28    EKG: Sinus brady at 53 bpm with normal axis, prolonged QT and nonspecific ST-T wave changes.   Assessment/Plan  This is a 72 y.o. male with a history of COPD, CAD s/p stents, OSA, HTN, HLD, 2nd degree Mobitz I AV block now being admitted with:  #. Right tibial plateau fracture - Admit to inpatient, [facility] - Given extensive history will need cardio eval prior to OR - Ortho for surgical repair - Pain control. - Hold aspirin and Aleve for now  # Sinus bradycardia, asymptomatic - Monitor on telemetry  #. History of HTN - Continue Norvasc, HCTZ, Cozaar  #. History of gout - Continue allopurinol  #. History of GERD - Continue Protonix  #. History of CAD - Hold aspirin  #. History of OSA - Will need pulmonary eval prior to OR given OSA, body habitus and prior anesthesia complications.  Admission status: Inpatient IV Fluids: HL Diet/Nutrition: Heart healthy Consults called: Ortho called by EDP.  Will need cardio and  pulm prior to OR DVT Px: Heparin, SCDs and early ambulation. Code Status: Full Code  Disposition Plan: To be determined  All the records are reviewed and case discussed with ED provider. Management plans discussed with the patient and/or family who express understanding and agree with plan of care.  Harwood Nall D.O. on 11/01/2017 at 7:50 PM CC: Primary care physician; Maury Dus, MD   11/01/2017, 7:50 PM

## 2017-11-01 NOTE — ED Triage Notes (Signed)
Patient c/o right knee pain and tingling below knee after fall today. Denies head injury and LOC. Reports pain worsens with movement.

## 2017-11-01 NOTE — Progress Notes (Signed)
Reviewed imaging. 72 year old male with obesity, CHF, CAD, OSA, and COPD with right displaced Schatzker II tibial plateau fracture. Displacement on x-rays and CT scan would indicate an unstable fracture that would likely benefit from surgical intervention but unsure if patient would be operative candidate. Would recommend hospitalist admission for pain control and PT mobilization at [facility] for evaluation by the ortho trauma service. Please place in knee immobilizer and make patient NWB. Formal consult to follow in AM.  Shona Needles, MD [facility] [phone] (phone)

## 2017-11-01 NOTE — ED Notes (Signed)
Ortho Tech called. 

## 2017-11-02 ENCOUNTER — Other Ambulatory Visit: Payer: Self-pay

## 2017-11-02 ENCOUNTER — Encounter (HOSPITAL_COMMUNITY): Payer: Self-pay | Admitting: Internal Medicine

## 2017-11-02 DIAGNOSIS — I2510 Atherosclerotic heart disease of native coronary artery without angina pectoris: Secondary | ICD-10-CM | POA: Diagnosis not present

## 2017-11-02 DIAGNOSIS — S82141A Displaced bicondylar fracture of right tibia, initial encounter for closed fracture: Secondary | ICD-10-CM | POA: Diagnosis not present

## 2017-11-02 DIAGNOSIS — G4733 Obstructive sleep apnea (adult) (pediatric): Secondary | ICD-10-CM | POA: Diagnosis not present

## 2017-11-02 DIAGNOSIS — S82131D Displaced fracture of medial condyle of right tibia, subsequent encounter for closed fracture with routine healing: Secondary | ICD-10-CM

## 2017-11-02 DIAGNOSIS — Z955 Presence of coronary angioplasty implant and graft: Secondary | ICD-10-CM | POA: Diagnosis not present

## 2017-11-02 DIAGNOSIS — I10 Essential (primary) hypertension: Secondary | ICD-10-CM

## 2017-11-02 DIAGNOSIS — J9611 Chronic respiratory failure with hypoxia: Secondary | ICD-10-CM | POA: Diagnosis not present

## 2017-11-02 DIAGNOSIS — D62 Acute posthemorrhagic anemia: Secondary | ICD-10-CM | POA: Diagnosis not present

## 2017-11-02 DIAGNOSIS — Z01811 Encounter for preprocedural respiratory examination: Secondary | ICD-10-CM

## 2017-11-02 DIAGNOSIS — Z6841 Body Mass Index (BMI) 40.0 and over, adult: Secondary | ICD-10-CM | POA: Diagnosis not present

## 2017-11-02 DIAGNOSIS — J9811 Atelectasis: Secondary | ICD-10-CM | POA: Diagnosis not present

## 2017-11-02 DIAGNOSIS — E785 Hyperlipidemia, unspecified: Secondary | ICD-10-CM | POA: Diagnosis not present

## 2017-11-02 DIAGNOSIS — N183 Chronic kidney disease, stage 3 (moderate): Secondary | ICD-10-CM

## 2017-11-02 DIAGNOSIS — J45909 Unspecified asthma, uncomplicated: Secondary | ICD-10-CM

## 2017-11-02 LAB — CBC
HCT: 44.4 % (ref 39.0–52.0)
Hemoglobin: 14.8 g/dL (ref 13.0–17.0)
MCH: 30 pg (ref 26.0–34.0)
MCHC: 33.3 g/dL (ref 30.0–36.0)
MCV: 89.9 fL (ref 78.0–100.0)
PLATELETS: 201 10*3/uL (ref 150–400)
RBC: 4.94 MIL/uL (ref 4.22–5.81)
RDW: 13.8 % (ref 11.5–15.5)
WBC: 10.4 10*3/uL (ref 4.0–10.5)

## 2017-11-02 LAB — PROTIME-INR
INR: 1.08
Prothrombin Time: 13.9 seconds (ref 11.4–15.2)

## 2017-11-02 LAB — CREATININE, SERUM
Creatinine, Ser: 1.19 mg/dL (ref 0.61–1.24)
GFR calc Af Amer: >60 mL/min (ref >60)
GFR calc non Af Amer: 59 mL/min — ABNORMAL LOW (ref >60)

## 2017-11-02 MED ORDER — MORPHINE SULFATE (PF) 4 MG/ML IV SOLN: 1.0000 mg | INTRAVENOUS | Status: DC | PRN

## 2017-11-02 MED ORDER — MORPHINE SULFATE (PF) 2 MG/ML IV SOLN
0.5000 mg | INTRAVENOUS | Status: DC | PRN
  Administered 2017-11-02 – 2017-11-06 (×9): 0.5 mg via INTRAVENOUS
  Filled 2017-11-02 (×10): qty 1

## 2017-11-02 MED ORDER — IPRATROPIUM-ALBUTEROL 0.5-2.5 (3) MG/3ML IN SOLN
3.0000 mL | RESPIRATORY_TRACT | Status: DC | PRN
  Administered 2017-11-03: 3 mL via RESPIRATORY_TRACT
  Filled 2017-11-02: qty 3

## 2017-11-02 MED ORDER — MORPHINE SULFATE (PF) 4 MG/ML IV SOLN: 0.5000 mg | INTRAVENOUS | Status: DC | PRN

## 2017-11-02 MED ORDER — ASPIRIN EC 81 MG PO TBEC
81.0000 mg | DELAYED_RELEASE_TABLET | Freq: Every day | ORAL | Status: DC
  Administered 2017-11-02 – 2017-11-11 (×8): 81 mg via ORAL
  Filled 2017-11-02 (×9): qty 1

## 2017-11-02 MED ORDER — FUROSEMIDE 10 MG/ML IJ SOLN
40.0000 mg | Freq: Once | INTRAMUSCULAR | Status: AC
  Administered 2017-11-02: 40 mg via INTRAVENOUS
  Filled 2017-11-02: qty 4

## 2017-11-02 MED ORDER — SENNA 8.6 MG PO TABS
2.0000 | ORAL_TABLET | Freq: Every day | ORAL | Status: DC
  Administered 2017-11-02 – 2017-11-10 (×6): 17.2 mg via ORAL
  Filled 2017-11-02 (×9): qty 2

## 2017-11-02 MED ORDER — ACETAMINOPHEN 325 MG PO TABS: 650.0000 mg | ORAL_TABLET | Freq: Four times a day (QID) | ORAL | Status: DC | PRN

## 2017-11-02 MED ORDER — LORAZEPAM 0.5 MG PO TABS
0.5000 mg | ORAL_TABLET | Freq: Two times a day (BID) | ORAL | Status: DC | PRN
  Administered 2017-11-02 – 2017-11-09 (×6): 0.5 mg via ORAL
  Filled 2017-11-02 (×7): qty 1

## 2017-11-02 MED ORDER — BISACODYL 10 MG RE SUPP: 10.0000 mg | Freq: Every day | RECTAL | Status: DC | PRN

## 2017-11-02 MED ORDER — POTASSIUM CHLORIDE CRYS ER 20 MEQ PO TBCR
20.0000 meq | EXTENDED_RELEASE_TABLET | Freq: Once | ORAL | Status: AC
  Administered 2017-11-02: 20 meq via ORAL
  Filled 2017-11-02: qty 1

## 2017-11-02 MED ORDER — POLYETHYLENE GLYCOL 3350 17 G PO PACK
17.0000 g | PACK | Freq: Every day | ORAL | Status: DC
  Administered 2017-11-02: 17 g via ORAL
  Filled 2017-11-02: qty 1

## 2017-11-02 NOTE — ED Notes (Signed)
This RN attempted to call report x1. The floor will call back momentarily.

## 2017-11-02 NOTE — Progress Notes (Signed)
PROGRESS NOTE   [name]  ZJQ:[id number]    DOB: January 01, 1946    DOA: 11/01/2017  PCP: Maury Dus, MD   I have briefly reviewed patients previous medical records in [facility].  Brief Narrative:  72 year old married male, independent, PMH of morbid obesity, chronic respiratory failure with hypoxia on PRN home oxygen, OSA on nightly BiPAP, CAD status post stents (last one appears to be 02/08/2015), GERD, HLD, HTN,, second-degree Mobitz 1 AV block, chronic constipation, difficulty with anesthesia during a prior surgery, sustained a mechanical fall at home on day of admission when his legs felt weak and suddenly gave away and presented to the ED with right tibial plateau fracture.  Orthopedics consulted and recommend [facility] admission for evaluation by Ortho trauma service.  Pulmonology and cardiology were consulted for preop clearance.   Assessment & Plan:   Active Problems:   Right medial tibial plateau fracture   Right displaced tibial plateau fracture: Sustained status post mechanical fall.  Orthopedics have seen and recommend [facility] admission for evaluation by Ortho trauma service.  Knee immobilizer in place and nonweightbearing for now.  Patient with multiple severe significant cardiopulmonary risk factors.  Reported prior difficulties during a surgical procedure.  Pulmonology and cardiology consulted for preop clearance.  As per pulmonology, prior anesthetic difficulties were remote and has had surgery since without problems, likely moderate risk for anesthesia, reduce duration of anesthesia to shortest time possible, aspiration precautions and incentive spirometry preop.  Cardiology/Dr. [name] input pending.  Essential hypertension: Mildly uncontrolled.  Continue amlodipine, losartan, HCTZ.  Add PRN IV hydralazine.  Adjust medications as needed.  Severe OSA: As per pulmonology on BiPAP 12/8 at night.  Continue same and as needed daytime during sleep.  ?  Asthma with wheezing: As per  pulmonology, scheduled DuoNeb and provided a dose of Lasix 40 mg IV x1 because they felt that wheezing was related to volume overload.  Chronic respiratory failure with hypoxia: Patient states that he uses as needed oxygen at home.  Incentive spirometry and management as above.  CAD status post stents: No chest pain.  Aspirin currently on hold, will be resumed.  Chronic dyspnea on exertion which has not changed.  Await cardiology input for preop clearance.  Second-degree Mobitz type I AV block: Await cardiology input.  GERD: PPI.  Chronic constipation: Initiated aggressive bowel regimen.  Early mobilization.  Morbid obesity/Body mass index is 41.16 kg/m.   History of gout: No acute flare.  Anxiety: Patient states that he gets anxious if he is unable to sit up by side of bed.  Currently unsafe to do so without risking a fall due to his immobilized right lower extremity.  PRN Ativan ordered.  Stage III chronic kidney disease: Presented with creatinine of 1.39 which improved to 1.19. Baseline creatinine not clear but may be in the 1.2 range.    DVT prophylaxis: SCDs, subcutaneous heparin Code Status: Full Family Communication: None at bedside. Disposition: To be determined pending clinical improvement   Consultants:  Orthopedics Pulmonology Cardiology/Dr. [name]  Procedures:  Right knee immobilizer.  Antimicrobials:  None.   Subjective: Seen this morning in the ED.  Patient lives with his wife.  Independent and does not use a cane or a walker.  Uses oxygen as needed.  Uses nightly BiPAP.  Remote history of smoking.  Chronic dyspnea on exertion after walking 15 to 20 feet but no chest pain and no recent changes.  Yesterday his legs just felt weak and gave away leading  to fall.  Anxious.   ROS: Chronic constipation.  Otherwise negative.  Discussed with ED RN, pulmonology and orthopedics.  Objective:  Vitals:   11/02/17 0651 11/02/17 0801 11/02/17 1110 11/02/17 1112  BP:  (!) 161/75 (!) 175/84 (!) 147/61 (!) 147/61  Pulse: 64 64 64   Resp: (!) 32 (!) 22 18   Temp:      TempSrc:      SpO2: 94% 94% 97%   Weight:      Height:        Examination:  General exam: Pleasant elderly male, moderately built and morbidly obese, lying comfortably propped up in bed. Respiratory system: Clear to auscultation. Respiratory effort normal. Cardiovascular system: S1 & S2 heard, RRR. No JVD, murmurs, rubs, gallops or clicks.  Trace bilateral ankle edema. Gastrointestinal system: Abdomen is nondistended/obese, soft and nontender. No organomegaly or masses felt. Normal bowel sounds heard. Central nervous system: Alert and oriented. No focal neurological deficits. Extremities: Symmetric 5 x 5 power.  Right lower extremity movements limited due to fracture and immobilized in a knee immobilizer. Skin: No rashes, lesions or ulcers Psychiatry: Judgement and insight appear normal. Mood & affect appropriate.     Data Reviewed: I have personally reviewed following labs and imaging studies  CBC: Recent Labs  Lab 11/01/17 1702 11/02/17 0313  WBC 13.3* 10.4  NEUTROABS 9.8*  --   HGB 16.5 14.8  HCT 47.9 44.4  MCV 89.7 89.9  PLT 218 409   Basic Metabolic Panel: Recent Labs  Lab 11/01/17 1702 11/02/17 0313  NA 142  --   K 4.1  --   CL 104  --   CO2 27  --   GLUCOSE 119*  --   BUN 21*  --   CREATININE 1.39* 1.19  CALCIUM 10.7*  --    Liver Function Tests: Recent Labs  Lab 11/01/17 1702  AST 26  ALT 26  ALKPHOS 60  BILITOT 1.0  PROT 7.9  ALBUMIN 4.0   Coagulation Profile: Recent Labs  Lab 11/02/17 0313  INR 1.08     Radiology Studies: Ct Knee Right Wo Contrast  Result Date: 11/01/2017 CLINICAL DATA:  Knee fracture EXAM: CT OF THE right KNEE WITHOUT CONTRAST TECHNIQUE: Multidetector CT imaging of the right knee was performed according to the standard protocol. Multiplanar CT image reconstructions were also generated. COMPARISON:  Radiograph 11/01/2017  FINDINGS: Bones/Joint/Cartilage Acute comminuted fracture involving the lateral plateau of the tibia with approximately 9 mm of central depression. Fracture lucency extends to the intercondylar region of the proximal tibia and the posterior articular surface of the proximal tibia. No lucency evident within the medial plateau of the tibia. Fibular head appears intact. No distal femoral fracture. No patellar fracture. Moderate hemarthrosis. Moderate arthritis of the medial knee joint with subchondral sclerosis and moderate narrowing. Mild spurring. Mild patellofemoral degenerative change with superior and inferior patellar spurring. Ligaments Suboptimally assessed by CT. Muscles and Tendons Mild muscular atrophy. Quadriceps tendon and patellar tendon appear grossly intact. Soft tissues Rim calcified lucent lesion within the popliteal fossa, may relate to dystrophic calcification or old trauma. Generalized soft tissue swelling. IMPRESSION: 1. Acute comminuted lateral tibial plateau fracture with about 9 mm central depression and extension of fracture lucency to the inter spinous region of the proximal tibia. Associated moderate hemarthrosis. 2. No other acute osseous abnormality seen. Moderate arthritis of the knee at the medial compartment. Electronically Signed   By: Donavan Foil M.D.   On: 11/01/2017 20:04   Dg  Chest Port 1 View  Result Date: 11/01/2017 CLINICAL DATA:  Near syncope. EXAM: PORTABLE CHEST 1 VIEW COMPARISON:  03/24/2017 and prior radiograph FINDINGS: Cardiomegaly noted. There is no evidence of focal airspace disease, pulmonary edema, suspicious pulmonary nodule/mass, pleural effusion, or pneumothorax. No acute bony abnormalities are identified. IMPRESSION: Cardiomegaly without evidence of acute cardiopulmonary disease. Electronically Signed   By: Margarette Canada M.D.   On: 11/01/2017 18:21   Dg Knee Complete 4 Views Right  Result Date: 11/01/2017 CLINICAL DATA:  Right knee pain after fall. EXAM:  RIGHT KNEE - COMPLETE 4+ VIEW COMPARISON:  Right knee x-rays dated November 19, 2016. FINDINGS: Acute, mildly depressed fracture of the lateral tibial plateau. No large joint effusion. Mild tricompartmental osteoarthritis. Unchanged large 3.4 cm calcified loose body likely within a Baker's cyst. Osteopenia. Soft tissues are unremarkable. IMPRESSION: 1. Acute, mildly depressed fracture of the lateral tibial plateau. Electronically Signed   By: Titus Dubin M.D.   On: 11/01/2017 15:28        Scheduled Meds: . allopurinol  300 mg Oral Daily  . amLODipine  10 mg Oral Daily  . heparin  5,000 Units Subcutaneous Q8H  . hydrochlorothiazide  25 mg Oral Daily  . losartan  100 mg Oral Daily  . pantoprazole  40 mg Oral Daily  . polyethylene glycol  17 g Oral Daily  . senna  2 tablet Oral Daily   Continuous Infusions:   LOS: 1 day     Vernell Leep, MD, FACP, [facility]. [facility] Pager [phone]  If 7PM-7AM, please contact night-coverage [email or web address] Password [email or web address] 11/02/2017, 11:35 AM

## 2017-11-02 NOTE — ED Notes (Signed)
Hospitalist at the bedside 

## 2017-11-02 NOTE — Consult Note (Signed)
Cardiology Consult Note  Admit date: 11/01/2017 Name: [name] 72 y.o.  male DOB:  04/14/1946 MRN:  [id number]  Today's date:  11/02/2017  Referring Physician:    [facility]  Primary Physician:    [facility]  Reason for Consultation:    Preoperative evaluation  IMPRESSIONS: 1.  First and second degree heart block currently asymptomatic 2.  CAD with previous multivessel stenting asymptomatic 3.  Severe morbid obesity 4.  Hypertensive heart disease somewhat above goal 5.  Asthma 6.  Chronic respiratory failure with hypoxia 7.  Severe sleep apnea 8.  Severe morbid obesity  RECOMMENDATION: From a cardiovascular viewpoint the patient is a moderate risk of complications from surgery.  I think the risk is predominantly related to his pulmonary status and his sleep apnea as well as severe morbid obesity.  Second degree heart block has been stable for a number of years and asymptomatic.  When previously seen by electrophysiology and they have not recommended pacing for this as he has been asymptomatic despite the fact that he has had somewhat slow pulse rate.  I would monitor him carefully with minimization of anesthesia and avoidance of any AV nodal blocking agents of any type.  I will touch base with electrophysiology but feel it would be okay to proceed with surgery   HISTORY: This 72 year old male has a history of severe morbid obesity and has had previous multivessel stenting the last in 2016.  In 2016 he was noted have significant first and second-degree AV block with very slow pulse rates and was seen at that time by electrophysiology.  He was not felt to be in need of permanent pacemaking at that time and has remained asymptomatic.  I recently saw him in the office as he was noted to be severely bradycardic in preparation for a colonoscopy and was still noted to have second degree heart block.  He has not had any syncope and just has severe morbid obesity and has  gained weight and not been able to lose any significant weight.  He denies angina and has no PND, orthopnea or edema.  He had a mechanical fallcoming out of church yesterday.  He knew that he was following and did not have syncope dizziness or any cardiac symptoms at the time.  He currently has a tibial fracture in need of repair.  He has been seen by pulmonary.  Past Medical History:  Diagnosis Date  . Arthritis    "left shoulder" (02/09/2015)  . Blind left eye   . CAD (coronary artery disease)   . Complication of anesthesia    "they give me too much anesthesia & had to put me on life support for a little bit w/gallbladder OR"  . Coronary artery disease   . Former smoker   . GERD (gastroesophageal reflux disease)   . Gout   . History of bleeding ulcers   . History of gout   . History of peptic ulcer disease   . Hyperlipidemia   . Hypertension   . Hypertensive heart disease without CHF   . Kidney stones   . Lumbar disc disease   . Myocardial infarction [facility]) 1995; 2007  . Obesity   . OSA (obstructive sleep apnea)   . Pneumonia X 2  . Prostate cancer ([place])   . Pulmonary nodule   . Second degree Mobitz I AV block       Past Surgical History:  Procedure Laterality Date  . APPENDECTOMY    . APPENDECTOMY  ~  1985  . BACK SURGERY    . CARDIAC CATHETERIZATION N/A 02/08/2015   Procedure: Left Heart Cath and Coronary Angiography;  Surgeon: Leonie Man, MD;  Location: [place] [facility];  Service: Cardiovascular;  Laterality: N/A;  . CARDIAC CATHETERIZATION N/A 02/08/2015   Procedure: Coronary Stent Intervention;  Surgeon: Leonie Man, MD;  Location: [place] [facility];  Service: Cardiovascular;  Laterality: N/A;  . CHOLECYSTECTOMY    . CORONARY ANGIOPLASTY WITH STENT PLACEMENT  ~ 1995; 2007   "1 stent; 2 stents"  . LAPAROSCOPIC CHOLECYSTECTOMY    . LITHOTRIPSY  X 1  . LUMBAR LAMINECTOMY    . POSTERIOR LUMBAR FUSION  2003?  . PROSTATE BIOPSY  2015     Allergies:  has No  Known Allergies.   Medications: Prior to Admission medications   Medication Sig Start Date End Date Taking? Authorizing Provider  acetaminophen (TYLENOL) 500 MG tablet Take 500 mg by mouth every 6 (six) hours as needed for moderate pain.   Yes [provider]  allopurinol (ZYLOPRIM) 300 MG tablet Take 300 mg by mouth Daily.  11/07/11  Yes [provider]  amLODipine (NORVASC) 10 MG tablet Take 10 mg by mouth Daily.  11/07/11  Yes [provider]  aspirin EC 81 MG EC tablet Take 1 tablet (81 mg total) by mouth daily. 02/10/15  Yes Jacolyn Reedy, MD  hydrochlorothiazide (HYDRODIURIL) 25 MG tablet Take 25 mg by mouth daily.   Yes [provider]  losartan (COZAAR) 100 MG tablet Take 100 mg by mouth daily.   Yes [provider]  naproxen sodium (ALEVE) 220 MG tablet Take 220 mg by mouth daily as needed (pain).   Yes [provider]  pantoprazole (PROTONIX) 40 MG tablet  03/10/17  Yes [provider]  nitroGLYCERIN (NITROSTAT) 0.4 MG SL tablet Place 1 tablet (0.4 mg total) under the tongue every 5 (five) minutes x 3 doses as needed for chest pain. Patient not taking: Reported on 10/19/2017 02/09/15   Jacolyn Reedy, MD  Tiotropium Bromide Monohydrate (SPIRIVA RESPIMAT) 1.25 MCG/ACT AERS Inhale 2 puffs into the lungs daily. Patient not taking: Reported on 11/01/2017 10/19/17   Brand Males, MD   Family History: Family Status  Relation Name Status  . Mother  Deceased       died of DVT  . Father  Deceased       CVA  . Brother  Deceased at age 73       died of MI   . Sister  (Not Specified)  . Unknown  (Not Specified)   Social History:   reports that he quit smoking about 34 years ago. His smoking use included cigarettes. He has a 105.00 pack-year smoking history. He has never used smokeless tobacco. He reports that he drinks alcohol. He reports that he does not use drugs.   Social History   Social History Narrative   **  Merged History Encounter **        Review of Systems: Other than as noted above the remainder of the review of systems is unremarkable  Physical Exam: BP (!) 118/57   Pulse 67   Temp 98.4 F (36.9 C) (Oral)   Resp (!) 21   Ht 6\' 1"  (1.854 m)   Wt (!) 141.5 kg (312 lb)   SpO2 97%   BMI 41.16 kg/m   General appearance: severely obese white male currently in no acute distress Head: Normocephalic, without obvious abnormality, atraumatic, balding  male hair pattern Eyes: conjunctivae/corneas clear. PERRL, EOM's intact. Fundi benign. Neck: no adenopathy, no carotid bruit, supple, symmetrical, trachea midline and JVD difficult to assess Lungs: wheezing bilaterally Heart: somewhat irregular rhythm slow at times, normal S1-S2 no murmur Abdomen: soft, non-tender; bowel sounds normal; no masses,  no organomegaly Rectal: deferred Extremities: right knee is bandaged and is painful for motion.  No edema is noted. Pulses: 2+ and symmetric Skin: Skin color, texture, turgor normal. No rashes or lesions Neurologic: Grossly normal Psych: Alert and oriented x 3 Labs: CBC Recent Labs    11/01/17 1702 11/02/17 0313  WBC 13.3* 10.4  RBC 5.34 4.94  HGB 16.5 14.8  HCT 47.9 44.4  PLT 218 201  MCV 89.7 89.9  MCH 30.9 30.0  MCHC 34.4 33.3  RDW 13.9 13.8  LYMPHSABS 2.5  --   MONOABS 0.8  --   EOSABS 0.2  --   BASOSABS 0.0  --    CMP  Recent Labs    11/01/17 1702 11/02/17 0313  NA 142  --   K 4.1  --   CL 104  --   CO2 27  --   GLUCOSE 119*  --   BUN 21*  --   CREATININE 1.39* 1.19  CALCIUM 10.7*  --   PROT 7.9  --   ALBUMIN 4.0  --   AST 26  --   ALT 26  --   ALKPHOS 60  --   BILITOT 1.0  --   GFRNONAA 49* 59*  GFRAA 56* >60   BNP (last 3 results) BNP No results found for: BNP Cardiac Panel (last 3 results) Troponin (Point of Care Test) Recent Labs    11/01/17 1709  TROPIPOC 0.01   Cardiac Panel (last 3 results) No results for input(s): CKTOTAL, CKMB, TROPONINI,  RELINDX in the last 72 hours.   Radiology:  Cardiomegaly, clear lungs  EKG: Sinus rhythm with first-degree AV block as well as second-degree block with Wenckebach, nonspecific ST changes. Independently reviewed by me  Signed:  W. Doristine Church MD [facility]   Cardiology Consultant  11/02/2017, 1:40 PM

## 2017-11-02 NOTE — ED Notes (Signed)
[facility] notified of need for transport. °

## 2017-11-02 NOTE — Consult Note (Addendum)
Name: [name] MRN: [id number] DOB: 1946-01-26    ADMISSION DATE:  11/01/2017 CONSULTATION DATE:  11/02/17  REFERRING MD :  Dr. Algis Liming / [facility]   CHIEF COMPLAINT:  Pre-Operative Pulmonary Evaluation for Right Tibial Plateau Fracture    HISTORY OF PRESENT ILLNESS:  72 y/o M who [facility] on [facility] with reports of right knee pain and tingling after a mechanical fall.    The patient reports he went to lunch after church on 5/19 and when walking out of the restaurant he fell catching himself on the tailgate of his truck.  He hit his right knee on the pavement but not at full force.  After the fall, he had pain & tingling in his leg prompting evaluation.  He was found to have an acute, mildly depressed fracture of the lateral tibial plateau.  He currently reports right knee pain, constipation and difficulty sleeping overnight.  PCCM consulted for pre-operative pulmonary evaluation.   The patient has a pulmonary hx of severe OSA (PSG 03/2014 AHI 97) on BiPAP QHS (compliant, sleeps ~ 4-6 hours per night), former smoker (105 pk years, quit in 1985), prior LLL PNA and LLL 1mm pulmonary nodule (since 06/2009).  Prior PFT's (03/2017) showed severe restriction, significant bronchodilator response, reduced DLCO.  HRCT negative for ILD.  ECHO 03/2017 with LVEF 60-65%, moderate LA dilation, PAP 13mmHg.  He worked as a Dealer.   PAST MEDICAL HISTORY :   has a past medical history of Arthritis, Blind left eye, CAD (coronary artery disease), Complication of anesthesia, COPD (chronic obstructive pulmonary disease) ([place]), Coronary artery disease, GERD (gastroesophageal reflux disease), Gout, History of bleeding ulcers, History of gout, History of peptic ulcer disease, Hyperlipidemia, Hypertension, Hypertensive heart disease without CHF, Kidney stones, Lumbar disc disease, Myocardial infarction ([place]) (1995; 2007), Obesity, OSA (obstructive sleep apnea), OSA on CPAP, Pneumonia, Pneumonia (X 2), Prostate  cancer ([place]), Pulmonary nodule, and Second degree Mobitz I AV block.   has a past surgical history that includes Cholecystectomy; Lumbar laminectomy; Appendectomy; Appendectomy (~ 1985); Laparoscopic cholecystectomy; Back surgery; Posterior lumbar fusion (2003?); Coronary angioplasty with stent (~ 1995; 2007); Prostate biopsy (2015); Lithotripsy (X 1); Cardiac catheterization (N/A, 02/08/2015); and Cardiac catheterization (N/A, 02/08/2015).  Prior to Admission medications   Medication Sig Start Date End Date Taking? Authorizing Provider  acetaminophen (TYLENOL) 500 MG tablet Take 500 mg by mouth every 6 (six) hours as needed for moderate pain.   Yes [provider]  allopurinol (ZYLOPRIM) 300 MG tablet Take 300 mg by mouth Daily.  11/07/11  Yes [provider]  amLODipine (NORVASC) 10 MG tablet Take 10 mg by mouth Daily.  11/07/11  Yes [provider]  aspirin EC 81 MG EC tablet Take 1 tablet (81 mg total) by mouth daily. 02/10/15  Yes Jacolyn Reedy, MD  hydrochlorothiazide (HYDRODIURIL) 25 MG tablet Take 25 mg by mouth daily.   Yes [provider]  losartan (COZAAR) 100 MG tablet Take 100 mg by mouth daily.   Yes [provider]  naproxen sodium (ALEVE) 220 MG tablet Take 220 mg by mouth daily as needed (pain).   Yes [provider]  pantoprazole (PROTONIX) 40 MG tablet  03/10/17  Yes [provider]  nitroGLYCERIN (NITROSTAT) 0.4 MG SL tablet Place 1 tablet (0.4 mg total) under the tongue every 5 (five) minutes x 3 doses as needed for chest pain. Patient not taking: Reported on 10/19/2017 02/09/15   Jacolyn Reedy, MD  Tiotropium Bromide Monohydrate (  SPIRIVA RESPIMAT) 1.25 MCG/ACT AERS Inhale 2 puffs into the lungs daily. Patient not taking: Reported on 11/01/2017 10/19/17   Brand Males, MD    No Known Allergies  FAMILY HISTORY:  family history includes Aneurysm in his father; COPD in his sister; Coronary artery disease in his  mother; Hypertension in his father and mother; Sleep apnea in his unknown relative.  SOCIAL HISTORY:  reports that he quit smoking about 34 years ago. His smoking use included cigarettes. He has a 105.00 pack-year smoking history. He has never used smokeless tobacco. He reports that he drinks alcohol. He reports that he does not use drugs.  REVIEW OF SYSTEMS:  POSITIVES IN BOLD Constitutional: Negative for fever, chills, weight loss, malaise/fatigue and diaphoresis.  HENT: Negative for hearing loss, ear pain, nosebleeds, congestion, sore throat, neck pain, tinnitus and ear discharge.   Eyes: Negative for blurred vision, double vision, photophobia, pain, discharge and redness.  Respiratory: Negative for cough, hemoptysis, sputum production, shortness of breath, wheezing and stridor.   Cardiovascular: Negative for chest pain, palpitations, orthopnea, claudication, leg swelling and PND.  Gastrointestinal: Negative for heartburn, nausea, vomiting, abdominal pain, diarrhea, constipation, blood in stool and melena.  Genitourinary: Negative for dysuria, urgency, frequency, hematuria and flank pain.  Musculoskeletal: Negative for myalgias, back pain, joint pain and falls.  Skin: Negative for itching and rash.  Neurological: Negative for dizziness, tingling, tremors, sensory change, speech change, focal weakness, seizures, loss of consciousness, weakness and headaches.  Endo/Heme/Allergies: Negative for environmental allergies and polydipsia. Does not bruise/bleed easily.   SUBJECTIVE:   VITAL SIGNS: Temp:  [98.4 F (36.9 C)] 98.4 F (36.9 C) (05/19 1438) Pulse Rate:  [33-129] 64 (05/20 0801) Resp:  [14-32] 22 (05/20 0801) BP: (129-179)/(48-102) 175/84 (05/20 0801) SpO2:  [92 %-98 %] 94 % (05/20 0801) Weight:  [312 lb (141.5 kg)] 312 lb (141.5 kg) (05/19 1438)  PHYSICAL EXAMINATION: General:  Obese adult male in NAD, lying in bed Neuro:  AAOx4, speech clear, MAE  HEENT:  MM pink/moist, no  jvd, 1L O2  Cardiovascular:  s1s2 irr irr, bradycardia on monitor 30-50's Lungs:  Even/non-labored, lungs bilaterally with exp wheezing  Abdomen:  Obese/soft, bsx4 active  Musculoskeletal:  RLE in immobilizer Skin:  Warm/dry, 1-2+ BLE pitting edema   Recent Labs  Lab 11/01/17 1702 11/02/17 0313  NA 142  --   K 4.1  --   CL 104  --   CO2 27  --   BUN 21*  --   CREATININE 1.39* 1.19  GLUCOSE 119*  --     Recent Labs  Lab 11/01/17 1702 11/02/17 0313  HGB 16.5 14.8  HCT 47.9 44.4  WBC 13.3* 10.4  PLT 218 201    Ct Knee Right Wo Contrast  Result Date: 11/01/2017 CLINICAL DATA:  Knee fracture EXAM: CT OF THE right KNEE WITHOUT CONTRAST TECHNIQUE: Multidetector CT imaging of the right knee was performed according to the standard protocol. Multiplanar CT image reconstructions were also generated. COMPARISON:  Radiograph 11/01/2017 FINDINGS: Bones/Joint/Cartilage Acute comminuted fracture involving the lateral plateau of the tibia with approximately 9 mm of central depression. Fracture lucency extends to the intercondylar region of the proximal tibia and the posterior articular surface of the proximal tibia. No lucency evident within the medial plateau of the tibia. Fibular head appears intact. No distal femoral fracture. No patellar fracture. Moderate hemarthrosis. Moderate arthritis of the medial knee joint with subchondral sclerosis and moderate narrowing. Mild spurring. Mild patellofemoral degenerative change with superior and  inferior patellar spurring. Ligaments Suboptimally assessed by CT. Muscles and Tendons Mild muscular atrophy. Quadriceps tendon and patellar tendon appear grossly intact. Soft tissues Rim calcified lucent lesion within the popliteal fossa, may relate to dystrophic calcification or old trauma. Generalized soft tissue swelling. IMPRESSION: 1. Acute comminuted lateral tibial plateau fracture with about 9 mm central depression and extension of fracture lucency to the  inter spinous region of the proximal tibia. Associated moderate hemarthrosis. 2. No other acute osseous abnormality seen. Moderate arthritis of the knee at the medial compartment. Electronically Signed   By: Donavan Foil M.D.   On: 11/01/2017 20:04   Dg Chest Port 1 View  Result Date: 11/01/2017 CLINICAL DATA:  Near syncope. EXAM: PORTABLE CHEST 1 VIEW COMPARISON:  03/24/2017 and prior radiograph FINDINGS: Cardiomegaly noted. There is no evidence of focal airspace disease, pulmonary edema, suspicious pulmonary nodule/mass, pleural effusion, or pneumothorax. No acute bony abnormalities are identified. IMPRESSION: Cardiomegaly without evidence of acute cardiopulmonary disease. Electronically Signed   By: Margarette Canada M.D.   On: 11/01/2017 18:21   Dg Knee Complete 4 Views Right  Result Date: 11/01/2017 CLINICAL DATA:  Right knee pain after fall. EXAM: RIGHT KNEE - COMPLETE 4+ VIEW COMPARISON:  Right knee x-rays dated November 19, 2016. FINDINGS: Acute, mildly depressed fracture of the lateral tibial plateau. No large joint effusion. Mild tricompartmental osteoarthritis. Unchanged large 3.4 cm calcified loose body likely within a Baker's cyst. Osteopenia. Soft tissues are unremarkable. IMPRESSION: 1. Acute, mildly depressed fracture of the lateral tibial plateau. Electronically Signed   By: Titus Dubin M.D.   On: 11/01/2017 15:28      SIGNIFICANT EVENTS  5/19  Admit post fall with R tibial plateau fracture  STUDIES   CULTURES   ANTIBIOTICS     ASSESSMENT / PLAN:  Discussion:  72 y/o M with severe OSA on BiPAP, restrictive lung disease by PFT with bronchodilator response admitted after mechanical fall with right tibial plateau fracture.    Right Tibial Plateau Fracture  Prior Anesthesia Difficulties - remote, has had surgery since w/o problems waking P: - per Ortho  - patient likely moderate risk for anesthesia - reduce duration of anesthesia to shortest time possible  - aspiration  precautions  - incentive spirometry pre-op   Severe OSA - on BiPAP 12/8  P: - continue QHS BiPAP & PRN daytime sleep    Restrictive Lung Disease - likely in the setting of morbid obesity  P: - weight loss counseling    Wheezing / Bronchodilator Responsive on PFT P: - add scheduled duoneb Q6 pre-op - pt will need teaching on home med device use as he is unsure how to use his Spiriva - lasix 40 mg IV x1 + 20 mEq KCL (wheeze may be volume related)   Bradycardia / Irregular Rhythm  Hx HTN, HLD, CAD, Second Degree Mobitz I  P: - Recommend Cardiology consultation pre-operatively    Constipation  P: - miralax QD - senokot QD  - early mobilization     Noe Gens, NP-C [facility] Pgr: [phone] or if no answer [phone] 11/02/2017, 8:31 AM

## 2017-11-02 NOTE — Progress Notes (Addendum)
Attempted to receive report x2, left on hold for >6 minutes both times.

## 2017-11-02 NOTE — Progress Notes (Signed)
RT placed patient on BIPAP. Patient tolerating well at this time. RT will monitor as needed. 

## 2017-11-02 NOTE — ED Notes (Signed)
Bed: WA21 Expected date:  Expected time:  Means of arrival:  Comments: 

## 2017-11-03 DIAGNOSIS — J449 Chronic obstructive pulmonary disease, unspecified: Secondary | ICD-10-CM

## 2017-11-03 LAB — SURGICAL PCR SCREEN
MRSA, PCR: NEGATIVE
STAPHYLOCOCCUS AUREUS: NEGATIVE

## 2017-11-03 MED ORDER — IPRATROPIUM-ALBUTEROL 0.5-2.5 (3) MG/3ML IN SOLN
3.0000 mL | Freq: Three times a day (TID) | RESPIRATORY_TRACT | Status: DC
  Administered 2017-11-03 – 2017-11-05 (×7): 3 mL via RESPIRATORY_TRACT
  Filled 2017-11-03 (×6): qty 3

## 2017-11-03 MED ORDER — PREDNISONE 20 MG PO TABS
30.0000 mg | ORAL_TABLET | Freq: Once | ORAL | Status: DC
  Filled 2017-11-03: qty 1

## 2017-11-03 MED ORDER — ARFORMOTEROL TARTRATE 15 MCG/2ML IN NEBU
15.0000 ug | INHALATION_SOLUTION | Freq: Two times a day (BID) | RESPIRATORY_TRACT | Status: DC
Start: 2017-11-03 — End: 2017-11-11
  Administered 2017-11-03 – 2017-11-10 (×15): 15 ug via RESPIRATORY_TRACT
  Filled 2017-11-03 (×16): qty 2

## 2017-11-03 MED ORDER — MONTELUKAST SODIUM 10 MG PO TABS
10.0000 mg | ORAL_TABLET | Freq: Every day | ORAL | Status: DC
  Administered 2017-11-03 – 2017-11-10 (×8): 10 mg via ORAL
  Filled 2017-11-03 (×9): qty 1

## 2017-11-03 MED ORDER — BUDESONIDE 0.25 MG/2ML IN SUSP
0.2500 mg | Freq: Two times a day (BID) | RESPIRATORY_TRACT | Status: DC
  Administered 2017-11-03 – 2017-11-10 (×15): 0.25 mg via RESPIRATORY_TRACT
  Filled 2017-11-03 (×16): qty 2

## 2017-11-03 MED ORDER — GUAIFENESIN-DM 100-10 MG/5ML PO SYRP
5.0000 mL | ORAL_SOLUTION | ORAL | Status: DC | PRN
  Administered 2017-11-03 – 2017-11-04 (×4): 5 mL via ORAL
  Filled 2017-11-03 (×4): qty 5

## 2017-11-03 MED ORDER — ALBUTEROL SULFATE (2.5 MG/3ML) 0.083% IN NEBU: 2.5000 mg | INHALATION_SOLUTION | RESPIRATORY_TRACT | Status: DC | PRN

## 2017-11-03 MED ORDER — POLYETHYLENE GLYCOL 3350 17 G PO PACK
17.0000 g | PACK | Freq: Two times a day (BID) | ORAL | Status: DC
  Administered 2017-11-03 – 2017-11-10 (×12): 17 g via ORAL
  Filled 2017-11-03 (×16): qty 1

## 2017-11-03 NOTE — Consult Note (Signed)
Orthopaedic Trauma Service (OTS) Consult   Patient ID: [name] MRN: [id number] DOB/AGE: April 27, 1946 73 y.o.  Reason for Consult:Right tibial plateau fracture Referring Physician: Dr. Lajean Saver, MD [facility]  HPI: [name] is an 72 y.o. male who is being seen in consultation at the request of Dr. Ashok Cordia  for evaluation of right tibial plateau fracture.  The patient has a significant past medical history when he was at church when he suddenly felt weak and leg gave out from underneath him and he fell to the ground.  He had immediate pain and deformity to his leg.  He was not able to ambulate on it.  He drove himself home and subsequently went to the emergency room.  X-rays showed a tibial plateau fracture with significant lateral joint displacement.  Orthopedics was consulted for evaluation.  Medical history includes obesity, COPD, coronary artery disease, obstructive sleep apnea, hypertension and hyperlipidemia.  He is ambulatory without assist device.  He has had previous injection in his right knee which have improved his symptoms.  Otherwise he has not had any significant problems with his knee.  Currently his pain is controlled when he does not move it.  It hurts anytime the movement is attempted.  Denies any numbness or tingling.  Past Medical History:  Diagnosis Date  . Arthritis    "left shoulder" (02/09/2015)  . Blind left eye   . CAD (coronary artery disease)   . Complication of anesthesia    "they give me too much anesthesia & had to put me on life support for a little bit w/gallbladder OR"  . Coronary artery disease   . Former smoker   . GERD (gastroesophageal reflux disease)   . Gout   . History of bleeding ulcers   . History of gout   . History of peptic ulcer disease   . Hyperlipidemia   . Hypertension   . Hypertensive heart disease without CHF   . Kidney stones   . Lumbar disc disease   . Myocardial infarction [facility]) 1995; 2007  . Obesity   . OSA  (obstructive sleep apnea)   . Pneumonia X 2  . Prostate cancer ([place])   . Pulmonary nodule   . Second degree Mobitz I AV block     Past Surgical History:  Procedure Laterality Date  . APPENDECTOMY    . APPENDECTOMY  ~ 1985  . BACK SURGERY    . CARDIAC CATHETERIZATION N/A 02/08/2015   Procedure: Left Heart Cath and Coronary Angiography;  Surgeon: Leonie Man, MD;  Location: [place] [facility];  Service: Cardiovascular;  Laterality: N/A;  . CARDIAC CATHETERIZATION N/A 02/08/2015   Procedure: Coronary Stent Intervention;  Surgeon: Leonie Man, MD;  Location: [place] [facility];  Service: Cardiovascular;  Laterality: N/A;  . CHOLECYSTECTOMY    . CORONARY ANGIOPLASTY WITH STENT PLACEMENT  ~ 1995; 2007   "1 stent; 2 stents"  . LAPAROSCOPIC CHOLECYSTECTOMY    . LITHOTRIPSY  X 1  . LUMBAR LAMINECTOMY    . POSTERIOR LUMBAR FUSION  2003?  . PROSTATE BIOPSY  2015    Family History  Problem Relation Age of Onset  . Hypertension Mother   . Coronary artery disease Mother   . Aneurysm Father   . Hypertension Father   . COPD Sister   . Sleep apnea Unknown        3 siblings    Social History:  reports that he quit smoking about 34 years  ago. His smoking use included cigarettes. He has a 105.00 pack-year smoking history. He has never used smokeless tobacco. He reports that he drinks alcohol. He reports that he does not use drugs.  Allergies: No Known Allergies  Medications:  No current facility-administered medications on file prior to encounter.    Current Outpatient Medications on File Prior to Encounter  Medication Sig Dispense Refill  . acetaminophen (TYLENOL) 500 MG tablet Take 500 mg by mouth every 6 (six) hours as needed for moderate pain.    Marland Kitchen allopurinol (ZYLOPRIM) 300 MG tablet Take 300 mg by mouth Daily.     Marland Kitchen amLODipine (NORVASC) 10 MG tablet Take 10 mg by mouth Daily.     Marland Kitchen aspirin EC 81 MG EC tablet Take 1 tablet (81 mg total) by mouth daily.    .  hydrochlorothiazide (HYDRODIURIL) 25 MG tablet Take 25 mg by mouth daily.    Marland Kitchen losartan (COZAAR) 100 MG tablet Take 100 mg by mouth daily.    . naproxen sodium (ALEVE) 220 MG tablet Take 220 mg by mouth daily as needed (pain).    . pantoprazole (PROTONIX) 40 MG tablet     . nitroGLYCERIN (NITROSTAT) 0.4 MG SL tablet Place 1 tablet (0.4 mg total) under the tongue every 5 (five) minutes x 3 doses as needed for chest pain. (Patient not taking: Reported on 10/19/2017) 25 tablet 12  . Tiotropium Bromide Monohydrate (SPIRIVA RESPIMAT) 1.25 MCG/ACT AERS Inhale 2 puffs into the lungs daily. (Patient not taking: Reported on 11/01/2017) 1 Inhaler 5    ROS: Constitutional: No fever or chills Vision: No changes in vision ENT: No difficulty swallowing CV: No chest pain Pulm: +SOB GI: No nausea or vomiting GU: No urgency or inability to hold urine Skin: No poor wound healing Neurologic: No numbness or tingling Psychiatric: No depression or anxiety Heme: No bruising Allergic: No reaction to medications or food   Exam: Blood pressure (!) 147/71, pulse (!) 58, temperature 98.7 F (37.1 C), temperature source Oral, resp. rate 18, height 6\' 1"  (1.854 m), weight (!) 141.5 kg (312 lb), SpO2 92 %. General: No acute distress Orientation: Awake alert oriented Mood and Affect: Cooperative and appropriate affect Gait: Unable to ambulate due to pain Coordination and balance: Within normal limits  Right lower extremity: Reveals a skin without obvious lesions or abrasions.  Swelling is appropriate.  Large knee effusion.  Leg is held in a valgus position with obvious deformity and a flexion contracture.  Unable to tolerate any motion of the knee.  He has obvious valgus instability of his knee.  Compartments are soft and compressible.  Active dorsiflexion plantarflexion with intact sensation to the dorsum and plantar aspect of his foot.  2+ DP pulses.  There is a small area of varicosities on his anterior  tibia.  Left lower extremity: Skin without lesions. No tenderness to palpation. Full painless ROM, full strength in each muscle groups without evidence of instability.   Medical Decision Making: Imaging: X-rays of the right knee and CT scan of the right knee shows a significant depressed and displaced lateral tibial plateau fracture.  Labs:  CBC    Component Value Date/Time   WBC 10.4 11/02/2017 0313   RBC 4.94 11/02/2017 0313   HGB 14.8 11/02/2017 0313   HCT 44.4 11/02/2017 0313   PLT 201 11/02/2017 0313   MCV 89.9 11/02/2017 0313   MCH 30.0 11/02/2017 0313   MCHC 33.3 11/02/2017 0313   RDW 13.8 11/02/2017 0313   LYMPHSABS  2.5 11/01/2017 1702   MONOABS 0.8 11/01/2017 1702   EOSABS 0.2 11/01/2017 1702   BASOSABS 0.0 11/01/2017 1702   Medical history and chart was reviewed  Assessment/Plan: 72 year old male with a right displaced lateral tibial plateau fracture with multiple medical problems.  His knee is unstable to exam.  I feel that due to his ambulatory nature and activity levels that ORIF is most appropriate course of action.  Unfortunately has multiple medical problems.  The cardiology team and pulmonology team have weighed in.  They feel that there is no further optimization to be performed.  I did discuss risks and benefits with the patient. Risks discussed included bleeding requiring blood transfusion, bleeding causing a hematoma, infection, malunion, nonunion, damage to surrounding nerves and blood vessels, pain, hardware prominence or irritation, hardware failure, stiffness, post-traumatic arthritis, DVT/PE, compartment syndrome, and even death. Patient agrees that likely surgery is best.  We will tentatively put him on the surgery schedule for tomorrow with the possibility of moving the Thursday if there is no OR availability.   Shona Needles, MD [facility] [phone] (phone)

## 2017-11-03 NOTE — H&P (View-Only) (Signed)
Orthopaedic Trauma Service (OTS) Consult   Patient ID: [name] MRN: [id number] DOB/AGE: 1946-01-25 72 y.o.  Reason for Consult:Right tibial plateau fracture Referring Physician: Dr. Lajean Saver, MD [facility]  HPI: [name] is an 72 y.o. male who is being seen in consultation at the request of Dr. Ashok Cordia  for evaluation of right tibial plateau fracture.  The patient has a significant past medical history when he was at church when he suddenly felt weak and leg gave out from underneath him and he fell to the ground.  He had immediate pain and deformity to his leg.  He was not able to ambulate on it.  He drove himself home and subsequently went to the emergency room.  X-rays showed a tibial plateau fracture with significant lateral joint displacement.  Orthopedics was consulted for evaluation.  Medical history includes obesity, COPD, coronary artery disease, obstructive sleep apnea, hypertension and hyperlipidemia.  He is ambulatory without assist device.  He has had previous injection in his right knee which have improved his symptoms.  Otherwise he has not had any significant problems with his knee.  Currently his pain is controlled when he does not move it.  It hurts anytime the movement is attempted.  Denies any numbness or tingling.  Past Medical History:  Diagnosis Date  . Arthritis    "left shoulder" (02/09/2015)  . Blind left eye   . CAD (coronary artery disease)   . Complication of anesthesia    "they give me too much anesthesia & had to put me on life support for a little bit w/gallbladder OR"  . Coronary artery disease   . Former smoker   . GERD (gastroesophageal reflux disease)   . Gout   . History of bleeding ulcers   . History of gout   . History of peptic ulcer disease   . Hyperlipidemia   . Hypertension   . Hypertensive heart disease without CHF   . Kidney stones   . Lumbar disc disease   . Myocardial infarction [facility] 1995; 2007  . Obesity   . OSA  (obstructive sleep apnea)   . Pneumonia X 2  . Prostate cancer ([place])   . Pulmonary nodule   . Second degree Mobitz I AV block     Past Surgical History:  Procedure Laterality Date  . APPENDECTOMY    . APPENDECTOMY  ~ 1985  . BACK SURGERY    . CARDIAC CATHETERIZATION N/A 02/08/2015   Procedure: Left Heart Cath and Coronary Angiography;  Surgeon: Leonie Man, MD;  Location: [place] [facility];  Service: Cardiovascular;  Laterality: N/A;  . CARDIAC CATHETERIZATION N/A 02/08/2015   Procedure: Coronary Stent Intervention;  Surgeon: Leonie Man, MD;  Location: [place] [facility];  Service: Cardiovascular;  Laterality: N/A;  . CHOLECYSTECTOMY    . CORONARY ANGIOPLASTY WITH STENT PLACEMENT  ~ 1995; 2007   "1 stent; 2 stents"  . LAPAROSCOPIC CHOLECYSTECTOMY    . LITHOTRIPSY  X 1  . LUMBAR LAMINECTOMY    . POSTERIOR LUMBAR FUSION  2003?  . PROSTATE BIOPSY  2015    Family History  Problem Relation Age of Onset  . Hypertension Mother   . Coronary artery disease Mother   . Aneurysm Father   . Hypertension Father   . COPD Sister   . Sleep apnea Unknown        3 siblings    Social History:  reports that he quit smoking about 34 years  ago. His smoking use included cigarettes. He has a 105.00 pack-year smoking history. He has never used smokeless tobacco. He reports that he drinks alcohol. He reports that he does not use drugs.  Allergies: No Known Allergies  Medications:  No current facility-administered medications on file prior to encounter.    Current Outpatient Medications on File Prior to Encounter  Medication Sig Dispense Refill  . acetaminophen (TYLENOL) 500 MG tablet Take 500 mg by mouth every 6 (six) hours as needed for moderate pain.    Marland Kitchen allopurinol (ZYLOPRIM) 300 MG tablet Take 300 mg by mouth Daily.     Marland Kitchen amLODipine (NORVASC) 10 MG tablet Take 10 mg by mouth Daily.     Marland Kitchen aspirin EC 81 MG EC tablet Take 1 tablet (81 mg total) by mouth daily.    .  hydrochlorothiazide (HYDRODIURIL) 25 MG tablet Take 25 mg by mouth daily.    Marland Kitchen losartan (COZAAR) 100 MG tablet Take 100 mg by mouth daily.    . naproxen sodium (ALEVE) 220 MG tablet Take 220 mg by mouth daily as needed (pain).    . pantoprazole (PROTONIX) 40 MG tablet     . nitroGLYCERIN (NITROSTAT) 0.4 MG SL tablet Place 1 tablet (0.4 mg total) under the tongue every 5 (five) minutes x 3 doses as needed for chest pain. (Patient not taking: Reported on 10/19/2017) 25 tablet 12  . Tiotropium Bromide Monohydrate (SPIRIVA RESPIMAT) 1.25 MCG/ACT AERS Inhale 2 puffs into the lungs daily. (Patient not taking: Reported on 11/01/2017) 1 Inhaler 5    ROS: Constitutional: No fever or chills Vision: No changes in vision ENT: No difficulty swallowing CV: No chest pain Pulm: +SOB GI: No nausea or vomiting GU: No urgency or inability to hold urine Skin: No poor wound healing Neurologic: No numbness or tingling Psychiatric: No depression or anxiety Heme: No bruising Allergic: No reaction to medications or food   Exam: Blood pressure (!) 147/71, pulse (!) 58, temperature 98.7 F (37.1 C), temperature source Oral, resp. rate 18, height 6\' 1"  (1.854 m), weight (!) 141.5 kg (312 lb), SpO2 92 %. General: No acute distress Orientation: Awake alert oriented Mood and Affect: Cooperative and appropriate affect Gait: Unable to ambulate due to pain Coordination and balance: Within normal limits  Right lower extremity: Reveals a skin without obvious lesions or abrasions.  Swelling is appropriate.  Large knee effusion.  Leg is held in a valgus position with obvious deformity and a flexion contracture.  Unable to tolerate any motion of the knee.  He has obvious valgus instability of his knee.  Compartments are soft and compressible.  Active dorsiflexion plantarflexion with intact sensation to the dorsum and plantar aspect of his foot.  2+ DP pulses.  There is a small area of varicosities on his anterior  tibia.  Left lower extremity: Skin without lesions. No tenderness to palpation. Full painless ROM, full strength in each muscle groups without evidence of instability.   Medical Decision Making: Imaging: X-rays of the right knee and CT scan of the right knee shows a significant depressed and displaced lateral tibial plateau fracture.  Labs:  CBC    Component Value Date/Time   WBC 10.4 11/02/2017 0313   RBC 4.94 11/02/2017 0313   HGB 14.8 11/02/2017 0313   HCT 44.4 11/02/2017 0313   PLT 201 11/02/2017 0313   MCV 89.9 11/02/2017 0313   MCH 30.0 11/02/2017 0313   MCHC 33.3 11/02/2017 0313   RDW 13.8 11/02/2017 0313   LYMPHSABS  2.5 11/01/2017 1702   MONOABS 0.8 11/01/2017 1702   EOSABS 0.2 11/01/2017 1702   BASOSABS 0.0 11/01/2017 1702   Medical history and chart was reviewed  Assessment/Plan: 72 year old male with a right displaced lateral tibial plateau fracture with multiple medical problems.  His knee is unstable to exam.  I feel that due to his ambulatory nature and activity levels that ORIF is most appropriate course of action.  Unfortunately has multiple medical problems.  The cardiology team and pulmonology team have weighed in.  They feel that there is no further optimization to be performed.  I did discuss risks and benefits with the patient. Risks discussed included bleeding requiring blood transfusion, bleeding causing a hematoma, infection, malunion, nonunion, damage to surrounding nerves and blood vessels, pain, hardware prominence or irritation, hardware failure, stiffness, post-traumatic arthritis, DVT/PE, compartment syndrome, and even death. Patient agrees that likely surgery is best.  We will tentatively put him on the surgery schedule for tomorrow with the possibility of moving the Thursday if there is no OR availability.   Shona Needles, MD [facility] [phone] (phone)

## 2017-11-03 NOTE — Progress Notes (Signed)
Received report from CCMD patient in 2nd degree, type 1 HB. MD notified. Confirmed with MD it is safe to administer morphine or Vicodin for pain with current pulse fluctuation.

## 2017-11-03 NOTE — Progress Notes (Signed)
PROGRESS NOTE  [facility] DZH:[id number] DOB: 1945/11/19 DOA: 11/01/2017 PCP: Maury Dus, MD   LOS: 2 days   Brief Narrative / Interim history: 72 year old male with history of obesity, chronic respiratory failure with hypoxia, obstructive sleep apnea on BiPAP, coronary artery disease with prior stents, hypertension, hyperlipidemia, chronic second-degree Mobitz 1 AV block, who sustained a mechanical fall at home on the day on admission presents with right tibial plateau fracture.  Orthopedic surgery consulted.  Pulmonology and cardiology consulted for preoperative clearance.  Plans are for patient to undergo operative repair on Thursday 5/23.  Assessment & Plan: Active Problems:   Right medial tibial plateau fracture   Right displaced tibial plateau fracture -Orthopedic surgery consulted and are following, they plan operative repair on Thursday 5/23 -Appreciate cardiology and pulmonology assistance with preoperative clearance in this complex patient -Patient reported prior difficulties during the surgical procedure  Essential hypertension -Continue Norvasc, losartan, hydrochlorothiazide and IV PRN's -Blood pressure within acceptable parameters today  Severe obstructive sleep apnea -Pulmonology following, continue BiPAP at night  ?  Asthma with wheezing -Continue duo nebs, he received Lasix x1 on 5/20 and prednisone x1 today due to scattered wheezing  Chronic respiratory failure with hypoxia -Oxygen as needed  Coronary artery disease status post stenting in the past -No chest pain, cardiology following -continue aspirin  Second-degree Mobitz type I AV block -Cardiology will further discuss with EP.  Awaiting the input.  Reflux -PPI  Chronic constipation -Continues to be a problem, increased his bowel regimen today  Chronic kidney disease stage III -Currently at baseline  Anxiety -PRN Ativan  Prior gout -Not an issue  Morbid obesity -will need significant  weight loss   DVT prophylaxis: heparin Code Status: Full code Family Communication: no family at bedside Disposition Plan: TBD  Consultants:   Cardiology   Pulmonary  Orthopedic surgery   Procedures:   None   Antimicrobials:  None    Subjective: - no chest pain, shortness of breath, no abdominal pain, nausea or vomiting.  Complains of a cough  Objective: Vitals:   11/02/17 2200 11/02/17 2230 11/03/17 0417 11/03/17 0949  BP:  138/66 (!) 147/71 (!) 143/71  Pulse: 70 62 (!) 58 (!) 108  Resp: 18 19 18    Temp:  98.1 F (36.7 C) 98.7 F (37.1 C)   TempSrc:  Oral Oral   SpO2: 95% 92% 92% 91%  Weight:      Height:        Intake/Output Summary (Last 24 hours) at 11/03/2017 1149 Last data filed at 11/03/2017 0422 Gross per 24 hour  Intake 230 ml  Output 2250 ml  Net -2020 ml   Filed Weights   11/01/17 1438  Weight: (!) 141.5 kg (312 lb)    Examination:  Constitutional: NAD Eyes: lids and conjunctivae normal Respiratory: Moves air well, scattered diffuse end expiratory wheezing present, no crackles or rhonchi.  Normal respiratory effort. Cardiovascular: Regular rate and rhythm, no murmurs / rubs / gallops. No LE edema.  Abdomen: no tenderness. Bowel sounds positive.  Skin: no rashes, lesions, ulcers. No induration Neurologic: CN 2-12 grossly intact. Strength 5/5 in all 4.  Psychiatric: Normal judgment and insight. Alert and oriented x 3. Normal mood.    Data Reviewed: I have independently reviewed following labs and imaging studies   CBC: Recent Labs  Lab 11/01/17 1702 11/02/17 0313  WBC 13.3* 10.4  NEUTROABS 9.8*  --   HGB 16.5 14.8  HCT 47.9 44.4  MCV 89.7 89.9  PLT 218 235   Basic Metabolic Panel: Recent Labs  Lab 11/01/17 1702 11/02/17 0313  NA 142  --   K 4.1  --   CL 104  --   CO2 27  --   GLUCOSE 119*  --   BUN 21*  --   CREATININE 1.39* 1.19  CALCIUM 10.7*  --    GFR: Estimated Creatinine Clearance: 81.7 mL/min (by C-G formula  based on SCr of 1.19 mg/dL). Liver Function Tests: Recent Labs  Lab 11/01/17 1702  AST 26  ALT 26  ALKPHOS 60  BILITOT 1.0  PROT 7.9  ALBUMIN 4.0   No results for input(s): LIPASE, AMYLASE in the last 168 hours. No results for input(s): AMMONIA in the last 168 hours. Coagulation Profile: Recent Labs  Lab 11/02/17 0313  INR 1.08   Cardiac Enzymes: No results for input(s): CKTOTAL, CKMB, CKMBINDEX, TROPONINI in the last 168 hours. BNP (last 3 results) Recent Labs    03/24/17 1634  PROBNP 190.0*   HbA1C: No results for input(s): HGBA1C in the last 72 hours. CBG: No results for input(s): GLUCAP in the last 168 hours. Lipid Profile: No results for input(s): CHOL, HDL, LDLCALC, TRIG, CHOLHDL, LDLDIRECT in the last 72 hours. Thyroid Function Tests: No results for input(s): TSH, T4TOTAL, FREET4, T3FREE, THYROIDAB in the last 72 hours. Anemia Panel: No results for input(s): VITAMINB12, FOLATE, FERRITIN, TIBC, IRON, RETICCTPCT in the last 72 hours. Urine analysis:    Component Value Date/Time   COLORURINE YELLOW 03/24/2009 2110   APPEARANCEUR CLEAR 03/24/2009 2110   LABSPEC 1.030 03/24/2009 2110   PHURINE 5.0 03/24/2009 2110   GLUCOSEU NEGATIVE 03/24/2009 2110   HGBUR NEGATIVE 03/24/2009 2110   BILIRUBINUR SMALL (A) 03/24/2009 2110   KETONESUR NEGATIVE 03/24/2009 2110   PROTEINUR NEGATIVE 03/24/2009 2110   UROBILINOGEN 0.2 03/24/2009 2110   NITRITE NEGATIVE 03/24/2009 2110   LEUKOCYTESUR  03/24/2009 2110    NEGATIVE MICROSCOPIC NOT DONE ON URINES WITH NEGATIVE PROTEIN, BLOOD, LEUKOCYTES, NITRITE, OR GLUCOSE <1000 mg/dL.   Sepsis Labs: Invalid input(s): PROCALCITONIN, LACTICIDVEN  No results found for this or any previous visit (from the past 240 hour(s)).    Radiology Studies: Ct Knee Right Wo Contrast  Result Date: 11/01/2017 CLINICAL DATA:  Knee fracture EXAM: CT OF THE right KNEE WITHOUT CONTRAST TECHNIQUE: Multidetector CT imaging of the right knee was  performed according to the standard protocol. Multiplanar CT image reconstructions were also generated. COMPARISON:  Radiograph 11/01/2017 FINDINGS: Bones/Joint/Cartilage Acute comminuted fracture involving the lateral plateau of the tibia with approximately 9 mm of central depression. Fracture lucency extends to the intercondylar region of the proximal tibia and the posterior articular surface of the proximal tibia. No lucency evident within the medial plateau of the tibia. Fibular head appears intact. No distal femoral fracture. No patellar fracture. Moderate hemarthrosis. Moderate arthritis of the medial knee joint with subchondral sclerosis and moderate narrowing. Mild spurring. Mild patellofemoral degenerative change with superior and inferior patellar spurring. Ligaments Suboptimally assessed by CT. Muscles and Tendons Mild muscular atrophy. Quadriceps tendon and patellar tendon appear grossly intact. Soft tissues Rim calcified lucent lesion within the popliteal fossa, may relate to dystrophic calcification or old trauma. Generalized soft tissue swelling. IMPRESSION: 1. Acute comminuted lateral tibial plateau fracture with about 9 mm central depression and extension of fracture lucency to the inter spinous region of the proximal tibia. Associated moderate hemarthrosis. 2. No other acute osseous abnormality seen. Moderate arthritis of the knee at the medial compartment. Electronically  Signed   By: Donavan Foil M.D.   On: 11/01/2017 20:04   Dg Chest Port 1 View  Result Date: 11/01/2017 CLINICAL DATA:  Near syncope. EXAM: PORTABLE CHEST 1 VIEW COMPARISON:  03/24/2017 and prior radiograph FINDINGS: Cardiomegaly noted. There is no evidence of focal airspace disease, pulmonary edema, suspicious pulmonary nodule/mass, pleural effusion, or pneumothorax. No acute bony abnormalities are identified. IMPRESSION: Cardiomegaly without evidence of acute cardiopulmonary disease. Electronically Signed   By: Margarette Canada  M.D.   On: 11/01/2017 18:21   Dg Knee Complete 4 Views Right  Result Date: 11/01/2017 CLINICAL DATA:  Right knee pain after fall. EXAM: RIGHT KNEE - COMPLETE 4+ VIEW COMPARISON:  Right knee x-rays dated November 19, 2016. FINDINGS: Acute, mildly depressed fracture of the lateral tibial plateau. No large joint effusion. Mild tricompartmental osteoarthritis. Unchanged large 3.4 cm calcified loose body likely within a Baker's cyst. Osteopenia. Soft tissues are unremarkable. IMPRESSION: 1. Acute, mildly depressed fracture of the lateral tibial plateau. Electronically Signed   By: Titus Dubin M.D.   On: 11/01/2017 15:28     Scheduled Meds: . allopurinol  300 mg Oral Daily  . amLODipine  10 mg Oral Daily  . arformoterol  15 mcg Nebulization BID  . aspirin EC  81 mg Oral Daily  . budesonide (PULMICORT) nebulizer solution  0.25 mg Nebulization BID  . heparin  5,000 Units Subcutaneous Q8H  . hydrochlorothiazide  25 mg Oral Daily  . ipratropium-albuterol  3 mL Nebulization TID  . losartan  100 mg Oral Daily  . montelukast  10 mg Oral QHS  . pantoprazole  40 mg Oral Daily  . polyethylene glycol  17 g Oral BID  . predniSONE  30 mg Oral Once  . senna  2 tablet Oral Daily   Continuous Infusions:   Marzetta Board, MD, PhD [facility] Pager [phone]  If 7PM-7AM, please contact night-coverage [email or web address] Password [facility] 11/03/2017, 11:49 AM

## 2017-11-03 NOTE — Progress Notes (Signed)
Notified by CCMD of HR at 36. Patient alert, oriented- asymptomatic.

## 2017-11-03 NOTE — Progress Notes (Signed)
Name: [name] MRN: [id number] DOB: 02-May-1946    ADMISSION DATE:  11/01/2017 CONSULTATION DATE:  11/02/17  REFERRING MD :  Dr. Algis Liming / [facility]   CHIEF COMPLAINT:  Pre-Operative Pulmonary Evaluation for Right Tibial Plateau Fracture   HISTORY OF PRESENT ILLNESS:   72 yo male former smoker with fall and acute rt comminuted lateral tibial plateau fracture.  PCCM asked to assess for pre-operative respiratory assessment.  PMHx of OSA, asthma, Mobitz 1 AV block, CAD, GERD, Gout, PUD, HLD, HTN, Nephrolithiasis.  SUBJECTIVE:  Feels BDs help.  Intermittent cough and wheeze.  Denies chest pain.  VITAL SIGNS: Temp:  [98.1 F (36.7 C)-100.6 F (38.1 C)] 98.7 F (37.1 C) (05/21 0417) Pulse Rate:  [58-108] 108 (05/21 0949) Resp:  [16-27] 18 (05/21 0417) BP: (118-174)/(57-96) 143/71 (05/21 0949) SpO2:  [91 %-97 %] 91 % (05/21 0949)  PHYSICAL EXAMINATION:  General - pleasant Eyes - pupils reactive ENT - no sinus tenderness, no oral exudate, no LAN, poor dentition Cardiac - regular, no murmur Chest - faint b/l wheeze that partially clears with coughing Abd - soft, non tender Ext - no edema Skin - no rashes Neuro - normal strength Psych - normal mood  LABS: CBC Recent Labs    11/01/17 1702 11/02/17 0313  WBC 13.3* 10.4  HGB 16.5 14.8  HCT 47.9 44.4  PLT 218 201    Coag's Recent Labs    11/02/17 0313  INR 1.08    BMET Recent Labs    11/01/17 1702 11/02/17 0313  NA 142  --   K 4.1  --   CL 104  --   CO2 27  --   BUN 21*  --   CREATININE 1.39* 1.19  GLUCOSE 119*  --     Electrolytes Recent Labs    11/01/17 1702  CALCIUM 10.7*    Sepsis Markers No results for input(s): PROCALCITON, O2SATVEN in the last 72 hours.  Invalid input(s): LACTICACIDVEN  ABG No results for input(s): PHART, PCO2ART, PO2ART in the last 72 hours.  Liver Enzymes Recent Labs    11/01/17 1702  AST 26  ALT 26  ALKPHOS 60  BILITOT 1.0  ALBUMIN 4.0    Cardiac  Enzymes No results for input(s): TROPONINI, PROBNP in the last 72 hours.  Glucose No results for input(s): GLUCAP in the last 72 hours.  Imaging Ct Knee Right Wo Contrast  Result Date: 11/01/2017 CLINICAL DATA:  Knee fracture EXAM: CT OF THE right KNEE WITHOUT CONTRAST TECHNIQUE: Multidetector CT imaging of the right knee was performed according to the standard protocol. Multiplanar CT image reconstructions were also generated. COMPARISON:  Radiograph 11/01/2017 FINDINGS: Bones/Joint/Cartilage Acute comminuted fracture involving the lateral plateau of the tibia with approximately 9 mm of central depression. Fracture lucency extends to the intercondylar region of the proximal tibia and the posterior articular surface of the proximal tibia. No lucency evident within the medial plateau of the tibia. Fibular head appears intact. No distal femoral fracture. No patellar fracture. Moderate hemarthrosis. Moderate arthritis of the medial knee joint with subchondral sclerosis and moderate narrowing. Mild spurring. Mild patellofemoral degenerative change with superior and inferior patellar spurring. Ligaments Suboptimally assessed by CT. Muscles and Tendons Mild muscular atrophy. Quadriceps tendon and patellar tendon appear grossly intact. Soft tissues Rim calcified lucent lesion within the popliteal fossa, may relate to dystrophic calcification or old trauma. Generalized soft tissue swelling. IMPRESSION: 1. Acute comminuted lateral tibial plateau fracture with about 9 mm central depression and extension  of fracture lucency to the inter spinous region of the proximal tibia. Associated moderate hemarthrosis. 2. No other acute osseous abnormality seen. Moderate arthritis of the knee at the medial compartment. Electronically Signed   By: Donavan Foil M.D.   On: 11/01/2017 20:04   Dg Chest Port 1 View  Result Date: 11/01/2017 CLINICAL DATA:  Near syncope. EXAM: PORTABLE CHEST 1 VIEW COMPARISON:  03/24/2017 and prior  radiograph FINDINGS: Cardiomegaly noted. There is no evidence of focal airspace disease, pulmonary edema, suspicious pulmonary nodule/mass, pleural effusion, or pneumothorax. No acute bony abnormalities are identified. IMPRESSION: Cardiomegaly without evidence of acute cardiopulmonary disease. Electronically Signed   By: Margarette Canada M.D.   On: 11/01/2017 18:21   Dg Knee Complete 4 Views Right  Result Date: 11/01/2017 CLINICAL DATA:  Right knee pain after fall. EXAM: RIGHT KNEE - COMPLETE 4+ VIEW COMPARISON:  Right knee x-rays dated November 19, 2016. FINDINGS: Acute, mildly depressed fracture of the lateral tibial plateau. No large joint effusion. Mild tricompartmental osteoarthritis. Unchanged large 3.4 cm calcified loose body likely within a Baker's cyst. Osteopenia. Soft tissues are unremarkable. IMPRESSION: 1. Acute, mildly depressed fracture of the lateral tibial plateau. Electronically Signed   By: Titus Dubin M.D.   On: 11/01/2017 15:28    STUDIES: PSG 04/07/14 >> AHI 97 PFT 04/08/17 >> mixed obstruction and restriction with + bronchodilator response, mild diffusion defect that corrects for lung volumes Echo 04/15/17 >> mod LVH, EF 60 to 65%, PAS 36 mmHg  SIGNIFICANT EVENTS  5/19  Admit post fall with R tibial plateau fracture  DISCUSSION: 72 yo with Rt tibial fx.  Has hx of smoking, asthma, OSA.  Pulmonary status okay for him to proceed with surgery.  ASSESSMENT / PLAN:  Asthma. - scheduled duoneb for now - add singulair, pulmicort, brovana - prednisone 30 mg x one on 5/21 - prn albuterol  Obstructive sleep apnea. - Bipap 12/8 cm H2O qhs  Atelectasis. - bronchial hygiene  DVT prophylaxis - SQ heparin SUP - not indicated Nutrition - heart healthy diet Goals of care - full code  Updated pt's family at bedside  PCCM can be available as needed after he has surgery >> call if help needed.  Chesley Mires, MD [facility] Pulmonary/Critical Care 11/03/2017, 10:27 AM

## 2017-11-03 NOTE — Progress Notes (Signed)
Still with first and second degree heart block with narrow complex.  Discussed with EP.  No need for pacer prior to surgery.  Kerry Hough MD [facility]

## 2017-11-04 ENCOUNTER — Encounter (HOSPITAL_COMMUNITY): Payer: Self-pay | Admitting: Orthopedic Surgery

## 2017-11-04 DIAGNOSIS — S82131A Displaced fracture of medial condyle of right tibia, initial encounter for closed fracture: Secondary | ICD-10-CM

## 2017-11-04 LAB — BASIC METABOLIC PANEL
Anion gap: 9 (ref 5–15)
BUN: 23 mg/dL — AB (ref 6–20)
CHLORIDE: 99 mmol/L — AB (ref 101–111)
CO2: 28 mmol/L (ref 22–32)
Calcium: 10 mg/dL (ref 8.9–10.3)
Creatinine, Ser: 1.45 mg/dL — ABNORMAL HIGH (ref 0.61–1.24)
GFR calc Af Amer: 54 mL/min — ABNORMAL LOW (ref >60)
GFR calc non Af Amer: 46 mL/min — ABNORMAL LOW (ref >60)
GLUCOSE: 119 mg/dL — AB (ref 65–99)
POTASSIUM: 3.9 mmol/L (ref 3.5–5.1)
Sodium: 136 mmol/L (ref 135–145)

## 2017-11-04 LAB — CBC
HCT: 45.3 % (ref 39.0–52.0)
HEMOGLOBIN: 14.9 g/dL (ref 13.0–17.0)
MCH: 29.4 pg (ref 26.0–34.0)
MCHC: 32.9 g/dL (ref 30.0–36.0)
MCV: 89.5 fL (ref 78.0–100.0)
Platelets: 193 10*3/uL (ref 150–400)
RBC: 5.06 MIL/uL (ref 4.22–5.81)
RDW: 13.5 % (ref 11.5–15.5)
WBC: 10.2 10*3/uL (ref 4.0–10.5)

## 2017-11-04 MED ORDER — DEXTROSE 5 % IV SOLN
3.0000 g | INTRAVENOUS | Status: AC
  Administered 2017-11-05: 3 g via INTRAVENOUS
  Filled 2017-11-04: qty 3

## 2017-11-04 MED ORDER — LACTULOSE 10 GM/15ML PO SOLN
20.0000 g | ORAL | Status: AC
  Administered 2017-11-04: 20 g via ORAL
  Filled 2017-11-04 (×2): qty 30

## 2017-11-04 NOTE — Progress Notes (Signed)
Due to urgent cases that came in overnight will be unable to complete surgery today will need to postpone his surgery for tomorrow AM. Discussed with patient and he is in agreement.  Shona Needles, MD [facility] [phone] (phone)

## 2017-11-04 NOTE — Progress Notes (Signed)
Orthopedic Trauma Service Progress Note   Patient ID: [name] MRN: [id number] DOB/AGE: April 08, 1946 72 y.o.  Subjective:  Doing fine Understanding as to why surgery delayed for today (acute trauma came in over night that requires emergent intervention which is being addressed by Dr. Doreatha Martin and Dr. Marcelino Scot has clinic all day)  Had large BM this morning, feels "100% better"   Chronic numbness and tingling in B LEx is unchanged   No other complaints   ROS As above  Objective:   VITALS:   Vitals:   11/04/17 0901 11/04/17 0907 11/04/17 1017 11/04/17 1023  BP:   108/60   Pulse:   79   Resp:      Temp:      TempSrc:      SpO2: 98% 97% 91% 93%  Weight:      Height:        Estimated body mass index is 41.16 kg/m as calculated from the following:   Height as of this encounter: 6\' 1"  (1.854 m).   Weight as of this encounter: 141.5 kg (312 lb).   Intake/Output      05/21 0701 - 05/22 0700 05/22 0701 - 05/23 0700   P.O. 380    Total Intake(mL/kg) 380 (2.7)    Urine (mL/kg/hr) 850 (0.3) 200 (0.3)   Stool  0   Total Output 850 200   Net -470 -200        Urine Occurrence  1 x   Stool Occurrence  3 x     LABS  Results for orders placed or performed during the hospital encounter of 11/01/17 (from the past 24 hour(s))  CBC     Status: None   Collection Time: 11/04/17  3:51 AM  Result Value Ref Range   WBC 10.2 4.0 - 10.5 K/uL   RBC 5.06 4.22 - 5.81 MIL/uL   Hemoglobin 14.9 13.0 - 17.0 g/dL   HCT 45.3 39.0 - 52.0 %   MCV 89.5 78.0 - 100.0 fL   MCH 29.4 26.0 - 34.0 pg   MCHC 32.9 30.0 - 36.0 g/dL   RDW 13.5 11.5 - 15.5 %   Platelets 193 150 - 400 K/uL  Basic metabolic panel     Status: Abnormal   Collection Time: 11/04/17  3:51 AM  Result Value Ref Range   Sodium 136 135 - 145 mmol/L   Potassium 3.9 3.5 - 5.1 mmol/L   Chloride 99 (L) 101 - 111 mmol/L   CO2 28 22 - 32 mmol/L   Glucose, Bld 119 (H) 65 - 99 mg/dL   BUN 23 (H) 6 - 20  mg/dL   Creatinine, Ser 1.45 (H) 0.61 - 1.24 mg/dL   Calcium 10.0 8.9 - 10.3 mg/dL   GFR calc non Af Amer 46 (L) >60 mL/min   GFR calc Af Amer 54 (L) >60 mL/min   Anion gap 9 5 - 15     PHYSICAL EXAM:   Gen: NAD, resting comfortably in bed  Lungs: breathing unlabored  Abd: obese, NT Ext:       Right Lower Extremity   Knee immobilizer in place   No compressive garments   No open wounds or lesions around the proximal tibia   Ext warm   Diminished peripheral pulses, pt has soft tissues changes consistent with PVD  Large varicosities lower leg   Nickel sized lesion distal aspect lower leg at the distal extent of the varicosity     Skin colored  Thin walled    No drainage     Do not think this is part of pts varicosity but may be   Soft tissues wrinkles easily over lateral plateau   DPN, SPN sensation intact  TN sensation diminished but this is baseline  EHL, FHL, AT, PT, peroneals, gastroc motor grossly intact   No pain with passive stretch   Compartments soft   Assessment/Plan:     Active Problems:   Closed fracture of lateral portion of right tibial plateau   Anti-infectives (From admission, onward)   Start     Dose/Rate Route Frequency Ordered Stop   11/05/17 0800  ceFAZolin (ANCEF) 3 g in dextrose 5 % 50 mL IVPB     3 g 100 mL/hr over 30 Minutes Intravenous On call to O.R. 11/04/17 1016 11/06/17 0559    .  POD/HD#: 51  72 y/o male s/p fall with R lateral tibial plateau fracture   -fall  - R lateral tibial plateau fracture, split depression, shatzker 2   OR tomorrow for ORIF  Ice and elevate for today  NWB   Ok to leave immobilizer unstrapped when in bed   Immobilizer on when transferring   PT/OT   - Pain management:  Continue with current regimen   - ABL anemia/Hemodynamics  Stable  - Medical issues   Per primary   - DVT/PE prophylaxis:  Heparin  - ID:   periop abx  - Metabolic Bone Disease:  Low energy fracture   Suspect poor bone  quality   Check labs  - Activity:  NWB R leg  - FEN/GI prophylaxis/Foley/Lines:  NPO after MN    - Dispo:  OR tomorrow for ORIF R tibial plateau     Jari Pigg, PA-C [facility] [phone] (P) [phone] Levi Aland (C) 11/04/2017, 12:11 PM

## 2017-11-04 NOTE — Care Management Important Message (Signed)
Important Message  Patient Details  Name: [name] MRN: [id number] Date of Birth: Feb 20, 1946   Medicare Important Message Given:  Yes    Marely Apgar 11/04/2017, 2:00 PM

## 2017-11-04 NOTE — Progress Notes (Signed)
Orthopedic Tech Progress Note Patient Details:  [name] [birth year] [id number]  Ortho Devices Type of Ortho Device: Ace wrap, Knee Immobilizer, Cotton web roll, Doran Durand splint Ortho Device/Splint Location: trapeze bar patient helper Ortho Device/Splint Interventions: Application   Post Interventions Patient Tolerated: Well Instructions Provided: Care of device   Hildred Priest 11/04/2017, 11:59 AM

## 2017-11-04 NOTE — Progress Notes (Signed)
PROGRESS NOTE    [name]  ZJI:[id number] DOB: Jul 13, 1945 DOA: 11/01/2017 PCP: Maury Dus, MD  Brief Narrative:72 year old male with history of obesity, chronic respiratory failure with hypoxia, obstructive sleep apnea on BiPAP, coronary artery disease with prior stents, hypertension, hyperlipidemia, chronic second-degree Mobitz 1 AV block, who sustained a mechanical fall at home on the day on admission presents with right tibial plateau fracture.  Orthopedic surgery consulted.  Pulmonology and cardiology consulted for preoperative clearance.  Plans are for patient to undergo operative repair on Thursday 5/23.     Assessment & Plan:   Active Problems:   Closed fracture of lateral portion of right tibial plateau  Right displaced tibial plateau fracture -Orthopedic surgery consulted and are following, they plan operative repair on Thursday 5/23 -Appreciate cardiology and pulmonology assistance with preoperative clearance in this complex patient -Patient reported prior difficulties during the surgical procedure  Essential hypertension-hold cozar and hctz -Continue Norvasc,and IV PRN's -Blood pressure within acceptable parameters today  Severe obstructive sleep apnea -Pulmonology following, continue BiPAP at night  ?  Asthma with wheezing -Continue duo nebs, he received Lasix x1 on 5/20 and prednisone x1 today due to scattered wheezing  Chronic respiratory failure with hypoxia -Oxygen as needed  Coronary artery disease status post stenting in the past -No chest pain, cardiology following -continue aspirin  Second-degree Mobitz type I AV block -Cardiology will further discuss with EP.  Awaiting the input.  Reflux -PPI   Chronic constipation-lactulose ordered for today -Continues to be a problem, increased his bowel regimen today  Chronic kidney disease stage III -trending up.hold cozaar,hctz - Anxiety -PRN Ativan  Prior gout -Not an  issue  Morbid obesity -will need significant weight loss    DVT prophylaxis: heparin Code Status full Family Communication:none Disposition Plan: tbd Consultants: ortho,cards,pulm  Procedures:none Antimicrobials: none Subjective:complaints of constipation   Objective: Vitals:   11/04/17 0901 11/04/17 0907 11/04/17 1017 11/04/17 1023  BP:   108/60   Pulse:   79   Resp:      Temp:      TempSrc:      SpO2: 98% 97% 91% 93%  Weight:      Height:        Intake/Output Summary (Last 24 hours) at 11/04/2017 1350 Last data filed at 11/04/2017 1751 Gross per 24 hour  Intake 260 ml  Output 850 ml  Net -590 ml   Filed Weights   11/01/17 1438  Weight: (!) 141.5 kg (312 lb)    Examination:  General exam: Appears calm and comfortable  Respiratory system: Clear to auscultation. Respiratory effort normal. Cardiovascular system: S1 & S2 heard, RRR. No JVD, murmurs, rubs, gallops or clicks. No pedal edema. Gastrointestinal system: Abdomen is distended, soft and nontender. No organomegaly or masses felt. Normal bowel sounds heard. Central nervous system: Alert and oriented. No focal neurological deficits. Extremities: Symmetric 5 x 5 power. Skin: No rashes, lesions or ulcers Psychiatry: Judgement and insight appear normal. Mood & affect appropriate.     Data Reviewed: I have personally reviewed following labs and imaging studies  CBC: Recent Labs  Lab 11/01/17 1702 11/02/17 0313 11/04/17 0351  WBC 13.3* 10.4 10.2  NEUTROABS 9.8*  --   --   HGB 16.5 14.8 14.9  HCT 47.9 44.4 45.3  MCV 89.7 89.9 89.5  PLT 218 201 025   Basic Metabolic Panel: Recent Labs  Lab 11/01/17 1702 11/02/17 0313 11/04/17 0351  NA 142  --  136  K 4.1  --  3.9  CL 104  --  99*  CO2 27  --  28  GLUCOSE 119*  --  119*  BUN 21*  --  23*  CREATININE 1.39* 1.19 1.45*  CALCIUM 10.7*  --  10.0   GFR: Estimated Creatinine Clearance: 67.1 mL/min (A) (by C-G formula based on SCr of 1.45  mg/dL (H)). Liver Function Tests: Recent Labs  Lab 11/01/17 1702  AST 26  ALT 26  ALKPHOS 60  BILITOT 1.0  PROT 7.9  ALBUMIN 4.0   No results for input(s): LIPASE, AMYLASE in the last 168 hours. No results for input(s): AMMONIA in the last 168 hours. Coagulation Profile: Recent Labs  Lab 11/02/17 0313  INR 1.08   Cardiac Enzymes: No results for input(s): CKTOTAL, CKMB, CKMBINDEX, TROPONINI in the last 168 hours. BNP (last 3 results) Recent Labs    03/24/17 1634  PROBNP 190.0*   HbA1C: No results for input(s): HGBA1C in the last 72 hours. CBG: No results for input(s): GLUCAP in the last 168 hours. Lipid Profile: No results for input(s): CHOL, HDL, LDLCALC, TRIG, CHOLHDL, LDLDIRECT in the last 72 hours. Thyroid Function Tests: No results for input(s): TSH, T4TOTAL, FREET4, T3FREE, THYROIDAB in the last 72 hours. Anemia Panel: No results for input(s): VITAMINB12, FOLATE, FERRITIN, TIBC, IRON, RETICCTPCT in the last 72 hours. Sepsis Labs: No results for input(s): PROCALCITON, LATICACIDVEN in the last 168 hours.  Recent Results (from the past 240 hour(s))  Surgical pcr screen     Status: None   Collection Time: 11/03/17 11:23 AM  Result Value Ref Range Status   MRSA, PCR NEGATIVE NEGATIVE Final   Staphylococcus aureus NEGATIVE NEGATIVE Final    Comment: (NOTE) The Xpert SA Assay (FDA approved for NASAL specimens in patients 6 years of age and older), is one component of a comprehensive surveillance program. It is not intended to diagnose infection nor to guide or monitor treatment. Performed at [facility], [place] [address], [place], [place] [address]          Radiology Studies: No results found.      Scheduled Meds: . allopurinol  300 mg Oral Daily  . amLODipine  10 mg Oral Daily  . arformoterol  15 mcg Nebulization BID  . aspirin EC  81 mg Oral Daily  . budesonide (PULMICORT) nebulizer solution  0.25 mg Nebulization BID  . heparin  5,000  Units Subcutaneous Q8H  . hydrochlorothiazide  25 mg Oral Daily  . ipratropium-albuterol  3 mL Nebulization TID  . losartan  100 mg Oral Daily  . montelukast  10 mg Oral QHS  . pantoprazole  40 mg Oral Daily  . polyethylene glycol  17 g Oral BID  . predniSONE  30 mg Oral Once  . senna  2 tablet Oral Daily   Continuous Infusions: . [START ON 11/05/2017]  ceFAZolin (ANCEF) IV       LOS: 3 days     Georgette Shell, MD [facility] If 7PM-7AM, please contact night-coverage [email or web address] Password [email or web address] 11/04/2017, 1:50 PM

## 2017-11-05 ENCOUNTER — Encounter (HOSPITAL_COMMUNITY)
Admission: EM | Disposition: A | Discharge status: Skilled Nursing Facility | Payer: Self-pay | Source: Home / Self Care | Attending: Internal Medicine

## 2017-11-05 ENCOUNTER — Encounter (HOSPITAL_COMMUNITY): Payer: Self-pay | Admitting: *Deleted

## 2017-11-05 ENCOUNTER — Inpatient Hospital Stay (HOSPITAL_COMMUNITY): Payer: Medicare HMO | Admitting: Certified Registered"

## 2017-11-05 ENCOUNTER — Inpatient Hospital Stay (HOSPITAL_COMMUNITY): Payer: Medicare HMO

## 2017-11-05 HISTORY — PX: ORIF TIBIA PLATEAU: SHX2132

## 2017-11-05 LAB — COMPREHENSIVE METABOLIC PANEL
ALT: 31 U/L (ref 17–63)
AST: 26 U/L (ref 15–41)
Albumin: 3.1 g/dL — ABNORMAL LOW (ref 3.5–5.0)
Alkaline Phosphatase: 50 U/L (ref 38–126)
Anion gap: 9 (ref 5–15)
BILIRUBIN TOTAL: 1.2 mg/dL (ref 0.3–1.2)
BUN: 28 mg/dL — AB (ref 6–20)
CHLORIDE: 99 mmol/L — AB (ref 101–111)
CO2: 28 mmol/L (ref 22–32)
Calcium: 10.1 mg/dL (ref 8.9–10.3)
Creatinine, Ser: 1.48 mg/dL — ABNORMAL HIGH (ref 0.61–1.24)
GFR calc Af Amer: 52 mL/min — ABNORMAL LOW (ref >60)
GFR calc non Af Amer: 45 mL/min — ABNORMAL LOW (ref >60)
Glucose, Bld: 111 mg/dL — ABNORMAL HIGH (ref 65–99)
POTASSIUM: 4 mmol/L (ref 3.5–5.1)
Sodium: 136 mmol/L (ref 135–145)
TOTAL PROTEIN: 6.4 g/dL — AB (ref 6.5–8.1)

## 2017-11-05 LAB — CBC
HEMATOCRIT: 44.8 % (ref 39.0–52.0)
Hemoglobin: 14.6 g/dL (ref 13.0–17.0)
MCH: 29.4 pg (ref 26.0–34.0)
MCHC: 32.6 g/dL (ref 30.0–36.0)
MCV: 90.1 fL (ref 78.0–100.0)
Platelets: 202 10*3/uL (ref 150–400)
RBC: 4.97 MIL/uL (ref 4.22–5.81)
RDW: 13.6 % (ref 11.5–15.5)
WBC: 9.6 10*3/uL (ref 4.0–10.5)

## 2017-11-05 LAB — PROTIME-INR
INR: 1.05
Prothrombin Time: 13.6 seconds (ref 11.4–15.2)

## 2017-11-05 SURGERY — OPEN REDUCTION INTERNAL FIXATION (ORIF) TIBIAL PLATEAU
Anesthesia: General | Site: Knee | Laterality: Right

## 2017-11-05 MED ORDER — OXYCODONE-ACETAMINOPHEN 5-325 MG PO TABS
1.0000 | ORAL_TABLET | ORAL | Status: DC | PRN
Start: 2017-11-05 — End: 2017-11-11
  Administered 2017-11-05 – 2017-11-11 (×20): 1 via ORAL
  Filled 2017-11-05 (×21): qty 1

## 2017-11-05 MED ORDER — LIDOCAINE 2% (20 MG/ML) 5 ML SYRINGE
INTRAMUSCULAR | Status: AC
Start: 2017-11-05 — End: ?
  Filled 2017-11-05: qty 5

## 2017-11-05 MED ORDER — LACTATED RINGERS IV SOLN
INTRAVENOUS | Status: DC
  Administered 2017-11-05 – 2017-11-07 (×2): via INTRAVENOUS

## 2017-11-05 MED ORDER — SUCCINYLCHOLINE CHLORIDE 200 MG/10ML IV SOSY
PREFILLED_SYRINGE | INTRAVENOUS | Status: DC | PRN
  Administered 2017-11-05: 140 mg via INTRAVENOUS

## 2017-11-05 MED ORDER — FENTANYL CITRATE (PF) 100 MCG/2ML IJ SOLN
INTRAMUSCULAR | Status: AC
  Filled 2017-11-05: qty 2

## 2017-11-05 MED ORDER — PROPOFOL 10 MG/ML IV BOLUS
INTRAVENOUS | Status: AC
  Filled 2017-11-05: qty 20

## 2017-11-05 MED ORDER — OXYCODONE HCL 5 MG/5ML PO SOLN: 5.0000 mg | Freq: Once | ORAL | Status: AC | PRN

## 2017-11-05 MED ORDER — OXYCODONE HCL 5 MG PO TABS
5.0000 mg | ORAL_TABLET | Freq: Once | ORAL | Status: AC | PRN
  Administered 2017-11-05: 5 mg via ORAL

## 2017-11-05 MED ORDER — ONDANSETRON HCL 4 MG/2ML IJ SOLN
INTRAMUSCULAR | Status: AC
  Filled 2017-11-05: qty 2

## 2017-11-05 MED ORDER — FENTANYL CITRATE (PF) 250 MCG/5ML IJ SOLN
INTRAMUSCULAR | Status: DC | PRN
  Administered 2017-11-05 (×2): 50 ug via INTRAVENOUS

## 2017-11-05 MED ORDER — 0.9 % SODIUM CHLORIDE (POUR BTL) OPTIME
TOPICAL | Status: DC | PRN
  Administered 2017-11-05: 1000 mL

## 2017-11-05 MED ORDER — DEXAMETHASONE SODIUM PHOSPHATE 10 MG/ML IJ SOLN
INTRAMUSCULAR | Status: AC
  Filled 2017-11-05: qty 1

## 2017-11-05 MED ORDER — DEXAMETHASONE SODIUM PHOSPHATE 10 MG/ML IJ SOLN
INTRAMUSCULAR | Status: DC | PRN
  Administered 2017-11-05: 5 mg via INTRAVENOUS

## 2017-11-05 MED ORDER — ROCURONIUM BROMIDE 10 MG/ML (PF) SYRINGE
PREFILLED_SYRINGE | INTRAVENOUS | Status: AC
  Filled 2017-11-05: qty 5

## 2017-11-05 MED ORDER — ONDANSETRON HCL 4 MG/2ML IJ SOLN
4.0000 mg | Freq: Once | INTRAMUSCULAR | Status: AC | PRN
  Administered 2017-11-05: 4 mg via INTRAVENOUS

## 2017-11-05 MED ORDER — DEXMEDETOMIDINE HCL IN NACL 200 MCG/50ML IV SOLN
INTRAVENOUS | Status: AC
  Filled 2017-11-05: qty 100

## 2017-11-05 MED ORDER — ROCURONIUM BROMIDE 10 MG/ML (PF) SYRINGE
PREFILLED_SYRINGE | INTRAVENOUS | Status: DC | PRN
  Administered 2017-11-05: 50 mg via INTRAVENOUS
  Administered 2017-11-05: 10 mg via INTRAVENOUS
  Administered 2017-11-05: 20 mg via INTRAVENOUS

## 2017-11-05 MED ORDER — BACITRACIN 500 UNIT/GM EX OINT
TOPICAL_OINTMENT | CUTANEOUS | Status: DC | PRN
  Administered 2017-11-05: 1 via TOPICAL

## 2017-11-05 MED ORDER — VANCOMYCIN HCL 1000 MG IV SOLR
INTRAVENOUS | Status: AC
  Filled 2017-11-05: qty 1000

## 2017-11-05 MED ORDER — TOBRAMYCIN SULFATE 1.2 G IJ SOLR
INTRAMUSCULAR | Status: AC
  Filled 2017-11-05: qty 1.2

## 2017-11-05 MED ORDER — PHENYLEPHRINE 40 MCG/ML (10ML) SYRINGE FOR IV PUSH (FOR BLOOD PRESSURE SUPPORT)
PREFILLED_SYRINGE | INTRAVENOUS | Status: DC | PRN
  Administered 2017-11-05: 120 ug via INTRAVENOUS

## 2017-11-05 MED ORDER — BACITRACIN ZINC 500 UNIT/GM EX OINT
TOPICAL_OINTMENT | CUTANEOUS | Status: AC
  Filled 2017-11-05: qty 28.35

## 2017-11-05 MED ORDER — PROPOFOL 10 MG/ML IV BOLUS
INTRAVENOUS | Status: DC | PRN
  Administered 2017-11-05: 150 mg via INTRAVENOUS

## 2017-11-05 MED ORDER — PROMETHAZINE HCL 25 MG/ML IJ SOLN
12.5000 mg | Freq: Four times a day (QID) | INTRAMUSCULAR | Status: DC | PRN
  Administered 2017-11-09: 12.5 mg via INTRAVENOUS
  Filled 2017-11-05: qty 1

## 2017-11-05 MED ORDER — ONDANSETRON HCL 4 MG/2ML IJ SOLN
INTRAMUSCULAR | Status: DC | PRN
  Administered 2017-11-05: 4 mg via INTRAVENOUS

## 2017-11-05 MED ORDER — FENTANYL CITRATE (PF) 250 MCG/5ML IJ SOLN
INTRAMUSCULAR | Status: AC
  Filled 2017-11-05: qty 5

## 2017-11-05 MED ORDER — CEFAZOLIN SODIUM 1 G IJ SOLR
INTRAMUSCULAR | Status: AC
  Filled 2017-11-05: qty 40

## 2017-11-05 MED ORDER — LIDOCAINE 2% (20 MG/ML) 5 ML SYRINGE
INTRAMUSCULAR | Status: DC | PRN
  Administered 2017-11-05: 40 mg via INTRAVENOUS

## 2017-11-05 MED ORDER — OXYCODONE HCL 5 MG PO TABS
ORAL_TABLET | ORAL | Status: AC
  Filled 2017-11-05: qty 1

## 2017-11-05 MED ORDER — TOBRAMYCIN SULFATE 1.2 G IJ SOLR
INTRAMUSCULAR | Status: DC | PRN
  Administered 2017-11-05: 1.2 g via TOPICAL

## 2017-11-05 MED ORDER — ENOXAPARIN SODIUM 40 MG/0.4ML ~~LOC~~ SOLN
40.0000 mg | SUBCUTANEOUS | Status: DC
  Administered 2017-11-06 – 2017-11-11 (×6): 40 mg via SUBCUTANEOUS
  Filled 2017-11-05 (×6): qty 0.4

## 2017-11-05 MED ORDER — SUGAMMADEX SODIUM 500 MG/5ML IV SOLN
INTRAVENOUS | Status: AC
  Filled 2017-11-05: qty 5

## 2017-11-05 MED ORDER — CEFAZOLIN SODIUM-DEXTROSE 2-4 GM/100ML-% IV SOLN
2.0000 g | Freq: Three times a day (TID) | INTRAVENOUS | Status: AC
  Administered 2017-11-05 – 2017-11-06 (×3): 2 g via INTRAVENOUS
  Filled 2017-11-05 (×3): qty 100

## 2017-11-05 MED ORDER — VANCOMYCIN HCL 1000 MG IV SOLR
INTRAVENOUS | Status: DC | PRN
  Administered 2017-11-05: 1 g via TOPICAL

## 2017-11-05 MED ORDER — FENTANYL CITRATE (PF) 100 MCG/2ML IJ SOLN
25.0000 ug | INTRAMUSCULAR | Status: DC | PRN
  Administered 2017-11-05 (×3): 50 ug via INTRAVENOUS

## 2017-11-05 SURGICAL SUPPLY — 79 items
BANDAGE ACE 4X5 VEL STRL LF (GAUZE/BANDAGES/DRESSINGS) ×3 IMPLANT
BANDAGE ACE 6X5 VEL STRL LF (GAUZE/BANDAGES/DRESSINGS) ×3 IMPLANT
BANDAGE ESMARK 6X9 LF (GAUZE/BANDAGES/DRESSINGS) ×1 IMPLANT
BIT DRILL 2.5 X LONG (BIT) ×1
BIT DRILL CALIBR QC 2.8X250 (BIT) ×2 IMPLANT
BIT DRILL X LONG 2.5 (BIT) IMPLANT
BLADE CLIPPER SURG (BLADE) IMPLANT
BLADE SURG 15 STRL LF DISP TIS (BLADE) ×1 IMPLANT
BLADE SURG 15 STRL SS (BLADE) ×3
BNDG CMPR 9X6 STRL LF SNTH (GAUZE/BANDAGES/DRESSINGS) ×1
BNDG CMPR MED 10X6 ELC LF (GAUZE/BANDAGES/DRESSINGS) ×1
BNDG ELASTIC 6X10 VLCR STRL LF (GAUZE/BANDAGES/DRESSINGS) ×2 IMPLANT
BNDG ESMARK 6X9 LF (GAUZE/BANDAGES/DRESSINGS) ×3
BNDG GAUZE ELAST 4 BULKY (GAUZE/BANDAGES/DRESSINGS) ×3 IMPLANT
BONE CANC CHIPS 20CC PCAN1/4 (Bone Implant) ×3 IMPLANT
BRUSH SCRUB SURG 4.25 DISP (MISCELLANEOUS) ×6 IMPLANT
CANISTER SUCT 3000ML PPV (MISCELLANEOUS) ×3 IMPLANT
CHIPS CANC BONE 20CC PCAN1/4 (Bone Implant) ×1 IMPLANT
CHLORAPREP W/TINT 26ML (MISCELLANEOUS) ×6 IMPLANT
COVER SURGICAL LIGHT HANDLE (MISCELLANEOUS) ×3 IMPLANT
CUFF TOURNIQUET SINGLE 34IN LL (TOURNIQUET CUFF) ×3 IMPLANT
DRAPE C-ARM 42X72 X-RAY (DRAPES) ×3 IMPLANT
DRAPE C-ARMOR (DRAPES) ×3 IMPLANT
DRAPE ORTHO SPLIT 77X108 STRL (DRAPES) ×6
DRAPE SURG ORHT 6 SPLT 77X108 (DRAPES) ×2 IMPLANT
DRAPE U-SHAPE 47X51 STRL (DRAPES) ×3 IMPLANT
DRILL BIT X LONG 2.5 (BIT) ×3
DRSG ADAPTIC 3X8 NADH LF (GAUZE/BANDAGES/DRESSINGS) ×2 IMPLANT
DRSG PAD ABDOMINAL 8X10 ST (GAUZE/BANDAGES/DRESSINGS) ×6 IMPLANT
ELECT REM PT RETURN 9FT ADLT (ELECTROSURGICAL) ×3
ELECTRODE REM PT RTRN 9FT ADLT (ELECTROSURGICAL) ×1 IMPLANT
GAUZE SPONGE 4X4 12PLY STRL (GAUZE/BANDAGES/DRESSINGS) ×3 IMPLANT
GLOVE BIO SURGEON STRL SZ7.5 (GLOVE) ×12 IMPLANT
GLOVE BIOGEL PI IND STRL 7.5 (GLOVE) ×1 IMPLANT
GLOVE BIOGEL PI INDICATOR 7.5 (GLOVE) ×2
GOWN STRL REUS W/ TWL LRG LVL3 (GOWN DISPOSABLE) ×2 IMPLANT
GOWN STRL REUS W/TWL LRG LVL3 (GOWN DISPOSABLE) ×6
GRAFT BNE CANC CHIPS 1-8 20CC (Bone Implant) IMPLANT
IMMOBILIZER KNEE 22 UNIV (SOFTGOODS) ×3 IMPLANT
K-WIRE .62 (WIRE) ×8 IMPLANT
KIT BASIN OR (CUSTOM PROCEDURE TRAY) ×3 IMPLANT
KIT TURNOVER KIT B (KITS) ×3 IMPLANT
NDL SUT 6 .5 CRC .975X.05 MAYO (NEEDLE) ×1 IMPLANT
NEEDLE MAYO TAPER (NEEDLE) ×3
NS IRRIG 1000ML POUR BTL (IV SOLUTION) ×3 IMPLANT
PACK TOTAL JOINT (CUSTOM PROCEDURE TRAY) ×3 IMPLANT
PAD ARMBOARD 7.5X6 YLW CONV (MISCELLANEOUS) ×6 IMPLANT
PAD CAST 4YDX4 CTTN HI CHSV (CAST SUPPLIES) ×1 IMPLANT
PADDING CAST COTTON 4X4 STRL (CAST SUPPLIES) ×3
PADDING CAST COTTON 6X4 STRL (CAST SUPPLIES) ×3 IMPLANT
PLATE TIBIA VA-LCP 6H RT (Plate) ×2 IMPLANT
SCREW HEADED ST 3.5X40 (Screw) ×2 IMPLANT
SCREW HEADED ST 3.5X46 (Screw) ×2 IMPLANT
SCREW HEADED ST 3.5X58 (Screw) ×2 IMPLANT
SCREW HEADED ST 3.5X64 (Screw) ×2 IMPLANT
SCREW HEADED ST 3.5X85 (Screw) ×2 IMPLANT
SCREW HEADED ST 3.5X90 (Screw) ×2 IMPLANT
SCREW HEADED ST 3.5X95 (Screw) ×2 IMPLANT
SCREW LOCKING VA 3.5X75MM (Screw) ×2 IMPLANT
SCREW LOCKING VA 3.5X85MM (Screw) ×2 IMPLANT
SCREW LOCKING VA 3.5X95MM (Screw) ×2 IMPLANT
STAPLER VISISTAT 35W (STAPLE) ×3 IMPLANT
SUCTION FRAZIER HANDLE 10FR (MISCELLANEOUS) ×2
SUCTION TUBE FRAZIER 10FR DISP (MISCELLANEOUS) ×1 IMPLANT
SUT ETHILON 2 0 FS 18 (SUTURE) ×3 IMPLANT
SUT ETHILON 3 0 PS 1 (SUTURE) IMPLANT
SUT FIBERWIRE #2 38 T-5 BLUE (SUTURE)
SUT VIC AB 0 CT1 27 (SUTURE)
SUT VIC AB 0 CT1 27XBRD ANBCTR (SUTURE) IMPLANT
SUT VIC AB 1 CT1 18XCR BRD 8 (SUTURE) IMPLANT
SUT VIC AB 1 CT1 27 (SUTURE) ×3
SUT VIC AB 1 CT1 27XBRD ANBCTR (SUTURE) ×1 IMPLANT
SUT VIC AB 1 CT1 8-18 (SUTURE) ×3
SUT VIC AB 2-0 CT1 27 (SUTURE) ×6
SUT VIC AB 2-0 CT1 TAPERPNT 27 (SUTURE) ×2 IMPLANT
SUTURE FIBERWR #2 38 T-5 BLUE (SUTURE) IMPLANT
TOWEL OR 17X26 10 PK STRL BLUE (TOWEL DISPOSABLE) ×6 IMPLANT
TRAY FOLEY MTR SLVR 16FR STAT (SET/KITS/TRAYS/PACK) IMPLANT
WATER STERILE IRR 1000ML POUR (IV SOLUTION) ×6 IMPLANT

## 2017-11-05 NOTE — Anesthesia Postprocedure Evaluation (Signed)
Anesthesia Post Note  Patient: Giani R Manring  Procedure(s) Performed: OPEN REDUCTION INTERNAL FIXATION (ORIF) TIBIAL PLATEAU (Right Knee)     Patient location during evaluation: PACU Anesthesia Type: General Level of consciousness: awake and alert Pain management: pain level controlled Vital Signs Assessment: post-procedure vital signs reviewed and stable Respiratory status: spontaneous breathing, nonlabored ventilation, respiratory function stable and patient connected to nasal cannula oxygen Cardiovascular status: blood pressure returned to baseline and stable Postop Assessment: no apparent nausea or vomiting Anesthetic complications: no    Last Vitals:  Vitals:   11/05/17 1400 11/05/17 1417  BP: (!) 140/53 (!) 145/67  Pulse: 70   Resp: 19 (!) 21  Temp:  36.9 C  SpO2: 95% 100%    Last Pain:  Vitals:   11/05/17 1950  TempSrc:   PainSc: 7                  Stephannie Broner COKER

## 2017-11-05 NOTE — Op Note (Signed)
OrthopaedicSurgeryOperativeNote (ZDG:[id number]) Date of Surgery: 11/01/2017 - 11/05/2017  Admit Date: 11/01/2017   Diagnoses: Pre-Op Diagnoses: Right Schatzker II lateral tibial plateau fracture  Post-Op Diagnosis: Same  Procedures: CPT 95188-CZYS reduction internal fixation of right lateral tibial plateau fracture  Surgeons: Primary: Shona Needles, MD   Assistant: Ainsley Spinner, PA-C   Location:[facility] OR ROOM 03   AnesthesiaGeneral   Antibiotics:Ancef 3 gm preop   Tourniquettime:76 min at 377mmHg  [id number]:20 mL   Complications:None  Specimens:None  Implants: Implant Name Type Inv. Item Serial No. Manufacturer Lot No. LRB No. Used Action  BONE CANC CHIPS 20CC - [id number] Bone Implant BONE [facility] CHIPS 20CC [id number] [employer] [place] [employer]  Right 1 Implanted  PLATE TIBIA VA-LCP 6H RT - [id number] Plate PLATE TIBIA VA-LCP 6H RT  [employer]  Right 1 Implanted  3.5 mm x 85 mm  cortex screw Screw   [employer]  Right 1 Implanted  3.70mm  x 95 mm cortex screw Screw   [employer]  Right 1 Implanted  3.5 mm x 40 mm cortex screw Screw   [employer]  Right 1 Implanted  3.5 mm x 46 mm cortex screw Screw   [employer]  Right 1 Implanted  3.5 mm x 90 mm cortex screw Screw   [employer]  Right 1 Implanted  3.5 mm x 58 mm cortex screw Screw   [employer]  Right 1 Implanted  SCREW LOCKING VA 3.5X85MM - [id number] Screw SCREW LOCKING VA 3.5X85MM  [employer]  Right 1 Implanted  SCREW LOCKING VA 3.5X75MM - [id number] Screw SCREW LOCKING VA 3.5X75MM  [employer]  Right 1 Implanted  SCREW LOCKING VA 3.5X95MM - [id number] Screw SCREW LOCKING VA 3.5X95MM  [employer]  Right 1 Implanted    IndicationsforSurgery: 72 year old male with a right displaced lateral tibial plateau fracture with multiple medical problems including obesity, COPD, coronary artery disease, obstructive sleep apnea, hypertension and hyperlipidemia. His knee was  unstable to exam.  I feel that due to his ambulatory nature and activity level that ORIF is most appropriate course of action to give him the best outcome.  Unfortunately he has multiple medical problems.  The cardiology team and pulmonology weighed and felt that there was no further optimization to be performed.  I did discuss risks and benefits with the patient. Risks discussed included bleeding requiring blood transfusion, bleeding causing a hematoma, infection, malunion, nonunion, damage to surrounding nerves and blood vessels, pain, hardware prominence or irritation, hardware failure, stiffness, post-traumatic arthritis, DVT/PE, compartment syndrome, and even death. Patient agrees that likely surgery is best, consent was obtained.  Operative Findings: 1. Valgus unstable lateral tibial plateau fracture with significant lateral joint impaction without meniscus tear 2. Open reduction internal fixation with Synthes 3.65mm VA LCP proximal tibial locking plate, 6 holes  Procedure: The patient was identified in the preoperative holding area. Consent was confirmed with the patient and their family and all questions were answered. The operative extremity was marked after confirmation with the patient. he was then brought back to the operating room by our anesthesia colleagues. He was placed under general anesthesia and transferred over to a radiolucent flat-top table. A bump was placed under the operative hip. A upper thigh tourniquet was placed. The operative extremity was then prepped and draped in usual sterile fashion. A preoperative timeout was performed to verify the patient, the procedure, and the extremity. Preoperative antibiotics were dosed.  Fluoroscopy was used evaluate the injury and were saved. An esmarch was  used to exsanguinate the leg and it was inflated to 37mmHg. An anterolateral parapatellar incision was performed and carried through skin and subcutaneous tissue. The overlying fascia was  incised in line with the skin incision just lateral to the patellar tendon. It was extended distally into the anterior compartment fascia. Bovie electrocautery was then used to elevated the IT band and musculature off of the anterolateral cortex of the tibia. The release was taken back until the fibular head was palpable. The plane between the IT band and the lateral capsule was developed and the anterior fat pad was resected to expose the capsule.  A submensical arthrotomy was then performed with a 15 blade. Tag sutures were used to retract the capsule and here I was able to visualize the impacted lateral joint and was able to see the meniscus. No meniscus tear was identified.  There was a split in the anterolateral cortex that was entered into with a Cobb elevator. The clot was debrided and Cobb elevator was used to start elevating the articular surface of the lateral tibial plateau. This was done with assistance by fluoroscopy in both the lateral and AP. Once I had elevated the joint sufficiently both visualized and fluoroscopically, I held this provisionally with 1.6 mm K-wires driven through the medial cortex.  I used crushed cancellous allograft to back filled the void left. I packed the graft underneath the articular surface to provide some structural support. I used a large periarticular reduction clamp to reduce the lateral plateau split and restore the width of the tibial plateau. I provisionally held this in place with a number of K-wires. A 3.64mm nonlocking screw was used to compress the lateral plateau and allowed removal of the periarticular clamp.  A 3.19mm LCP proximal tibial locking plate was chosen and was aligned to the tibia in both the AP and lateral fluoroscopic views.  A non-locking buttress screw was placed in the metaphysis to bring the plate flush with bone. A 3.78mm nonlocking screw was placed through the proximal portion of the plate to bring the proximal portion of the plate down to  bone. Percutaneous nonlocking screws were placed through the plate in the shaft. Three locking screws were placed to raft the articular surface in the proximal portion of the plate.  Final fluoroscopic images were obtained. The tourniquet was dropped and hemostasis was obtained. The incision was thoroughly irrigated. The tag sutures for the capsule were brought through the plate and tied down . One gram of vancomycin powder and 1.2 grams of tobramycin powder was placed in the wound and the IT band and anterior tibialis fascia was closed with 0-vicryl suture. The skin was closed with 2-0 vicryl and 3-0 nylon. The percutaneous incisions were closed with 3-0 nylon suture. The incisions were dressed with bacitracin ointment, adaptic and 4x4s. The knee and leg were dressed with sterile cast padding and an ACE wrap and was placed back in a knee immobilizer. The patient was then awoken from anesthesia and taken to PACU in stable condition.   Post Op Plan/Instructions: Nonweightbearing right leg. Ancef for surgical prophylaxis. Lovenox for DVT prophylaxis. Transition to hinged knee brace and allow range of motion. He will mobilize with physical and occupational therapy.  I was present and performed the entire surgery.  Ainsley Spinner, PA-C did assist me throughout the case. An assistant was necessary given the difficulty in approach, maintenance of reduction and ability to instrument the fracture.   Katha Hamming, MD [facility]

## 2017-11-05 NOTE — Anesthesia Procedure Notes (Signed)
Procedure Name: Intubation Date/Time: 11/05/2017 9:54 AM Performed by: Myna Bright, CRNA Pre-anesthesia Checklist: Patient identified, Emergency Drugs available, Suction available and Patient being monitored Patient Re-evaluated:Patient Re-evaluated prior to induction Oxygen Delivery Method: Circle system utilized Preoxygenation: Pre-oxygenation with 100% oxygen Induction Type: IV induction Ventilation: Mask ventilation without difficulty Laryngoscope Size: Glidescope and 4 Grade View: Grade I Tube type: Oral Tube size: 7.5 mm Number of attempts: 1 Airway Equipment and Method: Video-laryngoscopy and Stylet Placement Confirmation: ETT inserted through vocal cords under direct vision,  positive ETCO2 and breath sounds checked- equal and bilateral Secured at: 22 cm Tube secured with: Tape Dental Injury: Teeth and Oropharynx as per pre-operative assessment  Comments: Easy mask airway with OAW in place. Glidescope used d/t need for intubating on ortho bed with traction bar at head of bed, creating less than optimal intubating conditions. Easy glidescope intubation.

## 2017-11-05 NOTE — Interval H&P Note (Signed)
History and Physical Interval Note:  11/05/2017 9:21 AM  [name]  has presented today for surgery, with the diagnosis of Right tibial plateau fracture  The various methods of treatment have been discussed with the patient and family. After consideration of risks, benefits and other options for treatment, the patient has consented to  Procedure(s): OPEN REDUCTION INTERNAL FIXATION (ORIF) TIBIAL PLATEAU (Right) as a surgical intervention .  The patient's history has been reviewed, patient examined, no change in status, stable for surgery.  I have reviewed the patient's chart and labs.  Questions were answered to the patient's satisfaction.     Lennette Bihari P Dardan Shelton

## 2017-11-05 NOTE — Transfer of Care (Signed)
Immediate Anesthesia Transfer of Care Note  Patient: [name]  Procedure(s) Performed: OPEN REDUCTION INTERNAL FIXATION (ORIF) TIBIAL PLATEAU (Right Knee)  Patient Location: PACU  Anesthesia Type:General  Level of Consciousness: sedated and responds to stimulation  Airway & Oxygen Therapy: Patient Spontanous Breathing and Patient connected to face mask oxygen  Post-op Assessment: Report given to RN, Post -op Vital signs reviewed and stable and Patient moving all extremities  Post vital signs: Reviewed and stable  Last Vitals:  Vitals Value Taken Time  BP 149/103 11/05/2017 12:18 PM  Temp    Pulse 34 11/05/2017 12:22 PM  Resp 20 11/05/2017 12:22 PM  SpO2 100 % 11/05/2017 12:22 PM  Vitals shown include unvalidated device data.  Last Pain:  Vitals:   11/05/17 0803  TempSrc:   PainSc: 0-No pain         Complications: No apparent anesthesia complications

## 2017-11-05 NOTE — Anesthesia Preprocedure Evaluation (Signed)
Anesthesia Evaluation  Patient identified by MRN, date of birth, ID band Patient awake    Reviewed: Allergy & Precautions, NPO status , Patient's Chart, lab work & pertinent test results  Airway Mallampati: III  TM Distance: >3 FB Neck ROM: Full    Dental  (+) Poor Dentition, Partial Lower, Partial Upper, Loose,    Pulmonary former smoker,    breath sounds clear to auscultation       Cardiovascular hypertension,  Rhythm:Regular Rate:Normal     Neuro/Psych    GI/Hepatic   Endo/Other    Renal/GU      Musculoskeletal   Abdominal   Peds  Hematology   Anesthesia Other Findings   Reproductive/Obstetrics                             Anesthesia Physical Anesthesia Plan  ASA: III  Anesthesia Plan: General   Post-op Pain Management:    Induction: Intravenous  PONV Risk Score and Plan: 1 and Ondansetron and Dexamethasone  Airway Management Planned: Oral ETT  Additional Equipment:   Intra-op Plan:   Post-operative Plan: Extubation in OR  Informed Consent: I have reviewed the patients History and Physical, chart, labs and discussed the procedure including the risks, benefits and alternatives for the proposed anesthesia with the patient or authorized representative who has indicated his/her understanding and acceptance.   Dental advisory given  Plan Discussed with: CRNA and Anesthesiologist  Anesthesia Plan Comments:         Anesthesia Quick Evaluation

## 2017-11-05 NOTE — Consult Note (Signed)
            [name] CM Primary Care Navigator  11/05/2017  [name] September 15, 1945 [id number]   Went to see patient at the bedside to identify possible discharge but staff reports that patient is off the unit for surgery. (Open Reduction Internal Fixation (ORIF) tibial plateau)- related to fall at home.   Will attempt to see patientat another time when available in the room.     Addendum (11/06/17):   Went back to see patient at the bedsideto identify possible discharge needs. Patientreports that his "legs gave way causing a fall and had fractured right leg" that resulted to this admission/ surgery.  PatientendorsesDr. Maury Lane with [name] at [name] the primary care provider.    [facility] [name] and [name] [facility] service to obtain medications withoutdifficulty.  Patientreports thathehas beenmanaginghis ownmedications at [name] use of "pillorganizer"filled every 2 weeks.  Patientverbalized thathe was driving prior to admission/ surgery buthis grandson [name]) or friend [name]) will be able to provide transportationto hisdoctors'appointmentsafter discharge.   Made aware of transportation benefits from [name].  Patient's grandson and friend can assist with his care needs at home when discharged. He mentionedthatson and other grandsons can also provide assistance if needed.   Anticipateddischargeplan is skilled nursing facility (SNF)per therapy recommendation.  Patientvoiced understanding to call primary care provider's office whenhegetsback home, for a post discharge follow-up visit within1- 2weeksor sooner if needs arise. Patient letter (with PCP's contact number) was provided asareminder.   Discussed with patientregarding [name] CM services available for health management and resourcesat home, however, patientdenies any current needs or concerns at this  point.  He had expressedunderstandingto seekreferral from primary care provider to [name] care management ifdeemed necessary and appropriatefor services in the nearfuture- oncehe will return back home.  [name] care management information was provided for future needs thathe may have.  Primary care provider's office is listed asprovidingtransition of care (TOC) follow-up.   For additional questions please contact:  [name] PRIMARY [facility] Navigator Cell: [phone]

## 2017-11-05 NOTE — Progress Notes (Signed)
RT set up patient CPAP HS at beside. Patient is able to place hisself on when he is ready.

## 2017-11-05 NOTE — Progress Notes (Signed)
Called to PACU to set up Bipap

## 2017-11-05 NOTE — Progress Notes (Signed)
PROGRESS NOTE    [name]  TSV:[id number] DOB: [birth year] DOA: 11/01/2017 PCP: Maury Dus, MD    Brief Narrative:72 year old male with history of obesity, chronic respiratory failure with hypoxia, obstructive sleep apnea on BiPAP, coronary artery disease with prior stents, hypertension, hyperlipidemia, chronic second-degree Mobitz 1 AV block, who sustained a mechanical fall at home on the day on admission presents with right tibial plateau fracture. Orthopedic surgery consulted. Pulmonology and cardiology consulted for preoperative clearance. Plans are for patient to undergo operative repair on Thursday 5/23.     Assessment & Plan:   Active Problems:   Closed fracture of lateral portion of right tibial plateau   Closed fracture of medial plateau of right tibia  Right displaced tibial plateau fracture S/P surgery today. Essential hypertension-hold cozar and hctz -Continue Norvasc,and IV PRN's -Blood pressure within acceptable parameters today  Severe obstructive sleep apnea -Pulmonology following, continue BiPAP at night  ? Asthma with wheezing -Continue duo nebs, he received Lasix x1 on 5/20 and prednisone x1 today due to scattered wheezing  Chronic respiratory failure with hypoxia -Oxygen as needed  Coronary artery disease status post stenting in the past -No chest pain, cardiology following -continue aspirin  Second-degree Mobitz type I AV block -Cardiology will further discuss with EP. Awaiting the input.  Chronic constipation-lactulose ordered for today -Continues to be a problem, increased his bowel regimen today  Chronic kidney disease stage III -trending up.hold cozaar,hctz - Anxiety -PRN Ativan  Prior gout -Not an issue  Morbid obesity -will need significant weight loss      DVT prophylaxis: heparin Code Status: full Family Communication:dw family in the room Disposition Plan:tbd  Consultants: ortho  Procedures: ORIF  right lateral tibial plateau fracture Antimicrobials None no new complaints had bowel movements yesterday. Subjective:   Objective: Vitals:   11/05/17 0807 11/05/17 1218 11/05/17 1233 11/05/17 1248  BP:  (!) 149/103 (!) 131/108 132/78  Pulse:  (!) 145 86   Resp:  (!) 23 14 (!) 21  Temp:  (!) 97.5 F (36.4 C) (!) 97.5 F (36.4 C)   TempSrc:      SpO2: 95% 98% 100% 100%  Weight:      Height:        Intake/Output Summary (Last 24 hours) at 11/05/2017 1301 Last data filed at 11/05/2017 1136 Gross per 24 hour  Intake 450 ml  Output 250 ml  Net 200 ml   Filed Weights   11/01/17 1438  Weight: (!) 141.5 kg (312 lb)    Examination:  General exam: Appears calm and comfortable  Respiratory system: Clear to auscultation. Respiratory effort normal. Cardiovascular system: S1 & S2 heard, RRR. No JVD, murmurs, rubs, gallops or clicks. No pedal edema. Gastrointestinal system: Abdomen is nondistended, soft and nontender. No organomegaly or masses felt. Normal bowel sounds heard. Central nervous system: Alert and oriented. No focal neurological deficits. Extremities:trace edema RLE. Skin: No rashes, lesions or ulcers Psychiatry: Judgement and insight appear normal. Mood & affect appropriate.     Data Reviewed: I have personally reviewed following labs and imaging studies  CBC: Recent Labs  Lab 11/01/17 1702 11/02/17 0313 11/04/17 0351 11/05/17 0510  WBC 13.3* 10.4 10.2 9.6  NEUTROABS 9.8*  --   --   --   HGB 16.5 14.8 14.9 14.6  HCT 47.9 44.4 45.3 44.8  MCV 89.7 89.9 89.5 90.1  PLT 218 201 193 923   Basic Metabolic Panel: Recent Labs  Lab 11/01/17 1702 11/02/17 0313 11/04/17 0351 11/05/17 0510  NA 142  --  136 136  K 4.1  --  3.9 4.0  CL 104  --  99* 99*  CO2 27  --  28 28  GLUCOSE 119*  --  119* 111*  BUN 21*  --  23* 28*  CREATININE 1.39* 1.19 1.45* 1.48*  CALCIUM 10.7*  --  10.0 10.1   GFR: Estimated Creatinine Clearance: 65.7 mL/min (A) (by C-G formula  based on SCr of 1.48 mg/dL (H)). Liver Function Tests: Recent Labs  Lab 11/01/17 1702 11/05/17 0510  AST 26 26  ALT 26 31  ALKPHOS 60 50  BILITOT 1.0 1.2  PROT 7.9 6.4*  ALBUMIN 4.0 3.1*   No results for input(s): LIPASE, AMYLASE in the last 168 hours. No results for input(s): AMMONIA in the last 168 hours. Coagulation Profile: Recent Labs  Lab 11/02/17 0313 11/05/17 0510  INR 1.08 1.05   Cardiac Enzymes: No results for input(s): CKTOTAL, CKMB, CKMBINDEX, TROPONINI in the last 168 hours. BNP (last 3 results) Recent Labs    03/24/17 1634  PROBNP 190.0*   HbA1C: No results for input(s): HGBA1C in the last 72 hours. CBG: No results for input(s): GLUCAP in the last 168 hours. Lipid Profile: No results for input(s): CHOL, HDL, LDLCALC, TRIG, CHOLHDL, LDLDIRECT in the last 72 hours. Thyroid Function Tests: No results for input(s): TSH, T4TOTAL, FREET4, T3FREE, THYROIDAB in the last 72 hours. Anemia Panel: No results for input(s): VITAMINB12, FOLATE, FERRITIN, TIBC, IRON, RETICCTPCT in the last 72 hours. Sepsis Labs: No results for input(s): PROCALCITON, LATICACIDVEN in the last 168 hours.  Recent Results (from the past 240 hour(s))  Surgical pcr screen     Status: None   Collection Time: 11/03/17 11:23 AM  Result Value Ref Range Status   MRSA, PCR NEGATIVE NEGATIVE Final   Staphylococcus aureus NEGATIVE NEGATIVE Final    Comment: (NOTE) The Xpert SA Assay (FDA approved for NASAL specimens in patients 53 years of age and older), is one component of a comprehensive surveillance program. It is not intended to diagnose infection nor to guide or monitor treatment. Performed at [facility], [place] [address], [place], [place] [address]          Radiology Studies: Dg Knee 1-2 Views Right  Result Date: 11/05/2017 CLINICAL DATA:  Right tibial plateau fracture ORIF. EXAM: DG C-ARM 61-120 MIN; RIGHT KNEE - 1-2 VIEW COMPARISON:  CT right knee dated Nov 01, 2017.  FINDINGS: Multiple intraoperative x-rays demonstrate interval lateral plate and screw fixation of the lateral tibial plateau fracture, now in anatomic alignment. No evidence of hardware complication. IMPRESSION: 1. Lateral tibial plateau fracture ORIF in anatomic alignment. FLUOROSCOPY TIME:  2 minutes, 10 seconds. C-arm fluoroscopic images were obtained intraoperatively and submitted for post operative interpretation. Electronically Signed   By: Titus Dubin M.D.   On: 11/05/2017 11:54   Dg Knee Right Port  Result Date: 11/05/2017 CLINICAL DATA:  Status post open reduction and internal fixation of right tibial fracture. EXAM: PORTABLE RIGHT KNEE - 1-2 VIEW COMPARISON:  Fluoroscopic images of same day. FINDINGS: Patient is status post surgical internal fixation of lateral tibial plateau fracture. Good alignment of fracture components is noted. Expected postoperative changes are noted in the surrounding soft tissues. IMPRESSION: Status post surgical internal fixation of lateral tibial plateau fracture. Electronically Signed   By: Marijo Conception, M.D.   On: 11/05/2017 12:43   Dg C-arm 1-60 Min  Result Date: 11/05/2017 CLINICAL DATA:  Right tibial plateau  fracture ORIF. EXAM: DG C-ARM 61-120 MIN; RIGHT KNEE - 1-2 VIEW COMPARISON:  CT right knee dated Nov 01, 2017. FINDINGS: Multiple intraoperative x-rays demonstrate interval lateral plate and screw fixation of the lateral tibial plateau fracture, now in anatomic alignment. No evidence of hardware complication. IMPRESSION: 1. Lateral tibial plateau fracture ORIF in anatomic alignment. FLUOROSCOPY TIME:  2 minutes, 10 seconds. C-arm fluoroscopic images were obtained intraoperatively and submitted for post operative interpretation. Electronically Signed   By: Titus Dubin M.D.   On: 11/05/2017 11:54        Scheduled Meds: . [MAR Hold] allopurinol  300 mg Oral Daily  . [MAR Hold] amLODipine  10 mg Oral Daily  . [MAR Hold] arformoterol  15 mcg  Nebulization BID  . [MAR Hold] aspirin EC  81 mg Oral Daily  . [MAR Hold] budesonide (PULMICORT) nebulizer solution  0.25 mg Nebulization BID  . fentaNYL      . fentaNYL      . [MAR Hold] heparin  5,000 Units Subcutaneous Q8H  . [MAR Hold] ipratropium-albuterol  3 mL Nebulization TID  . [MAR Hold] montelukast  10 mg Oral QHS  . [MAR Hold] pantoprazole  40 mg Oral Daily  . [MAR Hold] polyethylene glycol  17 g Oral BID  . [MAR Hold] predniSONE  30 mg Oral Once  . [MAR Hold] senna  2 tablet Oral Daily   Continuous Infusions: . lactated ringers 10 mL/hr at 11/05/17 0908     LOS: 4 days     Georgette Shell, MD [facility]  If 7PM-7AM, please contact night-coverage [email or web address] Password [email or web address] 11/05/2017, 1:02 PM

## 2017-11-06 ENCOUNTER — Encounter (HOSPITAL_COMMUNITY): Payer: Self-pay | Admitting: Student

## 2017-11-06 LAB — PTH, INTACT AND CALCIUM
CALCIUM TOTAL (PTH): 10.3 mg/dL — AB (ref 8.6–10.2)
PTH: 72 pg/mL — ABNORMAL HIGH (ref 15–65)

## 2017-11-06 LAB — CALCIUM, IONIZED: Calcium, Ionized, Serum: 5.5 mg/dL (ref 4.5–5.6)

## 2017-11-06 LAB — BASIC METABOLIC PANEL
Anion gap: 8 (ref 5–15)
BUN: 34 mg/dL — AB (ref 6–20)
CHLORIDE: 97 mmol/L — AB (ref 101–111)
CO2: 29 mmol/L (ref 22–32)
Calcium: 9.7 mg/dL (ref 8.9–10.3)
Creatinine, Ser: 1.48 mg/dL — ABNORMAL HIGH (ref 0.61–1.24)
GFR calc Af Amer: 52 mL/min — ABNORMAL LOW (ref >60)
GFR calc non Af Amer: 45 mL/min — ABNORMAL LOW (ref >60)
GLUCOSE: 117 mg/dL — AB (ref 65–99)
POTASSIUM: 4.5 mmol/L (ref 3.5–5.1)
SODIUM: 134 mmol/L — AB (ref 135–145)

## 2017-11-06 LAB — CBC
HCT: 45.8 % (ref 39.0–52.0)
HEMOGLOBIN: 14.7 g/dL (ref 13.0–17.0)
MCH: 29.1 pg (ref 26.0–34.0)
MCHC: 32.1 g/dL (ref 30.0–36.0)
MCV: 90.5 fL (ref 78.0–100.0)
Platelets: 217 10*3/uL (ref 150–400)
RBC: 5.06 MIL/uL (ref 4.22–5.81)
RDW: 13.5 % (ref 11.5–15.5)
WBC: 12.2 10*3/uL — ABNORMAL HIGH (ref 4.0–10.5)

## 2017-11-06 LAB — VITAMIN D 25 HYDROXY (VIT D DEFICIENCY, FRACTURES): VIT D 25 HYDROXY: 18.2 ng/mL — AB (ref 30.0–100.0)

## 2017-11-06 LAB — CALCITRIOL (1,25 DI-OH VIT D): Vit D, 1,25-Dihydroxy: 52.8 pg/mL (ref 19.9–79.3)

## 2017-11-06 MED ORDER — ENOXAPARIN SODIUM 40 MG/0.4ML ~~LOC~~ SOLN: 40.0000 mg | SUBCUTANEOUS | Status: DC

## 2017-11-06 NOTE — Progress Notes (Signed)
PROGRESS NOTE    PATRICIA PERALES  ZOX:[id number] DOB: [birth year] DOA: 11/01/2017 PCP: Maury Dus, MD    Brief Narrative:  72 year old male with history of obesity, chronic respiratory failure with hypoxia, obstructive sleep apnea on BiPAP, coronary artery disease with prior stents, hypertension, hyperlipidemia, chronic second-degree Mobitz 1 AV block, who sustained a mechanical fall at home on the day on admission presents with right tibial plateau fracture. Orthopedic surgery consulted. Pulmonology and cardiology consulted for preoperative clearance. Plans are for patient to undergo operative repair on Thursday 5/23.    Assessment & Plan:   Active Problems:   Closed fracture of lateral portion of right tibial plateau   Closed fracture of medial plateau of right tibia  Right displaced tibial plateau fracture S/P surgery 5/23 Essential hypertension-hold cozar and hctz -Continue Norvasc,and IV PRN's -Blood pressure within acceptable parameters today  Severe obstructive sleep apnea -Pulmonology following, continue BiPAP at night  ? Asthma with wheezing -Continue duo nebs, he received Lasix x1 on 5/20 and prednisone x1 today due to scattered wheezing  Chronic respiratory failure with hypoxia -Oxygen as needed  Coronary artery disease status post stenting in the past stable   Second-degree Mobitz type I AV block -Cardiology consulted.no new recommendations.  Chronic constipation-continue bowel regime.   Chronic kidney disease stage III -trending up.hold cozaar,hctz.bmp pending for today. - Anxiety -PRN Ativan  Prior gout -Not an issue  Morbid obesity -will need significant weight loss    DVT prophylaxis: lovenox Code Status:full Family Communication:none Disposition Plan: Pt eval tbd  Consultants: ortho Procedures: ORIF right lateral tibial plateau fracture   Antimicrobials:none  Subjective:no bm 2 days pain  better   Objective: Vitals:   11/06/17 0231 11/06/17 0413 11/06/17 0507 11/06/17 0914  BP: 128/60 (!) 126/55    Pulse: 62 62    Resp: 16 16    Temp: 98.5 F (36.9 C) 98.3 F (36.8 C)    TempSrc:  Oral    SpO2: 92% 97% 98% 97%  Weight:      Height:        Intake/Output Summary (Last 24 hours) at 11/06/2017 0922 Last data filed at 11/06/2017 0600 Gross per 24 hour  Intake 1956 ml  Output 1075 ml  Net 881 ml   Filed Weights   11/01/17 1438  Weight: (!) 141.5 kg (312 lb)    Examination:  General exam: Appears calm and comfortable  Respiratory system: Clear to auscultation. Respiratory effort normal. Cardiovascular system: S1 & S2 heard, RRR. No JVD, murmurs, rubs, gallops or clicks. No pedal edema. Gastrointestinal system: Abdomen is nondistended, soft and nontender. No organomegaly or masses felt. Normal bowel sounds heard. Central nervous system: Alert and oriented. No focal neurological deficits. Extremities: Symmetric 5 x 5 power. Skin: No rashes, lesions or ulcers Psychiatry: Judgement and insight appear normal. Mood & affect appropriate.     Data Reviewed: I have personally reviewed following labs and imaging studies  CBC: Recent Labs  Lab 11/01/17 1702 11/02/17 0313 11/04/17 0351 11/05/17 0510 11/06/17 0621  WBC 13.3* 10.4 10.2 9.6 12.2*  NEUTROABS 9.8*  --   --   --   --   HGB 16.5 14.8 14.9 14.6 14.7  HCT 47.9 44.4 45.3 44.8 45.8  MCV 89.7 89.9 89.5 90.1 90.5  PLT 218 201 193 202 811   Basic Metabolic Panel: Recent Labs  Lab 11/01/17 1702 11/02/17 0313 11/04/17 0351 11/05/17 0510  NA 142  --  136 136  K 4.1  --  3.9 4.0  CL 104  --  99* 99*  CO2 27  --  28 28  GLUCOSE 119*  --  119* 111*  BUN 21*  --  23* 28*  CREATININE 1.39* 1.19 1.45* 1.48*  CALCIUM 10.7*  --  10.0 10.1  10.3*   GFR: Estimated Creatinine Clearance: 65.7 mL/min (A) (by C-G formula based on SCr of 1.48 mg/dL (H)). Liver Function Tests: Recent Labs  Lab  11/01/17 1702 11/05/17 0510  AST 26 26  ALT 26 31  ALKPHOS 60 50  BILITOT 1.0 1.2  PROT 7.9 6.4*  ALBUMIN 4.0 3.1*   No results for input(s): LIPASE, AMYLASE in the last 168 hours. No results for input(s): AMMONIA in the last 168 hours. Coagulation Profile: Recent Labs  Lab 11/02/17 0313 11/05/17 0510  INR 1.08 1.05   Cardiac Enzymes: No results for input(s): CKTOTAL, CKMB, CKMBINDEX, TROPONINI in the last 168 hours. BNP (last 3 results) Recent Labs    03/24/17 1634  PROBNP 190.0*   HbA1C: No results for input(s): HGBA1C in the last 72 hours. CBG: No results for input(s): GLUCAP in the last 168 hours. Lipid Profile: No results for input(s): CHOL, HDL, LDLCALC, TRIG, CHOLHDL, LDLDIRECT in the last 72 hours. Thyroid Function Tests: No results for input(s): TSH, T4TOTAL, FREET4, T3FREE, THYROIDAB in the last 72 hours. Anemia Panel: No results for input(s): VITAMINB12, FOLATE, FERRITIN, TIBC, IRON, RETICCTPCT in the last 72 hours. Sepsis Labs: No results for input(s): PROCALCITON, LATICACIDVEN in the last 168 hours.  Recent Results (from the past 240 hour(s))  Surgical pcr screen     Status: None   Collection Time: 11/03/17 11:23 AM  Result Value Ref Range Status   MRSA, PCR NEGATIVE NEGATIVE Final   Staphylococcus aureus NEGATIVE NEGATIVE Final    Comment: (NOTE) The Xpert SA Assay (FDA approved for NASAL specimens in patients 56 years of age and older), is one component of a comprehensive surveillance program. It is not intended to diagnose infection nor to guide or monitor treatment. Performed at [facility], [place] [address] [place], [place] [address]          Radiology Studies: Dg Knee 1-2 Views Right  Result Date: 11/05/2017 CLINICAL DATA:  Right tibial plateau fracture ORIF. EXAM: DG C-ARM 61-120 MIN; RIGHT KNEE - 1-2 VIEW COMPARISON:  CT right knee dated Nov 01, 2017. FINDINGS: Multiple intraoperative x-rays demonstrate interval lateral plate  and screw fixation of the lateral tibial plateau fracture, now in anatomic alignment. No evidence of hardware complication. IMPRESSION: 1. Lateral tibial plateau fracture ORIF in anatomic alignment. FLUOROSCOPY TIME:  2 minutes, 10 seconds. C-arm fluoroscopic images were obtained intraoperatively and submitted for post operative interpretation. Electronically Signed   By: Titus Dubin M.D.   On: 11/05/2017 11:54   Dg Knee Right Port  Result Date: 11/05/2017 CLINICAL DATA:  Status post open reduction and internal fixation of right tibial fracture. EXAM: PORTABLE RIGHT KNEE - 1-2 VIEW COMPARISON:  Fluoroscopic images of same day. FINDINGS: Patient is status post surgical internal fixation of lateral tibial plateau fracture. Good alignment of fracture components is noted. Expected postoperative changes are noted in the surrounding soft tissues. IMPRESSION: Status post surgical internal fixation of lateral tibial plateau fracture. Electronically Signed   By: Marijo Conception, M.D.   On: 11/05/2017 12:43   Dg C-arm 1-60 Min  Result Date: 11/05/2017 CLINICAL DATA:  Right tibial plateau fracture ORIF. EXAM: DG C-ARM 61-120 MIN; RIGHT KNEE - 1-2  VIEW COMPARISON:  CT right knee dated Nov 01, 2017. FINDINGS: Multiple intraoperative x-rays demonstrate interval lateral plate and screw fixation of the lateral tibial plateau fracture, now in anatomic alignment. No evidence of hardware complication. IMPRESSION: 1. Lateral tibial plateau fracture ORIF in anatomic alignment. FLUOROSCOPY TIME:  2 minutes, 10 seconds. C-arm fluoroscopic images were obtained intraoperatively and submitted for post operative interpretation. Electronically Signed   By: Titus Dubin M.D.   On: 11/05/2017 11:54        Scheduled Meds: . allopurinol  300 mg Oral Daily  . amLODipine  10 mg Oral Daily  . arformoterol  15 mcg Nebulization BID  . aspirin EC  81 mg Oral Daily  . budesonide (PULMICORT) nebulizer solution  0.25 mg  Nebulization BID  . enoxaparin (LOVENOX) injection  40 mg Subcutaneous Q24H  . montelukast  10 mg Oral QHS  . pantoprazole  40 mg Oral Daily  . polyethylene glycol  17 g Oral BID  . predniSONE  30 mg Oral Once  . senna  2 tablet Oral Daily   Continuous Infusions: .  ceFAZolin (ANCEF) IV Stopped (11/06/17 1779)  . lactated ringers 10 mL/hr at 11/05/17 0908     LOS: 5 days    Georgette Shell, MD [facility]  If 7PM-7AM, please contact night-coverage [email or web address] Password [email or web address] 11/06/2017, 9:22 AM

## 2017-11-06 NOTE — Evaluation (Signed)
Physical Therapy Evaluation Patient Details Name: [name] MRN: [id number] DOB: Dec 07, 1945 Today's Date: 11/06/2017   History of Present Illness  Pt is a 72 year old man admitted after a fall on 11/01/17 resulting in R tibia fx. s/p ORIF 11/05/17. PMH: obesity, chronic respiratory failure, OSA on bipap, CAD with stents, HTN, asthma, CKD, anxiety, gout.  Clinical Impression  Pt presented supine in bed with HOB elevated, awake and willing to participate in therapy session. Prior to admission, pt reported that he ambulated with use of SPC and was independent with ADLs. Pt lives with his wife in a single level home with six steps to enter. Pt currently requires heavy physical assistance of two people for bed mobility and transfers. Pt would continue to benefit from skilled physical therapy services at this time while admitted and after d/c to address the below listed limitations in order to improve overall safety and independence with functional mobility.     Follow Up Recommendations SNF;Supervision/Assistance - 24 hour    Equipment Recommendations  None recommended by PT    Recommendations for Other Services       Precautions / Restrictions Precautions Precautions: Fall Restrictions Weight Bearing Restrictions: Yes RLE Weight Bearing: Non weight bearing      Mobility  Bed Mobility Overal bed mobility: Needs Assistance Bed Mobility: Supine to Sit     Supine to sit: Max assist;+2 for physical assistance     General bed mobility comments: increased time and effort, use of bed rails, assistance with R LE movement off of bed, assistance for trunk elevation  Transfers Overall transfer level: Needs assistance Equipment used: Rolling walker (2 wheeled) Transfers: Sit to/from [facility] Sit to Stand: Max assist;+2 physical assistance Stand pivot transfers: Mod assist;+2 physical assistance       General transfer comment: increased time and effort, bed in  elevated position, cueing for hand placement and to maintain NWB R LE, heavy physical assistance of two to power into standing and with RW management for pivotal movement  Ambulation/Gait                Stairs            Wheelchair Mobility    Modified Rankin (Stroke Patients Only)       Balance Overall balance assessment: Needs assistance Sitting-balance support: Feet supported Sitting balance-Leahy Scale: Fair     Standing balance support: During functional activity;Bilateral upper extremity supported Standing balance-Leahy Scale: Poor                               Pertinent Vitals/Pain Pain Assessment: Faces Faces Pain Scale: Hurts even more Pain Location: R LE Pain Descriptors / Indicators: Sore;Grimacing;Guarding Pain Intervention(s): Monitored during session;Repositioned    Home Living Family/patient expects to be discharged to:: Private residence Living Arrangements: Spouse/significant other Available Help at Discharge: Family;Available 24 hours/day Type of Home: Mobile home Home Access: Stairs to enter Entrance Stairs-Rails: Right;Left Entrance Stairs-Number of Steps: 6 Home Layout: One level Home Equipment: Shower seat;Cane - single point;Walker - 2 wheels(has bari walker available from son)      Prior Function Level of Independence: Independent with assistive device(s)         Comments: walks with a cane     Hand Dominance        Extremity/Trunk Assessment   Upper Extremity Assessment Upper Extremity Assessment: Defer to OT evaluation    Lower Extremity Assessment  Lower Extremity Assessment: RLE deficits/detail RLE Deficits / Details: pt with hinged bledsoe brace donned. pt presents with decreased strength and ROM secondary to post-op pain       Communication   Communication: HOH  Cognition Arousal/Alertness: Awake/alert Behavior During Therapy: WFL for tasks assessed/performed Overall Cognitive Status:  Difficult to assess                                        General Comments      Exercises     Assessment/Plan    PT Assessment Patient needs continued PT services  PT Problem List Decreased strength;Decreased range of motion;Decreased activity tolerance;Decreased balance;Decreased mobility;Decreased coordination;Decreased knowledge of use of DME;Decreased safety awareness;Decreased knowledge of precautions;Pain       PT Treatment Interventions DME instruction;Gait training;Stair training;Functional mobility training;Therapeutic activities;Therapeutic exercise;Balance training;Neuromuscular re-education;Patient/family education    PT Goals (Current goals can be found in the Care Plan section)  Acute Rehab PT Goals Patient Stated Goal: get stronger PT Goal Formulation: With patient Time For Goal Achievement: 11/20/17 Potential to Achieve Goals: Good    Frequency Min 3X/week   Barriers to discharge        Co-evaluation PT/OT/SLP Co-Evaluation/Treatment: Yes Reason for Co-Treatment: To address functional/ADL transfers;For patient/therapist safety PT goals addressed during session: Mobility/safety with mobility;Balance;Proper use of DME;Strengthening/ROM         AM-PAC PT "6 Clicks" Daily Activity  Outcome Measure Difficulty turning over in bed (including adjusting bedclothes, sheets and blankets)?: Unable Difficulty moving from lying on back to sitting on the side of the bed? : Unable Difficulty sitting down on and standing up from a chair with arms (e.g., wheelchair, bedside commode, etc,.)?: Unable Help needed moving to and from a bed to chair (including a wheelchair)?: A Lot Help needed walking in hospital room?: A Lot Help needed climbing 3-5 steps with a railing? : Total 6 Click Score: 8    End of Session Equipment Utilized During Treatment: Gait belt Activity Tolerance: Patient limited by fatigue;Patient limited by pain Patient left: in  chair;with call bell/phone within reach;with family/visitor present Nurse Communication: Mobility status PT Visit Diagnosis: Other abnormalities of gait and mobility (R26.89);Pain Pain - Right/Left: Right Pain - part of body: Leg    Time: 1130-1207 PT Time Calculation (min) (ACUTE ONLY): 37 min   Charges:   PT Evaluation $PT Eval Moderate Complexity: 1 Mod     PT G Codes:        [place], PT, DPT [place] 11/06/2017, 1:28 PM

## 2017-11-06 NOTE — Progress Notes (Signed)
Orthopaedic Trauma Progress Note  S: Doing okay, pain controlled. Up with therapy  O:  Vitals:   11/06/17 1020 11/06/17 1340  BP: 135/66 (!) 157/85  Pulse: 67 63  Resp: 16 16  Temp: 97.8 F (36.6 C) 98.2 F (36.8 C)  SpO2: (!) 76% 92%   RLE: in hinged knee brace, dressing clean dry and intact. Neuro intact. Warm and well perfused toes  Imaging: Stable postop imaging  Labs:  CBC    Component Value Date/Time   WBC 12.2 (H) 11/06/2017 0621   RBC 5.06 11/06/2017 0621   HGB 14.7 11/06/2017 0621   HCT 45.8 11/06/2017 0621   PLT 217 11/06/2017 0621   MCV 90.5 11/06/2017 0621   MCH 29.1 11/06/2017 0621   MCHC 32.1 11/06/2017 0621   RDW 13.5 11/06/2017 0621   LYMPHSABS 2.5 11/01/2017 1702   MONOABS 0.8 11/01/2017 1702   EOSABS 0.2 11/01/2017 1702   BASOSABS 0.0 11/01/2017 17041    A/P: 72 year old male with multiple medical problems s/p ORIF of right tibial plateau fracture  Weightbearing: NWB RLE Insicional and dressing care: Plan to change dressing tomorrow Orthopedic device(s): Hinged knee brace locked in extension at night. Okay to unlock during the day to work on range of motion Showering: Not yet VTE prophylaxis: Lovenox 40mg  qd 30 days Pain control: Percocet q 4hrs Follow - up plan: 2 weeks  Shona Needles, MD [facility] [phone] (phone)

## 2017-11-06 NOTE — Evaluation (Signed)
Occupational Therapy Evaluation Patient Details Name: [name] MRN: [id number] DOB: 03-14-1946 Today's Date: 11/06/2017    History of Present Illness Pt is a 72 year old man admitted after a fall on 11/01/17 resulting in R tibia fx. s/p ORIF 11/05/17. PMH: obesity, chronic respiratory failure, OSA on bipap, CAD with stents, HTN, asthma, CKD, anxiety, gout.   Clinical Impression   Pt walks with a cane and is otherwise independent at baseline. He currently requires 2 person assist for mobility and set up to total assist for ADL.Pt will need post acute therapy in a SNF prior to return home. Will follow acutely.    Follow Up Recommendations  SNF;Supervision/Assistance - 24 hour    Equipment Recommendations       Recommendations for Other Services       Precautions / Restrictions Precautions Precautions: Fall Required Braces or Orthoses: Other Brace/Splint Other Brace/Splint: hinged knee brace in unlocked position Restrictions Weight Bearing Restrictions: Yes RLE Weight Bearing: Non weight bearing      Mobility Bed Mobility Overal bed mobility: Needs Assistance Bed Mobility: Supine to Sit     Supine to sit: Max assist;+2 for physical assistance     General bed mobility comments: increased time and effort, use of bed rails, assistance with R LE movement off of bed, assistance for trunk elevation  Transfers Overall transfer level: Needs assistance Equipment used: Rolling walker (2 wheeled) Transfers: Sit to/from [facility] Sit to Stand: Max assist;+2 physical assistance Stand pivot transfers: Mod assist;+2 physical assistance       General transfer comment: increased time and effort, bed in elevated position, cueing for hand placement and to maintain NWB R LE, heavy physical assistance of two to power into standing and with RW management for pivotal movement    Balance Overall balance assessment: Needs assistance Sitting-balance support: Feet  supported Sitting balance-Leahy Scale: Fair     Standing balance support: During functional activity;Bilateral upper extremity supported Standing balance-Leahy Scale: Poor                             ADL either performed or assessed with clinical judgement   ADL Overall ADL's : Needs assistance/impaired Eating/Feeding: Independent;Bed level   Grooming: Set up;Sitting   Upper Body Bathing: Minimal assistance;Sitting   Lower Body Bathing: Total assistance;+2 for physical assistance;Sit to/from stand   Upper Body Dressing : Sitting;Set up   Lower Body Dressing: Total assistance;+2 for physical assistance;Sit to/from stand   Toilet Transfer: +2 for physical assistance;Moderate assistance;Stand-pivot;BSC   Toileting- Clothing Manipulation and Hygiene: +2 for physical assistance;Total assistance;Sit to/from stand               Vision Baseline Vision/History: Wears glasses Wears Glasses: Reading only Patient Visual Report: No change from baseline       Perception     Praxis      Pertinent Vitals/Pain Pain Assessment: Faces Faces Pain Scale: Hurts even more Pain Location: R LE Pain Descriptors / Indicators: Sore;Grimacing;Guarding Pain Intervention(s): Monitored during session;Repositioned;Ice applied     Hand Dominance Right   Extremity/Trunk Assessment Upper Extremity Assessment Upper Extremity Assessment: Overall WFL for tasks assessed   Lower Extremity Assessment Lower Extremity Assessment: Defer to PT evaluation RLE Deficits / Details: pt with hinged bledsoe brace donned. pt presents with decreased strength and ROM secondary to post-op pain       Communication Communication Communication: HOH   Cognition Arousal/Alertness: Awake/alert Behavior During Therapy:  WFL for tasks assessed/performed Overall Cognitive Status: Difficult to assess                                     General Comments       Exercises     Shoulder  Instructions      Home Living Family/patient expects to be discharged to:: Private residence Living Arrangements: Spouse/significant other Available Help at Discharge: Family;Available 24 hours/day Type of Home: Mobile home Home Access: Stairs to enter Entrance Stairs-Number of Steps: 6 Entrance Stairs-Rails: Right;Left Home Layout: One level     Bathroom Shower/Tub: Occupational psychologist: Handicapped height     Home Equipment: Shower seat;Cane - single point;Walker - 2 wheels(has bari walker available from son)          Prior Functioning/Environment Level of Independence: Independent with assistive device(s)        Comments: walks with a cane        OT Problem List: Decreased strength;Decreased activity tolerance;Impaired balance (sitting and/or standing);Decreased knowledge of use of DME or AE;Pain;Obesity      OT Treatment/Interventions: Self-care/ADL training;DME and/or AE instruction;Therapeutic activities;Patient/family education;Balance training    OT Goals(Current goals can be found in the care plan section) Acute Rehab OT Goals Patient Stated Goal: get stronger OT Goal Formulation: With patient Time For Goal Achievement: 11/20/17 Potential to Achieve Goals: Good ADL Goals Pt Will Transfer to Toilet: with mod assist;bedside commode;stand pivot transfer Pt Will Perform Toileting - Clothing Manipulation and hygiene: with mod assist;sit to/from stand Additional ADL Goal #1: Pt will perform bed mobility with min assist in preparation for ADL.  OT Frequency: Min 2X/week   Barriers to D/C:            Co-evaluation PT/OT/SLP Co-Evaluation/Treatment: Yes Reason for Co-Treatment: For patient/therapist safety PT goals addressed during session: Mobility/safety with mobility;Balance;Proper use of DME;Strengthening/ROM OT goals addressed during session: Proper use of Adaptive equipment and DME;ADL's and self-care      AM-PAC PT "6 Clicks" Daily  Activity     Outcome Measure Help from another person eating meals?: None Help from another person taking care of personal grooming?: A Little Help from another person toileting, which includes using toliet, bedpan, or urinal?: Total Help from another person bathing (including washing, rinsing, drying)?: A Lot Help from another person to put on and taking off regular upper body clothing?: None Help from another person to put on and taking off regular lower body clothing?: Total 6 Click Score: 15   End of Session Equipment Utilized During Treatment: Gait belt;Rolling walker(knee brace) Nurse Communication: Mobility status  Activity Tolerance: Patient tolerated treatment well Patient left: in chair;with call bell/phone within reach;with family/visitor present  OT Visit Diagnosis: Unsteadiness on feet (R26.81);Pain;Muscle weakness (generalized) (M62.81)                Time: 4132-4401 OT Time Calculation (min): 36 min Charges:  OT General Charges $OT Visit: 1 Visit OT Evaluation $OT Eval Moderate Complexity: 1 Mod G-Codes:     07-Nov-2017 Nestor Lewandowsky, OTR/L Pager: [phone]  Werner Lean, Haze Boyden 11/07/2017, 1:41 PM

## 2017-11-06 NOTE — NC FL2 (Signed)
[place] MEDICAID FL2 LEVEL OF CARE SCREENING TOOL     IDENTIFICATION  Patient Name: [name] Birthdate: Nov 25, 1945 Sex: male Admission Date (Current Location): 11/01/2017  [facility] and [place] Number:  [employer] and Address:  [facility] [place], [place] [address], [place], [place] [address]      Provider Number: [id number]  Attending Physician Name and Address:  Georgette Shell, MD  Relative Name and Phone Number:  Obaloluwa Delatte [phone]    Current Level of Care: Hospital Recommended Level of Care: [place] Prior Approval Number:    Date Approved/Denied:   PASRR Number: [id number]  Discharge Plan: SNF    Current Diagnoses: Patient Active Problem List   Diagnosis Date Noted  . Closed fracture of medial plateau of right tibia   . Closed fracture of lateral portion of right tibial plateau 11/01/2017  . COPD (chronic obstructive pulmonary disease) ([place]) 05/06/2017  . History of peptic ulcer disease   . Lumbar disc disease   . Second degree Mobitz I AV block   . Hyperlipidemia 09/21/2014  . Pulmonary nodule   . OSA (obstructive sleep apnea) 04/12/2009  . Obesity (BMI 30-39.9)   . CAD (coronary artery disease) 04/11/2009  . Hypertensive heart disease without CHF   . GERD     Orientation RESPIRATION BLADDER Height & Weight     Self, Time, Situation, Place  Normal Continent Weight: (!) 312 lb (141.5 kg) Height:  6\' 1"  (185.4 cm)  BEHAVIORAL SYMPTOMS/MOOD NEUROLOGICAL BOWEL NUTRITION STATUS      Continent Diet(see discharge summary)  AMBULATORY STATUS COMMUNICATION OF NEEDS Skin   Extensive Assist Verbally Surgical wounds(incision with gauze and compression wrap)                       Personal Care Assistance Level of Assistance  Bathing, Feeding, Dressing Bathing Assistance: Maximum assistance Feeding assistance: Independent Dressing Assistance: Maximum assistance     Functional Limitations Info   Hearing, Sight, Speech Sight Info: Impaired(L eye impaired) Hearing Info: Adequate Speech Info: Adequate    SPECIAL CARE FACTORS FREQUENCY  PT (By licensed PT), OT (By licensed OT)     PT Frequency: 5x week OT Frequency: 5x week            Contractures Contractures Info: Not present    Additional Factors Info  Code Status, Allergies Code Status Info: Full Code Allergies Info: Prednisone           Current Medications (11/06/2017):  This is the current hospital active medication list Current Facility-Administered Medications  Medication Dose Route Frequency Provider Last Rate Last Dose  . acetaminophen (TYLENOL) tablet 650 mg  650 mg Oral Q6H PRN Haddix, Thomasene Lot, MD      . albuterol (PROVENTIL) (2.5 MG/3ML) 0.083% nebulizer solution 2.5 mg  2.5 mg Nebulization Q2H PRN Haddix, Thomasene Lot, MD      . allopurinol (ZYLOPRIM) tablet 300 mg  300 mg Oral Daily Haddix, Thomasene Lot, MD   300 mg at 11/06/17 1022  . amLODipine (NORVASC) tablet 10 mg  10 mg Oral Daily Haddix, Thomasene Lot, MD   10 mg at 11/06/17 1023  . arformoterol (BROVANA) nebulizer solution 15 mcg  15 mcg Nebulization BID Haddix, Thomasene Lot, MD   15 mcg at 11/06/17 0914  . aspirin EC tablet 81 mg  81 mg Oral Daily Haddix, Thomasene Lot, MD   81 mg at 11/06/17 1023  . bisacodyl (DULCOLAX) suppository  10 mg  10 mg Rectal Daily PRN Haddix, Thomasene Lot, MD      . budesonide (PULMICORT) nebulizer solution 0.25 mg  0.25 mg Nebulization BID Haddix, Thomasene Lot, MD   0.25 mg at 11/06/17 0914  . enoxaparin (LOVENOX) injection 40 mg  40 mg Subcutaneous Q24H Haddix, Thomasene Lot, MD   40 mg at 11/06/17 1021  . guaiFENesin-dextromethorphan (ROBITUSSIN DM) 100-10 MG/5ML syrup 5 mL  5 mL Oral Q4H PRN Haddix, Thomasene Lot, MD   5 mL at 11/04/17 1239  . lactated ringers infusion   Intravenous Continuous Haddix, Thomasene Lot, MD 10 mL/hr at 11/05/17 0908    . LORazepam (ATIVAN) tablet 0.5 mg  0.5 mg Oral Q12H PRN Haddix, Thomasene Lot, MD   0.5 mg at 11/06/17 0502  . montelukast  (SINGULAIR) tablet 10 mg  10 mg Oral QHS Haddix, Thomasene Lot, MD   10 mg at 11/05/17 2147  . morphine 2 MG/ML injection 0.5 mg  0.5 mg Intravenous Q2H PRN Haddix, Thomasene Lot, MD   0.5 mg at 11/06/17 0505  . oxyCODONE-acetaminophen (PERCOCET/ROXICET) 5-325 MG per tablet 1 tablet  1 tablet Oral Q4H PRN Haddix, Thomasene Lot, MD   1 tablet at 11/06/17 1411  . pantoprazole (PROTONIX) EC tablet 40 mg  40 mg Oral Daily Haddix, Thomasene Lot, MD   40 mg at 11/06/17 1022  . polyethylene glycol (MIRALAX / GLYCOLAX) packet 17 g  17 g Oral BID Haddix, Thomasene Lot, MD   17 g at 11/06/17 1022  . predniSONE (DELTASONE) tablet 30 mg  30 mg Oral Once Haddix, Thomasene Lot, MD      . promethazine (PHENERGAN) injection 12.5 mg  12.5 mg Intravenous Q6H PRN Georgette Shell, MD      . senna [facility]) tablet 17.2 mg  2 tablet Oral Daily Haddix, Thomasene Lot, MD   17.2 mg at 11/06/17 1022     Discharge Medications: Please see discharge summary for a list of discharge medications.  Relevant Imaging Results:  Relevant Lab Results:   Additional Information SS# [place], [place]

## 2017-11-07 LAB — BASIC METABOLIC PANEL
Anion gap: 8 (ref 5–15)
BUN: 34 mg/dL — AB (ref 6–20)
CHLORIDE: 100 mmol/L — AB (ref 101–111)
CO2: 29 mmol/L (ref 22–32)
CREATININE: 1.4 mg/dL — AB (ref 0.61–1.24)
Calcium: 9.7 mg/dL (ref 8.9–10.3)
GFR calc Af Amer: 56 mL/min — ABNORMAL LOW (ref >60)
GFR calc non Af Amer: 48 mL/min — ABNORMAL LOW (ref >60)
Glucose, Bld: 110 mg/dL — ABNORMAL HIGH (ref 65–99)
Potassium: 4.5 mmol/L (ref 3.5–5.1)
SODIUM: 137 mmol/L (ref 135–145)

## 2017-11-07 LAB — CBC
HCT: 43.6 % (ref 39.0–52.0)
Hemoglobin: 14.2 g/dL (ref 13.0–17.0)
MCH: 29.5 pg (ref 26.0–34.0)
MCHC: 32.6 g/dL (ref 30.0–36.0)
MCV: 90.5 fL (ref 78.0–100.0)
PLATELETS: 213 10*3/uL (ref 150–400)
RBC: 4.82 MIL/uL (ref 4.22–5.81)
RDW: 13.4 % (ref 11.5–15.5)
WBC: 11.2 10*3/uL — ABNORMAL HIGH (ref 4.0–10.5)

## 2017-11-07 MED ORDER — VITAMIN D 1000 UNITS PO TABS
2000.0000 [IU] | ORAL_TABLET | Freq: Two times a day (BID) | ORAL | Status: DC
  Administered 2017-11-07 – 2017-11-11 (×9): 2000 [IU] via ORAL
  Filled 2017-11-07 (×10): qty 2

## 2017-11-07 MED ORDER — VITAMIN C 500 MG PO TABS
500.0000 mg | ORAL_TABLET | Freq: Every day | ORAL | Status: DC
  Administered 2017-11-07 – 2017-11-11 (×5): 500 mg via ORAL
  Filled 2017-11-07 (×5): qty 1

## 2017-11-07 NOTE — Progress Notes (Signed)
Pt is on NIV QHS at this time tolerating it well.  

## 2017-11-07 NOTE — Progress Notes (Signed)
Orthopedic Trauma Service Progress Note   Patient ID: GAVAN NORDBY MRN: [id number] DOB/AGE: 1945/09/26 72 y.o.  Subjective:  Doing well Pain tolerable No other complaints    ROS As above  Objective:   VITALS:   Vitals:   11/06/17 2300 11/06/17 2336 11/07/17 0551 11/07/17 0939  BP: (!) 151/70  (!) 151/67   Pulse: 64 64 (!) 57   Resp: 16 16 16    Temp: 98.8 F (37.1 C)  98.8 F (37.1 C)   TempSrc: Oral  Oral   SpO2: 95% 97% 95% 100%  Weight:      Height:        Estimated body mass index is 41.16 kg/m as calculated from the following:   Height as of this encounter: 6\' 1"  (1.854 m).   Weight as of this encounter: 141.5 kg (312 lb).   Intake/Output      05/24 0701 - 05/25 0700 05/25 0701 - 05/26 0700   P.O. 1042    I.V. (mL/kg) 248.2 (1.8)    IV Piggyback     Total Intake(mL/kg) 1290.2 (9.1)    Urine (mL/kg/hr) 1100 (0.3)    Blood     Total Output 1100    Net +190.2           LABS  Results for orders placed or performed during the hospital encounter of 11/01/17 (from the past 24 hour(s))  Basic metabolic panel     Status: Abnormal   Collection Time: 11/07/17  5:15 AM  Result Value Ref Range   Sodium 137 135 - 145 mmol/L   Potassium 4.5 3.5 - 5.1 mmol/L   Chloride 100 (L) 101 - 111 mmol/L   CO2 29 22 - 32 mmol/L   Glucose, Bld 110 (H) 65 - 99 mg/dL   BUN 34 (H) 6 - 20 mg/dL   Creatinine, Ser 1.40 (H) 0.61 - 1.24 mg/dL   Calcium 9.7 8.9 - 10.3 mg/dL   GFR calc non Af Amer 48 (L) >60 mL/min   GFR calc Af Amer 56 (L) >60 mL/min   Anion gap 8 5 - 15  CBC     Status: Abnormal   Collection Time: 11/07/17  5:15 AM  Result Value Ref Range   WBC 11.2 (H) 4.0 - 10.5 K/uL   RBC 4.82 4.22 - 5.81 MIL/uL   Hemoglobin 14.2 13.0 - 17.0 g/dL   HCT 43.6 39.0 - 52.0 %   MCV 90.5 78.0 - 100.0 fL   MCH 29.5 26.0 - 34.0 pg   MCHC 32.6 30.0 - 36.0 g/dL   RDW 13.4 11.5 - 15.5 %   Platelets 213 150 - 400 K/uL     PHYSICAL EXAM:    Gen:  NAD, in bed, appears comfortable Ext:      Right Lower Extremity   Dressing c/d/i  Hinged knee brace fitting well  Ext warm   Motor and sensory functions intact and at baseline   No DCT   Compartments are soft   No pain with passive stretch  + DP pulse   Assessment/Plan: 2 Days Post-Op   Active Problems:   Closed fracture of lateral portion of right tibial plateau   Closed fracture of medial plateau of right tibia   Anti-infectives (From admission, onward)   Start     Dose/Rate Route Frequency Ordered Stop   11/05/17 1800  ceFAZolin (ANCEF) IVPB 2g/100 mL premix     2 g 200 mL/hr over 30 Minutes Intravenous Every 8 hours 11/05/17  1431 11/06/17 1049   11/05/17 1035  vancomycin (VANCOCIN) powder  Status:  Discontinued       As needed 11/05/17 1035 11/05/17 1212   11/05/17 1034  tobramycin (NEBCIN) powder  Status:  Discontinued       As needed 11/05/17 1034 11/05/17 1212   11/05/17 0800  ceFAZolin (ANCEF) 3 g in dextrose 5 % 50 mL IVPB     3 g 100 mL/hr over 30 Minutes Intravenous On call to O.R. 11/04/17 1016 11/05/17 1000    .  POD/HD#: 2  73 y/o male s/p fall with R lateral tibial plateau fracture    -fall   - R lateral tibial plateau fracture, split depression, shatzker 2  NWB X 6-8 weeks  Unrestricted ROM R knee  PT/OT   PT- please teach HEP for R knee ROM- AROM, PROM. Prone exercises as well. No ROM restrictions.  Quad sets, SLR, LAQ, SAQ, heel slides, stretching, prone flexion and extension  Ankle theraband program, heel cord stretching, toe towel curls, etc  No pillows under bend of knee when at rest, ok to place under heel to help work on extension. Can also use zero knee bone foam if available    Ice and elevate  Dressing change tomorrow   - Pain management:             Continue with current regimen    - ABL anemia/Hemodynamics             Stable   - Medical issues              Per primary    - DVT/PE prophylaxis:             recommend lovenox x  30 days    - ID:              periop abx   - Metabolic Bone Disease:             + vitamin d deficiency    Supplement    - Activity:             NWB R leg   - FEN/GI prophylaxis/Foley/Lines:             diet as tolerated      - Dispo:             continue with therapy   Will likely need snf    Jari Pigg, PA-C [facility] [phone] (P) [phone] Levi Aland (C) 11/07/2017, 1:05 PM

## 2017-11-07 NOTE — Progress Notes (Signed)
PROGRESS NOTE    [name]  YIF:[id number] DOB: 03-17-1946 DOA: 11/01/2017 PCP: Maury Dus, MD   Brief Narrative:72 year old male with history of obesity, chronic respiratory failure with hypoxia, obstructive sleep apnea on BiPAP, coronary artery disease with prior stents, hypertension, hyperlipidemia, chronic second-degree Mobitz 1 AV block, who sustained a mechanical fall at home on the day on admission presents with right tibial plateau fracture. Orthopedic surgery consulted. Pulmonology and cardiology consulted for preoperative clearance. Plans are for patient to undergo operative repair on Thursday 5/23.    Assessment & Plan:   Active Problems:   Closed fracture of lateral portion of right tibial plateau   Closed fracture of medial plateau of right tibia  Right displaced tibial plateau fractureS/P surgery 5/23 Essential hypertension-hold cozar and hctz due to elevated creatinine. -Continue Norvasc,and IV PRN's  Severe obstructive sleep apnea -Pulmonology following, continue BiPAP at night  ? Asthma with wheezing -Continue duo nebs, he received Lasix x1 on 5/20 and prednisone x1 today due to scattered wheezing  Chronic respiratory failure with hypoxia -Oxygen as needed  Coronary artery disease status post stenting in the past stable   Second-degree Mobitz type I AV block -Cardiology consulted.no new recommendations.  Chronic constipation-continue bowel regime.   Chronic kidney disease stage III -trending up.hold cozaar,hctz.bmp pending for today. - Anxiety -PRN Ativan     DVT prophylaxis Lovenox :Code Status full code  Disposition Plan: Plan discharge to skilled nursing facility hopefully early next week.  Consultants: Ortho  Procedures: ORIF right lateral tibial plateau fracture Antimicrobials: None  Subjective: Resting in no acute distress denies any new complaints  Objective: Vitals:   11/06/17 2007 11/06/17 2300 11/06/17  2336 11/07/17 0551  BP:  (!) 151/70  (!) 151/67  Pulse:  64 64 (!) 57  Resp:  16 16 16   Temp:  98.8 F (37.1 C)  98.8 F (37.1 C)  TempSrc:  Oral  Oral  SpO2: 92% 95% 97% 95%  Weight:      Height:        Intake/Output Summary (Last 24 hours) at 11/07/2017 0855 Last data filed at 11/07/2017 0649 Gross per 24 hour  Intake 1290.17 ml  Output 1100 ml  Net 190.17 ml   Filed Weights   11/01/17 1438  Weight: (!) 141.5 kg (312 lb)    Examination:  General exam: Appears calm and comfortable  Respiratory system: Clear to auscultation. Respiratory effort normal. Cardiovascular system: S1 & S2 heard, RRR. No JVD, murmurs, rubs, gallops or clicks. No pedal edema. Gastrointestinal system: Abdomen is nondistended, soft and nontender. No organomegaly or masses felt. Normal bowel sounds heard. Central nervous system: Alert and oriented. No focal neurological deficits. Extremities: r knee in brace Skin: No rashes, lesions or ulcers Psychiatry: Judgement and insight appear normal. Mood & affect appropriate.     Data Reviewed: I have personally reviewed following labs and imaging studies  CBC: Recent Labs  Lab 11/01/17 1702 11/02/17 0313 11/04/17 0351 11/05/17 0510 11/06/17 0621 11/07/17 0515  WBC 13.3* 10.4 10.2 9.6 12.2* 11.2*  NEUTROABS 9.8*  --   --   --   --   --   HGB 16.5 14.8 14.9 14.6 14.7 14.2  HCT 47.9 44.4 45.3 44.8 45.8 43.6  MCV 89.7 89.9 89.5 90.1 90.5 90.5  PLT 218 201 193 202 217 867   Basic Metabolic Panel: Recent Labs  Lab 11/01/17 1702 11/02/17 0313 11/04/17 0351 11/05/17 0510 11/06/17 0622 11/07/17 0515  NA 142  --  136  136 134* 137  K 4.1  --  3.9 4.0 4.5 4.5  CL 104  --  99* 99* 97* 100*  CO2 27  --  28 28 29 29   GLUCOSE 119*  --  119* 111* 117* 110*  BUN 21*  --  23* 28* 34* 34*  CREATININE 1.39* 1.19 1.45* 1.48* 1.48* 1.40*  CALCIUM 10.7*  --  10.0 10.1  10.3* 9.7 9.7   GFR: Estimated Creatinine Clearance: 69.5 mL/min (A) (by C-G formula  based on SCr of 1.4 mg/dL (H)). Liver Function Tests: Recent Labs  Lab 11/01/17 1702 11/05/17 0510  AST 26 26  ALT 26 31  ALKPHOS 60 50  BILITOT 1.0 1.2  PROT 7.9 6.4*  ALBUMIN 4.0 3.1*   No results for input(s): LIPASE, AMYLASE in the last 168 hours. No results for input(s): AMMONIA in the last 168 hours. Coagulation Profile: Recent Labs  Lab 11/02/17 0313 11/05/17 0510  INR 1.08 1.05   Cardiac Enzymes: No results for input(s): CKTOTAL, CKMB, CKMBINDEX, TROPONINI in the last 168 hours. BNP (last 3 results) Recent Labs    03/24/17 1634  PROBNP 190.0*   HbA1C: No results for input(s): HGBA1C in the last 72 hours. CBG: No results for input(s): GLUCAP in the last 168 hours. Lipid Profile: No results for input(s): CHOL, HDL, LDLCALC, TRIG, CHOLHDL, LDLDIRECT in the last 72 hours. Thyroid Function Tests: No results for input(s): TSH, T4TOTAL, FREET4, T3FREE, THYROIDAB in the last 72 hours. Anemia Panel: No results for input(s): VITAMINB12, FOLATE, FERRITIN, TIBC, IRON, RETICCTPCT in the last 72 hours. Sepsis Labs: No results for input(s): PROCALCITON, LATICACIDVEN in the last 168 hours.  Recent Results (from the past 240 hour(s))  Surgical pcr screen     Status: None   Collection Time: 11/03/17 11:23 AM  Result Value Ref Range Status   MRSA, PCR NEGATIVE NEGATIVE Final   Staphylococcus aureus NEGATIVE NEGATIVE Final    Comment: (NOTE) The Xpert SA Assay (FDA approved for NASAL specimens in patients 63 years of age and older), is one component of a comprehensive surveillance program. It is not intended to diagnose infection nor to guide or monitor treatment. Performed at [facility], [place] [address], [place],  [address]          Radiology Studies: Dg Knee 1-2 Views Right  Result Date: 11/05/2017 CLINICAL DATA:  Right tibial plateau fracture ORIF. EXAM: DG C-ARM 61-120 MIN; RIGHT KNEE - 1-2 VIEW COMPARISON:  CT right knee dated Nov 01, 2017.  FINDINGS: Multiple intraoperative x-rays demonstrate interval lateral plate and screw fixation of the lateral tibial plateau fracture, now in anatomic alignment. No evidence of hardware complication. IMPRESSION: 1. Lateral tibial plateau fracture ORIF in anatomic alignment. FLUOROSCOPY TIME:  2 minutes, 10 seconds. C-arm fluoroscopic images were obtained intraoperatively and submitted for post operative interpretation. Electronically Signed   By: Titus Dubin M.D.   On: 11/05/2017 11:54   Dg Knee Right Port  Result Date: 11/05/2017 CLINICAL DATA:  Status post open reduction and internal fixation of right tibial fracture. EXAM: PORTABLE RIGHT KNEE - 1-2 VIEW COMPARISON:  Fluoroscopic images of same day. FINDINGS: Patient is status post surgical internal fixation of lateral tibial plateau fracture. Good alignment of fracture components is noted. Expected postoperative changes are noted in the surrounding soft tissues. IMPRESSION: Status post surgical internal fixation of lateral tibial plateau fracture. Electronically Signed   By: Marijo Conception, M.D.   On: 11/05/2017 12:43   Dg C-arm  1-60 Min  Result Date: 11/05/2017 CLINICAL DATA:  Right tibial plateau fracture ORIF. EXAM: DG C-ARM 61-120 MIN; RIGHT KNEE - 1-2 VIEW COMPARISON:  CT right knee dated Nov 01, 2017. FINDINGS: Multiple intraoperative x-rays demonstrate interval lateral plate and screw fixation of the lateral tibial plateau fracture, now in anatomic alignment. No evidence of hardware complication. IMPRESSION: 1. Lateral tibial plateau fracture ORIF in anatomic alignment. FLUOROSCOPY TIME:  2 minutes, 10 seconds. C-arm fluoroscopic images were obtained intraoperatively and submitted for post operative interpretation. Electronically Signed   By: Titus Dubin M.D.   On: 11/05/2017 11:54        Scheduled Meds: . allopurinol  300 mg Oral Daily  . amLODipine  10 mg Oral Daily  . arformoterol  15 mcg Nebulization BID  . aspirin EC  81 mg  Oral Daily  . budesonide (PULMICORT) nebulizer solution  0.25 mg Nebulization BID  . enoxaparin (LOVENOX) injection  40 mg Subcutaneous Q24H  . montelukast  10 mg Oral QHS  . pantoprazole  40 mg Oral Daily  . polyethylene glycol  17 g Oral BID  . predniSONE  30 mg Oral Once  . senna  2 tablet Oral Daily   Continuous Infusions: . lactated ringers 10 mL/hr at 11/07/17 0407     LOS: 6 days     Georgette Shell, MD [facility]  If 7PM-7AM, please contact night-coverage [email or web address] Password [facility] 11/07/2017, 8:55 AM

## 2017-11-08 LAB — BASIC METABOLIC PANEL
Anion gap: 8 (ref 5–15)
BUN: 31 mg/dL — AB (ref 6–20)
CO2: 28 mmol/L (ref 22–32)
CREATININE: 1.39 mg/dL — AB (ref 0.61–1.24)
Calcium: 9.8 mg/dL (ref 8.9–10.3)
Chloride: 101 mmol/L (ref 101–111)
GFR calc Af Amer: 56 mL/min — ABNORMAL LOW (ref >60)
GFR calc non Af Amer: 49 mL/min — ABNORMAL LOW (ref >60)
GLUCOSE: 107 mg/dL — AB (ref 65–99)
Potassium: 4.3 mmol/L (ref 3.5–5.1)
SODIUM: 137 mmol/L (ref 135–145)

## 2017-11-08 LAB — CBC
HCT: 42.7 % (ref 39.0–52.0)
Hemoglobin: 13.6 g/dL (ref 13.0–17.0)
MCH: 29.1 pg (ref 26.0–34.0)
MCHC: 31.9 g/dL (ref 30.0–36.0)
MCV: 91.2 fL (ref 78.0–100.0)
PLATELETS: 220 10*3/uL (ref 150–400)
RBC: 4.68 MIL/uL (ref 4.22–5.81)
RDW: 13.4 % (ref 11.5–15.5)
WBC: 10.3 10*3/uL (ref 4.0–10.5)

## 2017-11-08 NOTE — Discharge Instructions (Signed)
Orthopaedic Trauma Service Discharge Instructions   General Discharge Instructions  WEIGHT BEARING STATUS: nonweightbearing right leg x 6-8 weeks   RANGE OF MOTION/ACTIVITY:              Unrestricted ROM R knee             PT/OT                         PT-  HEP for R knee ROM- AROM, PROM. Prone exercises as well. No ROM restrictions.  Quad sets, SLR, LAQ, SAQ, heel slides, stretching, prone flexion and extension                           Ankle theraband program, heel cord stretching, toe towel curls, etc   No pillows under bend of knee when at rest, ok to place under heel to help work on extension. Can also use zero knee bone foam if available               Ice and elevate   Hinged knee brace    Unlocked during the day and locked at night in full extension   Wound Care: daily wound care effective now. Can leave open to air if completely dry.   Discharge Wound Care Instructions  Do NOT apply any ointments, solutions or lotions to pin sites or surgical wounds.  These prevent needed drainage and even though solutions like hydrogen peroxide kill bacteria, they also damage cells lining the pin sites that help fight infection.  Applying lotions or ointments can keep the wounds moist and can cause them to breakdown and open up as well. This can increase the risk for infection. When in doubt call the office.  Surgical incisions should be dressed daily.  If any drainage is noted, use one layer of adaptic, then gauze, Kerlix, and an ace wrap.  Once the incision is completely dry and without drainage, it may be left open to air out.  Showering may begin 36-48 hours later.  Cleaning gently with soap and water.  Traumatic wounds should be dressed daily as well.    One layer of adaptic, gauze, Kerlix, then ace wrap.  The adaptic can be discontinued once the draining has ceased    If you have a wet to dry dressing: wet the gauze with saline the squeeze as much saline out so the gauze is moist  (not soaking wet), place moistened gauze over wound, then place a dry gauze over the moist one, followed by Kerlix wrap, then ace wrap.   DVT/PE prophylaxis: Lovenox 40 mg subcutaneous injection daily x 30 days   Diet: as you were eating previously.  Can use over the counter stool softeners and bowel preparations, such as Miralax, to help with bowel movements.  Narcotics can be constipating.  Be sure to drink plenty of fluids  PAIN MEDICATION USE AND EXPECTATIONS  You have likely been given narcotic medications to help control your pain.  After a traumatic event that results in an fracture (broken bone) with or without surgery, it is ok to use narcotic pain medications to help control one's pain.  We understand that everyone responds to pain differently and each individual patient will be evaluated on a regular basis for the continued need for narcotic medications. Ideally, narcotic medication use should last no more than 6-8 weeks (coinciding with fracture healing).   As a patient it is your  responsibility as well to monitor narcotic medication use and report the amount and frequency you use these medications when you come to your office visit.   We would also advise that if you are using narcotic medications, you should take a dose prior to therapy to maximize you participation.  IF YOU ARE ON NARCOTIC MEDICATIONS IT IS NOT PERMISSIBLE TO OPERATE A MOTOR VEHICLE (MOTORCYCLE/CAR/TRUCK/MOPED) OR HEAVY MACHINERY DO NOT MIX NARCOTICS WITH OTHER CNS (CENTRAL NERVOUS SYSTEM) DEPRESSANTS SUCH AS ALCOHOL   STOP SMOKING OR USING NICOTINE PRODUCTS!!!!  As discussed nicotine severely impairs your body's ability to heal surgical and traumatic wounds but also impairs bone healing.  Wounds and bone heal by forming microscopic blood vessels (angiogenesis) and nicotine is a vasoconstrictor (essentially, shrinks blood vessels).  Therefore, if vasoconstriction occurs to these microscopic blood vessels they  essentially disappear and are unable to deliver necessary nutrients to the healing tissue.  This is one modifiable factor that you can do to dramatically increase your chances of healing your injury.    (This means no smoking, no nicotine gum, patches, etc)  DO NOT USE NONSTEROIDAL ANTI-INFLAMMATORY DRUGS (NSAID'S)  Using products such as Advil (ibuprofen), Aleve (naproxen), Motrin (ibuprofen) for additional pain control during fracture healing can delay and/or prevent the healing response.  If you would like to take over the counter (OTC) medication, Tylenol (acetaminophen) is ok.  However, some narcotic medications that are given for pain control contain acetaminophen as well. Therefore, you should not exceed more than 4000 mg of tylenol in a day if you do not have liver disease.  Also note that there are may OTC medicines, such as cold medicines and allergy medicines that my contain tylenol as well.  If you have any questions about medications and/or interactions please ask your doctor/PA or your pharmacist.      ICE AND ELEVATE INJURED/OPERATIVE EXTREMITY  Using ice and elevating the injured extremity above your heart can help with swelling and pain control.  Icing in a pulsatile fashion, such as 20 minutes on and 20 minutes off, can be followed.    Do not place ice directly on skin. Make sure there is a barrier between to skin and the ice pack.    Using frozen items such as frozen peas works well as the conform nicely to the are that needs to be iced.  USE AN ACE WRAP OR TED HOSE FOR SWELLING CONTROL  In addition to icing and elevation, Ace wraps or TED hose are used to help limit and resolve swelling.  It is recommended to use Ace wraps or TED hose until you are informed to stop.    When using Ace Wraps start the wrapping distally (farthest away from the body) and wrap proximally (closer to the body)   Example: If you had surgery on your leg or thing and you do not have a splint on, start the  ace wrap at the toes and work your way up to the thigh        If you had surgery on your upper extremity and do not have a splint on, start the ace wrap at your fingers and work your way up to the upper arm  IF YOU ARE IN A SPLINT OR CAST DO NOT [place]   If your splint gets wet for any reason please contact the office immediately. You may shower in your splint or cast as long as you keep it dry.  This can be  done by wrapping in a cast cover or garbage back (or similar)  Do Not stick any thing down your splint or cast such as pencils, money, or hangers to try and scratch yourself with.  If you feel itchy take benadryl as prescribed on the bottle for itching  IF YOU ARE IN A CAM BOOT (BLACK BOOT)  You may remove boot periodically. Perform daily dressing changes as noted below.  Wash the liner of the boot regularly and wear a sock when wearing the boot. It is recommended that you sleep in the boot until told otherwise  CALL THE OFFICE WITH ANY QUESTIONS OR CONCERNS: [phone]

## 2017-11-08 NOTE — Progress Notes (Signed)
Pt is on NIV QHS at this time tolerating it well.  

## 2017-11-08 NOTE — Progress Notes (Signed)
PROGRESS NOTE    NAVY BELAY  ELF:[id number] DOB: 1945/06/29 DOA: 11/01/2017 PCP: Maury Dus, MD  Brief Narrative: 72 year old male with history of obesity, chronic respiratory failure with hypoxia, obstructive sleep apnea on BiPAP, coronary artery disease with prior stents, hypertension, hyperlipidemia, chronic second-degree Mobitz 1 AV block, who sustained a mechanical fall at home on the day on admission presents with right tibial plateau fracture. Orthopedic surgery consulted. Pulmonology and cardiology consulted for preoperative clearance. Plans are for patient to undergo operative repair on Thursday 5/23.    Assessment & Plan:   Active Problems:   Closed fracture of lateral portion of right tibial plateau   Closed fracture of medial plateau of right tibia  Right displaced tibial plateau fractureS/P surgery5/23 Essential hypertension-hold cozar and hctz due to elevated creatinine. -Continue Norvasc,and IV PRN's  Severe obstructive sleep apnea -Pulmonology following, continue BiPAP at night  ? Asthma with wheezing -Continue duo nebs, he received Lasix x1 on 5/20 and prednisone x1 today due to scattered wheezing  Chronic respiratory failure with hypoxia -Oxygen as needed  Coronary artery disease status post stenting in the paststable   Second-degree Mobitz type I AV block -Cardiologyconsulted.no new recommendations.  Chronic constipation-continue bowel regime.  Chronic kidney disease stage III -trending up.hold cozaar,hctz.bmp pending for today. - Anxiety -PRN Ativan        DVT prophylaxis: Lovenox  Code Status: Full code Family Communication none  Disposition Plan: Plan to discharge to SNF when bed available Consultants: Ortho  Procedures: ORIF of right lateral tibial plateau fracture Antimicrobials: None  Subjective: Patient resting in bed denies any new complaints pain is better Objective: Vitals:   11/07/17 2107 11/08/17  0553 11/08/17 0753 11/08/17 0755  BP: (!) 158/71 (!) 144/69    Pulse: 60 (!) 50    Resp:      Temp: 98.7 F (37.1 C) 97.9 F (36.6 C)    TempSrc: Oral Oral    SpO2: 95% 93% 93% 93%  Weight:      Height:        Intake/Output Summary (Last 24 hours) at 11/08/2017 1025 Last data filed at 11/08/2017 0845 Gross per 24 hour  Intake 726.83 ml  Output 900 ml  Net -173.17 ml   Filed Weights   11/01/17 1438  Weight: (!) 141.5 kg (312 lb)    Examination:  General exam: Appears calm and comfortable  Respiratory system: Clear to auscultation. Respiratory effort normal. Cardiovascular system: S1 & S2 heard, RRR. No JVD, murmurs, rubs, gallops or clicks. No pedal edema. Gastrointestinal system: Abdomen is nondistended, soft and nontender. No organomegaly or masses felt. Normal bowel sounds heard. Central nervous system: Alert and oriented. No focal neurological deficits. Extremities: RLE  Skin: No rashes, lesions or ulcers Psychiatry: Judgement and insight appear normal. Mood & affect appropriate.     Data Reviewed: I have personally reviewed following labs and imaging studies  CBC: Recent Labs  Lab 11/01/17 1702  11/04/17 0351 11/05/17 0510 11/06/17 0621 11/07/17 0515 11/08/17 0309  WBC 13.3*   < > 10.2 9.6 12.2* 11.2* 10.3  NEUTROABS 9.8*  --   --   --   --   --   --   HGB 16.5   < > 14.9 14.6 14.7 14.2 13.6  HCT 47.9   < > 45.3 44.8 45.8 43.6 42.7  MCV 89.7   < > 89.5 90.1 90.5 90.5 91.2  PLT 218   < > 193 202 217 213 220   < > =  values in this interval not displayed.   Basic Metabolic Panel: Recent Labs  Lab 11/04/17 0351 11/05/17 0510 11/06/17 0622 11/07/17 0515 11/08/17 0309  NA 136 136 134* 137 137  K 3.9 4.0 4.5 4.5 4.3  CL 99* 99* 97* 100* 101  CO2 28 28 29 29 28   GLUCOSE 119* 111* 117* 110* 107*  BUN 23* 28* 34* 34* 31*  CREATININE 1.45* 1.48* 1.48* 1.40* 1.39*  CALCIUM 10.0 10.1  10.3* 9.7 9.7 9.8   GFR: Estimated Creatinine Clearance: 70 mL/min  (A) (by C-G formula based on SCr of 1.39 mg/dL (H)). Liver Function Tests: Recent Labs  Lab 11/01/17 1702 11/05/17 0510  AST 26 26  ALT 26 31  ALKPHOS 60 50  BILITOT 1.0 1.2  PROT 7.9 6.4*  ALBUMIN 4.0 3.1*   No results for input(s): LIPASE, AMYLASE in the last 168 hours. No results for input(s): AMMONIA in the last 168 hours. Coagulation Profile: Recent Labs  Lab 11/02/17 0313 11/05/17 0510  INR 1.08 1.05   Cardiac Enzymes: No results for input(s): CKTOTAL, CKMB, CKMBINDEX, TROPONINI in the last 168 hours. BNP (last 3 results) Recent Labs    03/24/17 1634  PROBNP 190.0*   HbA1C: No results for input(s): HGBA1C in the last 72 hours. CBG: No results for input(s): GLUCAP in the last 168 hours. Lipid Profile: No results for input(s): CHOL, HDL, LDLCALC, TRIG, CHOLHDL, LDLDIRECT in the last 72 hours. Thyroid Function Tests: No results for input(s): TSH, T4TOTAL, FREET4, T3FREE, THYROIDAB in the last 72 hours. Anemia Panel: No results for input(s): VITAMINB12, FOLATE, FERRITIN, TIBC, IRON, RETICCTPCT in the last 72 hours. Sepsis Labs: No results for input(s): PROCALCITON, LATICACIDVEN in the last 168 hours.  Recent Results (from the past 240 hour(s))  Surgical pcr screen     Status: None   Collection Time: 11/03/17 11:23 AM  Result Value Ref Range Status   MRSA, PCR NEGATIVE NEGATIVE Final   Staphylococcus aureus NEGATIVE NEGATIVE Final    Comment: (NOTE) The Xpert SA Assay (FDA approved for NASAL specimens in patients 7 years of age and older), is one component of a comprehensive surveillance program. It is not intended to diagnose infection nor to guide or monitor treatment. Performed at [facility], [place] [address] [place], [place] [address]          Radiology Studies: No results found.      Scheduled Meds: . allopurinol  300 mg Oral Daily  . amLODipine  10 mg Oral Daily  . arformoterol  15 mcg Nebulization BID  . aspirin EC  81 mg Oral  Daily  . budesonide (PULMICORT) nebulizer solution  0.25 mg Nebulization BID  . cholecalciferol  2,000 Units Oral BID  . enoxaparin (LOVENOX) injection  40 mg Subcutaneous Q24H  . montelukast  10 mg Oral QHS  . pantoprazole  40 mg Oral Daily  . polyethylene glycol  17 g Oral BID  . predniSONE  30 mg Oral Once  . senna  2 tablet Oral Daily  . vitamin C  500 mg Oral Daily   Continuous Infusions: . lactated ringers 10 mL/hr at 11/07/17 0407     LOS: 7 days     Georgette Shell, MD [facility]  If 7PM-7AM, please contact night-coverage [email or web address] Password [facility] 11/08/2017, 10:25 AM

## 2017-11-08 NOTE — Progress Notes (Signed)
Orthopedic Trauma Service Progress Note   Patient ID: [name] MRN: [id number] DOB/AGE: December 19, 1945 72 y.o.  Subjective:  Doing well this am  Pain controlled Sitting up in bedside chair eating breakfast Has been in chair for about an hour   Denies any numbness or tingling  No additional issues of note   ROS  As above   Objective:   VITALS:   Vitals:   11/07/17 2107 11/08/17 0553 11/08/17 0753 11/08/17 0755  BP: (!) 158/71 (!) 144/69    Pulse: 60 (!) 50    Resp:      Temp: 98.7 F (37.1 C) 97.9 F (36.6 C)    TempSrc: Oral Oral    SpO2: 95% 93% 93% 93%  Weight:      Height:        Estimated body mass index is 41.16 kg/m as calculated from the following:   Height as of this encounter: 6\' 1"  (1.854 m).   Weight as of this encounter: 141.5 kg (312 lb).   Intake/Output      05/25 0701 - 05/26 0700 05/26 0701 - 05/27 0700   P.O. 640    I.V. (mL/kg) 86.8 (0.6)    Total Intake(mL/kg) 726.8 (5.1)    Urine (mL/kg/hr) 750 (0.2) 150 (0.4)   Stool 0 0   Total Output 750 150   Net -23.2 -150        Stool Occurrence 1 x 1 x     LABS  Results for orders placed or performed during the hospital encounter of 11/01/17 (from the past 24 hour(s))  Basic metabolic panel     Status: Abnormal   Collection Time: 11/08/17  3:09 AM  Result Value Ref Range   Sodium 137 135 - 145 mmol/L   Potassium 4.3 3.5 - 5.1 mmol/L   Chloride 101 101 - 111 mmol/L   CO2 28 22 - 32 mmol/L   Glucose, Bld 107 (H) 65 - 99 mg/dL   BUN 31 (H) 6 - 20 mg/dL   Creatinine, Ser 1.39 (H) 0.61 - 1.24 mg/dL   Calcium 9.8 8.9 - 10.3 mg/dL   GFR calc non Af Amer 49 (L) >60 mL/min   GFR calc Af Amer 56 (L) >60 mL/min   Anion gap 8 5 - 15  CBC     Status: None   Collection Time: 11/08/17  3:09 AM  Result Value Ref Range   WBC 10.3 4.0 - 10.5 K/uL   RBC 4.68 4.22 - 5.81 MIL/uL   Hemoglobin 13.6 13.0 - 17.0 g/dL   HCT 42.7 39.0 - 52.0 %   MCV 91.2 78.0 - 100.0 fL   MCH  29.1 26.0 - 34.0 pg   MCHC 31.9 30.0 - 36.0 g/dL   RDW 13.4 11.5 - 15.5 %   Platelets 220 150 - 400 K/uL     PHYSICAL EXAM:   Gen: in bedside chair, NAD, appears well Lungs: breathing unlabored Ext:       Right Lower Extremity   Dressing removed  Incisions look great    No signs of infection    No active drainage  Ext warm   + DP pulse  DPN, SPN, TN sensation intact  EHL, FHL, AT, PT, peroneals, gastroc motor intact  No DCT   Compartments are soft   No pain with passive stretch  Tolerates about 30 degrees of Passive knee flexion w/o significant pain     Assessment/Plan: 3 Days Post-Op   Active Problems:  Closed fracture of lateral portion of right tibial plateau   Closed fracture of medial plateau of right tibia   Anti-infectives (From admission, onward)   Start     Dose/Rate Route Frequency Ordered Stop   11/05/17 1800  ceFAZolin (ANCEF) IVPB 2g/100 mL premix     2 g 200 mL/hr over 30 Minutes Intravenous Every 8 hours 11/05/17 1431 11/06/17 1049   11/05/17 1035  vancomycin (VANCOCIN) powder  Status:  Discontinued       As needed 11/05/17 1035 11/05/17 1212   11/05/17 1034  tobramycin (NEBCIN) powder  Status:  Discontinued       As needed 11/05/17 1034 11/05/17 1212   11/05/17 0800  ceFAZolin (ANCEF) 3 g in dextrose 5 % 50 mL IVPB     3 g 100 mL/hr over 30 Minutes Intravenous On call to O.R. 11/04/17 1016 11/05/17 1000    .  POD/HD#: 30  72 y/o male s/p fall with R lateral tibial plateau fracture    -fall   - R lateral tibial plateau fracture, split depression, shatzker 2             NWB X 6-8 weeks             Unrestricted ROM R knee             PT/OT                         PT- please teach HEP for R knee ROM- AROM, PROM. Prone exercises as well. No ROM restrictions.  Quad sets, SLR, LAQ, SAQ, heel slides, stretching, prone flexion and extension   Ankle theraband program, heel cord stretching, toe towel curls, etc   No pillows under bend of knee when  at rest, ok to place under heel to help work on extension. Can also use zero knee bone foam if available               Ice and elevate             Dressing changed today   Dressing changes as needed from here on out   Ok to clean wounds with soap and water only    Hinged knee brace    Unlocked during the day and locked at night in full extension    - Pain management:             Continue with current regimen    - ABL anemia/Hemodynamics             Stable   - Medical issues              Per primary    - DVT/PE prophylaxis:             recommend lovenox x 30 days    - ID:              periop abx   - Metabolic Bone Disease:             + vitamin d deficiency                          Supplement    - Activity:             NWB R leg   - FEN/GI prophylaxis/Foley/Lines:             diet as tolerated      - Dispo:  continue with therapy              SNF  Ortho issues stable, can DC to snf when bed available   Follow up with ortho in 10 days   Jari Pigg, PA-C [facility] [phone] Levi Aland (C) 11/08/2017, 9:49 AM

## 2017-11-09 DIAGNOSIS — I441 Atrioventricular block, second degree: Secondary | ICD-10-CM

## 2017-11-09 LAB — BASIC METABOLIC PANEL
Anion gap: 8 (ref 5–15)
BUN: 27 mg/dL — ABNORMAL HIGH (ref 6–20)
CHLORIDE: 103 mmol/L (ref 101–111)
CO2: 28 mmol/L (ref 22–32)
Calcium: 9.9 mg/dL (ref 8.9–10.3)
Creatinine, Ser: 1.28 mg/dL — ABNORMAL HIGH (ref 0.61–1.24)
GFR calc Af Amer: >60 mL/min (ref >60)
GFR calc non Af Amer: 54 mL/min — ABNORMAL LOW (ref >60)
Glucose, Bld: 107 mg/dL — ABNORMAL HIGH (ref 65–99)
POTASSIUM: 4.3 mmol/L (ref 3.5–5.1)
SODIUM: 139 mmol/L (ref 135–145)

## 2017-11-09 LAB — CBC
HEMATOCRIT: 43.3 % (ref 39.0–52.0)
Hemoglobin: 14.1 g/dL (ref 13.0–17.0)
MCH: 29.1 pg (ref 26.0–34.0)
MCHC: 32.6 g/dL (ref 30.0–36.0)
MCV: 89.3 fL (ref 78.0–100.0)
Platelets: 235 10*3/uL (ref 150–400)
RBC: 4.85 MIL/uL (ref 4.22–5.81)
RDW: 13.1 % (ref 11.5–15.5)
WBC: 10.2 10*3/uL (ref 4.0–10.5)

## 2017-11-09 MED ORDER — POLYETHYLENE GLYCOL 3350 17 G PO PACK: 17.0000 g | PACK | Freq: Two times a day (BID) | ORAL | 0 refills | Status: DC

## 2017-11-09 MED ORDER — ASCORBIC ACID 500 MG PO TABS: 500.0000 mg | ORAL_TABLET | Freq: Every day | ORAL | Status: DC

## 2017-11-09 MED ORDER — CHOLECALCIFEROL 50 MCG (2000 UT) PO TABS: 2000.0000 [IU] | ORAL_TABLET | Freq: Two times a day (BID) | ORAL | 0 refills | Status: DC

## 2017-11-09 MED ORDER — HYDRALAZINE HCL 50 MG PO TABS: 50.0000 mg | ORAL_TABLET | Freq: Three times a day (TID) | ORAL | 0 refills | Status: DC

## 2017-11-09 MED ORDER — OXYCODONE-ACETAMINOPHEN 5-325 MG PO TABS: 1.0000 | ORAL_TABLET | ORAL | 0 refills | Status: AC | PRN

## 2017-11-09 MED ORDER — SENNA 8.6 MG PO TABS: 2.0000 | ORAL_TABLET | Freq: Every day | ORAL | 0 refills | Status: DC

## 2017-11-09 MED ORDER — HYDRALAZINE HCL 50 MG PO TABS
50.0000 mg | ORAL_TABLET | Freq: Three times a day (TID) | ORAL | Status: DC
  Administered 2017-11-09 – 2017-11-11 (×6): 50 mg via ORAL
  Filled 2017-11-09 (×6): qty 1

## 2017-11-09 MED ORDER — MONTELUKAST SODIUM 10 MG PO TABS: 10.0000 mg | ORAL_TABLET | Freq: Every day | ORAL | 0 refills | Status: DC

## 2017-11-09 MED ORDER — ENOXAPARIN SODIUM 40 MG/0.4ML ~~LOC~~ SOLN: 40.0000 mg | SUBCUTANEOUS | 0 refills | Status: DC

## 2017-11-09 NOTE — Procedures (Signed)
Placed patient on BIPAP for the night.  Patient is tolerating well at this time. 

## 2017-11-09 NOTE — Social Work (Signed)
Pt does not have SNF bed selected, and does not have insurance authorization.   [employer] is closed for holiday.  Pt unable to be discharged without insurance authorization.   MD paged, CSW will follow up with pt.   Alexander Mt, [place] Work [phone]

## 2017-11-09 NOTE — Care Management Important Message (Signed)
Important Message  Patient Details  Name: [name] MRN: [id number] Date of Birth: 1945-12-09   Medicare Important Message Given:  Yes    Barb Merino Loreena Valeri 11/09/2017, 12:54 PM

## 2017-11-09 NOTE — Progress Notes (Signed)
Progress Note  Patient Name: [name] Date of Encounter: 11/09/2017  Primary Cardiologist: Dr [name]  Subjective   No CP or dyspnea  Inpatient Medications    Scheduled Meds: . allopurinol  300 mg Oral Daily  . amLODipine  10 mg Oral Daily  . arformoterol  15 mcg Nebulization BID  . aspirin EC  81 mg Oral Daily  . budesonide (PULMICORT) nebulizer solution  0.25 mg Nebulization BID  . cholecalciferol  2,000 Units Oral BID  . enoxaparin (LOVENOX) injection  40 mg Subcutaneous Q24H  . montelukast  10 mg Oral QHS  . pantoprazole  40 mg Oral Daily  . polyethylene glycol  17 g Oral BID  . predniSONE  30 mg Oral Once  . senna  2 tablet Oral Daily  . vitamin C  500 mg Oral Daily   Continuous Infusions: . lactated ringers Stopped (11/08/17 0855)   PRN Meds: acetaminophen, albuterol, bisacodyl, guaiFENesin-dextromethorphan, LORazepam, morphine injection, oxyCODONE-acetaminophen, promethazine   Vital Signs    Vitals:   11/08/17 0755 11/08/17 1349 11/08/17 2128 11/09/17 0510  BP:  (!) 161/103 (!) 155/69 (!) 154/54  Pulse:  60 62 62  Resp:    17  Temp:  98.1 F (36.7 C) 98.3 F (36.8 C) 98.5 F (36.9 C)  TempSrc:  Oral Oral Oral  SpO2: 93% 96% 92% 94%  Weight:      Height:        Intake/Output Summary (Last 24 hours) at 11/09/2017 0842 Last data filed at 11/09/2017 4709 Gross per 24 hour  Intake 1259.17 ml  Output 950 ml  Net 309.17 ml   Filed Weights   11/01/17 1438  Weight: (!) 312 lb (141.5 kg)    Telemetry    Sinus with mobitz 1 - Personally Reviewed   Physical Exam   GEN: No acute distress.  Obese  Neck: No JVD Cardiac: irregular Respiratory: Clear to auscultation bilaterally. GI: Soft, nontender, non-distended  MS: No edema; RLE with brace in place Neuro:  Nonfocal  Psych: Normal affect   Labs    Chemistry Recent Labs  Lab 11/05/17 0510  11/07/17 0515 11/08/17 0309 11/09/17 0450  NA 136   < > 137 137 139  K 4.0   < > 4.5 4.3 4.3   CL 99*   < > 100* 101 103  CO2 28   < > 29 28 28   GLUCOSE 111*   < > 110* 107* 107*  BUN 28*   < > 34* 31* 27*  CREATININE 1.48*   < > 1.40* 1.39* 1.28*  CALCIUM 10.1  10.3*   < > 9.7 9.8 9.9  PROT 6.4*  --   --   --   --   ALBUMIN 3.1*  --   --   --   --   AST 26  --   --   --   --   ALT 31  --   --   --   --   ALKPHOS 50  --   --   --   --   BILITOT 1.2  --   --   --   --   GFRNONAA 45*   < > 48* 49* 54*  GFRAA 52*   < > 56* 56* >60  ANIONGAP 9   < > 8 8 8    < > = values in this interval not displayed.     Hematology Recent Labs  Lab 11/07/17 6283 11/08/17 0309 11/09/17 6629  WBC 11.2* 10.3 10.2  RBC 4.82 4.68 4.85  HGB 14.2 13.6 14.1  HCT 43.6 42.7 43.3  MCV 90.5 91.2 89.3  MCH 29.5 29.1 29.1  MCHC 32.6 31.9 32.6  RDW 13.4 13.4 13.1  PLT 213 220 235    Patient Profile     72 y.o. male with past medical history of coronary artery disease, prior Mobitz 1 second-degree AV block, hypertension, obstructive sleep apnea, obesity, asthma status post fall resulting in right tibial fracture now status post repair.  Assessment & Plan    1 Mobitz 1 second-degree AV block-continues to be noted on telemetry.  However patient remains asymptomatic.  No indication for pacemaker.  Avoid AV nodal blocking agents.  2 status post repair of right tibial fracture-management per orthopedics.  3 hypertension-blood pressure is elevated.  Would resume home dose of hydrochlorothiazide and losartan.  Follow renal function.  4 severe obstructive sleep apnea-continue BiPAP.  Cardiology will sign off.  Please call with questions.  For questions or updates, please contact [place] Please consult [email or web address] for contact info under Cardiology/STEMI.      Signed, Kirk Ruths, MD  11/09/2017, 8:42 AM

## 2017-11-09 NOTE — Discharge Summary (Addendum)
Physician Discharge Summary  [name] SWF:[id number] DOB: Oct 24, 1945 DOA: 11/01/2017  PCP: Maury Dus, MD  Admit date: 11/01/2017 Discharge date: 11/11/2017 Admitted From: home Disposition: snf  Recommendations for Outpatient Follow-up:  1. Follow up with PCP in 1-2 weeks 2. Please obtain BMP/CBC in one week 3. Follow up with  Ortho in 2 weeks  Home Health none Equipment/Devices:none  Discharge Condition:stable Diet recommendation:cardiac Brief/Interim Summary:72 year old male with history of obesity, chronic respiratory failure with hypoxia, obstructive sleep apnea on BiPAP, coronary artery disease with prior stents, hypertension, hyperlipidemia, chronic second-degree Mobitz 1 AV block, who sustained a mechanical fall at home on the day on admission presents with right tibial plateau fracture. Orthopedic surgery consulted. Pulmonology and cardiology consulted for preoperative clearance. Plans are for patient to undergo operative repair on Thursday 5/23   Discharge Diagnoses:  Active Problems:   Closed fracture of lateral portion of right tibial plateau   Closed fracture of medial plateau of right tibia  Right displaced tibial plateau fractureS/P surgery5/23-NWB 6 to 8 weeks.   Per ortho-  R lateral tibial plateau fracture, split depression, shatzker2 NWB X 6-8 weeks Unrestricted ROM R knee PT/OT PT- please teach HEP forRknee ROM- AROM, PROM. Prone exercises as well. No ROM restrictions. Quad sets, SLR, LAQ, SAQ, heel slides, stretching, prone flexion and extension  Ankle theraband program, heel cord stretching, toe towel curls, etc  No pillows under bend of knee when at rest, ok to place under heel to help work on extension. Can also use zero knee bone foam if available  Ice and elevate Dressing changed today                         Dressing changes as needed from here on  out                         Ok to clean wounds with soap and water only               Hinged knee brace                          Unlocked during the day and locked at night in full extension        Essential hypertension-hold cozar and hctzdue to elevated creatinine. -Continue Norvasc,and add hydralazine.  Severe obstructive sleep apnea - continue BiPAP at night  Chronic respiratory failure with hypoxia -Oxygen as needed  Coronary artery disease status post stenting in the paststable   Second-degree Mobitz type I AV block -Cardiologyconsulted.no new recommendations.avoid av nodal blocking agents.patient asymptomatic.no indication for pacemaker.  Chronic constipation-continue dulcolax,senna,miralax.  Chronic kidney disease stage III -hold cozaar,hctz.   Discharge Instructions  Discharge Instructions    Call MD for:  difficulty breathing, headache or visual disturbances   Complete by:  As directed    Call MD for:  extreme fatigue   Complete by:  As directed    Call MD for:  persistant dizziness or light-headedness   Complete by:  As directed    Call MD for:  persistant nausea and vomiting   Complete by:  As directed    Call MD for:  redness, tenderness, or signs of infection (pain, swelling, redness, odor or green/yellow discharge around incision site)   Complete by:  As directed    Call MD for:  severe uncontrolled pain   Complete by:  As directed  Call MD for:  temperature >100.4   Complete by:  As directed    Diet - low sodium heart healthy   Complete by:  As directed    Increase activity slowly   Complete by:  As directed      Allergies as of 11/09/2017      Reactions   Prednisone       Medication List    STOP taking these medications   hydrochlorothiazide 25 MG tablet Commonly known as:  HYDRODIURIL   losartan 100 MG tablet Commonly known as:  COZAAR   naproxen sodium 220 MG tablet Commonly known as:  ALEVE   nitroGLYCERIN 0.4  MG SL tablet Commonly known as:  NITROSTAT   Tiotropium Bromide Monohydrate 1.25 MCG/ACT Aers Commonly known as:  SPIRIVA RESPIMAT     TAKE these medications   acetaminophen 500 MG tablet Commonly known as:  TYLENOL Take 500 mg by mouth every 6 (six) hours as needed for moderate pain.   allopurinol 300 MG tablet Commonly known as:  ZYLOPRIM Take 300 mg by mouth Daily.   amLODipine 10 MG tablet Commonly known as:  NORVASC Take 10 mg by mouth Daily.   ascorbic acid 500 MG tablet Commonly known as:  VITAMIN C Take 1 tablet (500 mg total) by mouth daily.   aspirin 81 MG EC tablet Take 1 tablet (81 mg total) by mouth daily.   Cholecalciferol 2000 units Tabs Take 1 tablet (2,000 Units total) by mouth 2 (two) times daily.   enoxaparin 40 MG/0.4ML injection Commonly known as:  LOVENOX Inject 0.4 mLs (40 mg total) into the skin daily.   hydrALAZINE 50 MG tablet Commonly known as:  APRESOLINE Take 1 tablet (50 mg total) by mouth 3 (three) times daily.   montelukast 10 MG tablet Commonly known as:  SINGULAIR Take 1 tablet (10 mg total) by mouth at bedtime.   oxyCODONE-acetaminophen 5-325 MG tablet Commonly known as:  PERCOCET/ROXICET Take 1 tablet by mouth every 4 (four) hours as needed for up to 7 days for moderate pain.   pantoprazole 40 MG tablet Commonly known as:  PROTONIX   polyethylene glycol packet Commonly known as:  MIRALAX / GLYCOLAX Take 17 g by mouth 2 (two) times daily.   senna 8.6 MG Tabs tablet Commonly known as:  SENOKOT Take 2 tablets (17.2 mg total) by mouth daily.      Follow-up Information    Haddix, Thomasene Lot, MD. Schedule an appointment as soon as possible for a visit in 10 day(s).   Specialty:  Orthopedic Surgery Contact information: [address] [place] [address] [phone]        Maury Dus, MD Follow up.   Specialty:  Family Medicine Contact information: [place]  [address] [phone]          Allergies  Allergen Reactions  . Prednisone     Consultations:  Ortho,cardiology   Procedures/Studies: Dg Knee 1-2 Views Right  Result Date: 11/05/2017 CLINICAL DATA:  Right tibial plateau fracture ORIF. EXAM: DG C-ARM 61-120 MIN; RIGHT KNEE - 1-2 VIEW COMPARISON:  CT right knee dated Nov 01, 2017. FINDINGS: Multiple intraoperative x-rays demonstrate interval lateral plate and screw fixation of the lateral tibial plateau fracture, now in anatomic alignment. No evidence of hardware complication. IMPRESSION: 1. Lateral tibial plateau fracture ORIF in anatomic alignment. FLUOROSCOPY TIME:  2 minutes, 10 seconds. C-arm fluoroscopic images were obtained intraoperatively and submitted for post operative interpretation. Electronically  Signed   By: Titus Dubin M.D.   On: 11/05/2017 11:54   Ct Knee Right Wo Contrast  Result Date: 11/01/2017 CLINICAL DATA:  Knee fracture EXAM: CT OF THE right KNEE WITHOUT CONTRAST TECHNIQUE: Multidetector CT imaging of the right knee was performed according to the standard protocol. Multiplanar CT image reconstructions were also generated. COMPARISON:  Radiograph 11/01/2017 FINDINGS: Bones/Joint/Cartilage Acute comminuted fracture involving the lateral plateau of the tibia with approximately 9 mm of central depression. Fracture lucency extends to the intercondylar region of the proximal tibia and the posterior articular surface of the proximal tibia. No lucency evident within the medial plateau of the tibia. Fibular head appears intact. No distal femoral fracture. No patellar fracture. Moderate hemarthrosis. Moderate arthritis of the medial knee joint with subchondral sclerosis and moderate narrowing. Mild spurring. Mild patellofemoral degenerative change with superior and inferior patellar spurring. Ligaments Suboptimally assessed by CT. Muscles and Tendons Mild muscular atrophy. Quadriceps tendon and patellar tendon appear grossly  intact. Soft tissues Rim calcified lucent lesion within the popliteal fossa, may relate to dystrophic calcification or old trauma. Generalized soft tissue swelling. IMPRESSION: 1. Acute comminuted lateral tibial plateau fracture with about 9 mm central depression and extension of fracture lucency to the inter spinous region of the proximal tibia. Associated moderate hemarthrosis. 2. No other acute osseous abnormality seen. Moderate arthritis of the knee at the medial compartment. Electronically Signed   By: Donavan Foil M.D.   On: 11/01/2017 20:04   Dg Chest Port 1 View  Result Date: 11/01/2017 CLINICAL DATA:  Near syncope. EXAM: PORTABLE CHEST 1 VIEW COMPARISON:  03/24/2017 and prior radiograph FINDINGS: Cardiomegaly noted. There is no evidence of focal airspace disease, pulmonary edema, suspicious pulmonary nodule/mass, pleural effusion, or pneumothorax. No acute bony abnormalities are identified. IMPRESSION: Cardiomegaly without evidence of acute cardiopulmonary disease. Electronically Signed   By: Margarette Canada M.D.   On: 11/01/2017 18:21   Dg Knee Complete 4 Views Right  Result Date: 11/01/2017 CLINICAL DATA:  Right knee pain after fall. EXAM: RIGHT KNEE - COMPLETE 4+ VIEW COMPARISON:  Right knee x-rays dated November 19, 2016. FINDINGS: Acute, mildly depressed fracture of the lateral tibial plateau. No large joint effusion. Mild tricompartmental osteoarthritis. Unchanged large 3.4 cm calcified loose body likely within a Baker's cyst. Osteopenia. Soft tissues are unremarkable. IMPRESSION: 1. Acute, mildly depressed fracture of the lateral tibial plateau. Electronically Signed   By: Titus Dubin M.D.   On: 11/01/2017 15:28   Dg Knee Right Port  Result Date: 11/05/2017 CLINICAL DATA:  Status post open reduction and internal fixation of right tibial fracture. EXAM: PORTABLE RIGHT KNEE - 1-2 VIEW COMPARISON:  Fluoroscopic images of same day. FINDINGS: Patient is status post surgical internal fixation of  lateral tibial plateau fracture. Good alignment of fracture components is noted. Expected postoperative changes are noted in the surrounding soft tissues. IMPRESSION: Status post surgical internal fixation of lateral tibial plateau fracture. Electronically Signed   By: Marijo Conception, M.D.   On: 11/05/2017 12:43   Dg C-arm 1-60 Min  Result Date: 11/05/2017 CLINICAL DATA:  Right tibial plateau fracture ORIF. EXAM: DG C-ARM 61-120 MIN; RIGHT KNEE - 1-2 VIEW COMPARISON:  CT right knee dated Nov 01, 2017. FINDINGS: Multiple intraoperative x-rays demonstrate interval lateral plate and screw fixation of the lateral tibial plateau fracture, now in anatomic alignment. No evidence of hardware complication. IMPRESSION: 1. Lateral tibial plateau fracture ORIF in anatomic alignment. FLUOROSCOPY TIME:  2 minutes, 10 seconds. C-arm fluoroscopic  images were obtained intraoperatively and submitted for post operative interpretation. Electronically Signed   By: Titus Dubin M.D.   On: 11/05/2017 11:54    (Echo, Carotid, EGD, Colonoscopy, ERCP)    Subjective:   Discharge Exam: Vitals:   11/08/17 2128 11/09/17 0510  BP: (!) 155/69 (!) 154/54  Pulse: 62 62  Resp:  17  Temp: 98.3 F (36.8 C) 98.5 F (36.9 C)  SpO2: 92% 94%   Vitals:   11/08/17 0755 11/08/17 1349 11/08/17 2128 11/09/17 0510  BP:  (!) 161/103 (!) 155/69 (!) 154/54  Pulse:  60 62 62  Resp:    17  Temp:  98.1 F (36.7 C) 98.3 F (36.8 C) 98.5 F (36.9 C)  TempSrc:  Oral Oral Oral  SpO2: 93% 96% 92% 94%  Weight:      Height:        General: Pt is alert, awake, not in acute distress Cardiovascular: RRR, S1/S2 +, no rubs, no gallops Respiratory: CTA bilaterally, no wheezing, no rhonchi Abdominal: Soft, NT, ND, bowel sounds + Extremities: no edema, no cyanosis    The results of significant diagnostics from this hospitalization (including imaging, microbiology, ancillary and laboratory) are listed below for reference.      Microbiology: Recent Results (from the past 240 hour(s))  Surgical pcr screen     Status: None   Collection Time: 11/03/17 11:23 AM  Result Value Ref Range Status   MRSA, PCR NEGATIVE NEGATIVE Final   Staphylococcus aureus NEGATIVE NEGATIVE Final    Comment: (NOTE) The Xpert SA Assay (FDA approved for NASAL specimens in patients 31 years of age and older), is one component of a comprehensive surveillance program. It is not intended to diagnose infection nor to guide or monitor treatment. Performed at [facility], [place] [address] [place], [place] [address]      Labs: BNP (last 3 results) No results for input(s): BNP in the last 8760 hours. Basic Metabolic Panel: Recent Labs  Lab 11/05/17 0510 11/06/17 0622 11/07/17 0515 11/08/17 0309 11/09/17 0450  NA 136 134* 137 137 139  K 4.0 4.5 4.5 4.3 4.3  CL 99* 97* 100* 101 103  CO2 28 29 29 28 28   GLUCOSE 111* 117* 110* 107* 107*  BUN 28* 34* 34* 31* 27*  CREATININE 1.48* 1.48* 1.40* 1.39* 1.28*  CALCIUM 10.1  10.3* 9.7 9.7 9.8 9.9   Liver Function Tests: Recent Labs  Lab 11/05/17 0510  AST 26  ALT 31  ALKPHOS 50  BILITOT 1.2  PROT 6.4*  ALBUMIN 3.1*   No results for input(s): LIPASE, AMYLASE in the last 168 hours. No results for input(s): AMMONIA in the last 168 hours. CBC: Recent Labs  Lab 11/05/17 0510 11/06/17 2876 11/07/17 0515 11/08/17 0309 11/09/17 0450  WBC 9.6 12.2* 11.2* 10.3 10.2  HGB 14.6 14.7 14.2 13.6 14.1  HCT 44.8 45.8 43.6 42.7 43.3  MCV 90.1 90.5 90.5 91.2 89.3  PLT 202 217 213 220 235   Cardiac Enzymes: No results for input(s): CKTOTAL, CKMB, CKMBINDEX, TROPONINI in the last 168 hours. BNP: Invalid input(s): POCBNP CBG: No results for input(s): GLUCAP in the last 168 hours. D-Dimer No results for input(s): DDIMER in the last 72 hours. Hgb A1c No results for input(s): HGBA1C in the last 72 hours. Lipid Profile No results for input(s): CHOL, HDL, LDLCALC, TRIG, CHOLHDL,  LDLDIRECT in the last 72 hours. Thyroid function studies No results for input(s): TSH, T4TOTAL, T3FREE, THYROIDAB in the last 72  hours.  Invalid input(s): FREET3 Anemia work up No results for input(s): VITAMINB12, FOLATE, FERRITIN, TIBC, IRON, RETICCTPCT in the last 72 hours. Urinalysis    Component Value Date/Time   COLORURINE YELLOW 03/24/2009 2110   APPEARANCEUR CLEAR 03/24/2009 2110   LABSPEC 1.030 03/24/2009 2110   PHURINE 5.0 03/24/2009 2110   GLUCOSEU NEGATIVE 03/24/2009 2110   HGBUR NEGATIVE 03/24/2009 2110   BILIRUBINUR SMALL (A) 03/24/2009 2110   KETONESUR NEGATIVE 03/24/2009 2110   PROTEINUR NEGATIVE 03/24/2009 2110   UROBILINOGEN 0.2 03/24/2009 2110   NITRITE NEGATIVE 03/24/2009 2110   LEUKOCYTESUR  03/24/2009 2110    NEGATIVE MICROSCOPIC NOT DONE ON URINES WITH NEGATIVE PROTEIN, BLOOD, LEUKOCYTES, NITRITE, OR GLUCOSE <1000 mg/dL.   Sepsis Labs Invalid input(s): PROCALCITONIN,  WBC,  LACTICIDVEN Microbiology Recent Results (from the past 240 hour(s))  Surgical pcr screen     Status: None   Collection Time: 11/03/17 11:23 AM  Result Value Ref Range Status   MRSA, PCR NEGATIVE NEGATIVE Final   Staphylococcus aureus NEGATIVE NEGATIVE Final    Comment: (NOTE) The Xpert SA Assay (FDA approved for NASAL specimens in patients 63 years of age and older), is one component of a comprehensive surveillance program. It is not intended to diagnose infection nor to guide or monitor treatment. Performed at [facility], [place] [address], [place], [place] [address]      Time coordinating discharge: 34 minutes  SIGNED:   Georgette Shell, MD  [facility] 11/09/2017, 8:42 AM Pager   If 7PM-7AM, please contact night-coverage [email or web address] Password [email or web address]

## 2017-11-09 NOTE — Progress Notes (Signed)
PROGRESS NOTE    [name]  QHU:[id number] DOB: 11-Dec-1945 DOA: 11/01/2017 PCP: Maury Dus, MD  Brief Narrative:72 year old male with history of obesity, chronic respiratory failure with hypoxia, obstructive sleep apnea on BiPAP, coronary artery disease with prior stents, hypertension, hyperlipidemia, chronic second-degree Mobitz 1 AV block, who sustained a mechanical fall at home on the day on admission presents with right tibial plateau fracture. Orthopedic surgery consulted. Pulmonology and cardiology consulted for preoperative clearance. Plans are for patient to undergo operative repair on Thursday 5/23.     Assessment & Plan:   Active Problems:   Closed fracture of lateral portion of right tibial plateau   Closed fracture of medial plateau of right tibia  Right displaced tibial plateau fractureS/P surgery5/23 Essential hypertension-hold cozar and hctzdue to elevated creatinine. -Continue Norvasc,add hydralazine  Severe obstructive sleep apnea  continue BiPAP at night  Chronic respiratory failure with hypoxia -Oxygen as needed  Coronary artery disease status post stenting in the paststable   Second-degree Mobitz type I AV block -Cardiologyconsulted.no new recommendations.  Chronic constipation-continue bowel regime.  Chronic kidney disease stage III -trending up.hold cozaar,hctz.bmp pending for today. -      DVT prophylaxis: lovenox Code Status:full Family Communication none  Disposition Plan: dc when snf bed available  Consultants: ortho,cardio,pulm  Procedures:ORIF of right lateral tibial plateau fracture   Antimicrobials:none  Subjective: Resting in bed .had bm yesterday  Objective: Vitals:   11/09/17 0510 11/09/17 0851 11/09/17 0855 11/09/17 0909  BP: (!) 154/54   (!) 147/76  Pulse: 62   (!) 56  Resp: 17     Temp: 98.5 F (36.9 C)     TempSrc: Oral     SpO2: 94% 95% 95%   Weight:      Height:         Intake/Output Summary (Last 24 hours) at 11/09/2017 1029 Last data filed at 11/09/2017 1009 Gross per 24 hour  Intake 934.17 ml  Output 1300 ml  Net -365.83 ml   Filed Weights   11/01/17 1438  Weight: (!) 141.5 kg (312 lb)    Examination:  General exam: Appears calm and comfortable  Respiratory system: Clear to auscultation. Respiratory effort normal. Cardiovascular system: S1 & S2 heard, RRR. No JVD, murmurs, rubs, gallops or clicks. No pedal edema. Gastrointestinal system: Abdomen is nondistended, soft and nontender. No organomegaly or masses felt. Normal bowel sounds heard. Central nervous system: Alert and oriented. No focal neurological deficits. Extremities: Symmetric 5 x 5 power. Skin: No rashes, lesions or ulcers Psychiatry: Judgement and insight appear normal. Mood & affect appropriate.     Data Reviewed: I have personally reviewed following labs and imaging studies  CBC: Recent Labs  Lab 11/05/17 0510 11/06/17 0621 11/07/17 0515 11/08/17 0309 11/09/17 0450  WBC 9.6 12.2* 11.2* 10.3 10.2  HGB 14.6 14.7 14.2 13.6 14.1  HCT 44.8 45.8 43.6 42.7 43.3  MCV 90.1 90.5 90.5 91.2 89.3  PLT 202 217 213 220 465   Basic Metabolic Panel: Recent Labs  Lab 11/05/17 0510 11/06/17 0622 11/07/17 0515 11/08/17 0309 11/09/17 0450  NA 136 134* 137 137 139  K 4.0 4.5 4.5 4.3 4.3  CL 99* 97* 100* 101 103  CO2 28 29 29 28 28   GLUCOSE 111* 117* 110* 107* 107*  BUN 28* 34* 34* 31* 27*  CREATININE 1.48* 1.48* 1.40* 1.39* 1.28*  CALCIUM 10.1  10.3* 9.7 9.7 9.8 9.9   GFR: Estimated Creatinine Clearance: 76 mL/min (A) (by C-G formula based on SCr  of 1.28 mg/dL (H)). Liver Function Tests: Recent Labs  Lab 11/05/17 0510  AST 26  ALT 31  ALKPHOS 50  BILITOT 1.2  PROT 6.4*  ALBUMIN 3.1*   No results for input(s): LIPASE, AMYLASE in the last 168 hours. No results for input(s): AMMONIA in the last 168 hours. Coagulation Profile: Recent Labs  Lab 11/05/17 0510   INR 1.05   Cardiac Enzymes: No results for input(s): CKTOTAL, CKMB, CKMBINDEX, TROPONINI in the last 168 hours. BNP (last 3 results) Recent Labs    03/24/17 1634  PROBNP 190.0*   HbA1C: No results for input(s): HGBA1C in the last 72 hours. CBG: No results for input(s): GLUCAP in the last 168 hours. Lipid Profile: No results for input(s): CHOL, HDL, LDLCALC, TRIG, CHOLHDL, LDLDIRECT in the last 72 hours. Thyroid Function Tests: No results for input(s): TSH, T4TOTAL, FREET4, T3FREE, THYROIDAB in the last 72 hours. Anemia Panel: No results for input(s): VITAMINB12, FOLATE, FERRITIN, TIBC, IRON, RETICCTPCT in the last 72 hours. Sepsis Labs: No results for input(s): PROCALCITON, LATICACIDVEN in the last 168 hours.  Recent Results (from the past 240 hour(s))  Surgical pcr screen     Status: None   Collection Time: 11/03/17 11:23 AM  Result Value Ref Range Status   MRSA, PCR NEGATIVE NEGATIVE Final   Staphylococcus aureus NEGATIVE NEGATIVE Final    Comment: (NOTE) The Xpert SA Assay (FDA approved for NASAL specimens in patients 77 years of age and older), is one component of a comprehensive surveillance program. It is not intended to diagnose infection nor to guide or monitor treatment. Performed at [facility], [place] [address] [place], [place] [address]          Radiology Studies: No results found.      Scheduled Meds: . allopurinol  300 mg Oral Daily  . amLODipine  10 mg Oral Daily  . arformoterol  15 mcg Nebulization BID  . aspirin EC  81 mg Oral Daily  . budesonide (PULMICORT) nebulizer solution  0.25 mg Nebulization BID  . cholecalciferol  2,000 Units Oral BID  . enoxaparin (LOVENOX) injection  40 mg Subcutaneous Q24H  . hydrALAZINE  50 mg Oral Q8H  . montelukast  10 mg Oral QHS  . pantoprazole  40 mg Oral Daily  . polyethylene glycol  17 g Oral BID  . predniSONE  30 mg Oral Once  . senna  2 tablet Oral Daily  . vitamin C  500 mg Oral Daily    Continuous Infusions: . lactated ringers Stopped (11/08/17 0855)     LOS: 8 days     Georgette Shell, MD [facility]  If 7PM-7AM, please contact night-coverage [email or web address] Password [email or web address] 11/09/2017, 10:29 AM

## 2017-11-09 NOTE — Clinical Social Work Note (Signed)
Clinical Social Work Assessment  Patient Details  Name: [name] MRN: [id number] Date of Birth: [birth year]  Date of referral:  11/09/17               Reason for consult:  Facility Placement, Discharge Planning                Permission sought to share information with:  Facility Sport and exercise psychologist, Psychiatrist Permission granted to share information::     Name::     Terrence Dupont  Agency::  SNFs  Relationship::  wife  Contact Information:    [phone]  Housing/Transportation Living arrangements for the past 2 months:  Single Family Home Source of Information:  Patient Patient Interpreter Needed:  None Criminal Activity/Legal Involvement Pertinent to Current Situation/Hospitalization:  No - Comment as needed Significant Relationships:  Other Family Members, Adult Children, Spouse Lives with:  Spouse Do you feel safe going back to the place where you live?  Yes Need for family participation in patient care:  Yes (Comment)(pt cares for his wife, so will need support )  Care giving concerns:  Pt lives with wife in [place], pt states he takes care of his wife normally, and she is currently staying with his son. Pt states he knows he needs rehab to get stronger. Pt is currently non weight bearing, requires assistance with ADLs.    Social Worker assessment / plan:  CSW spoke with pt at bedside, pt is from [facility], he lives with his wife and helps take care of her. Pt has two children (one living and one deceased) and 6 grandchildren. Pt states he has a supportive family and they are helping to take care of his wife while he gets back on his feet. Pt states that he has a preference for [facility] in [place].   CSW has sent out referral to [facility], unsure as to if they take [facility] and the insurance company/admissions at facility are both closed for the holiday. CSW will follow up tomorrow morning, pt aware and aware he may have to make  another SNF choice.   Employment status:  Retired Nurse, adult PT Recommendations:  [place], [place] / Referral to community resources:  [place]  Patient/Family's Response to care:  Pt friendly, amenable to [place] visit and SNF recommendations. Pt looks forward to hearing back from [facility]   Patient/Family's Understanding of and Emotional Response to Diagnosis, Current Treatment, and Prognosis:  Pt is understanding of diagnosis, current treatment and prognosis. Pt states reasonable expectations for therapies and ongoing care needs. Pt is motivated to get well and back taking care of wife. Pt smiling and positive during CSW visit.   Emotional Assessment Appearance:  Appears stated age Attitude/Demeanor/Rapport:    Affect (typically observed):  Accepting, Adaptable, Appropriate Orientation:  Oriented to Place, Oriented to Self, Oriented to  Time, Oriented to Situation Alcohol / Substance use:  Not Applicable Psych involvement (Current and /or in the community):  No (Comment)  Discharge Needs  Concerns to be addressed:  Care Coordination, Discharge Planning Concerns Readmission within the last 30 days:    Current discharge risk:  Dependent with Mobility, Physical Impairment Barriers to Discharge:  Continued Medical Work up, [employer], [place] 11/09/2017, 12:49 PM

## 2017-11-10 NOTE — Plan of Care (Signed)
  Problem: Education: Goal: Knowledge of General Education information will improve Outcome: Progressing   Problem: Clinical Measurements: Goal: Will remain free from infection Outcome: Progressing Goal: Respiratory complications will improve Outcome: Progressing   Problem: Nutrition: Goal: Adequate nutrition will be maintained Outcome: Progressing

## 2017-11-10 NOTE — Social Work (Addendum)
CSW spoke with pt x3, without support from family pt does not feel confident in making decision alone, pt finally able to get wife [name] on his cell phone to support with SNF decision.  Pt wife selected [facility] as first choice.   12:21pm- [facility] unable to offer bed today to start insurance authorization. CSW to call pt wife to discuss second choice.   12:33pm- [name] has decided on [facility]. They will intiate insurance auth, await updated PT/OT clinicals, aware therapies are actively working with pt at this time.   3:31pm- Continue to await insurance authorization.   4:28pm- Recent therapy notes sent through hub to SNF, continue to await insurance authorization.   Alexander Mt, [place] Work [phone]

## 2017-11-10 NOTE — Progress Notes (Signed)
Physical Therapy Treatment Patient Details Name: [name] MRN: [id number] DOB: 14-Sep-1945 Today's Date: 11/10/2017    History of Present Illness Pt is a 72 year old man admitted after a fall on 11/01/17 resulting in R tibia fx. s/p ORIF 11/05/17. PMH: obesity, chronic respiratory failure, OSA on bipap, CAD with stents, HTN, asthma, CKD, anxiety, gout.    PT Comments    Pt progressing with mobility. Stood from chair 3x with mod A +2 and hopped 5' with RW and min A +2, pt limited with distance due to fatigue and SOB. Performed exercises as instructed by physician as well as going over ankle exercises with theraband which was left with pt.  PT will continue to follow.   Follow Up Recommendations  SNF;Supervision/Assistance - 24 hour     Equipment Recommendations  None recommended by PT    Recommendations for Other Services       Precautions / Restrictions Precautions Precautions: Fall Required Braces or Orthoses: Other Brace/Splint Other Brace/Splint: hinged knee brace in unlocked position Restrictions Weight Bearing Restrictions: Yes RLE Weight Bearing: Non weight bearing    Mobility  Bed Mobility               General bed mobility comments: pt received in chair  Transfers Overall transfer level: Needs assistance Equipment used: Rolling walker (2 wheeled) Transfers: Sit to/from Stand Sit to Stand: +2 physical assistance;Mod assist         General transfer comment: pt stood from recliner 3x, mod A for power up and fwd wt shift with support as he transferred hands from chair to RW  Ambulation/Gait Ambulation/Gait assistance: Min assist;+2 physical assistance Ambulation Distance (Feet): 5 Feet Assistive device: Rolling walker (2 wheeled) Gait Pattern/deviations: Step-to pattern Gait velocity: decreased Gait velocity interpretation: <1.31 ft/sec, indicative of household ambulator General Gait Details: pt able to take very small hops on LLE and keep RLE NWB,  could not tolerate further distance due to SOB and fatigue, was also fearful of falling, chair kept right behind him   Stairs             Wheelchair Mobility    Modified Rankin (Stroke Patients Only)       Balance Overall balance assessment: Needs assistance Sitting-balance support: Feet supported Sitting balance-Leahy Scale: Good     Standing balance support: During functional activity;Bilateral upper extremity supported Standing balance-Leahy Scale: Poor Standing balance comment: unable to stand without support                            Cognition Arousal/Alertness: Awake/alert Behavior During Therapy: WFL for tasks assessed/performed Overall Cognitive Status: Within Functional Limits for tasks assessed                                        Exercises General Exercises - Lower Extremity Ankle Circles/Pumps: AROM;Both;10 reps;Seated Quad Sets: AROM;Both;10 reps;Seated Long Arc Quad: Right;10 reps;Seated;AAROM Hip ABduction/ADduction: AAROM;Right;10 reps;Seated Straight Leg Raises: AAROM;Right;10 reps;Seated Other Exercises Other Exercises: pt given green theraband and performed ankle pf, df, inversion, and eversion against resistance with assist from therapist to hold band at different positions. 10x each plane Other Exercises: assisted knee flexion in sitting x30 sec    General Comments        Pertinent Vitals/Pain Pain Assessment: Faces Faces Pain Scale: Hurts even more Pain Location: R knee with  stretching Pain Descriptors / Indicators: Sore;Grimacing;Guarding Pain Intervention(s): Limited activity within patient's tolerance;Monitored during session    Home Living                      Prior Function            PT Goals (current goals can now be found in the care plan section) Acute Rehab PT Goals Patient Stated Goal: get stronger PT Goal Formulation: With patient Time For Goal Achievement: 11/20/17 Potential  to Achieve Goals: Good Progress towards PT goals: Progressing toward goals    Frequency    Min 3X/week      PT Plan Current plan remains appropriate    Co-evaluation              AM-PAC PT "6 Clicks" Daily Activity  Outcome Measure  Difficulty turning over in bed (including adjusting bedclothes, sheets and blankets)?: Unable Difficulty moving from lying on back to sitting on the side of the bed? : Unable Difficulty sitting down on and standing up from a chair with arms (e.g., wheelchair, bedside commode, etc,.)?: Unable Help needed moving to and from a bed to chair (including a wheelchair)?: A Lot Help needed walking in hospital room?: A Lot Help needed climbing 3-5 steps with a railing? : Total 6 Click Score: 8    End of Session Equipment Utilized During Treatment: Gait belt;Other (comment)(hinged knee brace) Activity Tolerance: Patient limited by fatigue Patient left: in chair;with call bell/phone within reach Nurse Communication: Mobility status PT Visit Diagnosis: Other abnormalities of gait and mobility (R26.89);Pain Pain - Right/Left: Right Pain - part of body: Leg     Time: 1610-9604 PT Time Calculation (min) (ACUTE ONLY): 31 min  Charges:  $Gait Training: 8-22 mins $Therapeutic Exercise: 8-22 mins                    G Codes:       [place]  [place] 11/10/2017, 1:31 PM

## 2017-11-10 NOTE — Progress Notes (Signed)
Occupational Therapy Treatment Patient Details Name: [name] MRN: [id number] DOB: Feb 04, 1946 Today's Date: 11/10/2017    History of present illness Pt is a 72 year old man admitted after a fall on 11/01/17 resulting in R tibia fx. s/p ORIF 11/05/17. PMH: obesity, chronic respiratory failure, OSA on bipap, CAD with stents, HTN, asthma, CKD, anxiety, gout.   OT comments  Pt presents sitting up in recliner, agreeable to OT tx session. Pt participating in UB exercises using level 3 theraband for added strengthening/endurance when completing functional transfers and ADLs. Pt fatigued after exercise completion, though demonstrates motivation to work and progress with therapy. Pt completing seated grooming ADLs with setup assist, continues to require max-total assist for LB ADLs.  Feel POC remains appropriate at this time. Will continue to follow acutely to progress pt towards established OT goals.    Follow Up Recommendations  SNF;Supervision/Assistance - 24 hour    Equipment Recommendations  Other (comment)(TBD in next venue )          Precautions / Restrictions Precautions Precautions: Fall Required Braces or Orthoses: Other Brace/Splint Other Brace/Splint: hinged knee brace in unlocked position Restrictions Weight Bearing Restrictions: Yes RLE Weight Bearing: Non weight bearing       Mobility Bed Mobility               General bed mobility comments: pt received in chair  Transfers Overall transfer level: Needs assistance Equipment used: Rolling walker (2 wheeled) Transfers: Sit to/from Stand Sit to Stand: +2 physical assistance;Mod assist         General transfer comment: pt stood from recliner 3x, mod A for power up and fwd wt shift with support as he transferred hands from chair to RW    Balance Overall balance assessment: Needs assistance Sitting-balance support: Feet supported Sitting balance-Leahy Scale: Good     Standing balance support: During  functional activity;Bilateral upper extremity supported Standing balance-Leahy Scale: Poor Standing balance comment: unable to stand without support                           ADL either performed or assessed with clinical judgement   ADL Overall ADL's : Needs assistance/impaired Eating/Feeding: Sitting;Modified independent   Grooming: Set up;Sitting                                 General ADL Comments: pt completing grooming ADLs seated in recliner and seated UB exercises for added strengthening/endurance; pt motivated to participate though does exhibit decreased activity tolerance                       Cognition Arousal/Alertness: Awake/alert Behavior During Therapy: WFL for tasks assessed/performed Overall Cognitive Status: Within Functional Limits for tasks assessed                                          Exercises Exercises: General Lower Extremity General Exercises - Upper Extremity Shoulder Flexion: AROM;10 reps;Theraband;Both;Seated Theraband Level (Shoulder Flexion): Level 3 (Green) Shoulder Extension: AROM;Both;10 reps;Seated;Theraband Theraband Level (Shoulder Extension): Level 3 (Green) Shoulder Horizontal ABduction: AROM;10 reps;Seated;Both;Theraband Theraband Level (Shoulder Horizontal Abduction): Level 3 (Green) Shoulder Horizontal ADduction: AROM;10 reps;Both;Seated;Theraband Theraband Level (Shoulder Horizontal Adduction): Level 3 (Green) Elbow Flexion: AROM;10 reps;Both;Seated;Theraband Theraband Level (Elbow Flexion): Level 3 (Green)  Elbow Extension: AROM;10 reps;Seated;Both;Theraband Theraband Level (Elbow Extension): Level 3 (Green) General Exercises - Lower Extremity Ankle Circles/Pumps: AROM;Both;10 reps;Seated Quad Sets: AROM;Both;10 reps;Seated Long Arc Quad: Right;10 reps;Seated;AAROM Hip ABduction/ADduction: AAROM;Right;10 reps;Seated Straight Leg Raises: AAROM;Right;10 reps;Seated Other  Exercises Other Exercises: pt given green theraband and performed ankle pf, df, inversion, and eversion against resistance with assist from therapist to hold band at different positions. 10x each plane Other Exercises: assisted knee flexion in sitting x30 sec                Pertinent Vitals/ Pain       Pain Assessment: Faces Faces Pain Scale: Hurts little more Pain Location: R knee  Pain Descriptors / Indicators: Grimacing;Sore Pain Intervention(s): Monitored during session;Repositioned                                                          Frequency  Min 2X/week        Progress Toward Goals  OT Goals(current goals can now be found in the care plan section)  Progress towards OT goals: Progressing toward goals  Acute Rehab OT Goals Patient Stated Goal: get stronger OT Goal Formulation: With patient Time For Goal Achievement: 11/20/17 Potential to Achieve Goals: Good  Plan Discharge plan remains appropriate                     AM-PAC PT "6 Clicks" Daily Activity     Outcome Measure   Help from another person eating meals?: None Help from another person taking care of personal grooming?: A Little Help from another person toileting, which includes using toliet, bedpan, or urinal?: Total Help from another person bathing (including washing, rinsing, drying)?: A Lot Help from another person to put on and taking off regular upper body clothing?: None Help from another person to put on and taking off regular lower body clothing?: Total 6 Click Score: 15    End of Session Equipment Utilized During Treatment: (knee brace )  OT Visit Diagnosis: Unsteadiness on feet (R26.81);Pain;Muscle weakness (generalized) (M62.81) Pain - Right/Left: Right Pain - part of body: Knee   Activity Tolerance Patient tolerated treatment well   Patient Left in chair;with call bell/phone within reach   Nurse Communication Mobility status        Time:  1347-1401 OT Time Calculation (min): 14 min  Charges: OT General Charges $OT Visit: 1 Visit OT Treatments $Therapeutic Activity: 8-22 mins  Lou Cal, OT Pager [phone] 11/10/2017    Raymondo Band 11/10/2017, 3:50 PM

## 2017-11-11 DIAGNOSIS — Z955 Presence of coronary angioplasty implant and graft: Secondary | ICD-10-CM | POA: Diagnosis not present

## 2017-11-11 DIAGNOSIS — J9610 Chronic respiratory failure, unspecified whether with hypoxia or hypercapnia: Secondary | ICD-10-CM | POA: Diagnosis not present

## 2017-11-11 DIAGNOSIS — M6281 Muscle weakness (generalized): Secondary | ICD-10-CM | POA: Diagnosis not present

## 2017-11-11 DIAGNOSIS — S82141D Displaced bicondylar fracture of right tibia, subsequent encounter for closed fracture with routine healing: Secondary | ICD-10-CM | POA: Diagnosis not present

## 2017-11-11 DIAGNOSIS — I10 Essential (primary) hypertension: Secondary | ICD-10-CM | POA: Diagnosis not present

## 2017-11-11 DIAGNOSIS — M25561 Pain in right knee: Secondary | ICD-10-CM | POA: Diagnosis not present

## 2017-11-11 DIAGNOSIS — S82121D Displaced fracture of lateral condyle of right tibia, subsequent encounter for closed fracture with routine healing: Secondary | ICD-10-CM | POA: Diagnosis not present

## 2017-11-11 DIAGNOSIS — K5904 Chronic idiopathic constipation: Secondary | ICD-10-CM | POA: Diagnosis not present

## 2017-11-11 DIAGNOSIS — I441 Atrioventricular block, second degree: Secondary | ICD-10-CM | POA: Diagnosis not present

## 2017-11-11 DIAGNOSIS — J449 Chronic obstructive pulmonary disease, unspecified: Secondary | ICD-10-CM | POA: Diagnosis not present

## 2017-11-11 DIAGNOSIS — K219 Gastro-esophageal reflux disease without esophagitis: Secondary | ICD-10-CM | POA: Diagnosis not present

## 2017-11-11 DIAGNOSIS — J9690 Respiratory failure, unspecified, unspecified whether with hypoxia or hypercapnia: Secondary | ICD-10-CM | POA: Diagnosis not present

## 2017-11-11 DIAGNOSIS — G4733 Obstructive sleep apnea (adult) (pediatric): Secondary | ICD-10-CM | POA: Diagnosis not present

## 2017-11-11 DIAGNOSIS — Z743 Need for continuous supervision: Secondary | ICD-10-CM | POA: Diagnosis not present

## 2017-11-11 DIAGNOSIS — N183 Chronic kidney disease, stage 3 (moderate): Secondary | ICD-10-CM | POA: Diagnosis not present

## 2017-11-11 DIAGNOSIS — R279 Unspecified lack of coordination: Secondary | ICD-10-CM | POA: Diagnosis not present

## 2017-11-11 DIAGNOSIS — I2510 Atherosclerotic heart disease of native coronary artery without angina pectoris: Secondary | ICD-10-CM | POA: Diagnosis not present

## 2017-11-11 DIAGNOSIS — S82131A Displaced fracture of medial condyle of right tibia, initial encounter for closed fracture: Secondary | ICD-10-CM | POA: Diagnosis not present

## 2017-11-11 DIAGNOSIS — E669 Obesity, unspecified: Secondary | ICD-10-CM | POA: Diagnosis not present

## 2017-11-11 MED ORDER — BISACODYL 5 MG PO TBEC: 10.0000 mg | DELAYED_RELEASE_TABLET | Freq: Every day | ORAL | 1 refills | Status: DC

## 2017-11-11 NOTE — Social Work (Addendum)
With support of RN Case Manager, pt was referred to [place] program.  Pt meets criteria as a [place] pt who is discharging to [facility].   Continue to await for insurance authorization.   3:04pm- Pt insurance authorization has been received. Pt able to discharge to [facility].   Alexander Mt, [place] Work [phone]

## 2017-11-11 NOTE — Progress Notes (Signed)
Report called to [facility] at [place]. Patient aware of discharge and awaiting transport via [place]. [facility] has been notified will continue to monitor.

## 2017-11-11 NOTE — Social Work (Signed)
Clinical Social Worker facilitated patient discharge including contacting patient family and facility to confirm patient discharge plans.  Clinical information faxed to facility and family agreeable with plan.  CSW arranged ambulance transport via [facility] to [facility] room 101.   RN to call [phone] with report  prior to discharge.  Clinical Social Worker will sign off for now as social work intervention is no longer needed. Please consult [place] again if new need arises.  Alexander Mt, [place] Social Worker

## 2017-11-11 NOTE — Progress Notes (Addendum)
Physical Therapy Treatment Patient Details Name: [name] MRN: [id number] DOB: 1945/10/13 Today's Date: 11/11/2017    History of Present Illness Pt is a 72 year old man admitted after a fall on 11/01/17 resulting in R tibia fx. s/p ORIF 11/05/17. PMH: obesity, chronic respiratory failure, OSA on bipap, CAD with stents, HTN, asthma, CKD, anxiety, gout.    PT Comments    Patient received in recliner. Transfer sit-to-stand and stand pivot transfers with minimal assistance to maintain NWB on Rt LE. Patient able to hop/step-to to ambulated 5 feet x2 with RW from recliner to bed and return. Performed lateral scoots Rt and Lt with min guard for Rt LE NWB.  Patient would benefit from +2 treatment next session to ambulate further with chair to follow for safety and reassurance to patient. Patient would continue to benefit from skilled physical therapy in current environment and next venue to continue return to prior function and increase strength, endurance, balance, coordination, and functional mobility and gait skills.    Follow Up Recommendations  SNF;Supervision/Assistance - 24 hour     Equipment Recommendations  None recommended by PT    Recommendations for Other Services       Precautions / Restrictions Precautions Precautions: Fall Required Braces or Orthoses: Other Brace/Splint Other Brace/Splint: hinged knee brace in unlocked position Restrictions Weight Bearing Restrictions: Yes RLE Weight Bearing: Non weight bearing    Mobility  Bed Mobility               General bed mobility comments: pt received in chair  Transfers Overall transfer level: Needs assistance Equipment used: Rolling walker (2 wheeled) Transfers: Sit to/from [facility];Lateral/Scoot Transfers Sit to Stand: Min assist Stand pivot transfers: Min assist      Lateral/Scoot Transfers: Min guard General transfer comment: pt stood from recliner 2x, min A for power up and fwd wt shift  with support as he transferred hands from chair to RW  Ambulation/Gait Ambulation/Gait assistance: Min assist Ambulation Distance (Feet): 10 Feet(5 ft x 2) Assistive device: Rolling walker (2 wheeled) Gait Pattern/deviations: Step-to pattern Gait velocity: decreased   General Gait Details: pt able to take very small hops on LLE and keep RLE NWB, could not tolerate further distance due to SOB, fatigue, and fearful of falling; would benefit from following +2 with chair for safety   Stairs             Wheelchair Mobility    Modified Rankin (Stroke Patients Only)       Balance Overall balance assessment: Needs assistance Sitting-balance support: Feet supported Sitting balance-Leahy Scale: Good     Standing balance support: During functional activity;Bilateral upper extremity supported Standing balance-Leahy Scale: Poor                              Cognition Arousal/Alertness: Awake/alert Behavior During Therapy: WFL for tasks assessed/performed Overall Cognitive Status: Within Functional Limits for tasks assessed                                        Exercises General Exercises - Lower Extremity Ankle Circles/Pumps: AROM;Both;10 reps;Seated Quad Sets: AROM;Both;10 reps;Seated Long Arc Quad: Right;10 reps;Seated;AAROM Hip Flexion/Marching: Strengthening;Both;10 reps;Seated Other Exercises Other Exercises: green theraband in room; performed ankle pf, df, inversion, and eversion against resistance with assist from therapist to hold band at different positions.  10x each plane    General Comments        Pertinent Vitals/Pain Pain Assessment: 0-10 Pain Score: 5  Pain Location: R knee  Pain Intervention(s): Monitored during session;Limited activity within patient's tolerance;Repositioned    Home Living                      Prior Function            PT Goals (current goals can now be found in the care plan section)  Progress towards PT goals: Progressing toward goals    Frequency    Min 3X/week      PT Plan Current plan remains appropriate    Co-evaluation              AM-PAC PT "6 Clicks" Daily Activity  Outcome Measure  Difficulty turning over in bed (including adjusting bedclothes, sheets and blankets)?: A Lot Difficulty moving from lying on back to sitting on the side of the bed? : A Lot Difficulty sitting down on and standing up from a chair with arms (e.g., wheelchair, bedside commode, etc,.)?: A Lot Help needed moving to and from a bed to chair (including a wheelchair)?: A Lot Help needed walking in hospital room?: A Lot Help needed climbing 3-5 steps with a railing? : Total 6 Click Score: 11    End of Session Equipment Utilized During Treatment: Gait belt;Other (comment)(hinged knee brace) Activity Tolerance: Patient limited by fatigue Patient left: in chair;with call bell/phone within reach Nurse Communication: Mobility status PT Visit Diagnosis: Other abnormalities of gait and mobility (R26.89);Pain Pain - Right/Left: Right Pain - part of body: Leg     Time: 1110-1140 PT Time Calculation (min) (ACUTE ONLY): 30 min  Charges:  $Therapeutic Exercise: 8-22 mins $Therapeutic Activity: 8-22 mins                    G Codes:       Dequavious Harshberger D. Hartnett-Rands, MS, PT Per [place] [phone] 11/11/2017, 11:59 AM

## 2017-11-11 NOTE — Plan of Care (Signed)

## 2017-11-11 NOTE — Progress Notes (Signed)
PROGRESS NOTE    [name]  ZDG:[id number] DOB: 1946/04/15 DOA: 11/01/2017 PCP: Maury Dus, MD  Brief Narrative:72 year old male with history of obesity, chronic respiratory failure with hypoxia, obstructive sleep apnea on BiPAP, coronary artery disease with prior stents, hypertension, hyperlipidemia, chronic second-degree Mobitz 1 AV block, who sustained a mechanical fall at home on the day on admission presents with right tibial plateau fracture. Orthopedic surgery consulted. Pulmonology and cardiology consulted for preoperative clearance. Plans are for patient to undergo operative repair on Thursday 5/23.     Assessment & Plan:   Active Problems:   Closed fracture of lateral portion of right tibial plateau   Closed fracture of medial plateau of right tibia  Right displaced tibial plateau fractureS/P surgery5/23 Essential hypertension-hold cozar and hctzdue to elevated creatinine. -Continue Norvasc,add hydralazine  Severe obstructive sleep apnea  continue BiPAP at night  Chronic respiratory failure with hypoxia -Oxygen as needed  Coronary artery disease status post stenting in the paststable   Second-degree Mobitz type I AV block -Cardiologyconsulted.no new recommendations.  Chronic constipation-continue bowel regime.  Chronic kidney disease stage III -trending up.hold cozaar,hctz.bmp pending for today. -      DVT prophylaxis:lovenox Code Statusfull Family Communication:none Disposition Plan dc when bed avilable Consultants: ortho,cardiology  Procedures:ORIF of right lateral tibial plateau fracture   Antimicrobials:none  Subjective:no new complaints   Objective: Vitals:   11/10/17 0845 11/10/17 1414 11/10/17 1800 11/11/17 0458  BP:  (!) 121/100 104/71 (!) 158/63  Pulse:  65 66 (!) 55  Resp:  18  16  Temp:  98.4 F (36.9 C)  98.1 F (36.7 C)  TempSrc:  Oral  Oral  SpO2: 97% 97% 98% 93%  Weight:      Height:         Intake/Output Summary (Last 24 hours) at 11/11/2017 0954 Last data filed at 11/11/2017 0752 Gross per 24 hour  Intake 540 ml  Output 1050 ml  Net -510 ml   Filed Weights   11/01/17 1438  Weight: (!) 141.5 kg (312 lb)    Examination:  General exam: Appears calm and comfortable  Respiratory system: Clear to auscultation. Respiratory effort normal. Cardiovascular system: S1 & S2 heard, RRR. No JVD, murmurs, rubs, gallops or clicks. No pedal edema. Gastrointestinal system: Abdomen is nondistended, soft and nontender. No organomegaly or masses felt. Normal bowel sounds heard. Central nervous system: Alert and oriented. No focal neurological deficits. Extremities: Symmetric 5 x 5 power. Skin: No rashes, lesions or ulcers Psychiatry: Judgement and insight appear normal. Mood & affect appropriate.     Data Reviewed: I have personally reviewed following labs and imaging studies  CBC: Recent Labs  Lab 11/05/17 0510 11/06/17 0621 11/07/17 0515 11/08/17 0309 11/09/17 0450  WBC 9.6 12.2* 11.2* 10.3 10.2  HGB 14.6 14.7 14.2 13.6 14.1  HCT 44.8 45.8 43.6 42.7 43.3  MCV 90.1 90.5 90.5 91.2 89.3  PLT 202 217 213 220 595   Basic Metabolic Panel: Recent Labs  Lab 11/05/17 0510 11/06/17 0622 11/07/17 0515 11/08/17 0309 11/09/17 0450  NA 136 134* 137 137 139  K 4.0 4.5 4.5 4.3 4.3  CL 99* 97* 100* 101 103  CO2 28 29 29 28 28   GLUCOSE 111* 117* 110* 107* 107*  BUN 28* 34* 34* 31* 27*  CREATININE 1.48* 1.48* 1.40* 1.39* 1.28*  CALCIUM 10.1  10.3* 9.7 9.7 9.8 9.9   GFR: Estimated Creatinine Clearance: 76 mL/min (A) (by C-G formula based on SCr of 1.28 mg/dL (H)). Liver  Function Tests: Recent Labs  Lab 11/05/17 0510  AST 26  ALT 31  ALKPHOS 50  BILITOT 1.2  PROT 6.4*  ALBUMIN 3.1*   No results for input(s): LIPASE, AMYLASE in the last 168 hours. No results for input(s): AMMONIA in the last 168 hours. Coagulation Profile: Recent Labs  Lab 11/05/17 0510  INR  1.05   Cardiac Enzymes: No results for input(s): CKTOTAL, CKMB, CKMBINDEX, TROPONINI in the last 168 hours. BNP (last 3 results) Recent Labs    03/24/17 1634  PROBNP 190.0*   HbA1C: No results for input(s): HGBA1C in the last 72 hours. CBG: No results for input(s): GLUCAP in the last 168 hours. Lipid Profile: No results for input(s): CHOL, HDL, LDLCALC, TRIG, CHOLHDL, LDLDIRECT in the last 72 hours. Thyroid Function Tests: No results for input(s): TSH, T4TOTAL, FREET4, T3FREE, THYROIDAB in the last 72 hours. Anemia Panel: No results for input(s): VITAMINB12, FOLATE, FERRITIN, TIBC, IRON, RETICCTPCT in the last 72 hours. Sepsis Labs: No results for input(s): PROCALCITON, LATICACIDVEN in the last 168 hours.  Recent Results (from the past 240 hour(s))  Surgical pcr screen     Status: None   Collection Time: 11/03/17 11:23 AM  Result Value Ref Range Status   MRSA, PCR NEGATIVE NEGATIVE Final   Staphylococcus aureus NEGATIVE NEGATIVE Final    Comment: (NOTE) The Xpert SA Assay (FDA approved for NASAL specimens in patients 59 years of age and older), is one component of a comprehensive surveillance program. It is not intended to diagnose infection nor to guide or monitor treatment. Performed at [facility], [place] [address] [place], [place] [address]          Radiology Studies: No results found.      Scheduled Meds: . allopurinol  300 mg Oral Daily  . amLODipine  10 mg Oral Daily  . arformoterol  15 mcg Nebulization BID  . aspirin EC  81 mg Oral Daily  . budesonide (PULMICORT) nebulizer solution  0.25 mg Nebulization BID  . cholecalciferol  2,000 Units Oral BID  . enoxaparin (LOVENOX) injection  40 mg Subcutaneous Q24H  . hydrALAZINE  50 mg Oral Q8H  . montelukast  10 mg Oral QHS  . pantoprazole  40 mg Oral Daily  . polyethylene glycol  17 g Oral BID  . predniSONE  30 mg Oral Once  . senna  2 tablet Oral Daily  . vitamin C  500 mg Oral Daily    Continuous Infusions: . lactated ringers Stopped (11/08/17 0855)     LOS: 10 days     Georgette Shell, MD [facility]  If 7PM-7AM, please contact night-coverage [email or web address] Password [facility] 11/11/2017, 9:54 AM

## 2017-11-11 NOTE — Clinical Social Work Placement (Signed)
   Stella RN to call report to [phone]   Date:  11/11/2017  Patient Details  Name: [name] MRN: [id number] Date of Birth: 07-31-1945  Clinical Social Work is seeking post-discharge placement for this patient at the [place] level of care (*CSW will initial, date and re-position this form in  chart as items are completed):  Yes   Patient/family provided with [place] Work Department's list of facilities offering this level of care within the geographic area requested by the patient (or if unable, by the patient's family).  Yes   Patient/family informed of their freedom to choose among providers that offer the needed level of care, that participate in Medicare, Medicaid or managed care program needed by the patient, have an available bed and are willing to accept the patient.  Yes   Patient/family informed of [place]'s ownership interest in [facility] and [facility], as well as of the fact that they are under no obligation to receive care at these facilities.  PASRR submitted to EDS on       PASRR number received on 11/06/17     Existing PASRR number confirmed on       FL2 transmitted to all facilities in geographic area requested by pt/family on 11/06/17     FL2 transmitted to all facilities within larger geographic area on       Patient informed that his/her managed care company has contracts with or will negotiate with certain facilities, including the following:        Yes   Patient/family informed of bed offers received.  Patient chooses bed at [facility]     Physician recommends and patient chooses bed at      Patient to be transferred to [facility] on 11/11/17.  Patient to be transferred to facility by [facility]     Patient family notified on 11/11/17 of transfer.  Name of family member notified:  Terrence Dupont, pt wife (pt called)      PHYSICIAN Please prepare priority discharge summary, including medications, Please prepare prescriptions     Additional Comment:    _______________________________________________ Alexander Mt, [place] 11/11/2017, 3:12 PM

## 2017-11-13 DIAGNOSIS — J449 Chronic obstructive pulmonary disease, unspecified: Secondary | ICD-10-CM | POA: Diagnosis not present

## 2017-11-13 DIAGNOSIS — G4733 Obstructive sleep apnea (adult) (pediatric): Secondary | ICD-10-CM | POA: Diagnosis not present

## 2017-11-13 DIAGNOSIS — I2510 Atherosclerotic heart disease of native coronary artery without angina pectoris: Secondary | ICD-10-CM | POA: Diagnosis not present

## 2017-11-13 DIAGNOSIS — S82141D Displaced bicondylar fracture of right tibia, subsequent encounter for closed fracture with routine healing: Secondary | ICD-10-CM | POA: Diagnosis not present

## 2017-11-24 DIAGNOSIS — S82121D Displaced fracture of lateral condyle of right tibia, subsequent encounter for closed fracture with routine healing: Secondary | ICD-10-CM | POA: Diagnosis not present

## 2017-11-28 DIAGNOSIS — K5909 Other constipation: Secondary | ICD-10-CM | POA: Diagnosis not present

## 2017-11-28 DIAGNOSIS — S82141D Displaced bicondylar fracture of right tibia, subsequent encounter for closed fracture with routine healing: Secondary | ICD-10-CM | POA: Diagnosis not present

## 2017-11-28 DIAGNOSIS — I2510 Atherosclerotic heart disease of native coronary artery without angina pectoris: Secondary | ICD-10-CM | POA: Diagnosis not present

## 2017-11-28 DIAGNOSIS — I1310 Hypertensive heart and chronic kidney disease without heart failure, with stage 1 through stage 4 chronic kidney disease, or unspecified chronic kidney disease: Secondary | ICD-10-CM | POA: Diagnosis not present

## 2017-11-28 DIAGNOSIS — N183 Chronic kidney disease, stage 3 (moderate): Secondary | ICD-10-CM | POA: Diagnosis not present

## 2017-11-28 DIAGNOSIS — E669 Obesity, unspecified: Secondary | ICD-10-CM | POA: Diagnosis not present

## 2017-11-28 DIAGNOSIS — I441 Atrioventricular block, second degree: Secondary | ICD-10-CM | POA: Diagnosis not present

## 2017-11-28 DIAGNOSIS — E785 Hyperlipidemia, unspecified: Secondary | ICD-10-CM | POA: Diagnosis not present

## 2017-11-28 DIAGNOSIS — J9611 Chronic respiratory failure with hypoxia: Secondary | ICD-10-CM | POA: Diagnosis not present

## 2017-11-28 DIAGNOSIS — J449 Chronic obstructive pulmonary disease, unspecified: Secondary | ICD-10-CM | POA: Diagnosis not present

## 2017-11-28 DIAGNOSIS — G4733 Obstructive sleep apnea (adult) (pediatric): Secondary | ICD-10-CM | POA: Diagnosis not present

## 2017-11-28 DIAGNOSIS — J9690 Respiratory failure, unspecified, unspecified whether with hypoxia or hypercapnia: Secondary | ICD-10-CM | POA: Diagnosis not present

## 2017-12-01 DIAGNOSIS — S82141D Displaced bicondylar fracture of right tibia, subsequent encounter for closed fracture with routine healing: Secondary | ICD-10-CM | POA: Diagnosis not present

## 2017-12-01 DIAGNOSIS — N183 Chronic kidney disease, stage 3 (moderate): Secondary | ICD-10-CM | POA: Diagnosis not present

## 2017-12-01 DIAGNOSIS — I441 Atrioventricular block, second degree: Secondary | ICD-10-CM | POA: Diagnosis not present

## 2017-12-01 DIAGNOSIS — I2510 Atherosclerotic heart disease of native coronary artery without angina pectoris: Secondary | ICD-10-CM | POA: Diagnosis not present

## 2017-12-01 DIAGNOSIS — J9611 Chronic respiratory failure with hypoxia: Secondary | ICD-10-CM | POA: Diagnosis not present

## 2017-12-01 DIAGNOSIS — J449 Chronic obstructive pulmonary disease, unspecified: Secondary | ICD-10-CM | POA: Diagnosis not present

## 2017-12-01 DIAGNOSIS — K5909 Other constipation: Secondary | ICD-10-CM | POA: Diagnosis not present

## 2017-12-01 DIAGNOSIS — G4733 Obstructive sleep apnea (adult) (pediatric): Secondary | ICD-10-CM | POA: Diagnosis not present

## 2017-12-01 DIAGNOSIS — I1310 Hypertensive heart and chronic kidney disease without heart failure, with stage 1 through stage 4 chronic kidney disease, or unspecified chronic kidney disease: Secondary | ICD-10-CM | POA: Diagnosis not present

## 2017-12-03 DIAGNOSIS — J449 Chronic obstructive pulmonary disease, unspecified: Secondary | ICD-10-CM | POA: Diagnosis not present

## 2017-12-03 DIAGNOSIS — I1310 Hypertensive heart and chronic kidney disease without heart failure, with stage 1 through stage 4 chronic kidney disease, or unspecified chronic kidney disease: Secondary | ICD-10-CM | POA: Diagnosis not present

## 2017-12-03 DIAGNOSIS — I2510 Atherosclerotic heart disease of native coronary artery without angina pectoris: Secondary | ICD-10-CM | POA: Diagnosis not present

## 2017-12-03 DIAGNOSIS — S82141D Displaced bicondylar fracture of right tibia, subsequent encounter for closed fracture with routine healing: Secondary | ICD-10-CM | POA: Diagnosis not present

## 2017-12-03 DIAGNOSIS — K5909 Other constipation: Secondary | ICD-10-CM | POA: Diagnosis not present

## 2017-12-03 DIAGNOSIS — G4733 Obstructive sleep apnea (adult) (pediatric): Secondary | ICD-10-CM | POA: Diagnosis not present

## 2017-12-03 DIAGNOSIS — N183 Chronic kidney disease, stage 3 (moderate): Secondary | ICD-10-CM | POA: Diagnosis not present

## 2017-12-03 DIAGNOSIS — J9611 Chronic respiratory failure with hypoxia: Secondary | ICD-10-CM | POA: Diagnosis not present

## 2017-12-03 DIAGNOSIS — I441 Atrioventricular block, second degree: Secondary | ICD-10-CM | POA: Diagnosis not present

## 2017-12-04 DIAGNOSIS — K5909 Other constipation: Secondary | ICD-10-CM | POA: Diagnosis not present

## 2017-12-04 DIAGNOSIS — G4733 Obstructive sleep apnea (adult) (pediatric): Secondary | ICD-10-CM | POA: Diagnosis not present

## 2017-12-04 DIAGNOSIS — J9611 Chronic respiratory failure with hypoxia: Secondary | ICD-10-CM | POA: Diagnosis not present

## 2017-12-04 DIAGNOSIS — Z09 Encounter for follow-up examination after completed treatment for conditions other than malignant neoplasm: Secondary | ICD-10-CM | POA: Diagnosis not present

## 2017-12-04 DIAGNOSIS — N183 Chronic kidney disease, stage 3 (moderate): Secondary | ICD-10-CM | POA: Diagnosis not present

## 2017-12-04 DIAGNOSIS — J449 Chronic obstructive pulmonary disease, unspecified: Secondary | ICD-10-CM | POA: Diagnosis not present

## 2017-12-04 DIAGNOSIS — I2510 Atherosclerotic heart disease of native coronary artery without angina pectoris: Secondary | ICD-10-CM | POA: Diagnosis not present

## 2017-12-04 DIAGNOSIS — S82141D Displaced bicondylar fracture of right tibia, subsequent encounter for closed fracture with routine healing: Secondary | ICD-10-CM | POA: Diagnosis not present

## 2017-12-04 DIAGNOSIS — S8291XD Unspecified fracture of right lower leg, subsequent encounter for closed fracture with routine healing: Secondary | ICD-10-CM | POA: Diagnosis not present

## 2017-12-04 DIAGNOSIS — I441 Atrioventricular block, second degree: Secondary | ICD-10-CM | POA: Diagnosis not present

## 2017-12-04 DIAGNOSIS — I1310 Hypertensive heart and chronic kidney disease without heart failure, with stage 1 through stage 4 chronic kidney disease, or unspecified chronic kidney disease: Secondary | ICD-10-CM | POA: Diagnosis not present

## 2017-12-04 DIAGNOSIS — F418 Other specified anxiety disorders: Secondary | ICD-10-CM | POA: Diagnosis not present

## 2017-12-07 DIAGNOSIS — I1310 Hypertensive heart and chronic kidney disease without heart failure, with stage 1 through stage 4 chronic kidney disease, or unspecified chronic kidney disease: Secondary | ICD-10-CM | POA: Diagnosis not present

## 2017-12-07 DIAGNOSIS — K5909 Other constipation: Secondary | ICD-10-CM | POA: Diagnosis not present

## 2017-12-07 DIAGNOSIS — I2510 Atherosclerotic heart disease of native coronary artery without angina pectoris: Secondary | ICD-10-CM | POA: Diagnosis not present

## 2017-12-07 DIAGNOSIS — N183 Chronic kidney disease, stage 3 (moderate): Secondary | ICD-10-CM | POA: Diagnosis not present

## 2017-12-07 DIAGNOSIS — J449 Chronic obstructive pulmonary disease, unspecified: Secondary | ICD-10-CM | POA: Diagnosis not present

## 2017-12-07 DIAGNOSIS — S82141D Displaced bicondylar fracture of right tibia, subsequent encounter for closed fracture with routine healing: Secondary | ICD-10-CM | POA: Diagnosis not present

## 2017-12-07 DIAGNOSIS — J9611 Chronic respiratory failure with hypoxia: Secondary | ICD-10-CM | POA: Diagnosis not present

## 2017-12-07 DIAGNOSIS — G4733 Obstructive sleep apnea (adult) (pediatric): Secondary | ICD-10-CM | POA: Diagnosis not present

## 2017-12-07 DIAGNOSIS — I441 Atrioventricular block, second degree: Secondary | ICD-10-CM | POA: Diagnosis not present

## 2017-12-09 DIAGNOSIS — G4733 Obstructive sleep apnea (adult) (pediatric): Secondary | ICD-10-CM | POA: Diagnosis not present

## 2017-12-09 DIAGNOSIS — I2510 Atherosclerotic heart disease of native coronary artery without angina pectoris: Secondary | ICD-10-CM | POA: Diagnosis not present

## 2017-12-09 DIAGNOSIS — K5909 Other constipation: Secondary | ICD-10-CM | POA: Diagnosis not present

## 2017-12-09 DIAGNOSIS — S82141D Displaced bicondylar fracture of right tibia, subsequent encounter for closed fracture with routine healing: Secondary | ICD-10-CM | POA: Diagnosis not present

## 2017-12-09 DIAGNOSIS — J9611 Chronic respiratory failure with hypoxia: Secondary | ICD-10-CM | POA: Diagnosis not present

## 2017-12-09 DIAGNOSIS — J449 Chronic obstructive pulmonary disease, unspecified: Secondary | ICD-10-CM | POA: Diagnosis not present

## 2017-12-09 DIAGNOSIS — N183 Chronic kidney disease, stage 3 (moderate): Secondary | ICD-10-CM | POA: Diagnosis not present

## 2017-12-09 DIAGNOSIS — I441 Atrioventricular block, second degree: Secondary | ICD-10-CM | POA: Diagnosis not present

## 2017-12-09 DIAGNOSIS — I1310 Hypertensive heart and chronic kidney disease without heart failure, with stage 1 through stage 4 chronic kidney disease, or unspecified chronic kidney disease: Secondary | ICD-10-CM | POA: Diagnosis not present

## 2017-12-14 DIAGNOSIS — N183 Chronic kidney disease, stage 3 (moderate): Secondary | ICD-10-CM | POA: Diagnosis not present

## 2017-12-14 DIAGNOSIS — K5909 Other constipation: Secondary | ICD-10-CM | POA: Diagnosis not present

## 2017-12-14 DIAGNOSIS — I441 Atrioventricular block, second degree: Secondary | ICD-10-CM | POA: Diagnosis not present

## 2017-12-14 DIAGNOSIS — J9611 Chronic respiratory failure with hypoxia: Secondary | ICD-10-CM | POA: Diagnosis not present

## 2017-12-14 DIAGNOSIS — I2510 Atherosclerotic heart disease of native coronary artery without angina pectoris: Secondary | ICD-10-CM | POA: Diagnosis not present

## 2017-12-14 DIAGNOSIS — S82141D Displaced bicondylar fracture of right tibia, subsequent encounter for closed fracture with routine healing: Secondary | ICD-10-CM | POA: Diagnosis not present

## 2017-12-14 DIAGNOSIS — J449 Chronic obstructive pulmonary disease, unspecified: Secondary | ICD-10-CM | POA: Diagnosis not present

## 2017-12-14 DIAGNOSIS — I1310 Hypertensive heart and chronic kidney disease without heart failure, with stage 1 through stage 4 chronic kidney disease, or unspecified chronic kidney disease: Secondary | ICD-10-CM | POA: Diagnosis not present

## 2017-12-14 DIAGNOSIS — G4733 Obstructive sleep apnea (adult) (pediatric): Secondary | ICD-10-CM | POA: Diagnosis not present

## 2017-12-15 DIAGNOSIS — G4733 Obstructive sleep apnea (adult) (pediatric): Secondary | ICD-10-CM | POA: Diagnosis not present

## 2017-12-16 DIAGNOSIS — I1310 Hypertensive heart and chronic kidney disease without heart failure, with stage 1 through stage 4 chronic kidney disease, or unspecified chronic kidney disease: Secondary | ICD-10-CM | POA: Diagnosis not present

## 2017-12-16 DIAGNOSIS — I441 Atrioventricular block, second degree: Secondary | ICD-10-CM | POA: Diagnosis not present

## 2017-12-16 DIAGNOSIS — N183 Chronic kidney disease, stage 3 (moderate): Secondary | ICD-10-CM | POA: Diagnosis not present

## 2017-12-16 DIAGNOSIS — I2510 Atherosclerotic heart disease of native coronary artery without angina pectoris: Secondary | ICD-10-CM | POA: Diagnosis not present

## 2017-12-16 DIAGNOSIS — K5909 Other constipation: Secondary | ICD-10-CM | POA: Diagnosis not present

## 2017-12-16 DIAGNOSIS — J449 Chronic obstructive pulmonary disease, unspecified: Secondary | ICD-10-CM | POA: Diagnosis not present

## 2017-12-16 DIAGNOSIS — G4733 Obstructive sleep apnea (adult) (pediatric): Secondary | ICD-10-CM | POA: Diagnosis not present

## 2017-12-16 DIAGNOSIS — J9611 Chronic respiratory failure with hypoxia: Secondary | ICD-10-CM | POA: Diagnosis not present

## 2017-12-16 DIAGNOSIS — S82141D Displaced bicondylar fracture of right tibia, subsequent encounter for closed fracture with routine healing: Secondary | ICD-10-CM | POA: Diagnosis not present

## 2017-12-22 DIAGNOSIS — K5909 Other constipation: Secondary | ICD-10-CM | POA: Diagnosis not present

## 2017-12-22 DIAGNOSIS — I1310 Hypertensive heart and chronic kidney disease without heart failure, with stage 1 through stage 4 chronic kidney disease, or unspecified chronic kidney disease: Secondary | ICD-10-CM | POA: Diagnosis not present

## 2017-12-22 DIAGNOSIS — I441 Atrioventricular block, second degree: Secondary | ICD-10-CM | POA: Diagnosis not present

## 2017-12-22 DIAGNOSIS — J9611 Chronic respiratory failure with hypoxia: Secondary | ICD-10-CM | POA: Diagnosis not present

## 2017-12-22 DIAGNOSIS — S82141D Displaced bicondylar fracture of right tibia, subsequent encounter for closed fracture with routine healing: Secondary | ICD-10-CM | POA: Diagnosis not present

## 2017-12-22 DIAGNOSIS — I2510 Atherosclerotic heart disease of native coronary artery without angina pectoris: Secondary | ICD-10-CM | POA: Diagnosis not present

## 2017-12-22 DIAGNOSIS — J449 Chronic obstructive pulmonary disease, unspecified: Secondary | ICD-10-CM | POA: Diagnosis not present

## 2017-12-22 DIAGNOSIS — N183 Chronic kidney disease, stage 3 (moderate): Secondary | ICD-10-CM | POA: Diagnosis not present

## 2017-12-22 DIAGNOSIS — S82121D Displaced fracture of lateral condyle of right tibia, subsequent encounter for closed fracture with routine healing: Secondary | ICD-10-CM | POA: Diagnosis not present

## 2017-12-22 DIAGNOSIS — G4733 Obstructive sleep apnea (adult) (pediatric): Secondary | ICD-10-CM | POA: Diagnosis not present

## 2017-12-26 DIAGNOSIS — J9690 Respiratory failure, unspecified, unspecified whether with hypoxia or hypercapnia: Secondary | ICD-10-CM | POA: Diagnosis not present

## 2017-12-26 DIAGNOSIS — J449 Chronic obstructive pulmonary disease, unspecified: Secondary | ICD-10-CM | POA: Diagnosis not present

## 2017-12-26 DIAGNOSIS — G4733 Obstructive sleep apnea (adult) (pediatric): Secondary | ICD-10-CM | POA: Diagnosis not present

## 2017-12-26 DIAGNOSIS — E669 Obesity, unspecified: Secondary | ICD-10-CM | POA: Diagnosis not present

## 2017-12-29 DIAGNOSIS — N183 Chronic kidney disease, stage 3 (moderate): Secondary | ICD-10-CM | POA: Diagnosis not present

## 2017-12-29 DIAGNOSIS — G4733 Obstructive sleep apnea (adult) (pediatric): Secondary | ICD-10-CM | POA: Diagnosis not present

## 2017-12-29 DIAGNOSIS — S82141D Displaced bicondylar fracture of right tibia, subsequent encounter for closed fracture with routine healing: Secondary | ICD-10-CM | POA: Diagnosis not present

## 2017-12-29 DIAGNOSIS — J449 Chronic obstructive pulmonary disease, unspecified: Secondary | ICD-10-CM | POA: Diagnosis not present

## 2017-12-29 DIAGNOSIS — J9611 Chronic respiratory failure with hypoxia: Secondary | ICD-10-CM | POA: Diagnosis not present

## 2017-12-29 DIAGNOSIS — I1310 Hypertensive heart and chronic kidney disease without heart failure, with stage 1 through stage 4 chronic kidney disease, or unspecified chronic kidney disease: Secondary | ICD-10-CM | POA: Diagnosis not present

## 2017-12-29 DIAGNOSIS — I2510 Atherosclerotic heart disease of native coronary artery without angina pectoris: Secondary | ICD-10-CM | POA: Diagnosis not present

## 2017-12-29 DIAGNOSIS — I441 Atrioventricular block, second degree: Secondary | ICD-10-CM | POA: Diagnosis not present

## 2017-12-29 DIAGNOSIS — K5909 Other constipation: Secondary | ICD-10-CM | POA: Diagnosis not present

## 2017-12-30 DIAGNOSIS — N400 Enlarged prostate without lower urinary tract symptoms: Secondary | ICD-10-CM | POA: Diagnosis not present

## 2017-12-30 DIAGNOSIS — C61 Malignant neoplasm of prostate: Secondary | ICD-10-CM | POA: Diagnosis not present

## 2018-01-04 DIAGNOSIS — S82101D Unspecified fracture of upper end of right tibia, subsequent encounter for closed fracture with routine healing: Secondary | ICD-10-CM | POA: Diagnosis not present

## 2018-01-04 DIAGNOSIS — M6281 Muscle weakness (generalized): Secondary | ICD-10-CM | POA: Diagnosis not present

## 2018-01-04 DIAGNOSIS — R2689 Other abnormalities of gait and mobility: Secondary | ICD-10-CM | POA: Diagnosis not present

## 2018-01-06 DIAGNOSIS — S82101D Unspecified fracture of upper end of right tibia, subsequent encounter for closed fracture with routine healing: Secondary | ICD-10-CM | POA: Diagnosis not present

## 2018-01-06 DIAGNOSIS — R2689 Other abnormalities of gait and mobility: Secondary | ICD-10-CM | POA: Diagnosis not present

## 2018-01-06 DIAGNOSIS — M6281 Muscle weakness (generalized): Secondary | ICD-10-CM | POA: Diagnosis not present

## 2018-01-08 DIAGNOSIS — M6281 Muscle weakness (generalized): Secondary | ICD-10-CM | POA: Diagnosis not present

## 2018-01-08 DIAGNOSIS — R2689 Other abnormalities of gait and mobility: Secondary | ICD-10-CM | POA: Diagnosis not present

## 2018-01-08 DIAGNOSIS — S82101D Unspecified fracture of upper end of right tibia, subsequent encounter for closed fracture with routine healing: Secondary | ICD-10-CM | POA: Diagnosis not present

## 2018-01-12 DIAGNOSIS — M6281 Muscle weakness (generalized): Secondary | ICD-10-CM | POA: Diagnosis not present

## 2018-01-12 DIAGNOSIS — R2689 Other abnormalities of gait and mobility: Secondary | ICD-10-CM | POA: Diagnosis not present

## 2018-01-12 DIAGNOSIS — S82101D Unspecified fracture of upper end of right tibia, subsequent encounter for closed fracture with routine healing: Secondary | ICD-10-CM | POA: Diagnosis not present

## 2018-01-14 DIAGNOSIS — R2689 Other abnormalities of gait and mobility: Secondary | ICD-10-CM | POA: Diagnosis not present

## 2018-01-14 DIAGNOSIS — S82101D Unspecified fracture of upper end of right tibia, subsequent encounter for closed fracture with routine healing: Secondary | ICD-10-CM | POA: Diagnosis not present

## 2018-01-14 DIAGNOSIS — M6281 Muscle weakness (generalized): Secondary | ICD-10-CM | POA: Diagnosis not present

## 2018-01-15 DIAGNOSIS — G4733 Obstructive sleep apnea (adult) (pediatric): Secondary | ICD-10-CM | POA: Diagnosis not present

## 2018-01-18 DIAGNOSIS — M6281 Muscle weakness (generalized): Secondary | ICD-10-CM | POA: Diagnosis not present

## 2018-01-18 DIAGNOSIS — R2689 Other abnormalities of gait and mobility: Secondary | ICD-10-CM | POA: Diagnosis not present

## 2018-01-18 DIAGNOSIS — S82101D Unspecified fracture of upper end of right tibia, subsequent encounter for closed fracture with routine healing: Secondary | ICD-10-CM | POA: Diagnosis not present

## 2018-01-20 DIAGNOSIS — R2689 Other abnormalities of gait and mobility: Secondary | ICD-10-CM | POA: Diagnosis not present

## 2018-01-20 DIAGNOSIS — S82101D Unspecified fracture of upper end of right tibia, subsequent encounter for closed fracture with routine healing: Secondary | ICD-10-CM | POA: Diagnosis not present

## 2018-01-20 DIAGNOSIS — M6281 Muscle weakness (generalized): Secondary | ICD-10-CM | POA: Diagnosis not present

## 2018-01-22 DIAGNOSIS — S82101D Unspecified fracture of upper end of right tibia, subsequent encounter for closed fracture with routine healing: Secondary | ICD-10-CM | POA: Diagnosis not present

## 2018-01-22 DIAGNOSIS — R2689 Other abnormalities of gait and mobility: Secondary | ICD-10-CM | POA: Diagnosis not present

## 2018-01-22 DIAGNOSIS — M6281 Muscle weakness (generalized): Secondary | ICD-10-CM | POA: Diagnosis not present

## 2018-01-25 DIAGNOSIS — M6281 Muscle weakness (generalized): Secondary | ICD-10-CM | POA: Diagnosis not present

## 2018-01-25 DIAGNOSIS — S82101D Unspecified fracture of upper end of right tibia, subsequent encounter for closed fracture with routine healing: Secondary | ICD-10-CM | POA: Diagnosis not present

## 2018-01-25 DIAGNOSIS — R2689 Other abnormalities of gait and mobility: Secondary | ICD-10-CM | POA: Diagnosis not present

## 2018-01-26 DIAGNOSIS — S82121D Displaced fracture of lateral condyle of right tibia, subsequent encounter for closed fracture with routine healing: Secondary | ICD-10-CM | POA: Diagnosis not present

## 2018-01-26 DIAGNOSIS — J449 Chronic obstructive pulmonary disease, unspecified: Secondary | ICD-10-CM | POA: Diagnosis not present

## 2018-01-26 DIAGNOSIS — G4733 Obstructive sleep apnea (adult) (pediatric): Secondary | ICD-10-CM | POA: Diagnosis not present

## 2018-01-26 DIAGNOSIS — J9690 Respiratory failure, unspecified, unspecified whether with hypoxia or hypercapnia: Secondary | ICD-10-CM | POA: Diagnosis not present

## 2018-01-26 DIAGNOSIS — E669 Obesity, unspecified: Secondary | ICD-10-CM | POA: Diagnosis not present

## 2018-02-15 DIAGNOSIS — G4733 Obstructive sleep apnea (adult) (pediatric): Secondary | ICD-10-CM | POA: Diagnosis not present

## 2018-02-24 DIAGNOSIS — G4733 Obstructive sleep apnea (adult) (pediatric): Secondary | ICD-10-CM | POA: Diagnosis not present

## 2018-02-26 DIAGNOSIS — G4733 Obstructive sleep apnea (adult) (pediatric): Secondary | ICD-10-CM | POA: Diagnosis not present

## 2018-02-26 DIAGNOSIS — J9690 Respiratory failure, unspecified, unspecified whether with hypoxia or hypercapnia: Secondary | ICD-10-CM | POA: Diagnosis not present

## 2018-02-26 DIAGNOSIS — J449 Chronic obstructive pulmonary disease, unspecified: Secondary | ICD-10-CM | POA: Diagnosis not present

## 2018-02-26 DIAGNOSIS — E669 Obesity, unspecified: Secondary | ICD-10-CM | POA: Diagnosis not present

## 2018-03-02 ENCOUNTER — Ambulatory Visit: Payer: Medicare HMO | Admitting: Pulmonary Disease

## 2018-03-17 DIAGNOSIS — G4733 Obstructive sleep apnea (adult) (pediatric): Secondary | ICD-10-CM | POA: Diagnosis not present

## 2018-03-26 ENCOUNTER — Ambulatory Visit: Payer: Medicare HMO | Admitting: Pulmonary Disease

## 2018-03-28 DIAGNOSIS — G4733 Obstructive sleep apnea (adult) (pediatric): Secondary | ICD-10-CM | POA: Diagnosis not present

## 2018-03-28 DIAGNOSIS — J449 Chronic obstructive pulmonary disease, unspecified: Secondary | ICD-10-CM | POA: Diagnosis not present

## 2018-03-28 DIAGNOSIS — E669 Obesity, unspecified: Secondary | ICD-10-CM | POA: Diagnosis not present

## 2018-03-28 DIAGNOSIS — J9690 Respiratory failure, unspecified, unspecified whether with hypoxia or hypercapnia: Secondary | ICD-10-CM | POA: Diagnosis not present

## 2018-04-06 DIAGNOSIS — Z23 Encounter for immunization: Secondary | ICD-10-CM | POA: Diagnosis not present

## 2018-04-17 DIAGNOSIS — G4733 Obstructive sleep apnea (adult) (pediatric): Secondary | ICD-10-CM | POA: Diagnosis not present

## 2018-04-19 ENCOUNTER — Ambulatory Visit: Payer: Medicare HMO | Admitting: Internal Medicine

## 2018-04-19 ENCOUNTER — Encounter: Payer: Self-pay | Admitting: Internal Medicine

## 2018-04-19 VITALS — BP 130/78 | HR 61 | Ht 73.0 in | Wt 318.0 lb

## 2018-04-19 DIAGNOSIS — J449 Chronic obstructive pulmonary disease, unspecified: Secondary | ICD-10-CM

## 2018-04-19 MED ORDER — ALBUTEROL SULFATE (2.5 MG/3ML) 0.083% IN NEBU: 2.5000 mg | INHALATION_SOLUTION | Freq: Four times a day (QID) | RESPIRATORY_TRACT | 12 refills | Status: DC | PRN

## 2018-04-19 NOTE — Progress Notes (Signed)
72 yo male former smoker followed for severe OSA on BIPAP At bedtime   PMH of CAD, HTN  Tests PSG 04/07/14 >> AHI 97 BiPAP 04/26/15 to 2/07/1 # dyspnea due to obesity (copd ruled out) and   #- LLL 6 mmm pulmonary nodule since Jan 2011 in morbidly obese patient,   # OSA - intolerant to cPAP  #  sp bilateral LL pneumonia Oct 2010 and   # ex smoker.   OV 11/11/2011  Last seen Nov 2011. Then did not followup. Overal well. Dyspnea improved. NO COPD but did CAT score and result is 7. Has mild-mod cough; noted to be on ace inhibitor. Has not had CT for his lung nodule. Has sleep apnea and is not interested in cpaPast, Family, Social reviewed: no change since last visit7 >> used on 46 of 90 nights with average 11 hrs 4 min.  Average AHI 9.8 with BiPAP 12/8 cm H2O  03/24/2017 Follow up ; OSA on BIPAP ,Dyspnea  Patient returns for a follow-up. Patient has known severe sleep apnea and is on BiPAP at bedtime. He was last seen in February 2017. Patient admits that he has not been wearing his BiPAP. He says he's tried aware to couple times but just does not feel that is comfortable. We discussed his severe sleep apnea and need to wear his BiPAP. Discussed potential complications of untreated sleep apnea. Encouraged him on compliance.  Patient complains of shortness of breath that has been chronic and feels that his begin getting worse over the last several months. Over the last 3-4 weeks. Symptoms have been getting more and more pronounced. Patient was seen recently at his gastroenterologist for a screening colonoscopy and due to his dyspnea was recommended to see pulmonology for evaluation prior to getting a colonoscopy. Patient says he has intermittent dry cough and occasional wheezing. Patient was seen in 2013 by Dr. Chase Caller for shortness of breath. During that evaluation, he was felt not to have COPD. Was felt that his dyspnea was related to obesity. Pt says he gets winded with walking . Wears out  of breath. Unable to do yard work. No chest pain, calf pain or hemoptysis . Does have chronic ankle edema. No flare of swelling . On HCTZ daily. He is followed by Cardiology for CAD s/p Stents . Says he was seen last week by cards . No records available. FENO testing today 7ppb   No Known Allergies  OV 04/08/2017  Chief Complaint  Patient presents with  . Follow-up    PFT done today. Pt c/o SOB that comes and goes and dry cough. Denies any CP.    72 year old male that I personally have not seen in 5 years.At that time he had shortness of breath that labeled as down to obesity and physical deconditioning and associated diastolic dysfunction. In the interim he has been following up with Dr. Halford Chessman for sleep apnea. He recently saw my nurse for additional colonoscopy clearance and at that time he reported that his shortness of breath is significantly worsen the last 5 years. It is present for minimal exertion. He is also reporting choking episodes at night despite using his CPAP. She also pulmonary function test which I reviewed shows restriction with significant bronchodilator response and a slightly low DLCO 68%. He also reports some wheezing with exhaled nitric oxide was normal. He is a previous 105 pack smoker   05/06/2017 Follow up: Dyspnea/COPD/Asthma /OSA  Pt returns for 1 month follow up . He was  seen last office visit with dyspnea that has been getting worse over last 6-12 month. Had some mild intermittent dry cough and wheezing. PFT  Severe restriction with significant BD response. DLCO was decreased. He was set up for a HRCT chest that was neg for ILD. Showed linear scarring in lower lobes. Echo showed EF 60-65%. Mod LA dilation . PAP 36 mmHg. He was started on BREO . He says he might have some improvement in dyspnea but still gets winded with walking and bending over to put on shoes.  Denies chest pain , orthopnea , increased edema.  He has chronic ankle edema , takes HCTZ daily.   Has  very severe OSA but still not wearing BIPAP . We discussed importance of BIPAP compliance. Says he feels mask is blowing to hard on him.  Pt education on OSA .   OV 10/19/2017  Chief Complaint  Patient presents with  . Follow-up    Pt states he has been doing good since last visit. Pt states he still has SOB. Denies any cough or CP.   Follow-up multifactorial dyspnea secondary to pulmonary function test suggestive of emphysema with isolated reduction diffusion capacity, morbid obesity, diastolic dysfunction and sleep apnea with some cor pulmonale features such as chronic edema and mildly elevated pulmonary artery systolic pressure on echo  He is now compliant with his CPAP at night/BiPAP.  This is significantly improved his dyspnea.  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  He is not taking his Brio inhaler because this did not help him much and he started having some intolerance to this.  We discussed pulmonary rehabilitation but he is not interested.  He is willing to try an alternative Spiriva inhaler to see if he can have further improvement with his dyspnea but overall he still does feel very good no cough.  Chronic edema remains.    OV 04/19/2018  Subjective:  Patient ID: [name], male , DOB: 09/17/1945 , age 72 y.o. , MRN: [id number] , ADDRESS: [place] [address]   04/19/2018 -   Chief Complaint  Patient presents with  . Follow-up    pt c/o dry cough (feels related to sinus) & sob with exertion     HPI [name] 72 y.o. - returns for multifactorial dyspnea followup with emphysema.  Since I last saw him in May 2019 he is not taking his Spiriva inhaler.  In fact he tells me that it made him eat a lot and it increase his appetite.  There are no new issues from a respiratory standpoint.  He has had his flu shot and his pneumonia shot.  In terms of his sleep apnea is using his CPAP only as needed.  He says he sleeps 4 to 5 hours in a recliner and  that is good.  He does not feel that he needs to do anything else from a respiratory standpoint other than to take albuterol occasionally.  He quit smoking in the 1980s and therefore he does not qualify for lung cancer screening program.  Last CT scan was October 2018.  He says the biggest issue for him is his right knee pain and is going to see the orthopod.  He is using a cane.    CAT COPD Symptom & Quality of Life Score (GSK trademark) 0 is no burden. 5 is highest burden 04/19/2018   Never Cough -> Cough all the time 1  No phlegm  in chest -> Chest is full of phlegm 1  No chest tightness -> Chest feels very tight 0  No dyspnea for 1 flight stairs/hill -> Very dyspneic for 1 flight of stairs 2  No limitations for ADL at home -> Very limited with ADL at home 2  Confident leaving home -> Not at all confident leaving home 0  Sleep soundly -> Do not sleep soundly because of lung condition 3  Lots of Energy -> No energy at all 3  TOTAL Score (max 40)  12      ROS - per HPI     has a past medical history of Arthritis, Blind left eye, CAD (coronary artery disease), Closed fracture of lateral portion of right tibial plateau (12/05/2977), Complication of anesthesia, Coronary artery disease, Former smoker, GERD (gastroesophageal reflux disease), Gout, History of bleeding ulcers, History of gout, History of peptic ulcer disease, Hyperlipidemia, Hypertension, Hypertensive heart disease without CHF, Kidney stones, Lumbar disc disease, Myocardial infarction ([place]) (1995; 2007), Obesity, OSA (obstructive sleep apnea), Pneumonia (X 2), Prostate cancer ([place]), Pulmonary nodule, and Second degree Mobitz I AV block.   reports that he quit smoking about 34 years ago. His smoking use included cigarettes. He has a 105.00 pack-year smoking history. He has never used smokeless tobacco.  Past Surgical History:  Procedure Laterality Date  . APPENDECTOMY    . APPENDECTOMY  ~ 1985  . BACK SURGERY    . CARDIAC  CATHETERIZATION N/A 02/08/2015   Procedure: Left Heart Cath and Coronary Angiography;  Surgeon: Leonie Man, MD;  Location: [place] [facility];  Service: Cardiovascular;  Laterality: N/A;  . CARDIAC CATHETERIZATION N/A 02/08/2015   Procedure: Coronary Stent Intervention;  Surgeon: Leonie Man, MD;  Location: [place] [facility];  Service: Cardiovascular;  Laterality: N/A;  . CHOLECYSTECTOMY    . CORONARY ANGIOPLASTY WITH STENT PLACEMENT  ~ 1995; 2007   "1 stent; 2 stents"  . LAPAROSCOPIC CHOLECYSTECTOMY    . LITHOTRIPSY  X 1  . LUMBAR LAMINECTOMY    . ORIF TIBIA PLATEAU Right 11/05/2017   Procedure: OPEN REDUCTION INTERNAL FIXATION (ORIF) TIBIAL PLATEAU;  Surgeon: Shona Needles, MD;  Location: [place];  Service: Orthopedics;  Laterality: Right;  . POSTERIOR LUMBAR FUSION  2003?  . PROSTATE BIOPSY  2015    Allergies  Allergen Reactions  . Prednisone     Immunization History  Administered Date(s) Administered  . Influenza Whole 03/19/2009, 04/17/2010, 03/17/2011  . Influenza, High Dose Seasonal PF 04/05/2018  . Influenza-Unspecified 03/16/2014, 02/07/2015, 03/09/2017  . Pneumococcal Conjugate-13 05/06/2016  . Pneumococcal Polysaccharide-23 03/19/2009    Family History  Problem Relation Age of Onset  . Hypertension Mother   . Coronary artery disease Mother   . Aneurysm Father   . Hypertension Father   . COPD Sister   . Sleep apnea Unknown        3 siblings     Current Outpatient Medications:  .  acetaminophen (TYLENOL) 500 MG tablet, Take 500 mg by mouth every 6 (six) hours as needed for moderate pain., Disp: , Rfl:  .  allopurinol (ZYLOPRIM) 300 MG tablet, Take 300 mg by mouth Daily. , Disp: , Rfl:  .  amLODipine (NORVASC) 10 MG tablet, Take 10 mg by mouth Daily. , Disp: , Rfl:  .  aspirin EC 81 MG EC tablet, Take 1 tablet (81 mg total) by mouth daily., Disp: , Rfl:  .  bisacodyl (DULCOLAX) 5 MG EC tablet, Take 2 tablets (10  mg total) by mouth daily., Disp: 30 tablet,  Rfl: 1 .  hydrALAZINE (APRESOLINE) 50 MG tablet, Take 1 tablet (50 mg total) by mouth 3 (three) times daily., Disp: 90 tablet, Rfl: 0 .  polyethylene glycol (MIRALAX / GLYCOLAX) packet, Take 17 g by mouth 2 (two) times daily., Disp: 14 each, Rfl: 0 .  senna (SENOKOT) 8.6 MG TABS tablet, Take 2 tablets (17.2 mg total) by mouth daily., Disp: 120 each, Rfl: 0 .  albuterol (PROVENTIL) (2.5 MG/3ML) 0.083% nebulizer solution, Take 3 mLs (2.5 mg total) by nebulization every 6 (six) hours as needed for wheezing or shortness of breath., Disp: 75 mL, Rfl: 12 .  Tiotropium Bromide Monohydrate (SPIRIVA RESPIMAT) 1.25 MCG/ACT AERS, Inhale 2 puffs into the lungs daily. (Patient not taking: Reported on 04/19/2018), Disp: 1 Inhaler, Rfl: 5      Objective:   Vitals:   04/19/18 0856  BP: 130/78  Pulse: 61  SpO2: 96%  Weight: (!) 318 lb (144.2 kg)  Height: 6\' 1"  (1.854 m)    Estimated body mass index is 41.96 kg/m as calculated from the following:   Height as of this encounter: 6\' 1"  (1.854 m).   Weight as of this encounter: 318 lb (144.2 kg).  @WEIGHTCHANGE @  [employer]   04/19/18 0856  Weight: (!) 318 lb (144.2 kg)     Physical Exam  General Appearance:    Alert, cooperative, no distress, appears stated age - yes , Deconditioned looking - no , OBESE  - yes, Sitting on Wheelchair -  no  Head:    Normocephalic, without obvious abnormality, atraumatic  Eyes:    PERRL, conjunctiva/corneas clear,  Ears:    Normal TM's and external ear canals, both ears  Nose:   Nares normal, septum midline, mucosa normal, no drainage    or sinus tenderness. OXYGEN ON  - no . Patient is @ ra   Throat:   Lips, mucosa, and tongue normal; teeth and gums normal. Cyanosis on lips - ra  Neck:   Supple, symmetrical, trachea midline, no adenopathy;    thyroid:  no enlargement/tenderness/nodules; no carotid   bruit or JVD  Back:     Symmetric, no curvature, ROM normal, no CVA tenderness  Lungs:     Distress - no ,  Wheeze no, Barrell Chest - no, Purse lip breathing - no, Crackles - no   Chest Wall:    No tenderness or deformity.    Heart:    Regular rate and rhythm, S1 and S2 normal, no rub   or gallop, Murmur - no  Breast Exam:    NOT DONE  Abdomen:     Soft, non-tender, bowel sounds active all four quadrants,    no masses, no organomegaly. Visceral obesity - yes  Genitalia:   NOT DONE  Rectal:   NOT DONE  Extremities:   Extremities - normal, Has Cane - yes, Clubbing - no, Edema - no  Pulses:   2+ and symmetric all extremities  Skin:   Stigmata of Connective Tissue Disease - no  Lymph nodes:   Cervical, supraclavicular, and axillary nodes normal  Psychiatric:  Neurologic:   Pleasant - yes, Anxious - no, Flat affect - no  CAm-ICU - neg, Alert and Oriented x 3 - yes, Moves all 4s - yes, Speech - normal, Cognition - intact           Assessment:       ICD-10-CM   1. Chronic obstructive pulmonary disease, unspecified COPD  type [facility]) J44.9        Plan:     Patient Instructions     ICD-10-CM   1. Chronic obstructive pulmonary disease, unspecified COPD type ([place]) J44.9     Stable Too bad inhalers like breo or spiriva not helping Understand knee is big issue for you Glad uptodate with flu shot  Plan - albuterol as needed - . Please talk to PCP Maury Dus, MD -  and ensure you get  shingrix ([place]) inactivated vaccine against shingles - do  Not qualify for lung cancer screen  Followup - 1 year or sooner if needed     SIGNATURE    Dr. Brand Males, M.D., F.C.C.P,  Pulmonary and Critical Care Medicine Staff Physician, [place] Director - Interstitial Lung Disease  Program  Pulmonary [place] at [place], [place], [address]  Pager: [phone], If no answer or between  15:00h - 7:00h: call [phone] Telephone: [phone]  9:25 AM 04/19/2018

## 2018-04-19 NOTE — Patient Instructions (Signed)
ICD-10-CM   1. Chronic obstructive pulmonary disease, unspecified COPD type ([place]) J44.9     Stable Too bad inhalers like breo or spiriva not helping Understand knee is big issue for you Glad uptodate with flu shot  Plan - albuterol as needed - . Please talk to PCP Maury Dus, MD -  and ensure you get  shingrix ([place]) inactivated vaccine against shingles - do  Not qualify for lung cancer screen  Followup - 1 year or sooner if needed

## 2018-04-22 ENCOUNTER — Ambulatory Visit: Payer: Medicare HMO | Admitting: Podiatry

## 2018-04-22 ENCOUNTER — Encounter: Payer: Self-pay | Admitting: Podiatry

## 2018-04-22 DIAGNOSIS — S82121D Displaced fracture of lateral condyle of right tibia, subsequent encounter for closed fracture with routine healing: Secondary | ICD-10-CM | POA: Diagnosis not present

## 2018-04-22 DIAGNOSIS — L600 Ingrowing nail: Secondary | ICD-10-CM | POA: Diagnosis not present

## 2018-04-22 MED ORDER — NEOMYCIN-POLYMYXIN-HC 3.5-10000-1 OT SOLN: OTIC | 1 refills | Status: DC

## 2018-04-22 NOTE — Patient Instructions (Signed)

## 2018-04-25 NOTE — Progress Notes (Signed)
Subjective:   Patient ID: [name], male   DOB: 72 y.o.   MRN: [id number]   HPI Patient presents stating he has had chronic ingrown toenails of both big toes and increasingly they are hard for him to take care of and is trying to trim them and soak them without relief and also has thick nails.  States it is gradually becoming more of an issue and making it harder to be comfortable.  Patient does not smoke and likes to be active   Review of Systems  All other systems reviewed and are negative.       Objective:  Physical Exam  Constitutional: He appears well-developed and well-nourished.  Cardiovascular: Intact distal pulses.  Pulmonary/Chest: Effort normal.  Musculoskeletal: Normal range of motion.  Neurological: He is alert.  Skin: Skin is warm.  Nursing note and vitals reviewed.   Neurovascular status was found to be intact with muscle strength adequate and range of motion within normal limits.  Patient was noted to have thickened hallux nails bilateral with incurvation of the medial borders that are painful when pressed with no active drainage or redness noted.  Patient has good digital perfusion and is well oriented x3     Assessment:  Chronic ingrown toenail deformity hallux bilateral that are painful when palpated and make shoe gear difficult.  Patient is found to have thickness of the nails also associated with this problem     Plan:  H&P condition reviewed at great length and recommended removal of the nail borders.  I explained procedure and risk and explained healing and patient wants surgery and signed consent form.  I infiltrated each hallux 60 mg like Marcaine mixture sterile prep applied and remove the medial borders of each hallux exposed matrix and applied phenol 3 applications 30 seconds followed by alcohol lavage and sterile dressing.  I did discuss ultimately permanent procedure may be necessary for the entire nail bed depending on the healing process and I  explained the soaks and if it should start to throb to take the dressings off immediately.  Reappoint the next several weeks and prescribed Corticosporin otic solution for utilization after soaks

## 2018-04-28 DIAGNOSIS — G4733 Obstructive sleep apnea (adult) (pediatric): Secondary | ICD-10-CM | POA: Diagnosis not present

## 2018-04-28 DIAGNOSIS — E669 Obesity, unspecified: Secondary | ICD-10-CM | POA: Diagnosis not present

## 2018-04-28 DIAGNOSIS — J449 Chronic obstructive pulmonary disease, unspecified: Secondary | ICD-10-CM | POA: Diagnosis not present

## 2018-04-28 DIAGNOSIS — J9690 Respiratory failure, unspecified, unspecified whether with hypoxia or hypercapnia: Secondary | ICD-10-CM | POA: Diagnosis not present

## 2018-05-11 DIAGNOSIS — H4052X3 Glaucoma secondary to other eye disorders, left eye, severe stage: Secondary | ICD-10-CM | POA: Diagnosis not present

## 2018-05-11 DIAGNOSIS — H40032 Anatomical narrow angle, left eye: Secondary | ICD-10-CM | POA: Diagnosis not present

## 2018-05-11 DIAGNOSIS — H43393 Other vitreous opacities, bilateral: Secondary | ICD-10-CM | POA: Diagnosis not present

## 2018-05-11 DIAGNOSIS — H40033 Anatomical narrow angle, bilateral: Secondary | ICD-10-CM | POA: Diagnosis not present

## 2018-05-17 DIAGNOSIS — G4733 Obstructive sleep apnea (adult) (pediatric): Secondary | ICD-10-CM | POA: Diagnosis not present

## 2018-05-26 DIAGNOSIS — G4733 Obstructive sleep apnea (adult) (pediatric): Secondary | ICD-10-CM | POA: Diagnosis not present

## 2018-05-28 DIAGNOSIS — J9690 Respiratory failure, unspecified, unspecified whether with hypoxia or hypercapnia: Secondary | ICD-10-CM | POA: Diagnosis not present

## 2018-05-28 DIAGNOSIS — G4733 Obstructive sleep apnea (adult) (pediatric): Secondary | ICD-10-CM | POA: Diagnosis not present

## 2018-05-28 DIAGNOSIS — J449 Chronic obstructive pulmonary disease, unspecified: Secondary | ICD-10-CM | POA: Diagnosis not present

## 2018-05-28 DIAGNOSIS — E669 Obesity, unspecified: Secondary | ICD-10-CM | POA: Diagnosis not present

## 2018-06-07 DIAGNOSIS — I2510 Atherosclerotic heart disease of native coronary artery without angina pectoris: Secondary | ICD-10-CM | POA: Diagnosis not present

## 2018-06-07 DIAGNOSIS — N183 Chronic kidney disease, stage 3 (moderate): Secondary | ICD-10-CM | POA: Diagnosis not present

## 2018-06-07 DIAGNOSIS — K219 Gastro-esophageal reflux disease without esophagitis: Secondary | ICD-10-CM | POA: Diagnosis not present

## 2018-06-07 DIAGNOSIS — Z6841 Body Mass Index (BMI) 40.0 and over, adult: Secondary | ICD-10-CM | POA: Diagnosis not present

## 2018-06-07 DIAGNOSIS — I129 Hypertensive chronic kidney disease with stage 1 through stage 4 chronic kidney disease, or unspecified chronic kidney disease: Secondary | ICD-10-CM | POA: Diagnosis not present

## 2018-06-07 DIAGNOSIS — E7800 Pure hypercholesterolemia, unspecified: Secondary | ICD-10-CM | POA: Diagnosis not present

## 2018-06-07 DIAGNOSIS — C61 Malignant neoplasm of prostate: Secondary | ICD-10-CM | POA: Diagnosis not present

## 2018-06-07 DIAGNOSIS — Z1159 Encounter for screening for other viral diseases: Secondary | ICD-10-CM | POA: Diagnosis not present

## 2018-06-07 DIAGNOSIS — M109 Gout, unspecified: Secondary | ICD-10-CM | POA: Diagnosis not present

## 2018-06-07 DIAGNOSIS — Z0000 Encounter for general adult medical examination without abnormal findings: Secondary | ICD-10-CM | POA: Diagnosis not present

## 2018-06-07 DIAGNOSIS — Z1389 Encounter for screening for other disorder: Secondary | ICD-10-CM | POA: Diagnosis not present

## 2018-06-17 DIAGNOSIS — G4733 Obstructive sleep apnea (adult) (pediatric): Secondary | ICD-10-CM | POA: Diagnosis not present

## 2018-06-28 DIAGNOSIS — J9690 Respiratory failure, unspecified, unspecified whether with hypoxia or hypercapnia: Secondary | ICD-10-CM | POA: Diagnosis not present

## 2018-06-28 DIAGNOSIS — J449 Chronic obstructive pulmonary disease, unspecified: Secondary | ICD-10-CM | POA: Diagnosis not present

## 2018-06-28 DIAGNOSIS — E669 Obesity, unspecified: Secondary | ICD-10-CM | POA: Diagnosis not present

## 2018-06-28 DIAGNOSIS — G4733 Obstructive sleep apnea (adult) (pediatric): Secondary | ICD-10-CM | POA: Diagnosis not present

## 2018-07-12 ENCOUNTER — Emergency Department (HOSPITAL_COMMUNITY): Payer: Medicare HMO

## 2018-07-12 ENCOUNTER — Encounter (HOSPITAL_COMMUNITY): Payer: Self-pay

## 2018-07-12 ENCOUNTER — Observation Stay (HOSPITAL_COMMUNITY)
Admission: EM | Admit: 2018-07-12 | Discharge: 2018-07-14 | Disposition: A | Discharge status: Home / Self Care | Payer: Medicare HMO | Attending: Internal Medicine | Admitting: Internal Medicine

## 2018-07-12 DIAGNOSIS — I4891 Unspecified atrial fibrillation: Secondary | ICD-10-CM | POA: Diagnosis not present

## 2018-07-12 DIAGNOSIS — R072 Precordial pain: Secondary | ICD-10-CM | POA: Diagnosis not present

## 2018-07-12 DIAGNOSIS — J45901 Unspecified asthma with (acute) exacerbation: Secondary | ICD-10-CM | POA: Diagnosis not present

## 2018-07-12 DIAGNOSIS — Z79899 Other long term (current) drug therapy: Secondary | ICD-10-CM | POA: Diagnosis not present

## 2018-07-12 DIAGNOSIS — R0989 Other specified symptoms and signs involving the circulatory and respiratory systems: Secondary | ICD-10-CM | POA: Insufficient documentation

## 2018-07-12 DIAGNOSIS — I25110 Atherosclerotic heart disease of native coronary artery with unstable angina pectoris: Secondary | ICD-10-CM | POA: Diagnosis not present

## 2018-07-12 DIAGNOSIS — Z8249 Family history of ischemic heart disease and other diseases of the circulatory system: Secondary | ICD-10-CM | POA: Diagnosis not present

## 2018-07-12 DIAGNOSIS — I25118 Atherosclerotic heart disease of native coronary artery with other forms of angina pectoris: Secondary | ICD-10-CM

## 2018-07-12 DIAGNOSIS — E785 Hyperlipidemia, unspecified: Secondary | ICD-10-CM | POA: Diagnosis not present

## 2018-07-12 DIAGNOSIS — J449 Chronic obstructive pulmonary disease, unspecified: Secondary | ICD-10-CM | POA: Diagnosis not present

## 2018-07-12 DIAGNOSIS — I10 Essential (primary) hypertension: Secondary | ICD-10-CM | POA: Diagnosis present

## 2018-07-12 DIAGNOSIS — J9611 Chronic respiratory failure with hypoxia: Secondary | ICD-10-CM | POA: Diagnosis not present

## 2018-07-12 DIAGNOSIS — K5900 Constipation, unspecified: Secondary | ICD-10-CM | POA: Diagnosis not present

## 2018-07-12 DIAGNOSIS — R109 Unspecified abdominal pain: Secondary | ICD-10-CM | POA: Diagnosis not present

## 2018-07-12 DIAGNOSIS — K219 Gastro-esophageal reflux disease without esophagitis: Secondary | ICD-10-CM | POA: Diagnosis not present

## 2018-07-12 DIAGNOSIS — R0902 Hypoxemia: Secondary | ICD-10-CM | POA: Diagnosis not present

## 2018-07-12 DIAGNOSIS — M109 Gout, unspecified: Secondary | ICD-10-CM | POA: Insufficient documentation

## 2018-07-12 DIAGNOSIS — Z955 Presence of coronary angioplasty implant and graft: Secondary | ICD-10-CM | POA: Diagnosis not present

## 2018-07-12 DIAGNOSIS — Z7982 Long term (current) use of aspirin: Secondary | ICD-10-CM | POA: Insufficient documentation

## 2018-07-12 DIAGNOSIS — I491 Atrial premature depolarization: Secondary | ICD-10-CM | POA: Diagnosis not present

## 2018-07-12 DIAGNOSIS — K5730 Diverticulosis of large intestine without perforation or abscess without bleeding: Secondary | ICD-10-CM | POA: Diagnosis not present

## 2018-07-12 DIAGNOSIS — J45909 Unspecified asthma, uncomplicated: Secondary | ICD-10-CM | POA: Diagnosis not present

## 2018-07-12 DIAGNOSIS — R079 Chest pain, unspecified: Secondary | ICD-10-CM | POA: Diagnosis not present

## 2018-07-12 DIAGNOSIS — I252 Old myocardial infarction: Secondary | ICD-10-CM | POA: Insufficient documentation

## 2018-07-12 DIAGNOSIS — G4733 Obstructive sleep apnea (adult) (pediatric): Secondary | ICD-10-CM | POA: Diagnosis not present

## 2018-07-12 DIAGNOSIS — N183 Chronic kidney disease, stage 3 (moderate): Secondary | ICD-10-CM | POA: Diagnosis not present

## 2018-07-12 DIAGNOSIS — R0602 Shortness of breath: Secondary | ICD-10-CM | POA: Diagnosis not present

## 2018-07-12 DIAGNOSIS — I1310 Hypertensive heart and chronic kidney disease without heart failure, with stage 1 through stage 4 chronic kidney disease, or unspecified chronic kidney disease: Secondary | ICD-10-CM | POA: Insufficient documentation

## 2018-07-12 DIAGNOSIS — I2510 Atherosclerotic heart disease of native coronary artery without angina pectoris: Secondary | ICD-10-CM | POA: Diagnosis present

## 2018-07-12 DIAGNOSIS — H5462 Unqualified visual loss, left eye, normal vision right eye: Secondary | ICD-10-CM | POA: Diagnosis not present

## 2018-07-12 DIAGNOSIS — R9431 Abnormal electrocardiogram [ECG] [EKG]: Secondary | ICD-10-CM | POA: Diagnosis not present

## 2018-07-12 DIAGNOSIS — R05 Cough: Secondary | ICD-10-CM | POA: Diagnosis not present

## 2018-07-12 DIAGNOSIS — Z9049 Acquired absence of other specified parts of digestive tract: Secondary | ICD-10-CM | POA: Diagnosis not present

## 2018-07-12 DIAGNOSIS — R1084 Generalized abdominal pain: Secondary | ICD-10-CM | POA: Diagnosis not present

## 2018-07-12 DIAGNOSIS — I441 Atrioventricular block, second degree: Secondary | ICD-10-CM | POA: Diagnosis present

## 2018-07-12 DIAGNOSIS — Z888 Allergy status to other drugs, medicaments and biological substances status: Secondary | ICD-10-CM | POA: Diagnosis not present

## 2018-07-12 DIAGNOSIS — Z87891 Personal history of nicotine dependence: Secondary | ICD-10-CM | POA: Diagnosis not present

## 2018-07-12 DIAGNOSIS — Z8711 Personal history of peptic ulcer disease: Secondary | ICD-10-CM

## 2018-07-12 DIAGNOSIS — I200 Unstable angina: Secondary | ICD-10-CM | POA: Diagnosis present

## 2018-07-12 LAB — BASIC METABOLIC PANEL
ANION GAP: 9 (ref 5–15)
BUN: 14 mg/dL (ref 8–23)
CALCIUM: 10.3 mg/dL (ref 8.9–10.3)
CHLORIDE: 98 mmol/L (ref 98–111)
CO2: 30 mmol/L (ref 22–32)
Creatinine, Ser: 1.16 mg/dL (ref 0.61–1.24)
GFR calc non Af Amer: >60 mL/min (ref >60)
Glucose, Bld: 97 mg/dL (ref 70–99)
POTASSIUM: 3.8 mmol/L (ref 3.5–5.1)
Sodium: 137 mmol/L (ref 135–145)

## 2018-07-12 LAB — HEPATIC FUNCTION PANEL
ALBUMIN: 3.7 g/dL (ref 3.5–5.0)
ALT: 20 U/L (ref 0–44)
AST: 21 U/L (ref 15–41)
Alkaline Phosphatase: 53 U/L (ref 38–126)
Bilirubin, Direct: 0.4 mg/dL — ABNORMAL HIGH (ref 0.0–0.2)
Indirect Bilirubin: 1.8 mg/dL — ABNORMAL HIGH (ref 0.3–0.9)
Total Bilirubin: 2.2 mg/dL — ABNORMAL HIGH (ref 0.3–1.2)
Total Protein: 7.8 g/dL (ref 6.5–8.1)

## 2018-07-12 LAB — CBC
HEMATOCRIT: 49.1 % (ref 39.0–52.0)
HEMOGLOBIN: 15.7 g/dL (ref 13.0–17.0)
MCH: 29.1 pg (ref 26.0–34.0)
MCHC: 32 g/dL (ref 30.0–36.0)
MCV: 91.1 fL (ref 80.0–100.0)
NRBC: 0 % (ref 0.0–0.2)
PLATELETS: 214 10*3/uL (ref 150–400)
RBC: 5.39 MIL/uL (ref 4.22–5.81)
RDW: 13.4 % (ref 11.5–15.5)
WBC: 12.6 10*3/uL — ABNORMAL HIGH (ref 4.0–10.5)

## 2018-07-12 LAB — URINALYSIS, ROUTINE W REFLEX MICROSCOPIC
Bacteria, UA: NONE SEEN
Bilirubin Urine: NEGATIVE
Glucose, UA: NEGATIVE mg/dL
Hgb urine dipstick: NEGATIVE
Ketones, ur: NEGATIVE mg/dL
Leukocytes, UA: NEGATIVE
Nitrite: NEGATIVE
Protein, ur: 100 mg/dL — AB
Specific Gravity, Urine: >1.046 — ABNORMAL HIGH (ref 1.005–1.030)
pH: 5 (ref 5.0–8.0)

## 2018-07-12 LAB — I-STAT TROPONIN, ED
Troponin i, poc: 0.01 ng/mL (ref 0.00–0.08)
Troponin i, poc: 0.01 ng/mL (ref 0.00–0.08)

## 2018-07-12 LAB — LIPASE, BLOOD: LIPASE: 58 U/L — AB (ref 11–51)

## 2018-07-12 LAB — TROPONIN I: Troponin I: <0.03 ng/mL (ref <0.03)

## 2018-07-12 MED ORDER — SENNA 8.6 MG PO TABS
2.0000 | ORAL_TABLET | Freq: Every day | ORAL | Status: DC
  Administered 2018-07-14: 17.2 mg via ORAL
  Filled 2018-07-12: qty 2

## 2018-07-12 MED ORDER — PANTOPRAZOLE SODIUM 40 MG IV SOLR
40.0000 mg | Freq: Once | INTRAVENOUS | Status: AC
  Administered 2018-07-12: 40 mg via INTRAVENOUS
  Filled 2018-07-12: qty 40

## 2018-07-12 MED ORDER — SODIUM CHLORIDE 0.9% FLUSH: 3.0000 mL | Freq: Once | INTRAVENOUS | Status: DC

## 2018-07-12 MED ORDER — HYDRALAZINE HCL 25 MG PO TABS: 50.0000 mg | ORAL_TABLET | Freq: Three times a day (TID) | ORAL | Status: DC

## 2018-07-12 MED ORDER — PANTOPRAZOLE SODIUM 40 MG PO TBEC
40.0000 mg | DELAYED_RELEASE_TABLET | Freq: Every day | ORAL | Status: DC
  Administered 2018-07-14: 40 mg via ORAL
  Filled 2018-07-12: qty 1

## 2018-07-12 MED ORDER — MORPHINE SULFATE (PF) 2 MG/ML IV SOLN: 2.0000 mg | Freq: Once | INTRAVENOUS | Status: DC

## 2018-07-12 MED ORDER — SODIUM CHLORIDE 0.9 % IV SOLN
INTRAVENOUS | Status: DC
  Administered 2018-07-12: 17:00:00 via INTRAVENOUS

## 2018-07-12 MED ORDER — ALUM & MAG HYDROXIDE-SIMETH 200-200-20 MG/5ML PO SUSP: 30.0000 mL | Freq: Four times a day (QID) | ORAL | Status: DC | PRN

## 2018-07-12 MED ORDER — ASPIRIN EC 81 MG PO TBEC
81.0000 mg | DELAYED_RELEASE_TABLET | Freq: Every day | ORAL | Status: DC
  Administered 2018-07-13 – 2018-07-14 (×2): 81 mg via ORAL
  Filled 2018-07-12 (×2): qty 1

## 2018-07-12 MED ORDER — POLYETHYLENE GLYCOL 3350 17 G PO PACK
17.0000 g | PACK | Freq: Two times a day (BID) | ORAL | Status: DC
  Administered 2018-07-13 – 2018-07-14 (×2): 17 g via ORAL
  Filled 2018-07-12 (×2): qty 1

## 2018-07-12 MED ORDER — ONDANSETRON HCL 4 MG/2ML IJ SOLN
4.0000 mg | Freq: Once | INTRAMUSCULAR | Status: AC
  Administered 2018-07-12: 4 mg via INTRAVENOUS
  Filled 2018-07-12: qty 2

## 2018-07-12 MED ORDER — ENOXAPARIN SODIUM 40 MG/0.4ML ~~LOC~~ SOLN
40.0000 mg | SUBCUTANEOUS | Status: DC
  Administered 2018-07-12: 40 mg via SUBCUTANEOUS
  Filled 2018-07-12: qty 0.4

## 2018-07-12 MED ORDER — ALLOPURINOL 300 MG PO TABS
300.0000 mg | ORAL_TABLET | Freq: Every day | ORAL | Status: DC
  Administered 2018-07-14: 300 mg via ORAL
  Filled 2018-07-12: qty 1

## 2018-07-12 MED ORDER — ASPIRIN 81 MG PO CHEW
324.0000 mg | CHEWABLE_TABLET | Freq: Once | ORAL | Status: AC
  Administered 2018-07-12: 324 mg via ORAL
  Filled 2018-07-12: qty 4

## 2018-07-12 MED ORDER — SODIUM CHLORIDE 0.9% FLUSH
3.0000 mL | Freq: Two times a day (BID) | INTRAVENOUS | Status: DC
  Administered 2018-07-12 – 2018-07-13 (×2): 3 mL via INTRAVENOUS

## 2018-07-12 MED ORDER — UMECLIDINIUM BROMIDE 62.5 MCG/INH IN AEPB
1.0000 | INHALATION_SPRAY | Freq: Every day | RESPIRATORY_TRACT | Status: DC
  Administered 2018-07-13: 1 via RESPIRATORY_TRACT
  Filled 2018-07-12: qty 7

## 2018-07-12 MED ORDER — SODIUM CHLORIDE 0.9% FLUSH: 3.0000 mL | INTRAVENOUS | Status: DC | PRN

## 2018-07-12 MED ORDER — ONDANSETRON HCL 4 MG/2ML IJ SOLN
4.0000 mg | Freq: Four times a day (QID) | INTRAMUSCULAR | Status: DC | PRN
  Administered 2018-07-14 (×2): 4 mg via INTRAVENOUS
  Filled 2018-07-12 (×2): qty 2

## 2018-07-12 MED ORDER — NITROGLYCERIN 0.4 MG SL SUBL: 0.4000 mg | SUBLINGUAL_TABLET | SUBLINGUAL | Status: DC | PRN

## 2018-07-12 MED ORDER — LIDOCAINE VISCOUS HCL 2 % MT SOLN: 15.0000 mL | Freq: Four times a day (QID) | OROMUCOSAL | Status: DC | PRN

## 2018-07-12 MED ORDER — SODIUM CHLORIDE 0.9 % IV SOLN: 250.0000 mL | INTRAVENOUS | Status: DC | PRN

## 2018-07-12 MED ORDER — BISACODYL 5 MG PO TBEC
10.0000 mg | DELAYED_RELEASE_TABLET | Freq: Every day | ORAL | Status: DC
  Administered 2018-07-14: 10 mg via ORAL
  Filled 2018-07-12: qty 2

## 2018-07-12 MED ORDER — IOPAMIDOL (ISOVUE-370) INJECTION 76%
100.0000 mL | Freq: Once | INTRAVENOUS | Status: AC | PRN
  Administered 2018-07-12: 100 mL via INTRAVENOUS

## 2018-07-12 MED ORDER — ACETAMINOPHEN 325 MG PO TABS
650.0000 mg | ORAL_TABLET | ORAL | Status: DC | PRN
  Administered 2018-07-13 – 2018-07-14 (×2): 650 mg via ORAL
  Filled 2018-07-12 (×2): qty 2

## 2018-07-12 MED ORDER — ALBUTEROL SULFATE (2.5 MG/3ML) 0.083% IN NEBU: 2.5000 mg | INHALATION_SOLUTION | RESPIRATORY_TRACT | Status: DC | PRN

## 2018-07-12 NOTE — H&P (Signed)
History and Physical    [name] ATF:[id number] DOB: [birth year] DOA: 07/12/2018  PCP: Maury Dus, MD   Patient coming from: Home   Chief Complaint: pain in abdomen and chest, SOB   HPI: [name] is a 73 y.o. male with medical history significant for severe OSA, chronic dyspnea with possible asthma and OHS followed by pulmonology, coronary artery disease, hypertension, and second-degree Mobitz 1 block, now presenting to the emergency department for evaluation of shortness of breath, chest pain, and abdominal pain.  Patient reports insidiously worsening exertional dyspnea with mild nonproductive cough and no fevers or chills.  He also reports pain in the abdomen and chest that started yesterday, has been waxing and waning, without alleviating or exacerbating factors identified, and unlike his prior experience with angina.  Patient acknowledges nonadherence with his home oxygen and nocturnal BiPAP.  He is prescribed albuterol, but has not been using it.  ED Course: Upon arrival to the ED, patient is found to be afebrile, bradycardic as low as the mid 40s, slightly hypertensive, and saturating mid 90s on 4 L/min of supplemental oxygen.  EKG features a sinus rhythm with second-degree AV block Mobitz type I.  Chest x-ray is notable for cardiomegaly and mild pulmonary vascular congestion without frank edema.  CTA chest/abdomen/pelvis is negative for PE, aortic aneurysm or dissection, or acute pulmonary disease, but notable for reflux of contrast into the IVC, possibly related to tricuspid regurgitation.  CBC is notable for mild leukocytosis and chemistry panel features a bilirubin of 2.2.  Cardiology was consulted by the ED physician and recommended medical admission.  Review of Systems:  All other systems reviewed and apart from HPI, are negative.  Past Medical History:  Diagnosis Date  . Arthritis    "left shoulder" (02/09/2015)  . Blind left eye   . CAD (coronary artery disease)     . Closed fracture of lateral portion of right tibial plateau 11/01/2017  . Complication of anesthesia    "they give me too much anesthesia & had to put me on life support for a little bit w/gallbladder OR"  . Coronary artery disease   . Former smoker   . GERD (gastroesophageal reflux disease)   . Gout   . History of bleeding ulcers   . History of gout   . History of peptic ulcer disease   . Hyperlipidemia   . Hypertension   . Hypertensive heart disease without CHF   . Kidney stones   . Lumbar disc disease   . Myocardial infarction [facility]) 1995; 2007  . Obesity   . OSA (obstructive sleep apnea)   . Pneumonia X 2  . Prostate cancer ([place])   . Pulmonary nodule   . Second degree Mobitz I AV block     Past Surgical History:  Procedure Laterality Date  . APPENDECTOMY    . APPENDECTOMY  ~ 1985  . BACK SURGERY    . CARDIAC CATHETERIZATION N/A 02/08/2015   Procedure: Left Heart Cath and Coronary Angiography;  Surgeon: Leonie Man, MD;  Location: [place] [facility];  Service: Cardiovascular;  Laterality: N/A;  . CARDIAC CATHETERIZATION N/A 02/08/2015   Procedure: Coronary Stent Intervention;  Surgeon: Leonie Man, MD;  Location: [place] [facility];  Service: Cardiovascular;  Laterality: N/A;  . CHOLECYSTECTOMY    . CORONARY ANGIOPLASTY WITH STENT PLACEMENT  ~ 1995; 2007   "1 stent; 2 stents"  . LAPAROSCOPIC CHOLECYSTECTOMY    . LITHOTRIPSY  X 1  .  LUMBAR LAMINECTOMY    . ORIF TIBIA PLATEAU Right 11/05/2017   Procedure: OPEN REDUCTION INTERNAL FIXATION (ORIF) TIBIAL PLATEAU;  Surgeon: Shona Needles, MD;  Location: [place];  Service: Orthopedics;  Laterality: Right;  . POSTERIOR LUMBAR FUSION  2003?  . PROSTATE BIOPSY  2015     reports that he quit smoking about 35 years ago. His smoking use included cigarettes. He has a 105.00 pack-year smoking history. He has never used smokeless tobacco. He reports current alcohol use. He reports that he does not use drugs.  Allergies   Allergen Reactions  . Prednisone     Family History  Problem Relation Age of Onset  . Hypertension Mother   . Coronary artery disease Mother   . Aneurysm Father   . Hypertension Father   . COPD Sister   . Sleep apnea Unknown        3 siblings     Prior to Admission medications   Medication Sig Start Date End Date Taking? Authorizing Provider  acetaminophen (TYLENOL) 500 MG tablet Take 500 mg by mouth every 6 (six) hours as needed for moderate pain.    [provider]  albuterol (PROVENTIL) (2.5 MG/3ML) 0.083% nebulizer solution Take 3 mLs (2.5 mg total) by nebulization every 6 (six) hours as needed for wheezing or shortness of breath. 04/19/18   Brand Males, MD  allopurinol (ZYLOPRIM) 300 MG tablet Take 300 mg by mouth Daily.  11/07/11   [provider]  amLODipine (NORVASC) 10 MG tablet Take 10 mg by mouth Daily.  11/07/11   [provider]  aspirin EC 81 MG EC tablet Take 1 tablet (81 mg total) by mouth daily. 02/10/15   Jacolyn Reedy, MD  bisacodyl (DULCOLAX) 5 MG EC tablet Take 2 tablets (10 mg total) by mouth daily. 11/11/17 11/11/18  Georgette Shell, MD  hydrALAZINE (APRESOLINE) 50 MG tablet Take 1 tablet (50 mg total) by mouth 3 (three) times daily. 11/09/17   Georgette Shell, MD  neomycin-polymyxin-hydrocortisone (CORTISPORIN) OTIC solution Apply 1-2 drops to toe after soaking BID 04/22/18   Regal, Tamala Fothergill, DPM  polyethylene glycol (MIRALAX / GLYCOLAX) packet Take 17 g by mouth 2 (two) times daily. 11/09/17   Georgette Shell, MD  senna (SENOKOT) 8.6 MG TABS tablet Take 2 tablets (17.2 mg total) by mouth daily. 11/09/17   Georgette Shell, MD  Tiotropium Bromide Monohydrate (SPIRIVA RESPIMAT) 1.25 MCG/ACT AERS Inhale 2 puffs into the lungs daily. 10/19/17   Brand Males, MD    Physical Exam: Vitals:   07/12/18 1540 07/12/18 1600 07/12/18 1638 07/12/18 1800  BP: (!) 166/82 (!) 159/69 (!) 158/74 (!) 153/71  Pulse: (!) 54 65  60 (!) 44  Resp: 19 20 (!) 24 (!) 30  Temp:      TempSrc:      SpO2: 96% 93%  94%    Constitutional: NAD, calm  Eyes: PERTLA, lids and conjunctivae normal ENMT: Mucous membranes are moist. Posterior pharynx clear of any exudate or lesions.   Neck: normal, supple, no masses, no thyromegaly Respiratory: Breath sounds diminished bilaterally, no wheezing, no crackles. Mild dyspnea with speech. Normal respiratory effort.   Cardiovascular: S1 & S2 heard, regular rate and rhythm. Mild LE edema bilaterally. Abdomen: No distension, no tenderness, soft. Bowel sounds active.  Musculoskeletal: no clubbing / cyanosis. No joint deformity upper and lower extremities.   Skin: no significant rashes, lesions, ulcers. Warm, dry, well-perfused. Neurologic: No facial asymmetry. Sensation intact. Moving all  extremities.  Psychiatric: Alert and oriented to person, place, and situation. Calm, cooperative.    Labs on Admission: I have personally reviewed following labs and imaging studies  CBC: Recent Labs  Lab 07/12/18 1153  WBC 12.6*  HGB 15.7  HCT 49.1  MCV 91.1  PLT 662   Basic Metabolic Panel: Recent Labs  Lab 07/12/18 1153  NA 137  K 3.8  CL 98  CO2 30  GLUCOSE 97  BUN 14  CREATININE 1.16  CALCIUM 10.3   GFR: CrCl cannot be calculated (Unknown ideal weight.). Liver Function Tests: Recent Labs  Lab 07/12/18 1634  AST 21  ALT 20  ALKPHOS 53  BILITOT 2.2*  PROT 7.8  ALBUMIN 3.7   Recent Labs  Lab 07/12/18 1634  LIPASE 58*   No results for input(s): AMMONIA in the last 168 hours. Coagulation Profile: No results for input(s): INR, PROTIME in the last 168 hours. Cardiac Enzymes: No results for input(s): CKTOTAL, CKMB, CKMBINDEX, TROPONINI in the last 168 hours. BNP (last 3 results) No results for input(s): PROBNP in the last 8760 hours. HbA1C: No results for input(s): HGBA1C in the last 72 hours. CBG: No results for input(s): GLUCAP in the last 168 hours. Lipid  Profile: No results for input(s): CHOL, HDL, LDLCALC, TRIG, CHOLHDL, LDLDIRECT in the last 72 hours. Thyroid Function Tests: No results for input(s): TSH, T4TOTAL, FREET4, T3FREE, THYROIDAB in the last 72 hours. Anemia Panel: No results for input(s): VITAMINB12, FOLATE, FERRITIN, TIBC, IRON, RETICCTPCT in the last 72 hours. Urine analysis:    Component Value Date/Time   COLORURINE YELLOW 03/24/2009 2110   APPEARANCEUR CLEAR 03/24/2009 2110   LABSPEC 1.030 03/24/2009 2110   PHURINE 5.0 03/24/2009 2110   GLUCOSEU NEGATIVE 03/24/2009 2110   HGBUR NEGATIVE 03/24/2009 2110   BILIRUBINUR SMALL (A) 03/24/2009 2110   KETONESUR NEGATIVE 03/24/2009 2110   PROTEINUR NEGATIVE 03/24/2009 2110   UROBILINOGEN 0.2 03/24/2009 2110   NITRITE NEGATIVE 03/24/2009 2110   LEUKOCYTESUR  03/24/2009 2110    NEGATIVE MICROSCOPIC NOT DONE ON URINES WITH NEGATIVE PROTEIN, BLOOD, LEUKOCYTES, NITRITE, OR GLUCOSE <1000 mg/dL.   Sepsis Labs: @LABRCNTIP (procalcitonin:4,lacticidven:4) )No results found for this or any previous visit (from the past 240 hour(s)).   Radiological Exams on Admission: Dg Chest 2 View  Result Date: 07/12/2018 CLINICAL DATA:  Chest pain. Cough and shortness of breath for the last 2 days. EXAM: CHEST - 2 VIEW COMPARISON:  One-view chest x-ray 11/01/2017 FINDINGS: The heart is enlarged. Mild pulmonary vascular congestion is present. There are no effusions. No focal airspace disease is present. Visualized soft tissues and bony thorax are unremarkable. IMPRESSION: 1. Cardiomegaly and mild pulmonary vascular congestion without frank edema. 2. No focal airspace disease. Electronically Signed   By: San Morelle M.D.   On: 07/12/2018 12:33   Ct Angio Chest Pe W/cm &/or Wo Cm  Result Date: 07/12/2018 CLINICAL DATA:  Chest and abdominal pain with shortness of breath and dizziness. EXAM: CT ANGIOGRAPHY CHEST CT ABDOMEN AND PELVIS WITH CONTRAST TECHNIQUE: Multidetector CT imaging of the chest  was performed using the standard protocol during bolus administration of intravenous contrast. Multiplanar CT image reconstructions and MIPs were obtained to evaluate the vascular anatomy. Multidetector CT imaging of the abdomen and pelvis was performed using the standard protocol during bolus administration of intravenous contrast. CONTRAST:  1100mL ISOVUE-370 IOPAMIDOL (ISOVUE-370) INJECTION 76% COMPARISON:  Chest CT 04/15/2017 FINDINGS: CTA CHEST FINDINGS Cardiovascular: The heart is upper limits of normal in size and stable. Stable  mild tortuosity, ectasia and calcification of the thoracic aorta. The branch vessels are patent. Stable coronary artery calcifications and coronary artery stents. The pulmonary arterial tree is fairly well opacified. No filling defects to suggest pulmonary embolism. Mediastinum/Nodes: No mediastinal or hilar mass or adenopathy. The esophagus is unremarkable. Lungs/Pleura: Limited by breathing motion artifact but no obvious infiltrates or pulmonary lesions. Patchy basilar subsegmental atelectasis and some scarring changes. No pleural effusion. Musculoskeletal: No significant bony findings. Review of the MIP images confirms the above findings. CT ABDOMEN and PELVIS FINDINGS Hepatobiliary: No focal hepatic lesions or intrahepatic biliary dilatation. The gallbladder is surgically absent. No common bile duct dilatation. Pancreas: No mass, inflammation or ductal dilatation. Spleen: Normal size.  No focal lesions. Adrenals/Urinary Tract: The adrenal glands and kidneys are unremarkable and stable. No worrisome renal lesions or hydronephrosis. The bladder is unremarkable. Stomach/Bowel: The stomach, duodenum, small bowel and colon are unremarkable. No acute inflammatory changes, mass lesions or obstructive findings. Descending and sigmoid colon diverticulosis but no findings for acute diverticulitis. The terminal ileum is normal. The appendix is surgically absent. Vascular/Lymphatic: Advanced  atherosclerotic calcifications involving the aorta and iliac arteries. No aneurysm or dissection. The branch vessels are patent. The major venous structures are patent. Small scattered mesenteric and retroperitoneal lymph nodes but no mass or overt adenopathy. Reproductive: The prostate gland and seminal vesicles are unremarkable. Other: Borderline right-sided pelvic lymph nodes are noted. 12.5 mm lateral external iliac lymph node on image number 73. 11 mm obturator region lymph node on image number 78. 10 mm external iliac lymph node on image number 77. No inguinal lymphadenopathy. Musculoskeletal: No significant bony findings. Lumbar fusion hardware noted at L4-5. There is advanced degenerate disc disease at L5-S1. Moderate bilateral hip joint degenerative changes. Review of the MIP images confirms the above findings. IMPRESSION: 1. No CT findings for acute pulmonary embolism. 2. Reflux of contrast material down the IVC may be due to tricuspid regurgitation. 3. No thoracic aortic aneurysm or dissection. Moderate atherosclerotic calcifications. 4. No acute pulmonary findings. Streaky bibasilar atelectasis and scarring. 5. No acute abdominal/pelvic findings, mass lesions or adenopathy. 6. Borderline right-sided external iliac lymph nodes. A repeat pelvic CT scan in 4 months may be helpful to re-evaluate. 7. Colonic diverticulosis without findings for acute diverticulitis. Electronically Signed   By: Marijo Sanes M.D.   On: 07/12/2018 17:26   Ct Abdomen Pelvis W Contrast  Result Date: 07/12/2018 CLINICAL DATA:  Chest and abdominal pain with shortness of breath and dizziness. EXAM: CT ANGIOGRAPHY CHEST CT ABDOMEN AND PELVIS WITH CONTRAST TECHNIQUE: Multidetector CT imaging of the chest was performed using the standard protocol during bolus administration of intravenous contrast. Multiplanar CT image reconstructions and MIPs were obtained to evaluate the vascular anatomy. Multidetector CT imaging of the abdomen  and pelvis was performed using the standard protocol during bolus administration of intravenous contrast. CONTRAST:  19mL ISOVUE-370 IOPAMIDOL (ISOVUE-370) INJECTION 76% COMPARISON:  Chest CT 04/15/2017 FINDINGS: CTA CHEST FINDINGS Cardiovascular: The heart is upper limits of normal in size and stable. Stable mild tortuosity, ectasia and calcification of the thoracic aorta. The branch vessels are patent. Stable coronary artery calcifications and coronary artery stents. The pulmonary arterial tree is fairly well opacified. No filling defects to suggest pulmonary embolism. Mediastinum/Nodes: No mediastinal or hilar mass or adenopathy. The esophagus is unremarkable. Lungs/Pleura: Limited by breathing motion artifact but no obvious infiltrates or pulmonary lesions. Patchy basilar subsegmental atelectasis and some scarring changes. No pleural effusion. Musculoskeletal: No significant bony findings. Review  of the MIP images confirms the above findings. CT ABDOMEN and PELVIS FINDINGS Hepatobiliary: No focal hepatic lesions or intrahepatic biliary dilatation. The gallbladder is surgically absent. No common bile duct dilatation. Pancreas: No mass, inflammation or ductal dilatation. Spleen: Normal size.  No focal lesions. Adrenals/Urinary Tract: The adrenal glands and kidneys are unremarkable and stable. No worrisome renal lesions or hydronephrosis. The bladder is unremarkable. Stomach/Bowel: The stomach, duodenum, small bowel and colon are unremarkable. No acute inflammatory changes, mass lesions or obstructive findings. Descending and sigmoid colon diverticulosis but no findings for acute diverticulitis. The terminal ileum is normal. The appendix is surgically absent. Vascular/Lymphatic: Advanced atherosclerotic calcifications involving the aorta and iliac arteries. No aneurysm or dissection. The branch vessels are patent. The major venous structures are patent. Small scattered mesenteric and retroperitoneal lymph nodes  but no mass or overt adenopathy. Reproductive: The prostate gland and seminal vesicles are unremarkable. Other: Borderline right-sided pelvic lymph nodes are noted. 12.5 mm lateral external iliac lymph node on image number 73. 11 mm obturator region lymph node on image number 78. 10 mm external iliac lymph node on image number 77. No inguinal lymphadenopathy. Musculoskeletal: No significant bony findings. Lumbar fusion hardware noted at L4-5. There is advanced degenerate disc disease at L5-S1. Moderate bilateral hip joint degenerative changes. Review of the MIP images confirms the above findings. IMPRESSION: 1. No CT findings for acute pulmonary embolism. 2. Reflux of contrast material down the IVC may be due to tricuspid regurgitation. 3. No thoracic aortic aneurysm or dissection. Moderate atherosclerotic calcifications. 4. No acute pulmonary findings. Streaky bibasilar atelectasis and scarring. 5. No acute abdominal/pelvic findings, mass lesions or adenopathy. 6. Borderline right-sided external iliac lymph nodes. A repeat pelvic CT scan in 4 months may be helpful to re-evaluate. 7. Colonic diverticulosis without findings for acute diverticulitis. Electronically Signed   By: Marijo Sanes M.D.   On: 07/12/2018 17:26    EKG: Independently reviewed. Sinus rhythm, second degree Mobitz I AV block.   Assessment/Plan   1. Chest pain; CAD   - Presents with 1 day of pain in chest and abdomen, unlike his prior experiences with angina  - EKG similar to priors, troponin negative x2, CTA neg for PE  - Cardiology is consulting and much appreciated  - Pain more likely GI-related  - Continue cardiac monitoring for now, check a third troponin, continue ASA, check lipids and consider statin, treat suspected GI discomfort with PPI and as-needed GI cocktail    2. Asthma; OSA  - Patient follows with pulmonology for chronic dyspnea with severe OSA, possible asthma, likely OHS  - He is non-compliant with nocturnal BiPAP,  supplemental O2, and inhalers  - He is dyspneic on admission with no acute pulmonary disease on CTA, no fever or cough  - Continue supplemental O2 as-needed, encourage compliance with BiPAP qHS, resume albuterol nebs prn, mgmt of possible CHF per cardiology    3. Hyperbilirubinemia  - Total bilirubin is 2.2 in ED, mainly unconjugated, previously normal  - He presented with abd pain that resolved with IV PPI and analgesia in ED, abd exam benign  - On CT, there is no focal liver lesion, no CBD or intrahepatic ductal dilatation, and gallbladder is surgically absent   - Trend, check TSH and viral hepatitis panel, continue supportive care   4. AV block  - Appreciate cardiology consultation, recommending EP consult in am    DVT prophylaxis: Lovenox Code Status: Full  Family Communication: Grandson updated at bedside Consults called: Cardiology  Admission status: Observation     Vianne Bulls, MD [facility] Pager [phone]  If 7PM-7AM, please contact night-coverage [email or web address] Password [email or web address]  07/12/2018, 8:00 PM

## 2018-07-12 NOTE — Consult Note (Signed)
Cardiology Consultation:   Patient ID: [name] MRN: [id number]; DOB: Jun 29, 1945  Admit date: 07/12/2018 Date of Consult: 07/12/2018  Primary Care Provider: Maury Dus, MD Primary Cardiologist: Dr. Wynonia Lane Primary Electrophysiologist:  None   Patient Profile:   [name] is a 73 y.o. male with a hx of coronary artery disease w/ prior RCA stenting, prior Mobitz 1 second-degree AV block, hypertension, obstructive sleep apnea, obesity and asthma who is being seen today for the evaluation of chest pain and bradycardia at the request of Dr. Myna Lane, Internal Medicine.    History of Present Illness:   [name] is a 73 y.o. male with past medical history of coronary artery disease, prior Mobitz 1 second-degree AV block, hypertension, obstructive sleep apnea, obesity and asthma presenting to the ED w/ CC of chest pain. Also noted to be bradycardic.   He has been followed by Dr. Wynonia Lane. Last cardiac cath was in 2016 and he had PCI of the mid RCA w/ BMS. He was also noted to have mild nonobstructive dz in the mid LAD and 2nd marginal, treated medically. Last echo was in 2018. EF was normal at 60-65%. No significant valvular abnormalities.   He presents w/ CC of anterior chest pain and abdominal pain/ mid epigastric. Started yesterday. Also notes new exertional dyspnea. He was hypoxic on arrival and started on [place]. CT of chest  and CT of abdomen with contrast without any acute findings other than raising some concern for tricuspid valve abnormality.  No evidence of pulmonary embolism.  EKG shows bradycardia w/ HR in the upper 40s. Initial POC troponin is negative. WBC ct elevated at 12.6. H/H normal. Hgb 15.7. K 3.8. Scr 1.16. BP elevated in the 222L- 798X systolic in the ED.   Current chest discomfort is different from his previous angina which was described a severe retrosternal pressure ("elephant sitting on my chest") radiating to both arms associated with severe dyspnea ("like I had a  panic attack").  It is not worsened by walking or other types of physical activity, but he does become easily short of breath with walking (NYHA functional class 2-3).  He has chronic asymmetrical lower extremity edema, always a little worse in his left lower extremity.  Denies orthopnea or PND.  He has not experienced syncope.  Past Medical History:  Diagnosis Date  . Arthritis    "left shoulder" (02/09/2015)  . Blind left eye   . CAD (coronary artery disease)   . Closed fracture of lateral portion of right tibial plateau 11/01/2017  . Complication of anesthesia    "they give me too much anesthesia & had to put me on life support for a little bit w/gallbladder OR"  . Coronary artery disease   . Former smoker   . GERD (gastroesophageal reflux disease)   . Gout   . History of bleeding ulcers   . History of gout   . History of peptic ulcer disease   . Hyperlipidemia   . Hypertension   . Hypertensive heart disease without CHF   . Kidney stones   . Lumbar disc disease   . Myocardial infarction [facility]) 1995; 2007  . Obesity   . OSA (obstructive sleep apnea)   . Pneumonia X 2  . Prostate cancer ([place])   . Pulmonary nodule   . Second degree Mobitz I AV block     Past Surgical History:  Procedure Laterality Date  . APPENDECTOMY    . APPENDECTOMY  ~ 1985  . BACK  SURGERY    . CARDIAC CATHETERIZATION N/A 02/08/2015   Procedure: Left Heart Cath and Coronary Angiography;  Surgeon: Leonie Man, MD;  Location: [place] [facility];  Service: Cardiovascular;  Laterality: N/A;  . CARDIAC CATHETERIZATION N/A 02/08/2015   Procedure: Coronary Stent Intervention;  Surgeon: Leonie Man, MD;  Location: [place] [facility];  Service: Cardiovascular;  Laterality: N/A;  . CHOLECYSTECTOMY    . CORONARY ANGIOPLASTY WITH STENT PLACEMENT  ~ 1995; 2007   "1 stent; 2 stents"  . LAPAROSCOPIC CHOLECYSTECTOMY    . LITHOTRIPSY  X 1  . LUMBAR LAMINECTOMY    . ORIF TIBIA PLATEAU Right 11/05/2017   Procedure:  OPEN REDUCTION INTERNAL FIXATION (ORIF) TIBIAL PLATEAU;  Surgeon: Shona Needles, MD;  Location: [facility];  Service: Orthopedics;  Laterality: Right;  . POSTERIOR LUMBAR FUSION  2003?  . PROSTATE BIOPSY  2015     Home Medications:  Prior to Admission medications   Medication Sig Start Date End Date Taking? Authorizing Provider  acetaminophen (TYLENOL) 500 MG tablet Take 500 mg by mouth every 6 (six) hours as needed for moderate pain.    [provider]  albuterol (PROVENTIL) (2.5 MG/3ML) 0.083% nebulizer solution Take 3 mLs (2.5 mg total) by nebulization every 6 (six) hours as needed for wheezing or shortness of breath. 04/19/18   Brand Males, MD  allopurinol (ZYLOPRIM) 300 MG tablet Take 300 mg by mouth Daily.  11/07/11   [provider]  amLODipine (NORVASC) 10 MG tablet Take 10 mg by mouth Daily.  11/07/11   [provider]  aspirin EC 81 MG EC tablet Take 1 tablet (81 mg total) by mouth daily. 02/10/15   Jacolyn Reedy, MD  bisacodyl (DULCOLAX) 5 MG EC tablet Take 2 tablets (10 mg total) by mouth daily. 11/11/17 11/11/18  Georgette Shell, MD  hydrALAZINE (APRESOLINE) 50 MG tablet Take 1 tablet (50 mg total) by mouth 3 (three) times daily. 11/09/17   Georgette Shell, MD  neomycin-polymyxin-hydrocortisone (CORTISPORIN) OTIC solution Apply 1-2 drops to toe after soaking BID 04/22/18   Regal, Tamala Fothergill, DPM  polyethylene glycol (MIRALAX / GLYCOLAX) packet Take 17 g by mouth 2 (two) times daily. 11/09/17   Georgette Shell, MD  senna (SENOKOT) 8.6 MG TABS tablet Take 2 tablets (17.2 mg total) by mouth daily. 11/09/17   Georgette Shell, MD  Tiotropium Bromide Monohydrate (SPIRIVA RESPIMAT) 1.25 MCG/ACT AERS Inhale 2 puffs into the lungs daily. 10/19/17   Brand Males, MD    Inpatient Medications: Scheduled Meds: . aspirin  324 mg Oral Once  . morphine  2 mg Intravenous Once  . pantoprazole (PROTONIX) IV  40 mg Intravenous Once  . sodium chloride  flush  3 mL Intravenous Once   Continuous Infusions: . sodium chloride 75 mL/hr at 07/12/18 1637   PRN Meds:   Allergies:    Allergies  Allergen Reactions  . Prednisone     Social History:   Social History   Socioeconomic History  . Marital status: Married    Spouse name: Not on file  . Number of children: 2  . Years of education: Not on file  . Highest education level: Not on file  Occupational History  . Occupation: retired  Scientific laboratory technician  . Financial resource strain: Not on file  . Food insecurity:    Worry: Not on file    Inability: Not on file  . Transportation needs:    Medical: Not on file  Non-medical: Not on file  Tobacco Use  . Smoking status: Former Smoker    Packs/day: 3.00    Years: 35.00    Pack years: 105.00    Types: Cigarettes    Last attempt to quit: 06/17/1983    Years since quitting: 35.0  . Smokeless tobacco: Never Used  . Tobacco comment: "quit smoking cigarettes in 1985  Substance and Sexual Activity  . Alcohol use: Yes    Alcohol/week: 0.0 standard drinks    Comment: "I drank a whole lot when I was younger; nothing since 1990"  . Drug use: No  . Sexual activity: Never  Lifestyle  . Physical activity:    Days per week: Not on file    Minutes per session: Not on file  . Stress: Not on file  Relationships  . Social connections:    Talks on phone: Not on file    Gets together: Not on file    Attends religious service: Not on file    Active member of club or organization: Not on file    Attends meetings of clubs or organizations: Not on file    Relationship status: Not on file  . Intimate partner violence:    Fear of current or ex partner: Not on file    Emotionally abused: Not on file    Physically abused: Not on file    Forced sexual activity: Not on file  Other Topics Concern  . Not on file  Social History Narrative   ** Merged History Encounter **        Family History:    Family History  Problem Relation Age of Onset    . Hypertension Mother   . Coronary artery disease Mother   . Aneurysm Father   . Hypertension Father   . COPD Sister   . Sleep apnea Unknown        3 siblings     ROS:  Please see the history of present illness.   All other ROS reviewed and negative.     Physical Exam/Data:   Vitals:   07/12/18 1540 07/12/18 1600 07/12/18 1638 07/12/18 1800  BP: (!) 166/82 (!) 159/69 (!) 158/74 (!) 153/71  Pulse: (!) 54 65 60 (!) 44  Resp: 19 20 (!) 24 (!) 30  Temp:      TempSrc:      SpO2: 96% 93%  94%   No intake or output data in the 24 hours ending 07/12/18 1951 Last 3 Weights 04/19/2018 11/01/2017 10/19/2017  Weight (lbs) 318 lb 312 lb 314 lb 12.8 oz  Weight (kg) 144.244 kg 141.522 kg 142.792 kg     There is no height or weight on file to calculate BMI.  General:  Well nourished, well developed, in no acute distress, morbidly obese HEENT: normal Lymph: no adenopathy Neck: no JVD Endocrine:  No thryomegaly Vascular: No carotid bruits; FA pulses 2+ bilaterally without bruits  Cardiac:  normal S1, S2; RRR; no murmur  Lungs:  clear to auscultation bilaterally, no wheezing, rhonchi or rales  Abd: soft, nontender, no hepatomegaly  Ext: 1-2+ ankle edema Musculoskeletal:  No deformities, BUE and BLE strength normal and equal Skin: warm and dry  Neuro:  CNs 2-12 intact (except very HOH), no focal abnormalities noted Psych:  Normal affect   EKG:  The EKG was personally reviewed and demonstrates:  Bradycardia 49 bpm Telemetry:  Telemetry was personally reviewed and demonstrates: Sinus rhythm with second-degree atrioventricular block, Mobitz type I and ventricular rates in  the 45-60 range.  There is always narrow QRS complex and there is no evidence of high-grade AV block.  Relevant CV Studies: LHC 01/2015 Procedures   Coronary Stent Intervention  Left Heart Cath and Coronary Angiography  Conclusion   1. Prox RCA to Mid RCA lesion, 95% stenosed. A REBEL 4.0 mm x 20 mm bare metal stent  was placed. There is a 0% residual stenosis post intervention. 2. Ost LAD to Mid LAD lesion, 30% stenosed. The lesion was previously treated with a bare metal stent greater than two years ago. Mid LAD lesion, 30% stenosed. beyond the stent 3. 2nd Mrg-2 lesion, 10% stenosed. The lesion was previously treated with a bare metal stent one to two years ago. 4. The left ventricular systolic function is normal. 5. Moderately elevated LVEDP with systemic HTN    Successful PCI of mid RCA with bare metal stent with excellent result    2D Echo 03/2017 Study Conclusions  - Left ventricle: The cavity size was normal. There was moderate   concentric hypertrophy. Systolic function was normal. The   estimated ejection fraction was in the range of 60% to 65%. Wall   motion was normal; there were no regional wall motion   abnormalities. - Aortic valve: Transvalvular velocity was within the normal range.   There was no stenosis. There was no regurgitation. - Mitral valve: Transvalvular velocity was within the normal range.   There was no evidence for stenosis. There was trivial   regurgitation. - Left atrium: The atrium was moderately dilated. - Right ventricle: The cavity size was normal. Wall thickness was   normal. Systolic function was normal. - Tricuspid valve: There was mild regurgitation. - Pulmonary arteries: Systolic pressure was within the normal   range. PA peak pressure: 36 mm Hg (S). - Pericardium, extracardiac: A trivial pericardial effusion was   identified.  Impressions:  - Endocardial border definition is poor, but systolic function   appears to be normal. Laboratory Data:  Chemistry Recent Labs  Lab 07/12/18 1153  NA 137  K 3.8  CL 98  CO2 30  GLUCOSE 97  BUN 14  CREATININE 1.16  CALCIUM 10.3  GFRNONAA >60  GFRAA >60  ANIONGAP 9    Recent Labs  Lab 07/12/18 1634  PROT 7.8  ALBUMIN 3.7  AST 21  ALT 20  ALKPHOS 53  BILITOT 2.2*   Hematology Recent  Labs  Lab 07/12/18 1153  WBC 12.6*  RBC 5.39  HGB 15.7  HCT 49.1  MCV 91.1  MCH 29.1  MCHC 32.0  RDW 13.4  PLT 214   Cardiac EnzymesNo results for input(s): TROPONINI in the last 168 hours.  Recent Labs  Lab 07/12/18 1202 07/12/18 1643  TROPIPOC 0.01 0.01    BNPNo results for input(s): BNP, PROBNP in the last 168 hours.  DDimer No results for input(s): DDIMER in the last 168 hours.  Radiology/Studies:  Dg Chest 2 View  Result Date: 07/12/2018 CLINICAL DATA:  Chest pain. Cough and shortness of breath for the last 2 days. EXAM: CHEST - 2 VIEW COMPARISON:  One-view chest x-ray 11/01/2017 FINDINGS: The heart is enlarged. Mild pulmonary vascular congestion is present. There are no effusions. No focal airspace disease is present. Visualized soft tissues and bony thorax are unremarkable. IMPRESSION: 1. Cardiomegaly and mild pulmonary vascular congestion without frank edema. 2. No focal airspace disease. Electronically Signed   By: San Morelle M.D.   On: 07/12/2018 12:33   Ct Angio Chest Pe W/cm &/or  Wo Cm  Result Date: 07/12/2018 CLINICAL DATA:  Chest and abdominal pain with shortness of breath and dizziness. EXAM: CT ANGIOGRAPHY CHEST CT ABDOMEN AND PELVIS WITH CONTRAST TECHNIQUE: Multidetector CT imaging of the chest was performed using the standard protocol during bolus administration of intravenous contrast. Multiplanar CT image reconstructions and MIPs were obtained to evaluate the vascular anatomy. Multidetector CT imaging of the abdomen and pelvis was performed using the standard protocol during bolus administration of intravenous contrast. CONTRAST:  187mL ISOVUE-370 IOPAMIDOL (ISOVUE-370) INJECTION 76% COMPARISON:  Chest CT 04/15/2017 FINDINGS: CTA CHEST FINDINGS Cardiovascular: The heart is upper limits of normal in size and stable. Stable mild tortuosity, ectasia and calcification of the thoracic aorta. The branch vessels are patent. Stable coronary artery calcifications  and coronary artery stents. The pulmonary arterial tree is fairly well opacified. No filling defects to suggest pulmonary embolism. Mediastinum/Nodes: No mediastinal or hilar mass or adenopathy. The esophagus is unremarkable. Lungs/Pleura: Limited by breathing motion artifact but no obvious infiltrates or pulmonary lesions. Patchy basilar subsegmental atelectasis and some scarring changes. No pleural effusion. Musculoskeletal: No significant bony findings. Review of the MIP images confirms the above findings. CT ABDOMEN and PELVIS FINDINGS Hepatobiliary: No focal hepatic lesions or intrahepatic biliary dilatation. The gallbladder is surgically absent. No common bile duct dilatation. Pancreas: No mass, inflammation or ductal dilatation. Spleen: Normal size.  No focal lesions. Adrenals/Urinary Tract: The adrenal glands and kidneys are unremarkable and stable. No worrisome renal lesions or hydronephrosis. The bladder is unremarkable. Stomach/Bowel: The stomach, duodenum, small bowel and colon are unremarkable. No acute inflammatory changes, mass lesions or obstructive findings. Descending and sigmoid colon diverticulosis but no findings for acute diverticulitis. The terminal ileum is normal. The appendix is surgically absent. Vascular/Lymphatic: Advanced atherosclerotic calcifications involving the aorta and iliac arteries. No aneurysm or dissection. The branch vessels are patent. The major venous structures are patent. Small scattered mesenteric and retroperitoneal lymph nodes but no mass or overt adenopathy. Reproductive: The prostate gland and seminal vesicles are unremarkable. Other: Borderline right-sided pelvic lymph nodes are noted. 12.5 mm lateral external iliac lymph node on image number 73. 11 mm obturator region lymph node on image number 78. 10 mm external iliac lymph node on image number 77. No inguinal lymphadenopathy. Musculoskeletal: No significant bony findings. Lumbar fusion hardware noted at L4-5.  There is advanced degenerate disc disease at L5-S1. Moderate bilateral hip joint degenerative changes. Review of the MIP images confirms the above findings. IMPRESSION: 1. No CT findings for acute pulmonary embolism. 2. Reflux of contrast material down the IVC may be due to tricuspid regurgitation. 3. No thoracic aortic aneurysm or dissection. Moderate atherosclerotic calcifications. 4. No acute pulmonary findings. Streaky bibasilar atelectasis and scarring. 5. No acute abdominal/pelvic findings, mass lesions or adenopathy. 6. Borderline right-sided external iliac lymph nodes. A repeat pelvic CT scan in 4 months may be helpful to re-evaluate. 7. Colonic diverticulosis without findings for acute diverticulitis. Electronically Signed   By: Marijo Sanes M.D.   On: 07/12/2018 17:26   Ct Abdomen Pelvis W Contrast  Result Date: 07/12/2018 CLINICAL DATA:  Chest and abdominal pain with shortness of breath and dizziness. EXAM: CT ANGIOGRAPHY CHEST CT ABDOMEN AND PELVIS WITH CONTRAST TECHNIQUE: Multidetector CT imaging of the chest was performed using the standard protocol during bolus administration of intravenous contrast. Multiplanar CT image reconstructions and MIPs were obtained to evaluate the vascular anatomy. Multidetector CT imaging of the abdomen and pelvis was performed using the standard protocol during bolus administration of  intravenous contrast. CONTRAST:  188mL ISOVUE-370 IOPAMIDOL (ISOVUE-370) INJECTION 76% COMPARISON:  Chest CT 04/15/2017 FINDINGS: CTA CHEST FINDINGS Cardiovascular: The heart is upper limits of normal in size and stable. Stable mild tortuosity, ectasia and calcification of the thoracic aorta. The branch vessels are patent. Stable coronary artery calcifications and coronary artery stents. The pulmonary arterial tree is fairly well opacified. No filling defects to suggest pulmonary embolism. Mediastinum/Nodes: No mediastinal or hilar mass or adenopathy. The esophagus is unremarkable.  Lungs/Pleura: Limited by breathing motion artifact but no obvious infiltrates or pulmonary lesions. Patchy basilar subsegmental atelectasis and some scarring changes. No pleural effusion. Musculoskeletal: No significant bony findings. Review of the MIP images confirms the above findings. CT ABDOMEN and PELVIS FINDINGS Hepatobiliary: No focal hepatic lesions or intrahepatic biliary dilatation. The gallbladder is surgically absent. No common bile duct dilatation. Pancreas: No mass, inflammation or ductal dilatation. Spleen: Normal size.  No focal lesions. Adrenals/Urinary Tract: The adrenal glands and kidneys are unremarkable and stable. No worrisome renal lesions or hydronephrosis. The bladder is unremarkable. Stomach/Bowel: The stomach, duodenum, small bowel and colon are unremarkable. No acute inflammatory changes, mass lesions or obstructive findings. Descending and sigmoid colon diverticulosis but no findings for acute diverticulitis. The terminal ileum is normal. The appendix is surgically absent. Vascular/Lymphatic: Advanced atherosclerotic calcifications involving the aorta and iliac arteries. No aneurysm or dissection. The branch vessels are patent. The major venous structures are patent. Small scattered mesenteric and retroperitoneal lymph nodes but no mass or overt adenopathy. Reproductive: The prostate gland and seminal vesicles are unremarkable. Other: Borderline right-sided pelvic lymph nodes are noted. 12.5 mm lateral external iliac lymph node on image number 73. 11 mm obturator region lymph node on image number 78. 10 mm external iliac lymph node on image number 77. No inguinal lymphadenopathy. Musculoskeletal: No significant bony findings. Lumbar fusion hardware noted at L4-5. There is advanced degenerate disc disease at L5-S1. Moderate bilateral hip joint degenerative changes. Review of the MIP images confirms the above findings. IMPRESSION: 1. No CT findings for acute pulmonary embolism. 2. Reflux  of contrast material down the IVC may be due to tricuspid regurgitation. 3. No thoracic aortic aneurysm or dissection. Moderate atherosclerotic calcifications. 4. No acute pulmonary findings. Streaky bibasilar atelectasis and scarring. 5. No acute abdominal/pelvic findings, mass lesions or adenopathy. 6. Borderline right-sided external iliac lymph nodes. A repeat pelvic CT scan in 4 months may be helpful to re-evaluate. 7. Colonic diverticulosis without findings for acute diverticulitis. Electronically Signed   By: Marijo Sanes M.D.   On: 07/12/2018 17:26    Assessment and Plan:   [name] is a 73 y.o. male with a hx of coronary artery disease w/p prior RCA stents, prior Mobitz 1 second-degree AV block, hypertension, obstructive sleep apnea, obesity and asthma who is being seen today for the evaluation of chest pain and bradycardia at the request of Dr. Myna Lane, Internal Medicine.   1. Chest Pain and  Bradycardia: pt w/ known CAD w/ prior RCA intervention w/ stents placed in 2016. He now presents with anterior CP and bradycardia. HR in the 40s and symptomatic. He is not on any AVN blocking agents. K is WNL. Check Mg and TSH.  ? Recurrent obstructive RCA disease. Initial troponin in the ED is negative. He will be admitted by IM. Continue to cycle troponins x 3 for r/o. Echo in the AM. He will need to be restudied prior to consideration for PPM. He will require repeat LHC to reassess patency of RCA  stents and r/o other obstructive CAD (was noted to have mild LAD disease in 2016). He will need a f/u BMP in the AM to insure Scr is stable prior to cath. Scr was 1.16 in the ED and he got contrast w/ CT of chest and abdomen.   For questions or updates, please contact [place] Please consult [email or web address] for contact info under     Signed, Lyda Jester, PA-C  07/12/2018 7:51 PM   I have seen and examined the patient along with Lyda Jester, PA-C .  I have reviewed the chart, notes and  new data.  I agree with PA's note.  His grandson [name] is at the bedside.  He has medical power of attorney.  Key new complaints: His chest pain is nonexertional, different from previous angina , radiates all the way to his lower abdomen, all in all not consistent with angina pectoris.  He does have shortness of breath with activity that is recently worse from his baseline. Key examination changes: Extremely hard of hearing, severely obese, clear lung exam, probable mild JVD (difficult to evaluate), irregular bradycardic rhythm, otherwise normal cardiac exam, mildly distended-mildly tender abdomen, 2+ left pretibial edema, 1+ right ankle edema with bilateral prominent varicose veins Key new findings / data: Troponin undetectable x2 despite symptoms going on for 24 hours, ECG with sinus rhythm with second-degree atrioventricular block Mobitz type I but without acute ischemic abnormalities, note reflux of contrast material into the inferior vena cava which could be a sign of right heart failure or simply a consequence of diastolic tricuspid regurgitation in the setting of second-degree AV block.  PLAN: 1. CAD: Although he does have well-established CAD, current chest pain is nonanginal, there are no ischemic ECG changes, 2 consecutive troponin values are normal despite more than 24 hours of symptoms.  Suspect his symptoms are more likely to have a gastrointestinal etiology, rather than coronary insufficiency.  Nevertheless, it is possible that he has had recurrent stenosis in the right coronary artery or disease progression in other locations.  He may require coronary angiography, but this is not urgent.  Taking aspirin.  No indication for intravenous heparin at this time.  Beta-blockers are contraindicated.  Surprisingly not on a statin, should probably start one. Ordered lipid profile in AM. 2. 2nd deg AVB, MT1: This is a longstanding problem that precedes right coronary artery stenosis.  He is  not on any medications with negative chronotropic effect.  It is possible that his exertional dyspnea and fatigue are partly related to bradycardia and he probably needs a pacemaker.  Would be wise to reevaluate his coronary system first.  Recommend consultation by electrophysiology tomorrow. 3. Dyspnea: Is likely multifactorial with significant contribution from severe obesity, OSA and COPD (see notes from outpatient follow-up with pulmonology), but also possibly a contribution of diastolic heart failure.  There are some signs of mild hypervolemia, which could also be related to peripheral venous insufficiency.  He is definitely not in florid heart failure. A relatively recent echo showed normal left ventricular systolic function.  Diastolic function will be difficult to assess by echo due to his arrhythmia.  Note that his BNP is very unimpressively elevated at 190 (only values available for comparison are from 2010).  There is no immediate indication for diuretics at this time.   Sanda Klein, MD, [place] [phone] 07/12/2018, 8:15 PM

## 2018-07-12 NOTE — ED Triage Notes (Signed)
Pt endorses abd pain going up into the center of the chest and through to the back with shob, dizziness. Has hx of 4 stents.

## 2018-07-12 NOTE — ED Provider Notes (Addendum)
[facility] EMERGENCY DEPARTMENT Provider Note   CSN: [id number] Arrival date & time: 07/12/18  1145     History   Chief Complaint Chief Complaint  Patient presents with  . Chest Pain  . Abdominal Pain    HPI Billy CAIL is a 73 y.o. male.  Patient with a complaint of shortness of breath, chest pain and abdominal pain.  All onset yesterday at 1 in the afternoon.  Patient has a known history of coronary disease with multiple stents.  Patient somewhat noncompliant also has known sleep apnea supposed to be using oxygen at home but does not follow through on that.  Also followed by pulmonary medicine.  Patient states that the anterior chest pain and the abdominal pain which is mostly epigastric is been constant since yesterday.  Patient thought that he had constipation.  Because he has not had a bowel movement for a few days.     Past Medical History:  Diagnosis Date  . Arthritis    "left shoulder" (02/09/2015)  . Blind left eye   . CAD (coronary artery disease)   . Closed fracture of lateral portion of right tibial plateau 11/01/2017  . Complication of anesthesia    "they give me too much anesthesia & had to put me on life support for a little bit w/gallbladder OR"  . Coronary artery disease   . Former smoker   . GERD (gastroesophageal reflux disease)   . Gout   . History of bleeding ulcers   . History of gout   . History of peptic ulcer disease   . Hyperlipidemia   . Hypertension   . Hypertensive heart disease without CHF   . Kidney stones   . Lumbar disc disease   . Myocardial infarction [facility]) 1995; 2007  . Obesity   . OSA (obstructive sleep apnea)   . Pneumonia X 2  . Prostate cancer ([place])   . Pulmonary nodule   . Second degree Mobitz I AV block     Patient Active Problem List   Diagnosis Date Noted  . Chest pain 07/12/2018  . Chronic respiratory failure with hypoxia ([place]) 07/12/2018  . Abnormal EKG 07/12/2018  . Closed fracture of  medial plateau of right tibia   . Closed fracture of lateral portion of right tibial plateau 11/01/2017  . COPD (chronic obstructive pulmonary disease) ([place]) 05/06/2017  . History of peptic ulcer disease   . Lumbar disc disease   . Second degree Mobitz I AV block   . Hyperlipidemia 09/21/2014  . Pulmonary nodule   . OSA (obstructive sleep apnea) 04/12/2009  . Obesity (BMI 30-39.9)   . CAD (coronary artery disease) 04/11/2009  . Hypertensive heart disease without CHF   . GERD     Past Surgical History:  Procedure Laterality Date  . APPENDECTOMY    . APPENDECTOMY  ~ 1985  . BACK SURGERY    . CARDIAC CATHETERIZATION N/A 02/08/2015   Procedure: Left Heart Cath and Coronary Angiography;  Surgeon: Leonie Man, MD;  Location: [facility];  Service: Cardiovascular;  Laterality: N/A;  . CARDIAC CATHETERIZATION N/A 02/08/2015   Procedure: Coronary Stent Intervention;  Surgeon: Leonie Man, MD;  Location: [facility];  Service: Cardiovascular;  Laterality: N/A;  . CHOLECYSTECTOMY    . CORONARY ANGIOPLASTY WITH STENT PLACEMENT  ~ 1995; 2007   "1 stent; 2 stents"  . LAPAROSCOPIC CHOLECYSTECTOMY    . LITHOTRIPSY  X 1  . LUMBAR LAMINECTOMY    .  ORIF TIBIA PLATEAU Right 11/05/2017   Procedure: OPEN REDUCTION INTERNAL FIXATION (ORIF) TIBIAL PLATEAU;  Surgeon: Shona Needles, MD;  Location: [facility];  Service: Orthopedics;  Laterality: Right;  . POSTERIOR LUMBAR FUSION  2003?  . PROSTATE BIOPSY  2015        Home Medications    Prior to Admission medications   Medication Sig Start Date End Date Taking? Authorizing Provider  acetaminophen (TYLENOL) 500 MG tablet Take 500 mg by mouth every 6 (six) hours as needed for moderate pain.    [provider]  albuterol (PROVENTIL) (2.5 MG/3ML) 0.083% nebulizer solution Take 3 mLs (2.5 mg total) by nebulization every 6 (six) hours as needed for wheezing or shortness of breath. 04/19/18   Brand Males, MD  allopurinol  (ZYLOPRIM) 300 MG tablet Take 300 mg by mouth Daily.  11/07/11   [provider]  amLODipine (NORVASC) 10 MG tablet Take 10 mg by mouth Daily.  11/07/11   [provider]  aspirin EC 81 MG EC tablet Take 1 tablet (81 mg total) by mouth daily. 02/10/15   Jacolyn Reedy, MD  bisacodyl (DULCOLAX) 5 MG EC tablet Take 2 tablets (10 mg total) by mouth daily. 11/11/17 11/11/18  Georgette Shell, MD  hydrALAZINE (APRESOLINE) 50 MG tablet Take 1 tablet (50 mg total) by mouth 3 (three) times daily. 11/09/17   Georgette Shell, MD  neomycin-polymyxin-hydrocortisone (CORTISPORIN) OTIC solution Apply 1-2 drops to toe after soaking BID 04/22/18   Regal, Tamala Fothergill, DPM  polyethylene glycol (MIRALAX / GLYCOLAX) packet Take 17 g by mouth 2 (two) times daily. 11/09/17   Georgette Shell, MD  senna (SENOKOT) 8.6 MG TABS tablet Take 2 tablets (17.2 mg total) by mouth daily. 11/09/17   Georgette Shell, MD  Tiotropium Bromide Monohydrate (SPIRIVA RESPIMAT) 1.25 MCG/ACT AERS Inhale 2 puffs into the lungs daily. 10/19/17   Brand Males, MD    Family History Family History  Problem Relation Age of Onset  . Hypertension Mother   . Coronary artery disease Mother   . Aneurysm Father   . Hypertension Father   . COPD Sister   . Sleep apnea Unknown        3 siblings    Social History Social History   Tobacco Use  . Smoking status: Former Smoker    Packs/day: 3.00    Years: 35.00    Pack years: 105.00    Types: Cigarettes    Last attempt to quit: 06/17/1983    Years since quitting: 35.0  . Smokeless tobacco: Never Used  . Tobacco comment: "quit smoking cigarettes in 1985  Substance Use Topics  . Alcohol use: Yes    Alcohol/week: 0.0 standard drinks    Comment: "I drank a whole lot when I was younger; nothing since 1990"  . Drug use: No     Allergies   Prednisone   Review of Systems Review of Systems  Constitutional: Negative for chills and fever.  HENT: Negative for  rhinorrhea and sore throat.   Eyes: Negative for visual disturbance.  Respiratory: Positive for shortness of breath. Negative for cough.   Cardiovascular: Positive for chest pain. Negative for leg swelling.  Gastrointestinal: Positive for abdominal pain. Negative for diarrhea, nausea and vomiting.  Genitourinary: Negative for dysuria.  Musculoskeletal: Negative for back pain and neck pain.  Skin: Negative for rash.  Neurological: Negative for dizziness, light-headedness and headaches.  Hematological: Does not bruise/bleed easily.  Psychiatric/Behavioral: Negative for confusion.  Physical Exam Updated Vital Signs BP (!) 153/71   Pulse (!) 44   Temp 98.4 F (36.9 C) (Oral)   Resp (!) 30   SpO2 94%   Physical Exam Vitals signs and nursing note reviewed.  Constitutional:      General: He is not in acute distress.    Appearance: He is well-developed.  HENT:     Head: Normocephalic and atraumatic.     Mouth/Throat:     Mouth: Mucous membranes are moist.  Eyes:     Conjunctiva/sclera: Conjunctivae normal.  Neck:     Musculoskeletal: Normal range of motion and neck supple.  Cardiovascular:     Rate and Rhythm: Normal rate and regular rhythm.     Heart sounds: No murmur.  Pulmonary:     Effort: Pulmonary effort is normal. No respiratory distress.     Breath sounds: Normal breath sounds.  Abdominal:     General: Bowel sounds are normal. There is distension.     Palpations: Abdomen is soft.     Tenderness: There is no abdominal tenderness.  Musculoskeletal:        General: Swelling present.  Skin:    General: Skin is warm and dry.  Neurological:     General: No focal deficit present.     Mental Status: He is alert and oriented to person, place, and time.      ED Treatments / Results  Labs (all labs ordered are listed, but only abnormal results are displayed) Labs Reviewed  CBC - Abnormal; Notable for the following components:      Result Value   WBC 12.6 (*)     All other components within normal limits  HEPATIC FUNCTION PANEL - Abnormal; Notable for the following components:   Total Bilirubin 2.2 (*)    Bilirubin, Direct 0.4 (*)    Indirect Bilirubin 1.8 (*)    All other components within normal limits  LIPASE, BLOOD - Abnormal; Notable for the following components:   Lipase 58 (*)    All other components within normal limits  BASIC METABOLIC PANEL  URINALYSIS, ROUTINE W REFLEX MICROSCOPIC  I-STAT TROPONIN, ED  I-STAT TROPONIN, ED    EKG EKG Interpretation  Date/Time:  Monday July 12 2018 11:50:53 EST Ventricular Rate:  47 PR Interval:    QRS Duration: 78 QT Interval:  432 QTC Calculation: 382 R Axis:   53 Text Interpretation:  Atrial fibrillation with slow ventricular response with premature ventricular or aberrantly conducted complexes ST & T wave abnormality, consider anterior ischemia Abnormal ECG Confirmed by Fredia Sorrow [phone]) on 07/12/2018 3:45:47 PM   Radiology Dg Chest 2 View  Result Date: 07/12/2018 CLINICAL DATA:  Chest pain. Cough and shortness of breath for the last 2 days. EXAM: CHEST - 2 VIEW COMPARISON:  One-view chest x-ray 11/01/2017 FINDINGS: The heart is enlarged. Mild pulmonary vascular congestion is present. There are no effusions. No focal airspace disease is present. Visualized soft tissues and bony thorax are unremarkable. IMPRESSION: 1. Cardiomegaly and mild pulmonary vascular congestion without frank edema. 2. No focal airspace disease. Electronically Signed   By: San Morelle M.D.   On: 07/12/2018 12:33   Ct Angio Chest Pe W/cm &/or Wo Cm  Result Date: 07/12/2018 CLINICAL DATA:  Chest and abdominal pain with shortness of breath and dizziness. EXAM: CT ANGIOGRAPHY CHEST CT ABDOMEN AND PELVIS WITH CONTRAST TECHNIQUE: Multidetector CT imaging of the chest was performed using the standard protocol during bolus administration of intravenous contrast. Multiplanar CT  image reconstructions and MIPs were  obtained to evaluate the vascular anatomy. Multidetector CT imaging of the abdomen and pelvis was performed using the standard protocol during bolus administration of intravenous contrast. CONTRAST:  135mL ISOVUE-370 IOPAMIDOL (ISOVUE-370) INJECTION 76% COMPARISON:  Chest CT 04/15/2017 FINDINGS: CTA CHEST FINDINGS Cardiovascular: The heart is upper limits of normal in size and stable. Stable mild tortuosity, ectasia and calcification of the thoracic aorta. The branch vessels are patent. Stable coronary artery calcifications and coronary artery stents. The pulmonary arterial tree is fairly well opacified. No filling defects to suggest pulmonary embolism. Mediastinum/Nodes: No mediastinal or hilar mass or adenopathy. The esophagus is unremarkable. Lungs/Pleura: Limited by breathing motion artifact but no obvious infiltrates or pulmonary lesions. Patchy basilar subsegmental atelectasis and some scarring changes. No pleural effusion. Musculoskeletal: No significant bony findings. Review of the MIP images confirms the above findings. CT ABDOMEN and PELVIS FINDINGS Hepatobiliary: No focal hepatic lesions or intrahepatic biliary dilatation. The gallbladder is surgically absent. No common bile duct dilatation. Pancreas: No mass, inflammation or ductal dilatation. Spleen: Normal size.  No focal lesions. Adrenals/Urinary Tract: The adrenal glands and kidneys are unremarkable and stable. No worrisome renal lesions or hydronephrosis. The bladder is unremarkable. Stomach/Bowel: The stomach, duodenum, small bowel and colon are unremarkable. No acute inflammatory changes, mass lesions or obstructive findings. Descending and sigmoid colon diverticulosis but no findings for acute diverticulitis. The terminal ileum is normal. The appendix is surgically absent. Vascular/Lymphatic: Advanced atherosclerotic calcifications involving the aorta and iliac arteries. No aneurysm or dissection. The branch vessels are patent. The major venous  structures are patent. Small scattered mesenteric and retroperitoneal lymph nodes but no mass or overt adenopathy. Reproductive: The prostate gland and seminal vesicles are unremarkable. Other: Borderline right-sided pelvic lymph nodes are noted. 12.5 mm lateral external iliac lymph node on image number 73. 11 mm obturator region lymph node on image number 78. 10 mm external iliac lymph node on image number 77. No inguinal lymphadenopathy. Musculoskeletal: No significant bony findings. Lumbar fusion hardware noted at L4-5. There is advanced degenerate disc disease at L5-S1. Moderate bilateral hip joint degenerative changes. Review of the MIP images confirms the above findings. IMPRESSION: 1. No CT findings for acute pulmonary embolism. 2. Reflux of contrast material down the IVC may be due to tricuspid regurgitation. 3. No thoracic aortic aneurysm or dissection. Moderate atherosclerotic calcifications. 4. No acute pulmonary findings. Streaky bibasilar atelectasis and scarring. 5. No acute abdominal/pelvic findings, mass lesions or adenopathy. 6. Borderline right-sided external iliac lymph nodes. A repeat pelvic CT scan in 4 months may be helpful to re-evaluate. 7. Colonic diverticulosis without findings for acute diverticulitis. Electronically Signed   By: Marijo Sanes M.D.   On: 07/12/2018 17:26   Ct Abdomen Pelvis W Contrast  Result Date: 07/12/2018 CLINICAL DATA:  Chest and abdominal pain with shortness of breath and dizziness. EXAM: CT ANGIOGRAPHY CHEST CT ABDOMEN AND PELVIS WITH CONTRAST TECHNIQUE: Multidetector CT imaging of the chest was performed using the standard protocol during bolus administration of intravenous contrast. Multiplanar CT image reconstructions and MIPs were obtained to evaluate the vascular anatomy. Multidetector CT imaging of the abdomen and pelvis was performed using the standard protocol during bolus administration of intravenous contrast. CONTRAST:  141mL ISOVUE-370 IOPAMIDOL  (ISOVUE-370) INJECTION 76% COMPARISON:  Chest CT 04/15/2017 FINDINGS: CTA CHEST FINDINGS Cardiovascular: The heart is upper limits of normal in size and stable. Stable mild tortuosity, ectasia and calcification of the thoracic aorta. The branch vessels are patent. Stable coronary artery  calcifications and coronary artery stents. The pulmonary arterial tree is fairly well opacified. No filling defects to suggest pulmonary embolism. Mediastinum/Nodes: No mediastinal or hilar mass or adenopathy. The esophagus is unremarkable. Lungs/Pleura: Limited by breathing motion artifact but no obvious infiltrates or pulmonary lesions. Patchy basilar subsegmental atelectasis and some scarring changes. No pleural effusion. Musculoskeletal: No significant bony findings. Review of the MIP images confirms the above findings. CT ABDOMEN and PELVIS FINDINGS Hepatobiliary: No focal hepatic lesions or intrahepatic biliary dilatation. The gallbladder is surgically absent. No common bile duct dilatation. Pancreas: No mass, inflammation or ductal dilatation. Spleen: Normal size.  No focal lesions. Adrenals/Urinary Tract: The adrenal glands and kidneys are unremarkable and stable. No worrisome renal lesions or hydronephrosis. The bladder is unremarkable. Stomach/Bowel: The stomach, duodenum, small bowel and colon are unremarkable. No acute inflammatory changes, mass lesions or obstructive findings. Descending and sigmoid colon diverticulosis but no findings for acute diverticulitis. The terminal ileum is normal. The appendix is surgically absent. Vascular/Lymphatic: Advanced atherosclerotic calcifications involving the aorta and iliac arteries. No aneurysm or dissection. The branch vessels are patent. The major venous structures are patent. Small scattered mesenteric and retroperitoneal lymph nodes but no mass or overt adenopathy. Reproductive: The prostate gland and seminal vesicles are unremarkable. Other: Borderline right-sided pelvic  lymph nodes are noted. 12.5 mm lateral external iliac lymph node on image number 73. 11 mm obturator region lymph node on image number 78. 10 mm external iliac lymph node on image number 77. No inguinal lymphadenopathy. Musculoskeletal: No significant bony findings. Lumbar fusion hardware noted at L4-5. There is advanced degenerate disc disease at L5-S1. Moderate bilateral hip joint degenerative changes. Review of the MIP images confirms the above findings. IMPRESSION: 1. No CT findings for acute pulmonary embolism. 2. Reflux of contrast material down the IVC may be due to tricuspid regurgitation. 3. No thoracic aortic aneurysm or dissection. Moderate atherosclerotic calcifications. 4. No acute pulmonary findings. Streaky bibasilar atelectasis and scarring. 5. No acute abdominal/pelvic findings, mass lesions or adenopathy. 6. Borderline right-sided external iliac lymph nodes. A repeat pelvic CT scan in 4 months may be helpful to re-evaluate. 7. Colonic diverticulosis without findings for acute diverticulitis. Electronically Signed   By: Marijo Sanes M.D.   On: 07/12/2018 17:26    Procedures Procedures (including critical care time)  Medications Ordered in ED Medications  sodium chloride flush (NS) 0.9 % injection 3 mL (3 mLs Intravenous Not Given 07/12/18 1546)  0.9 %  sodium chloride infusion ( Intravenous New Bag/Given 07/12/18 1637)  pantoprazole (PROTONIX) injection 40 mg (has no administration in time range)  aspirin chewable tablet 324 mg (has no administration in time range)  morphine 2 MG/ML injection 2 mg (has no administration in time range)  ondansetron (ZOFRAN) injection 4 mg (4 mg Intravenous Given 07/12/18 1638)  iopamidol (ISOVUE-370) 76 % injection 100 mL (100 mLs Intravenous Contrast Given 07/12/18 1708)     Initial Impression / Assessment and Plan / ED Course  I have reviewed the triage vital signs and the nursing notes.  Pertinent labs & imaging results that were available during  my care of the patient were reviewed by me and considered in my medical decision making (see chart for details).    Patient presenting with multiple complaints.  Patient with onset of anterior chest pain as well as epigastric abdominal pain is about 1300 yesterday.  Patient thought he had constipation.  Patient also presenting here with exertional shortness of breath and some hypoxia.  Is doing  better on 4 L of oxygen.  Patient does have oxygen at home but is noncompliant on it.  Patient is also supposed to be on CPAP at home but he rarely uses it.  Extensive work-up here initial troponin was negative lipase slightly elevated at 58.  Bilirubin elevated 2.2.  LFTs otherwise negative.  Patient's repeat troponin was also negative.  But the pain has persisted.  CT chest angios and CT of abdomen with contrast without any acute findings other than raising some concern for tricuspid valve abnormality.  Certainly no evidence of pulmonary embolism.  Patient with known history of coronary artery disease has 4 stents.  Reassuring that troponins x2 are negative.  In addition patient with a bradycardic rhythm.  May be Mobitz type II.  Looks a little familiar from past once.  Do not think this is new.  However of significance.  Will have cardiology review his EKG.  Patient is followed by [facility] physicians.  Will discuss with hospitalist for admission.   Final Clinical Impressions(s) / ED Diagnoses   Final diagnoses:  Hypoxia  Precordial pain  Generalized abdominal pain  Heart block AV second degree    ED Discharge Orders    None       Fredia Sorrow, MD 07/12/18 1949  Patient seen by cardiology.  They feel it is a second-degree AV block probably type I.  But they are going to recommend a pacemaker but not emergently.  They will put a formal consult on.    Fredia Sorrow, MD 07/12/18 2025

## 2018-07-12 NOTE — Progress Notes (Signed)
Placed patient on bipap with oxygen set at 5lpm.

## 2018-07-13 ENCOUNTER — Other Ambulatory Visit: Payer: Self-pay

## 2018-07-13 ENCOUNTER — Encounter (HOSPITAL_COMMUNITY)
Admission: EM | Disposition: A | Discharge status: Home / Self Care | Payer: Self-pay | Source: Home / Self Care | Attending: Emergency Medicine

## 2018-07-13 DIAGNOSIS — R079 Chest pain, unspecified: Secondary | ICD-10-CM

## 2018-07-13 DIAGNOSIS — R0989 Other specified symptoms and signs involving the circulatory and respiratory systems: Secondary | ICD-10-CM | POA: Diagnosis not present

## 2018-07-13 DIAGNOSIS — Z87891 Personal history of nicotine dependence: Secondary | ICD-10-CM | POA: Diagnosis not present

## 2018-07-13 DIAGNOSIS — J45901 Unspecified asthma with (acute) exacerbation: Secondary | ICD-10-CM

## 2018-07-13 DIAGNOSIS — I1310 Hypertensive heart and chronic kidney disease without heart failure, with stage 1 through stage 4 chronic kidney disease, or unspecified chronic kidney disease: Secondary | ICD-10-CM | POA: Diagnosis not present

## 2018-07-13 DIAGNOSIS — I441 Atrioventricular block, second degree: Secondary | ICD-10-CM

## 2018-07-13 DIAGNOSIS — K5730 Diverticulosis of large intestine without perforation or abscess without bleeding: Secondary | ICD-10-CM | POA: Diagnosis not present

## 2018-07-13 DIAGNOSIS — I25110 Atherosclerotic heart disease of native coronary artery with unstable angina pectoris: Secondary | ICD-10-CM

## 2018-07-13 DIAGNOSIS — Z955 Presence of coronary angioplasty implant and graft: Secondary | ICD-10-CM | POA: Diagnosis not present

## 2018-07-13 DIAGNOSIS — E785 Hyperlipidemia, unspecified: Secondary | ICD-10-CM | POA: Diagnosis not present

## 2018-07-13 DIAGNOSIS — I2510 Atherosclerotic heart disease of native coronary artery without angina pectoris: Secondary | ICD-10-CM

## 2018-07-13 DIAGNOSIS — N183 Chronic kidney disease, stage 3 (moderate): Secondary | ICD-10-CM | POA: Diagnosis not present

## 2018-07-13 DIAGNOSIS — J9611 Chronic respiratory failure with hypoxia: Secondary | ICD-10-CM | POA: Diagnosis not present

## 2018-07-13 HISTORY — PX: LEFT HEART CATH AND CORONARY ANGIOGRAPHY: CATH118249

## 2018-07-13 LAB — COMPREHENSIVE METABOLIC PANEL
ALT: 22 U/L (ref 0–44)
AST: 21 U/L (ref 15–41)
Albumin: 3.1 g/dL — ABNORMAL LOW (ref 3.5–5.0)
Alkaline Phosphatase: 48 U/L (ref 38–126)
Anion gap: 9 (ref 5–15)
BUN: 16 mg/dL (ref 8–23)
CO2: 29 mmol/L (ref 22–32)
Calcium: 9.5 mg/dL (ref 8.9–10.3)
Chloride: 100 mmol/L (ref 98–111)
Creatinine, Ser: 1.48 mg/dL — ABNORMAL HIGH (ref 0.61–1.24)
GFR calc Af Amer: 53 mL/min — ABNORMAL LOW (ref >60)
GFR calc non Af Amer: 46 mL/min — ABNORMAL LOW (ref >60)
GLUCOSE: 110 mg/dL — AB (ref 70–99)
Potassium: 4.2 mmol/L (ref 3.5–5.1)
SODIUM: 138 mmol/L (ref 135–145)
TOTAL PROTEIN: 6.3 g/dL — AB (ref 6.5–8.1)
Total Bilirubin: 2 mg/dL — ABNORMAL HIGH (ref 0.3–1.2)

## 2018-07-13 LAB — BASIC METABOLIC PANEL
Anion gap: 6 (ref 5–15)
BUN: 15 mg/dL (ref 8–23)
CO2: 31 mmol/L (ref 22–32)
Calcium: 9.5 mg/dL (ref 8.9–10.3)
Chloride: 103 mmol/L (ref 98–111)
Creatinine, Ser: 1.35 mg/dL — ABNORMAL HIGH (ref 0.61–1.24)
GFR calc Af Amer: 60 mL/min — ABNORMAL LOW (ref >60)
GFR calc non Af Amer: 51 mL/min — ABNORMAL LOW (ref >60)
Glucose, Bld: 101 mg/dL — ABNORMAL HIGH (ref 70–99)
Potassium: 4.2 mmol/L (ref 3.5–5.1)
SODIUM: 140 mmol/L (ref 135–145)

## 2018-07-13 LAB — CBC
HCT: 45.1 % (ref 39.0–52.0)
Hemoglobin: 14.5 g/dL (ref 13.0–17.0)
MCH: 29.6 pg (ref 26.0–34.0)
MCHC: 32.2 g/dL (ref 30.0–36.0)
MCV: 92 fL (ref 80.0–100.0)
Platelets: 183 10*3/uL (ref 150–400)
RBC: 4.9 MIL/uL (ref 4.22–5.81)
RDW: 13.6 % (ref 11.5–15.5)
WBC: 11.7 10*3/uL — ABNORMAL HIGH (ref 4.0–10.5)
nRBC: 0 % (ref 0.0–0.2)

## 2018-07-13 LAB — LIPID PANEL
Cholesterol: 140 mg/dL (ref 0–200)
HDL: 35 mg/dL — ABNORMAL LOW (ref >40)
LDL Cholesterol: 75 mg/dL (ref 0–99)
Total CHOL/HDL Ratio: 4 RATIO
Triglycerides: 152 mg/dL — ABNORMAL HIGH (ref <150)
VLDL: 30 mg/dL (ref 0–40)

## 2018-07-13 LAB — TSH: TSH: 1.915 u[IU]/mL (ref 0.350–4.500)

## 2018-07-13 LAB — TROPONIN I: Troponin I: <0.03 ng/mL (ref <0.03)

## 2018-07-13 SURGERY — LEFT HEART CATH AND CORONARY ANGIOGRAPHY
Anesthesia: LOCAL

## 2018-07-13 MED ORDER — SODIUM CHLORIDE 0.9% FLUSH
3.0000 mL | Freq: Two times a day (BID) | INTRAVENOUS | Status: DC
  Administered 2018-07-13: 3 mL via INTRAVENOUS

## 2018-07-13 MED ORDER — SODIUM CHLORIDE 0.9 % WEIGHT BASED INFUSION
1.0000 mL/kg/h | INTRAVENOUS | Status: DC
  Administered 2018-07-13 (×2): 1 mL/kg/h via INTRAVENOUS

## 2018-07-13 MED ORDER — LIDOCAINE HCL (PF) 1 % IJ SOLN
INTRAMUSCULAR | Status: DC | PRN
  Administered 2018-07-13: 10 mL

## 2018-07-13 MED ORDER — HEPARIN SODIUM (PORCINE) 1000 UNIT/ML IJ SOLN
INTRAMUSCULAR | Status: AC
  Filled 2018-07-13: qty 1

## 2018-07-13 MED ORDER — VERAPAMIL HCL 2.5 MG/ML IV SOLN
INTRA_ARTERIAL | Status: DC | PRN
  Administered 2018-07-13: 10 mL via INTRA_ARTERIAL

## 2018-07-13 MED ORDER — FENTANYL CITRATE (PF) 100 MCG/2ML IJ SOLN
INTRAMUSCULAR | Status: DC | PRN
  Administered 2018-07-13 (×2): 12.5 ug via INTRAVENOUS

## 2018-07-13 MED ORDER — SODIUM CHLORIDE 0.9% FLUSH
3.0000 mL | Freq: Two times a day (BID) | INTRAVENOUS | Status: DC
  Administered 2018-07-14: 3 mL via INTRAVENOUS

## 2018-07-13 MED ORDER — IPRATROPIUM-ALBUTEROL 0.5-2.5 (3) MG/3ML IN SOLN
3.0000 mL | Freq: Two times a day (BID) | RESPIRATORY_TRACT | Status: DC
  Administered 2018-07-13 – 2018-07-14 (×2): 3 mL via RESPIRATORY_TRACT
  Filled 2018-07-13 (×2): qty 3

## 2018-07-13 MED ORDER — MIDAZOLAM HCL 2 MG/2ML IJ SOLN
INTRAMUSCULAR | Status: AC
  Filled 2018-07-13: qty 2

## 2018-07-13 MED ORDER — IPRATROPIUM-ALBUTEROL 0.5-2.5 (3) MG/3ML IN SOLN
3.0000 mL | Freq: Four times a day (QID) | RESPIRATORY_TRACT | Status: DC
  Administered 2018-07-13: 3 mL via RESPIRATORY_TRACT
  Filled 2018-07-13: qty 3

## 2018-07-13 MED ORDER — HEPARIN SODIUM (PORCINE) 1000 UNIT/ML IJ SOLN
INTRAMUSCULAR | Status: DC | PRN
  Administered 2018-07-13: 6000 [IU] via INTRAVENOUS

## 2018-07-13 MED ORDER — SODIUM CHLORIDE 0.9 % IV SOLN
INTRAVENOUS | Status: AC
  Administered 2018-07-13: 17:00:00 via INTRAVENOUS

## 2018-07-13 MED ORDER — SODIUM CHLORIDE 0.9% FLUSH
3.0000 mL | INTRAVENOUS | Status: DC | PRN
  Administered 2018-07-14: 3 mL via INTRAVENOUS
  Filled 2018-07-13: qty 3

## 2018-07-13 MED ORDER — FENTANYL CITRATE (PF) 100 MCG/2ML IJ SOLN
INTRAMUSCULAR | Status: AC
  Filled 2018-07-13: qty 2

## 2018-07-13 MED ORDER — MIDAZOLAM HCL 2 MG/2ML IJ SOLN
INTRAMUSCULAR | Status: DC | PRN
  Administered 2018-07-13: 1 mg via INTRAVENOUS

## 2018-07-13 MED ORDER — SODIUM CHLORIDE 0.9 % IV SOLN: 250.0000 mL | INTRAVENOUS | Status: DC | PRN

## 2018-07-13 MED ORDER — IOHEXOL 350 MG/ML SOLN
INTRAVENOUS | Status: DC | PRN
  Administered 2018-07-13: 60 mL via INTRAVENOUS

## 2018-07-13 MED ORDER — VERAPAMIL HCL 2.5 MG/ML IV SOLN
INTRAVENOUS | Status: AC
  Filled 2018-07-13: qty 2

## 2018-07-13 MED ORDER — SODIUM CHLORIDE 0.9% FLUSH: 3.0000 mL | INTRAVENOUS | Status: DC | PRN

## 2018-07-13 MED ORDER — LIDOCAINE HCL (PF) 1 % IJ SOLN
INTRAMUSCULAR | Status: AC
  Filled 2018-07-13: qty 30

## 2018-07-13 MED ORDER — HEPARIN (PORCINE) IN NACL 1000-0.9 UT/500ML-% IV SOLN
INTRAVENOUS | Status: AC
  Filled 2018-07-13: qty 1000

## 2018-07-13 MED ORDER — SODIUM CHLORIDE 0.9 % WEIGHT BASED INFUSION
3.0000 mL/kg/h | INTRAVENOUS | Status: DC
  Administered 2018-07-13: 3 mL/kg/h via INTRAVENOUS

## 2018-07-13 MED ORDER — ENOXAPARIN SODIUM 40 MG/0.4ML ~~LOC~~ SOLN
40.0000 mg | SUBCUTANEOUS | Status: DC
  Administered 2018-07-14: 40 mg via SUBCUTANEOUS
  Filled 2018-07-13: qty 0.4

## 2018-07-13 MED ORDER — ASPIRIN 81 MG PO CHEW
81.0000 mg | CHEWABLE_TABLET | ORAL | Status: AC
  Administered 2018-07-13: 81 mg via ORAL
  Filled 2018-07-13: qty 1

## 2018-07-13 MED ORDER — HEPARIN (PORCINE) IN NACL 1000-0.9 UT/500ML-% IV SOLN
INTRAVENOUS | Status: DC | PRN
  Administered 2018-07-13 (×2): 500 mL

## 2018-07-13 SURGICAL SUPPLY — 10 items
CATH 5FR JL3.5 JR4 ANG PIG MP (CATHETERS) ×1 IMPLANT
DEVICE RAD COMP TR BAND LRG (VASCULAR PRODUCTS) ×1 IMPLANT
GLIDESHEATH SLEND SS 6F .021 (SHEATH) ×1 IMPLANT
GUIDEWIRE INQWIRE 1.5J.035X260 (WIRE) IMPLANT
INQWIRE 1.5J .035X260CM (WIRE) ×2
KIT HEART LEFT (KITS) ×2 IMPLANT
PACK CARDIAC CATHETERIZATION (CUSTOM PROCEDURE TRAY) ×2 IMPLANT
SYR MEDRAD MARK 7 150ML (SYRINGE) ×2 IMPLANT
TRANSDUCER W/STOPCOCK (MISCELLANEOUS) ×2 IMPLANT
TUBING CIL FLEX 10 FLL-RA (TUBING) ×2 IMPLANT

## 2018-07-13 NOTE — Progress Notes (Signed)
Rn asked pt about signing consent for possible cath in the morning. Pt states he has not talked to any MD yet and wants to wait to speak with MD in the morning

## 2018-07-13 NOTE — Consult Note (Addendum)
Cardiology Consultation:   Patient ID: [name] MRN: [id number]; DOB: Feb 02, 1946  Admit date: 07/12/2018 Date of Consult: 07/13/2018  Primary Care Provider: Maury Dus, MD Primary Cardiologist: Dr. [name] >> now to [facility] this admission (he would like to see Dr. Debara Pickett going forward) Primary Electrophysiologist:  Dr. Lovena Le (2016 consult)   Patient Profile:   [name] is a 73 y.o. male with a hx of morbid obesity, OSA, HTN, CAD who is being seen today for the evaluation of bradycardia, Mobitz One heart block at the request of Dr. Sallyanne Kuster.  History of Present Illness:   [name] was seen back in 2016 by Dr. Lovena Le (in the environment of active angina/and PCI to the RCA) for 2-1 heart block as well as possible transient third-degree heart block while he was sleeping.  Review of telemetry over the last 12 hrs reveals severe nocturnal bradycardia/ 2:1 HB. Nocturnal rates were in the 20-30 range.  Dr. Lovena Le felt that nocturnal bradycardia, possibly related to sleep apnea, and AVWB in the setting of marked first degree AV block. He has no indication for PPM. He should avoid AV nodal blocking drugs. He will someday likely need a PPM but currently there is no indication. (This 2016 consult noted that the patient in April of 2016, e was admitted to [facility] with a GI illness and was noted to have 2nd degree AVB. (note patient has 2 MRNs and the majority of his records including hospitalization and EP eval from 09/2014 is listed under [id number]). During that time, he was evaluated by EP and seen by Dr. Rayann Heman. He noted, "Mobitz I second degree AV block is likely exacerbated by GI illness but is also common in the setting of morbid obesity and sleep apnea". Since he was asymptomatic, Dr. Rayann Heman felt that no further EP w/u was indicated.He recommended avoidance of  AV nodal agents and encouraged use CPAP when asleep as well as weight loss)  May of this year admitted after a mechanical fall (no  syncope or dizziness)  The patient was admitted yesterday with c/o CP, abd pain and DOE.   He had CT chest/abd, neg for PE, no acute abd findings.  Trop were neg x2.  Last echo was in 2018, LVEF 60-65%. In the ER he was found with sats mid 90's and placed on O2, noted Mobitz one.  Admitted to medicine service, cardiology consulted with plans for [facility] today, requesting EP evaluation with suspicion this is symptomatic bradycardia presentation.  LABS K+ 4.2 BUN/Creat 15/1.35 WBC 11.7 H/H 14/45 Plts 183 Trop I: <0.03 (x2) TSH 1.915  Home meds reviewed, on amlodipine (not felt to contribute), no other potential nodal blocking agents.  The patient reports to me for about 4 months he has felt more easily winded, able to do less without having to rest, no CP until the day prior to his admission.He reports that he has been constipated for a few days, Sunday went out to eat but full quickly and unable to eat much, uncomfortable in his belly, then developed central CP.  This was constant, and associated with SOB.  He denied change at all with position or exertion.  Was "awful enough to come in on my birthday".  He is not having any symptoms currently, including his abdomen, has not yet had a BM.  The patient confirms the May fall as a trip/fall, he remembers falling/hitting the ground and certain was no LOC.  He thinks he tripped on the parking block.  He  denies weak spells, dizzy spells, and has never fainted.  He tells me that Dr. [name] has told him for 10 years his HR is slow, and in the past few years with some discussion about possible PPM, Dr. [name] has told him he did nt need one, and that when he did "I would know it"  He is intolerant of his CPAP machine, dries his nose too much, and does not use it   Past Medical History:  Diagnosis Date  . Arthritis    "left shoulder" (02/09/2015)  . Blind left eye   . CAD (coronary artery disease)   . Closed fracture of lateral portion of right tibial  plateau 11/01/2017  . Complication of anesthesia    "they give me too much anesthesia & had to put me on life support for a little bit w/gallbladder OR"  . Coronary artery disease   . Former smoker   . GERD (gastroesophageal reflux disease)   . Gout   . History of bleeding ulcers   . History of gout   . History of peptic ulcer disease   . Hyperlipidemia   . Hypertension   . Hypertensive heart disease without CHF   . Kidney stones   . Lumbar disc disease   . Myocardial infarction [facility]) 1995; 2007  . Obesity   . OSA (obstructive sleep apnea)   . Pneumonia X 2  . Prostate cancer ([place])   . Pulmonary nodule   . Second degree Mobitz I AV block     Past Surgical History:  Procedure Laterality Date  . APPENDECTOMY    . APPENDECTOMY  ~ 1985  . BACK SURGERY    . CARDIAC CATHETERIZATION N/A 02/08/2015   Procedure: Left Heart Cath and Coronary Angiography;  Surgeon: Leonie Man, MD;  Location: [place] [facility];  Service: Cardiovascular;  Laterality: N/A;  . CARDIAC CATHETERIZATION N/A 02/08/2015   Procedure: Coronary Stent Intervention;  Surgeon: Leonie Man, MD;  Location: [place] [facility];  Service: Cardiovascular;  Laterality: N/A;  . CHOLECYSTECTOMY    . CORONARY ANGIOPLASTY WITH STENT PLACEMENT  ~ 1995; 2007   "1 stent; 2 stents"  . LAPAROSCOPIC CHOLECYSTECTOMY    . LITHOTRIPSY  X 1  . LUMBAR LAMINECTOMY    . ORIF TIBIA PLATEAU Right 11/05/2017   Procedure: OPEN REDUCTION INTERNAL FIXATION (ORIF) TIBIAL PLATEAU;  Surgeon: Shona Needles, MD;  Location: [place];  Service: Orthopedics;  Laterality: Right;  . POSTERIOR LUMBAR FUSION  2003?  . PROSTATE BIOPSY  2015     Home Medications:  Prior to Admission medications   Medication Sig Start Date End Date Taking? Authorizing Provider  acetaminophen (TYLENOL) 500 MG tablet Take 500 mg by mouth every 6 (six) hours as needed for moderate pain.   Yes [provider]  allopurinol (ZYLOPRIM) 300 MG tablet Take 300 mg by  mouth Daily.  11/07/11  Yes [provider]  amLODipine (NORVASC) 10 MG tablet Take 10 mg by mouth Daily.  11/07/11  Yes [provider]  aspirin EC 81 MG EC tablet Take 1 tablet (81 mg total) by mouth daily. 02/10/15  Yes Jacolyn Reedy, MD  bisacodyl (DULCOLAX) 5 MG EC tablet Take 2 tablets (10 mg total) by mouth daily. 11/11/17 11/11/18 Yes Georgette Shell, MD  hydrochlorothiazide (HYDRODIURIL) 25 MG tablet Take 25 mg by mouth daily. 06/07/18  Yes [provider]  losartan (COZAAR) 100 MG tablet Take 100 mg by mouth daily. 06/07/18  Yes [provider]  pantoprazole (PROTONIX) 40 MG tablet Take 40 mg by mouth daily. 06/07/18  Yes [provider]  polyethylene glycol (MIRALAX / GLYCOLAX) packet Take 17 g by mouth 2 (two) times daily. 11/09/17  Yes Georgette Shell, MD    Inpatient Medications: Scheduled Meds: . allopurinol  300 mg Oral Daily  . aspirin EC  81 mg Oral Daily  . bisacodyl  10 mg Oral Daily  . enoxaparin (LOVENOX) injection  40 mg Subcutaneous Q24H  . ipratropium-albuterol  3 mL Nebulization Q6H  . morphine  2 mg Intravenous Once  . pantoprazole  40 mg Oral Daily  . polyethylene glycol  17 g Oral BID  . senna  2 tablet Oral Daily  . sodium chloride flush  3 mL Intravenous Once  . sodium chloride flush  3 mL Intravenous Q12H  . sodium chloride flush  3 mL Intravenous Q12H  . umeclidinium bromide  1 puff Inhalation Daily   Continuous Infusions: . sodium chloride    . sodium chloride    . sodium chloride 1 mL/kg/hr (07/13/18 1015)   PRN Meds: sodium chloride, sodium chloride, acetaminophen, albuterol, alum & mag hydroxide-simeth **AND** [DISCONTINUED] lidocaine, nitroGLYCERIN, ondansetron (ZOFRAN) IV, sodium chloride flush, sodium chloride flush  Allergies:    Allergies  Allergen Reactions  . Prednisone Swelling    Social History:   Social History   Socioeconomic History  . Marital status: Married    Spouse  name: Not on file  . Number of children: 2  . Years of education: Not on file  . Highest education level: Not on file  Occupational History  . Occupation: retired  Scientific laboratory technician  . Financial resource strain: Not on file  . Food insecurity:    Worry: Not on file    Inability: Not on file  . Transportation needs:    Medical: Not on file    Non-medical: Not on file  Tobacco Use  . Smoking status: Former Smoker    Packs/day: 3.00    Years: 35.00    Pack years: 105.00    Types: Cigarettes    Last attempt to quit: 06/17/1983    Years since quitting: 35.0  . Smokeless tobacco: Never Used  . Tobacco comment: "quit smoking cigarettes in 1985  Substance and Sexual Activity  . Alcohol use: Yes    Alcohol/week: 0.0 standard drinks    Comment: "I drank a whole lot when I was younger; nothing since 1990"  . Drug use: No  . Sexual activity: Never  Lifestyle  . Physical activity:    Days per week: Not on file    Minutes per session: Not on file  . Stress: Not on file  Relationships  . Social connections:    Talks on phone: Not on file    Gets together: Not on file    Attends religious service: Not on file    Active member of club or organization: Not on file    Attends meetings of clubs or organizations: Not on file    Relationship status: Not on file  . Intimate partner violence:    Fear of current or ex partner: Not on file    Emotionally abused: Not on file    Physically abused: Not on file    Forced sexual activity: Not on file  Other Topics Concern  . Not on file  Social History Narrative   ** Merged History Encounter **        Family History:  Family History  Problem Relation Age of Onset  . Hypertension Mother   . Coronary artery disease Mother   . Aneurysm Father   . Hypertension Father   . COPD Sister   . Sleep apnea Unknown        3 siblings     ROS:  Please see the history of present illness.  All other ROS reviewed and negative.     Physical Exam/Data:     Vitals:   07/12/18 2330 07/13/18 0125 07/13/18 0127 07/13/18 0810  BP: 132/63 137/85  (!) 100/42  Pulse: 64 65  66  Resp: 13 20  20   Temp:  99.3 F (37.4 C)    TempSrc:  Oral    SpO2: 93% 94%  91%  Weight:   (!) 146.3 kg   Height:   6' (1.829 m)     Intake/Output Summary (Last 24 hours) at 07/13/2018 1022 Last data filed at 07/13/2018 0800 Gross per 24 hour  Intake 320 ml  Output 475 ml  Net -155 ml   Last 3 Weights 07/13/2018 04/19/2018 11/01/2017  Weight (lbs) 322 lb 9.6 oz 318 lb 312 lb  Weight (kg) 146.33 kg 144.244 kg 141.522 kg     Body mass index is 43.75 kg/m.  General:  Well nourished, well developed, in no acute distress HEENT: normal Lymph: no adenopathy Neck: no JVD Endocrine:  No thryomegaly Vascular: No carotid bruits Cardiac:  RRR, bradycardic; no murmurs, gallops or rubs Lungs:  CTA b/l, no wheezing, rhonchi or rales  Abd: soft, nontender, obese  Ext: no edema Musculoskeletal:  No deformities Skin: warm and dry  Neuro:  No gross focal abnormalities noted Psych:  Normal affect   EKG:  The EKG was personally reviewed and demonstrates:    #1 SB, 47bpm, 1st degree AVblock, Mobitz I, PVC, QRS 60ms #2 SR 62, 1st degree AVBlock, Mobitz I #3 SB 45, 1st degree AVblock, Mobitz I Telemetry:  Telemetry was personally reviewed and demonstrates:   SR, Mobitz I AVBlock, brief periods of 2:1 conduction, Vrates 40's-50's (transiently 30's)  Relevant CV Studies:  04/15/17: TTE Study Conclusions - Left ventricle: The cavity size was normal. There was moderate   concentric hypertrophy. Systolic function was normal. The   estimated ejection fraction was in the range of 60% to 65%. Wall   motion was normal; there were no regional wall motion   abnormalities. - Aortic valve: Transvalvular velocity was within the normal range.   There was no stenosis. There was no regurgitation. - Mitral valve: Transvalvular velocity was within the normal range.   There was no  evidence for stenosis. There was trivial   regurgitation. - Left atrium: The atrium was moderately dilated. - Right ventricle: The cavity size was normal. Wall thickness was   normal. Systolic function was normal. - Tricuspid valve: There was mild regurgitation. - Pulmonary arteries: Systolic pressure was within the normal   range. PA peak pressure: 36 mm Hg (S). - Pericardium, extracardiac: A trivial pericardial effusion was   identified.  02/08/15: LHC/PCI 1. Prox RCA to Mid RCA lesion, 95% stenosed. A REBEL 4.0 mm x 20 mm bare metal stent was placed. There is a 0% residual stenosis post intervention. 2. Ost LAD to Mid LAD lesion, 30% stenosed. The lesion was previously treated with a bare metal stent greater than two years ago. Mid LAD lesion, 30% stenosed. beyond the stent 3. 2nd Mrg-2 lesion, 10% stenosed. The lesion was previously treated with a bare metal stent  one to two years ago. 4. The left ventricular systolic function is normal. 5. Moderately elevated LVEDP with systemic HTN    Successful PCI of mid RCA with bare metal stent with excellent result  Laboratory Data:  Chemistry Recent Labs  Lab 07/12/18 1153 07/13/18 0216  NA 137 138  K 3.8 4.2  CL 98 100  CO2 30 29  GLUCOSE 97 110*  BUN 14 16  CREATININE 1.16 1.48*  CALCIUM 10.3 9.5  GFRNONAA >60 46*  GFRAA >60 53*  ANIONGAP 9 9    Recent Labs  Lab 07/12/18 1634 07/13/18 0216  PROT 7.8 6.3*  ALBUMIN 3.7 3.1*  AST 21 21  ALT 20 22  ALKPHOS 53 48  BILITOT 2.2* 2.0*   Hematology Recent Labs  Lab 07/12/18 1153 07/13/18 0216  WBC 12.6* 11.7*  RBC 5.39 4.90  HGB 15.7 14.5  HCT 49.1 45.1  MCV 91.1 92.0  MCH 29.1 29.6  MCHC 32.0 32.2  RDW 13.4 13.6  PLT 214 183   Cardiac Enzymes Recent Labs  Lab 07/12/18 2023 07/13/18 0216  TROPONINI <0.03 <0.03    Recent Labs  Lab 07/12/18 1202 07/12/18 1643  TROPIPOC 0.01 0.01    BNPNo results for input(s): BNP, PROBNP in the last 168 hours.  DDimer  No results for input(s): DDIMER in the last 168 hours.  Radiology/Studies:   Dg Chest 2 View Result Date: 07/12/2018 CLINICAL DATA:  Chest pain. Cough and shortness of breath for the last 2 days. EXAM: CHEST - 2 VIEW COMPARISON:  One-view chest x-ray 11/01/2017 FINDINGS: The heart is enlarged. Mild pulmonary vascular congestion is present. There are no effusions. No focal airspace disease is present. Visualized soft tissues and bony thorax are unremarkable. IMPRESSION: 1. Cardiomegaly and mild pulmonary vascular congestion without frank edema. 2. No focal airspace disease. Electronically Signed   By: San Morelle M.D.   On: 07/12/2018 12:33    Ct Angio Chest Pe W/cm &/or Wo Cm Result Date: 07/12/2018 CLINICAL DATA:  Chest and abdominal pain with shortness of breath and dizziness. EXAM: CT ANGIOGRAPHY CHEST CT ABDOMEN AND PELVIS WITH CONTRAST TECHNIQUE: Multidetector CT imaging of the chest was performed using the standard protocol during bolus administration of intravenous contrast. Multiplanar CT image reconstructions and MIPs were obtained to evaluate the vascular anatomy. Multidetector CT imaging of the abdomen and pelvis was performed using the standard protocol during bolus administration of intravenous contrast. CONTRAST:  179mL ISOVUE-370 IOPAMIDOL (ISOVUE-370) INJECTION 76% COMPARISON:  Chest CT 04/15/2017 FINDINGS: CTA CHEST FINDINGS Cardiovascular: The heart is upper limits of normal in size and stable. Stable mild tortuosity, ectasia and calcification of the thoracic aorta. The branch vessels are patent. Stable coronary artery calcifications and coronary artery stents. The pulmonary arterial tree is fairly well opacified. No filling defects to suggest pulmonary embolism. Mediastinum/Nodes: No mediastinal or hilar mass or adenopathy. The esophagus is unremarkable. Lungs/Pleura: Limited by breathing motion artifact but no obvious infiltrates or pulmonary lesions. Patchy basilar subsegmental  atelectasis and some scarring changes. No pleural effusion. Musculoskeletal: No significant bony findings. Review of the MIP images confirms the above findings. CT ABDOMEN and PELVIS FINDINGS Hepatobiliary: No focal hepatic lesions or intrahepatic biliary dilatation. The gallbladder is surgically absent. No common bile duct dilatation. Pancreas: No mass, inflammation or ductal dilatation. Spleen: Normal size.  No focal lesions. Adrenals/Urinary Tract: The adrenal glands and kidneys are unremarkable and stable. No worrisome renal lesions or hydronephrosis. The bladder is unremarkable. Stomach/Bowel: The stomach, duodenum, small bowel  and colon are unremarkable. No acute inflammatory changes, mass lesions or obstructive findings. Descending and sigmoid colon diverticulosis but no findings for acute diverticulitis. The terminal ileum is normal. The appendix is surgically absent. Vascular/Lymphatic: Advanced atherosclerotic calcifications involving the aorta and iliac arteries. No aneurysm or dissection. The branch vessels are patent. The major venous structures are patent. Small scattered mesenteric and retroperitoneal lymph nodes but no mass or overt adenopathy. Reproductive: The prostate gland and seminal vesicles are unremarkable. Other: Borderline right-sided pelvic lymph nodes are noted. 12.5 mm lateral external iliac lymph node on image number 73. 11 mm obturator region lymph node on image number 78. 10 mm external iliac lymph node on image number 77. No inguinal lymphadenopathy. Musculoskeletal: No significant bony findings. Lumbar fusion hardware noted at L4-5. There is advanced degenerate disc disease at L5-S1. Moderate bilateral hip joint degenerative changes. Review of the MIP images confirms the above findings. IMPRESSION: 1. No CT findings for acute pulmonary embolism. 2. Reflux of contrast material down the IVC may be due to tricuspid regurgitation. 3. No thoracic aortic aneurysm or dissection. Moderate  atherosclerotic calcifications. 4. No acute pulmonary findings. Streaky bibasilar atelectasis and scarring. 5. No acute abdominal/pelvic findings, mass lesions or adenopathy. 6. Borderline right-sided external iliac lymph nodes. A repeat pelvic CT scan in 4 months may be helpful to re-evaluate. 7. Colonic diverticulosis without findings for acute diverticulitis. Electronically Signed   By: Marijo Sanes M.D.   On: 07/12/2018 17:26     Assessment and Plan:   1. CP, abdominal pain (neither active), progressive DOE (4 months) 2. CAD     Pending cath     Continue per primary cardiology team 3. Constipation     As per IM  4. Bradycardia, known 1st degree AVblock, Mobitz I block     Historically with GI illness, and RCA CAD has had EP evaluations for variable Heart block, worse at night go back to 2016 (his oldest EKGs in the system)      His bradycardia Mobitz 1 goes back years, he has transient episodes of 2:1 conduction (uncertain if he was sleeping with all of these (some noted this AM would assume was awake) he has not had symptoms here.  This goes back years with mention of CHB historically as well (with GI illness)  Agree with cath   He is non complaint with his CPAP No  Hx of near syncope or syncope Not convinced of symptomatic bradycardia  Await cath findings Dr. Rayann Heman will see later today    For questions or updates, please contact [facility] Please consult [email or web address] for contact info under     Signed, Baldwin Jamaica, PA-C  07/13/2018 10:22 AM;t   I have seen, examined the patient, and reviewed the above assessment and plan.  Changes to above are made where necessary.  On exam, regularly irregular rhythm.  Dissheveled, + edema.  Pt with chronic mobitz I second degree AV block. I do not feel that this is the cause of his symptoms.  No indication for pacing. Cath today. Lifestyle modification including weight loss, exercise, compliance with CPAP, and sodium  restriction advised.  Electrophysiology team to see as needed while here. Please call with questions.   Co Sign: Thompson Grayer, MD 07/13/2018 3:24 PM

## 2018-07-13 NOTE — Plan of Care (Signed)
  Problem: Activity: Goal: Capacity to carry out activities will improve Outcome: Progressing   Problem: Education: Goal: Knowledge of General Education information will improve Description Including pain rating scale, medication(s)/side effects and non-pharmacologic comfort measures Outcome: Progressing   Problem: Activity: Goal: Risk for activity intolerance will decrease Outcome: Progressing   Problem: Elimination: Goal: Will not experience complications related to bowel motility Outcome: Progressing   Problem: Safety: Goal: Ability to remain free from injury will improve Outcome: Progressing   

## 2018-07-13 NOTE — H&P (View-Only) (Signed)
DAILY PROGRESS NOTE   Patient Name: [name] Date of Encounter: 07/13/2018 Cardiologist: No primary care provider on file.  Chief Complaint   No chest pain today  Patient Profile   [name] is a 73 y.o. male with a hx of coronary artery disease w/ prior RCA stenting, prior Mobitz 1 second-degree AV block, hypertension, obstructive sleep apnea, obesity and asthma who is being seen today for the evaluation of chest pain and bradycardia at the request of Dr. Myna Hidalgo, Internal Medicine  Subjective   No pain today - creatinine increased overnight, but he has CKD with baseline creatinine between 1.2-1.5. Hydrated overnight - plans for possible cath today, pending repeat BMET. Will likely need EP evaluation - he had second degree Type 1 AVB-  Rates in the 30's/40's.   Objective   Vitals:   07/12/18 2330 07/13/18 0125 07/13/18 0127 07/13/18 0810  BP: 132/63 137/85  (!) 100/42  Pulse: 64 65  66  Resp: 13 20  20   Temp:  99.3 F (37.4 C)    TempSrc:  Oral    SpO2: 93% 94%  91%  Weight:   (!) 146.3 kg   Height:   6' (1.829 m)     Intake/Output Summary (Last 24 hours) at 07/13/2018 1012 Last data filed at 07/13/2018 0800 Gross per 24 hour  Intake 320 ml  Output 475 ml  Net -155 ml   Filed Weights   07/13/18 0127  Weight: (!) 146.3 kg    Physical Exam   General appearance: alert and morbidly obese Neck: JVD - 3 cm above sternal notch, no carotid bruit and thyroid not enlarged, symmetric, no tenderness/mass/nodules Lungs: diminished breath sounds bilaterally Heart: regular bradycardia Abdomen: soft, non-tender; bowel sounds normal; no masses,  no organomegaly Extremities: edema 2+ edema Pulses: 2+ and symmetric Skin: Skin color, texture, turgor normal. No rashes or lesions Neurologic: Grossly normal Psych: Pleasant  Inpatient Medications    Scheduled Meds: . allopurinol  300 mg Oral Daily  . aspirin EC  81 mg Oral Daily  . bisacodyl  10 mg Oral Daily  .  enoxaparin (LOVENOX) injection  40 mg Subcutaneous Q24H  . morphine  2 mg Intravenous Once  . pantoprazole  40 mg Oral Daily  . polyethylene glycol  17 g Oral BID  . senna  2 tablet Oral Daily  . sodium chloride flush  3 mL Intravenous Once  . sodium chloride flush  3 mL Intravenous Q12H  . sodium chloride flush  3 mL Intravenous Q12H  . umeclidinium bromide  1 puff Inhalation Daily    Continuous Infusions: . sodium chloride    . sodium chloride    . sodium chloride 1 mL/kg/hr (07/13/18 0605)    PRN Meds: sodium chloride, sodium chloride, acetaminophen, albuterol, alum & mag hydroxide-simeth **AND** [DISCONTINUED] lidocaine, nitroGLYCERIN, ondansetron (ZOFRAN) IV, sodium chloride flush, sodium chloride flush   Labs   Results for orders placed or performed during the hospital encounter of 07/12/18 (from the past 48 hour(s))  Basic metabolic panel     Status: None   Collection Time: 07/12/18 11:53 AM  Result Value Ref Range   Sodium 137 135 - 145 mmol/L   Potassium 3.8 3.5 - 5.1 mmol/L   Chloride 98 98 - 111 mmol/L   CO2 30 22 - 32 mmol/L   Glucose, Bld 97 70 - 99 mg/dL   BUN 14 8 - 23 mg/dL   Creatinine, Ser 1.16 0.61 - 1.24 mg/dL  Calcium 10.3 8.9 - 10.3 mg/dL   GFR calc non Af Amer >60 >60 mL/min   GFR calc Af Amer >60 >60 mL/min   Anion gap 9 5 - 15    Comment: Performed at [facility] [address], [place], [place] [address]  CBC     Status: Abnormal   Collection Time: 07/12/18 11:53 AM  Result Value Ref Range   WBC 12.6 (H) 4.0 - 10.5 K/uL   RBC 5.39 4.22 - 5.81 MIL/uL   Hemoglobin 15.7 13.0 - 17.0 g/dL   HCT 49.1 39.0 - 52.0 %   MCV 91.1 80.0 - 100.0 fL   MCH 29.1 26.0 - 34.0 pg   MCHC 32.0 30.0 - 36.0 g/dL   RDW 13.4 11.5 - 15.5 %   Platelets 214 150 - 400 K/uL   nRBC 0.0 0.0 - 0.2 %    Comment: Performed at [facility], [place] [address], [place], [place] [address]  I-stat troponin, ED     Status: None   Collection Time: 07/12/18 12:02 PM    Result Value Ref Range   Troponin i, poc 0.01 0.00 - 0.08 ng/mL   Comment 3            Comment: Due to the release kinetics of cTnI, a negative result within the first hours of the onset of symptoms does not rule out myocardial infarction with certainty. If myocardial infarction is still suspected, repeat the test at appropriate intervals.   Hepatic function panel     Status: Abnormal   Collection Time: 07/12/18  4:34 PM  Result Value Ref Range   Total Protein 7.8 6.5 - 8.1 g/dL   Albumin 3.7 3.5 - 5.0 g/dL   AST 21 15 - 41 U/L   ALT 20 0 - 44 U/L   Alkaline Phosphatase 53 38 - 126 U/L   Total Bilirubin 2.2 (H) 0.3 - 1.2 mg/dL   Bilirubin, Direct 0.4 (H) 0.0 - 0.2 mg/dL   Indirect Bilirubin 1.8 (H) 0.3 - 0.9 mg/dL    Comment: Performed at [facility] [address] [place], [place] [address]  Lipase, blood     Status: Abnormal   Collection Time: 07/12/18  4:34 PM  Result Value Ref Range   Lipase 58 (H) 11 - 51 U/L    Comment: Performed at [facility] [address] [place], [place] [address]  I-stat troponin, ED     Status: None   Collection Time: 07/12/18  4:43 PM  Result Value Ref Range   Troponin i, poc 0.01 0.00 - 0.08 ng/mL   Comment 3            Comment: Due to the release kinetics of cTnI, a negative result within the first hours of the onset of symptoms does not rule out myocardial infarction with certainty. If myocardial infarction is still suspected, repeat the test at appropriate intervals.   Troponin I - Now Then Q6H     Status: None   Collection Time: 07/12/18  8:23 PM  Result Value Ref Range   Troponin I <0.03 <0.03 ng/mL    Comment: Performed at [facility] [address], [place], [place] [address]  Urinalysis, Routine w reflex microscopic     Status: Abnormal   Collection Time: 07/12/18  8:24 PM  Result Value Ref Range   Color, Urine YELLOW YELLOW   APPearance CLEAR CLEAR   Specific Gravity, Urine >1.046 (H)  1.005 - 1.030    pH 5.0 5.0 - 8.0   Glucose, UA NEGATIVE NEGATIVE mg/dL   Hgb urine dipstick NEGATIVE NEGATIVE   Bilirubin Urine NEGATIVE NEGATIVE   Ketones, ur NEGATIVE NEGATIVE mg/dL   Protein, ur 100 (A) NEGATIVE mg/dL   Nitrite NEGATIVE NEGATIVE   Leukocytes, UA NEGATIVE NEGATIVE   RBC / HPF 0-5 0 - 5 RBC/hpf   WBC, UA 0-5 0 - 5 WBC/hpf   Bacteria, UA NONE SEEN NONE SEEN    Comment: Performed at [facility] [address] [place], [place] [address]  Comprehensive metabolic panel     Status: Abnormal   Collection Time: 07/13/18  2:16 AM  Result Value Ref Range   Sodium 138 135 - 145 mmol/L   Potassium 4.2 3.5 - 5.1 mmol/L   Chloride 100 98 - 111 mmol/L   CO2 29 22 - 32 mmol/L   Glucose, Bld 110 (H) 70 - 99 mg/dL   BUN 16 8 - 23 mg/dL   Creatinine, Ser 1.48 (H) 0.61 - 1.24 mg/dL   Calcium 9.5 8.9 - 10.3 mg/dL   Total Protein 6.3 (L) 6.5 - 8.1 g/dL   Albumin 3.1 (L) 3.5 - 5.0 g/dL   AST 21 15 - 41 U/L   ALT 22 0 - 44 U/L   Alkaline Phosphatase 48 38 - 126 U/L   Total Bilirubin 2.0 (H) 0.3 - 1.2 mg/dL   GFR calc non Af Amer 46 (L) >60 mL/min   GFR calc Af Amer 53 (L) >60 mL/min   Anion gap 9 5 - 15    Comment: Performed at [facility] [address] [place], [place] [address]  CBC     Status: Abnormal   Collection Time: 07/13/18  2:16 AM  Result Value Ref Range   WBC 11.7 (H) 4.0 - 10.5 K/uL   RBC 4.90 4.22 - 5.81 MIL/uL   Hemoglobin 14.5 13.0 - 17.0 g/dL   HCT 45.1 39.0 - 52.0 %   MCV 92.0 80.0 - 100.0 fL   MCH 29.6 26.0 - 34.0 pg   MCHC 32.2 30.0 - 36.0 g/dL   RDW 13.6 11.5 - 15.5 %   Platelets 183 150 - 400 K/uL   nRBC 0.0 0.0 - 0.2 %    Comment: Performed at [facility], [place] [address] [place], [place] [address]  Troponin I - Now Then Q6H     Status: None   Collection Time: 07/13/18  2:16 AM  Result Value Ref Range   Troponin I <0.03 <0.03 ng/mL    Comment: Performed at [facility] [address] [place], [place] [address]  Lipid panel      Status: Abnormal   Collection Time: 07/13/18  2:16 AM  Result Value Ref Range   Cholesterol 140 0 - 200 mg/dL   Triglycerides 152 (H) <150 mg/dL   HDL 35 (L) >40 mg/dL   Total CHOL/HDL Ratio 4.0 RATIO   VLDL 30 0 - 40 mg/dL   LDL Cholesterol 75 0 - 99 mg/dL    Comment:        Total Cholesterol/HDL:CHD Risk Coronary Heart Disease Risk Table                     Men   Women  1/2 Average Risk   3.4   3.3  Average Risk       5.0   4.4  2 X Average  Risk   9.6   7.1  3 X Average Risk  23.4   11.0        Use the calculated Patient Ratio above and the CHD Risk Table to determine the patient's CHD Risk.        ATP III CLASSIFICATION (LDL):  <100     mg/dL   Optimal  100-129  mg/dL   Near or Above                    Optimal  130-159  mg/dL   Borderline  160-189  mg/dL   High  >190     mg/dL   Very High Performed at [facility] [address] [place], [place] [address]   TSH     Status: None   Collection Time: 07/13/18  2:16 AM  Result Value Ref Range   TSH 1.915 0.350 - 4.500 uIU/mL    Comment: Performed by a 3rd Generation assay with a functional sensitivity of <=0.01 uIU/mL. Performed at [facility], [place] [address] [place],  [address]     ECG   Sinus bradycardia with Mobitz 2, type I AVB - Personally Reviewed  Telemetry   Mobitz 2, type 1 AVB - Personally Reviewed  Radiology    Dg Chest 2 View  Result Date: 07/12/2018 CLINICAL DATA:  Chest pain. Cough and shortness of breath for the last 2 days. EXAM: CHEST - 2 VIEW COMPARISON:  One-view chest x-ray 11/01/2017 FINDINGS: The heart is enlarged. Mild pulmonary vascular congestion is present. There are no effusions. No focal airspace disease is present. Visualized soft tissues and bony thorax are unremarkable. IMPRESSION: 1. Cardiomegaly and mild pulmonary vascular congestion without frank edema. 2. No focal airspace disease. Electronically Signed   By: San Morelle M.D.   On: 07/12/2018 12:33    Ct Angio Chest Pe W/cm &/or Wo Cm  Result Date: 07/12/2018 CLINICAL DATA:  Chest and abdominal pain with shortness of breath and dizziness. EXAM: CT ANGIOGRAPHY CHEST CT ABDOMEN AND PELVIS WITH CONTRAST TECHNIQUE: Multidetector CT imaging of the chest was performed using the standard protocol during bolus administration of intravenous contrast. Multiplanar CT image reconstructions and MIPs were obtained to evaluate the vascular anatomy. Multidetector CT imaging of the abdomen and pelvis was performed using the standard protocol during bolus administration of intravenous contrast. CONTRAST:  162mL ISOVUE-370 IOPAMIDOL (ISOVUE-370) INJECTION 76% COMPARISON:  Chest CT 04/15/2017 FINDINGS: CTA CHEST FINDINGS Cardiovascular: The heart is upper limits of normal in size and stable. Stable mild tortuosity, ectasia and calcification of the thoracic aorta. The branch vessels are patent. Stable coronary artery calcifications and coronary artery stents. The pulmonary arterial tree is fairly well opacified. No filling defects to suggest pulmonary embolism. Mediastinum/Nodes: No mediastinal or hilar mass or adenopathy. The esophagus is unremarkable. Lungs/Pleura: Limited by breathing motion artifact but no obvious infiltrates or pulmonary lesions. Patchy basilar subsegmental atelectasis and some scarring changes. No pleural effusion. Musculoskeletal: No significant bony findings. Review of the MIP images confirms the above findings. CT ABDOMEN and PELVIS FINDINGS Hepatobiliary: No focal hepatic lesions or intrahepatic biliary dilatation. The gallbladder is surgically absent. No common bile duct dilatation. Pancreas: No mass, inflammation or ductal dilatation. Spleen: Normal size.  No focal lesions. Adrenals/Urinary Tract: The adrenal glands and kidneys are unremarkable and stable. No worrisome renal lesions or hydronephrosis. The bladder is unremarkable. Stomach/Bowel: The stomach, duodenum, small bowel and colon are  unremarkable. No acute inflammatory changes, mass lesions  or obstructive findings. Descending and sigmoid colon diverticulosis but no findings for acute diverticulitis. The terminal ileum is normal. The appendix is surgically absent. Vascular/Lymphatic: Advanced atherosclerotic calcifications involving the aorta and iliac arteries. No aneurysm or dissection. The branch vessels are patent. The major venous structures are patent. Small scattered mesenteric and retroperitoneal lymph nodes but no mass or overt adenopathy. Reproductive: The prostate gland and seminal vesicles are unremarkable. Other: Borderline right-sided pelvic lymph nodes are noted. 12.5 mm lateral external iliac lymph node on image number 73. 11 mm obturator region lymph node on image number 78. 10 mm external iliac lymph node on image number 77. No inguinal lymphadenopathy. Musculoskeletal: No significant bony findings. Lumbar fusion hardware noted at L4-5. There is advanced degenerate disc disease at L5-S1. Moderate bilateral hip joint degenerative changes. Review of the MIP images confirms the above findings. IMPRESSION: 1. No CT findings for acute pulmonary embolism. 2. Reflux of contrast material down the IVC may be due to tricuspid regurgitation. 3. No thoracic aortic aneurysm or dissection. Moderate atherosclerotic calcifications. 4. No acute pulmonary findings. Streaky bibasilar atelectasis and scarring. 5. No acute abdominal/pelvic findings, mass lesions or adenopathy. 6. Borderline right-sided external iliac lymph nodes. A repeat pelvic CT scan in 4 months may be helpful to re-evaluate. 7. Colonic diverticulosis without findings for acute diverticulitis. Electronically Signed   By: Marijo Sanes M.D.   On: 07/12/2018 17:26   Ct Abdomen Pelvis W Contrast  Result Date: 07/12/2018 CLINICAL DATA:  Chest and abdominal pain with shortness of breath and dizziness. EXAM: CT ANGIOGRAPHY CHEST CT ABDOMEN AND PELVIS WITH CONTRAST TECHNIQUE:  Multidetector CT imaging of the chest was performed using the standard protocol during bolus administration of intravenous contrast. Multiplanar CT image reconstructions and MIPs were obtained to evaluate the vascular anatomy. Multidetector CT imaging of the abdomen and pelvis was performed using the standard protocol during bolus administration of intravenous contrast. CONTRAST:  136mL ISOVUE-370 IOPAMIDOL (ISOVUE-370) INJECTION 76% COMPARISON:  Chest CT 04/15/2017 FINDINGS: CTA CHEST FINDINGS Cardiovascular: The heart is upper limits of normal in size and stable. Stable mild tortuosity, ectasia and calcification of the thoracic aorta. The branch vessels are patent. Stable coronary artery calcifications and coronary artery stents. The pulmonary arterial tree is fairly well opacified. No filling defects to suggest pulmonary embolism. Mediastinum/Nodes: No mediastinal or hilar mass or adenopathy. The esophagus is unremarkable. Lungs/Pleura: Limited by breathing motion artifact but no obvious infiltrates or pulmonary lesions. Patchy basilar subsegmental atelectasis and some scarring changes. No pleural effusion. Musculoskeletal: No significant bony findings. Review of the MIP images confirms the above findings. CT ABDOMEN and PELVIS FINDINGS Hepatobiliary: No focal hepatic lesions or intrahepatic biliary dilatation. The gallbladder is surgically absent. No common bile duct dilatation. Pancreas: No mass, inflammation or ductal dilatation. Spleen: Normal size.  No focal lesions. Adrenals/Urinary Tract: The adrenal glands and kidneys are unremarkable and stable. No worrisome renal lesions or hydronephrosis. The bladder is unremarkable. Stomach/Bowel: The stomach, duodenum, small bowel and colon are unremarkable. No acute inflammatory changes, mass lesions or obstructive findings. Descending and sigmoid colon diverticulosis but no findings for acute diverticulitis. The terminal ileum is normal. The appendix is surgically  absent. Vascular/Lymphatic: Advanced atherosclerotic calcifications involving the aorta and iliac arteries. No aneurysm or dissection. The branch vessels are patent. The major venous structures are patent. Small scattered mesenteric and retroperitoneal lymph nodes but no mass or overt adenopathy. Reproductive: The prostate gland and seminal vesicles are unremarkable. Other: Borderline right-sided pelvic lymph nodes are noted.  12.5 mm lateral external iliac lymph node on image number 73. 11 mm obturator region lymph node on image number 78. 10 mm external iliac lymph node on image number 77. No inguinal lymphadenopathy. Musculoskeletal: No significant bony findings. Lumbar fusion hardware noted at L4-5. There is advanced degenerate disc disease at L5-S1. Moderate bilateral hip joint degenerative changes. Review of the MIP images confirms the above findings. IMPRESSION: 1. No CT findings for acute pulmonary embolism. 2. Reflux of contrast material down the IVC may be due to tricuspid regurgitation. 3. No thoracic aortic aneurysm or dissection. Moderate atherosclerotic calcifications. 4. No acute pulmonary findings. Streaky bibasilar atelectasis and scarring. 5. No acute abdominal/pelvic findings, mass lesions or adenopathy. 6. Borderline right-sided external iliac lymph nodes. A repeat pelvic CT scan in 4 months may be helpful to re-evaluate. 7. Colonic diverticulosis without findings for acute diverticulitis. Electronically Signed   By: Marijo Sanes M.D.   On: 07/12/2018 17:26    Cardiac Studies   Pending  Assessment   1. Principal Problem: 2.   Chest pain 3. Active Problems: 4.   CAD (coronary artery disease) 5.   OSA (obstructive sleep apnea) 6.   Heart block AV second degree 7.   History of peptic ulcer disease 8.   Asthma, chronic, unspecified asthma severity, with acute exacerbation 9.   Chronic respiratory failure with hypoxia (HCC) 10.   Abnormal EKG 11.   Hypertension 12.   Plan    1. Chest pain better today - HR improved, but more AV block overnight. Plans for cath today - he has CKD, so creatinine is probably close to baseline. Will reach out to EP for recommendation as to whether a pacemaker may be necessary as per Dr. Victorino December recommendations.  Time Spent Directly with Patient:  I have spent a total of 25 minutes with the patient reviewing hospital notes, telemetry, EKGs, labs and examining the patient as well as establishing an assessment and plan that was discussed personally with the patient.  > 50% of time was spent in direct patient care.  Length of Stay:  LOS: 0 days   Pixie Casino, MD, [facility], [place] Director of the Advanced Lipid Disorders &  Cardiovascular Risk Reduction Clinic Diplomate of the American Board of Clinical Lipidology Attending Cardiologist  Direct Dial: [phone]  Fax: [phone]  Website:  [email or web address]  Nadean Corwin Tamaka Sawin 07/13/2018, 10:12 AM

## 2018-07-13 NOTE — Progress Notes (Signed)
PROGRESS NOTE  [name] JKK:[id number] DOB: December 15, 1945 DOA: 07/12/2018 PCP: Maury Dus, MD   LOS: 0 days   Brief Narrative / Interim history: [name] is a 73 y.o. male with medical history significant for severe OSA, chronic dyspnea with possible asthma and OHS followed by pulmonology, coronary artery disease, hypertension, and second-degree Mobitz 1 block, now presenting to the emergency department for evaluation of shortness of breath, chest pain, and abdominal pain.  Patient reports insidiously worsening exertional dyspnea with mild nonproductive cough and no fevers or chills.  He also reports pain in the abdomen and chest that started the day prior to admission, has been waxing and waning, without alleviating or exacerbating factors identified, and unlike his prior experience with angina.  Patient acknowledges nonadherence with his home oxygen and nocturnal BiPAP.  Subjective: Complains of significant constipation this morning, as well as lower abdominal cramping.  Has been to the bathroom in the last 4 to 5 days.  No chest pain, no significant shortness of breath.  Assessment & Plan: Principal Problem:   Chest pain Active Problems:   CAD (coronary artery disease)   OSA (obstructive sleep apnea)   Heart block AV second degree   History of peptic ulcer disease   Asthma, chronic, unspecified asthma severity, with acute exacerbation   Chronic respiratory failure with hypoxia (HCC)   Abnormal EKG   Hypertension   Principal Problem Chest pain / CAD   -He presents with 1 day of chest pain, little bit different from his prior cardiac angina -EKG similar to priors, CT angiogram negative for PE, troponin has remained negative. -Cardiology following -Last cardiac cath in 2016 had a PCI to the mid RCA, he also had nonobstructive disease in the mid LAD and second marginal, treated medically.  Last echo showed normal EF in 2018. -It appears that plans are in place for cardiac  catheterization today  Additional problems Chronic kidney disease stage III -Patient's baseline creatinine around 1.2-1.5, yesterday was lower than baseline at 1.6, this morning 1.48 which is more consistent with his baseline.  Of note, he did receive contrast with a CT angiogram on admission on 1/27.  Asthma / OSA  -Patient follows with pulmonology for chronic dyspnea with severe OSA, possible asthma, likely OHS  -He is non-compliant with nocturnal BiPAP, supplemental O2, and inhalers  -He is dyspneic on admission with no acute pulmonary disease on CTA, no fever or cough  -He did have URI type symptoms with runny nose over the last week but no fevers or flulike symptoms  Hyperbilirubinemia  -Total bilirubin is 2.2 in ED, mainly unconjugated, previously normal  -He presented with abd pain that resolved with IV PPI and analgesia in ED, abd exam benign  -On CT, there is no focal liver lesion, no CBD or intrahepatic ductal dilatation, and gallbladder is surgically absent   -LFTs are normal this morning and bilirubin is improving.  AV block  -EP consult pending  Constipation -Aggressive bowel regimen post-cath   Scheduled Meds: . allopurinol  300 mg Oral Daily  . aspirin EC  81 mg Oral Daily  . bisacodyl  10 mg Oral Daily  . enoxaparin (LOVENOX) injection  40 mg Subcutaneous Q24H  . morphine  2 mg Intravenous Once  . pantoprazole  40 mg Oral Daily  . polyethylene glycol  17 g Oral BID  . senna  2 tablet Oral Daily  . sodium chloride flush  3 mL Intravenous Once  . sodium chloride flush  3 mL Intravenous Q12H  . sodium chloride flush  3 mL Intravenous Q12H  . umeclidinium bromide  1 puff Inhalation Daily   Continuous Infusions: . sodium chloride    . sodium chloride    . sodium chloride 1 mL/kg/hr (07/13/18 0605)   PRN Meds:.sodium chloride, sodium chloride, acetaminophen, albuterol, alum & mag hydroxide-simeth **AND** [DISCONTINUED] lidocaine, nitroGLYCERIN, ondansetron  (ZOFRAN) IV, sodium chloride flush, sodium chloride flush  DVT prophylaxis: Lovenox Code Status: Full code Family Communication:  Disposition Plan: Home when cleared by cardiology   Consultants:   Cardiology  Procedures:   Cath : pending  Antimicrobials:  None    Objective: Vitals:   07/12/18 2330 07/13/18 0125 07/13/18 0127 07/13/18 0810  BP: 132/63 137/85  (!) 100/42  Pulse: 64 65  66  Resp: 13 20  20   Temp:  99.3 F (37.4 C)    TempSrc:  Oral    SpO2: 93% 94%  91%  Weight:   (!) 146.3 kg   Height:   6' (1.829 m)     Intake/Output Summary (Last 24 hours) at 07/13/2018 1006 Last data filed at 07/13/2018 0800 Gross per 24 hour  Intake 320 ml  Output 475 ml  Net -155 ml   Filed Weights   07/13/18 0127  Weight: (!) 146.3 kg    Examination:  Constitutional: NAD Eyes: PERRL, lids and conjunctivae normal ENMT: Mucous membranes are moist. Neck: normal, supple Respiratory: Faint end expiratory wheezing, no crackles.  Shallow breathing. Cardiovascular: Regular rate and rhythm, no murmurs / rubs / gallops.  Trace LE edema.  Abdomen: no tenderness. Bowel sounds positive.  Musculoskeletal: no clubbing / cyanosis Skin: no rashes Neurologic: non focal deficits   Data Reviewed: I have independently reviewed following labs and imaging studies   CBC: Recent Labs  Lab 07/12/18 1153 07/13/18 0216  WBC 12.6* 11.7*  HGB 15.7 14.5  HCT 49.1 45.1  MCV 91.1 92.0  PLT 214 630   Basic Metabolic Panel: Recent Labs  Lab 07/12/18 1153 07/13/18 0216  NA 137 138  K 3.8 4.2  CL 98 100  CO2 30 29  GLUCOSE 97 110*  BUN 14 16  CREATININE 1.16 1.48*  CALCIUM 10.3 9.5   GFR: Estimated Creatinine Clearance: 65.1 mL/min (A) (by C-G formula based on SCr of 1.48 mg/dL (H)). Liver Function Tests: Recent Labs  Lab 07/12/18 1634 07/13/18 0216  AST 21 21  ALT 20 22  ALKPHOS 53 48  BILITOT 2.2* 2.0*  PROT 7.8 6.3*  ALBUMIN 3.7 3.1*   Recent Labs  Lab  07/12/18 1634  LIPASE 58*   No results for input(s): AMMONIA in the last 168 hours. Coagulation Profile: No results for input(s): INR, PROTIME in the last 168 hours. Cardiac Enzymes: Recent Labs  Lab 07/12/18 2023 07/13/18 0216  TROPONINI <0.03 <0.03   BNP (last 3 results) No results for input(s): PROBNP in the last 8760 hours. HbA1C: No results for input(s): HGBA1C in the last 72 hours. CBG: No results for input(s): GLUCAP in the last 168 hours. Lipid Profile: Recent Labs    07/13/18 0216  CHOL 140  HDL 35*  LDLCALC 75  TRIG 152*  CHOLHDL 4.0   Thyroid Function Tests: Recent Labs    07/13/18 0216  TSH 1.915   Anemia Panel: No results for input(s): VITAMINB12, FOLATE, FERRITIN, TIBC, IRON, RETICCTPCT in the last 72 hours. Urine analysis:    Component Value Date/Time   COLORURINE YELLOW 07/12/2018 2024   APPEARANCEUR CLEAR 07/12/2018  2024   LABSPEC >1.046 (H) 07/12/2018 2024   PHURINE 5.0 07/12/2018 2024   GLUCOSEU NEGATIVE 07/12/2018 2024   HGBUR NEGATIVE 07/12/2018 2024   BILIRUBINUR NEGATIVE 07/12/2018 2024   KETONESUR NEGATIVE 07/12/2018 2024   PROTEINUR 100 (A) 07/12/2018 2024   UROBILINOGEN 0.2 03/24/2009 2110   NITRITE NEGATIVE 07/12/2018 2024   LEUKOCYTESUR NEGATIVE 07/12/2018 2024   Sepsis Labs: Invalid input(s): PROCALCITONIN, LACTICIDVEN  No results found for this or any previous visit (from the past 240 hour(s)).    Radiology Studies: Dg Chest 2 View  Result Date: 07/12/2018 CLINICAL DATA:  Chest pain. Cough and shortness of breath for the last 2 days. EXAM: CHEST - 2 VIEW COMPARISON:  One-view chest x-ray 11/01/2017 FINDINGS: The heart is enlarged. Mild pulmonary vascular congestion is present. There are no effusions. No focal airspace disease is present. Visualized soft tissues and bony thorax are unremarkable. IMPRESSION: 1. Cardiomegaly and mild pulmonary vascular congestion without frank edema. 2. No focal airspace disease. Electronically  Signed   By: San Morelle M.D.   On: 07/12/2018 12:33   Ct Angio Chest Pe W/cm &/or Wo Cm  Result Date: 07/12/2018 CLINICAL DATA:  Chest and abdominal pain with shortness of breath and dizziness. EXAM: CT ANGIOGRAPHY CHEST CT ABDOMEN AND PELVIS WITH CONTRAST TECHNIQUE: Multidetector CT imaging of the chest was performed using the standard protocol during bolus administration of intravenous contrast. Multiplanar CT image reconstructions and MIPs were obtained to evaluate the vascular anatomy. Multidetector CT imaging of the abdomen and pelvis was performed using the standard protocol during bolus administration of intravenous contrast. CONTRAST:  182mL ISOVUE-370 IOPAMIDOL (ISOVUE-370) INJECTION 76% COMPARISON:  Chest CT 04/15/2017 FINDINGS: CTA CHEST FINDINGS Cardiovascular: The heart is upper limits of normal in size and stable. Stable mild tortuosity, ectasia and calcification of the thoracic aorta. The branch vessels are patent. Stable coronary artery calcifications and coronary artery stents. The pulmonary arterial tree is fairly well opacified. No filling defects to suggest pulmonary embolism. Mediastinum/Nodes: No mediastinal or hilar mass or adenopathy. The esophagus is unremarkable. Lungs/Pleura: Limited by breathing motion artifact but no obvious infiltrates or pulmonary lesions. Patchy basilar subsegmental atelectasis and some scarring changes. No pleural effusion. Musculoskeletal: No significant bony findings. Review of the MIP images confirms the above findings. CT ABDOMEN and PELVIS FINDINGS Hepatobiliary: No focal hepatic lesions or intrahepatic biliary dilatation. The gallbladder is surgically absent. No common bile duct dilatation. Pancreas: No mass, inflammation or ductal dilatation. Spleen: Normal size.  No focal lesions. Adrenals/Urinary Tract: The adrenal glands and kidneys are unremarkable and stable. No worrisome renal lesions or hydronephrosis. The bladder is unremarkable.  Stomach/Bowel: The stomach, duodenum, small bowel and colon are unremarkable. No acute inflammatory changes, mass lesions or obstructive findings. Descending and sigmoid colon diverticulosis but no findings for acute diverticulitis. The terminal ileum is normal. The appendix is surgically absent. Vascular/Lymphatic: Advanced atherosclerotic calcifications involving the aorta and iliac arteries. No aneurysm or dissection. The branch vessels are patent. The major venous structures are patent. Small scattered mesenteric and retroperitoneal lymph nodes but no mass or overt adenopathy. Reproductive: The prostate gland and seminal vesicles are unremarkable. Other: Borderline right-sided pelvic lymph nodes are noted. 12.5 mm lateral external iliac lymph node on image number 73. 11 mm obturator region lymph node on image number 78. 10 mm external iliac lymph node on image number 77. No inguinal lymphadenopathy. Musculoskeletal: No significant bony findings. Lumbar fusion hardware noted at L4-5. There is advanced degenerate disc disease at L5-S1. Moderate  bilateral hip joint degenerative changes. Review of the MIP images confirms the above findings. IMPRESSION: 1. No CT findings for acute pulmonary embolism. 2. Reflux of contrast material down the IVC may be due to tricuspid regurgitation. 3. No thoracic aortic aneurysm or dissection. Moderate atherosclerotic calcifications. 4. No acute pulmonary findings. Streaky bibasilar atelectasis and scarring. 5. No acute abdominal/pelvic findings, mass lesions or adenopathy. 6. Borderline right-sided external iliac lymph nodes. A repeat pelvic CT scan in 4 months may be helpful to re-evaluate. 7. Colonic diverticulosis without findings for acute diverticulitis. Electronically Signed   By: Marijo Sanes M.D.   On: 07/12/2018 17:26   Ct Abdomen Pelvis W Contrast  Result Date: 07/12/2018 CLINICAL DATA:  Chest and abdominal pain with shortness of breath and dizziness. EXAM: CT  ANGIOGRAPHY CHEST CT ABDOMEN AND PELVIS WITH CONTRAST TECHNIQUE: Multidetector CT imaging of the chest was performed using the standard protocol during bolus administration of intravenous contrast. Multiplanar CT image reconstructions and MIPs were obtained to evaluate the vascular anatomy. Multidetector CT imaging of the abdomen and pelvis was performed using the standard protocol during bolus administration of intravenous contrast. CONTRAST:  165mL ISOVUE-370 IOPAMIDOL (ISOVUE-370) INJECTION 76% COMPARISON:  Chest CT 04/15/2017 FINDINGS: CTA CHEST FINDINGS Cardiovascular: The heart is upper limits of normal in size and stable. Stable mild tortuosity, ectasia and calcification of the thoracic aorta. The branch vessels are patent. Stable coronary artery calcifications and coronary artery stents. The pulmonary arterial tree is fairly well opacified. No filling defects to suggest pulmonary embolism. Mediastinum/Nodes: No mediastinal or hilar mass or adenopathy. The esophagus is unremarkable. Lungs/Pleura: Limited by breathing motion artifact but no obvious infiltrates or pulmonary lesions. Patchy basilar subsegmental atelectasis and some scarring changes. No pleural effusion. Musculoskeletal: No significant bony findings. Review of the MIP images confirms the above findings. CT ABDOMEN and PELVIS FINDINGS Hepatobiliary: No focal hepatic lesions or intrahepatic biliary dilatation. The gallbladder is surgically absent. No common bile duct dilatation. Pancreas: No mass, inflammation or ductal dilatation. Spleen: Normal size.  No focal lesions. Adrenals/Urinary Tract: The adrenal glands and kidneys are unremarkable and stable. No worrisome renal lesions or hydronephrosis. The bladder is unremarkable. Stomach/Bowel: The stomach, duodenum, small bowel and colon are unremarkable. No acute inflammatory changes, mass lesions or obstructive findings. Descending and sigmoid colon diverticulosis but no findings for acute  diverticulitis. The terminal ileum is normal. The appendix is surgically absent. Vascular/Lymphatic: Advanced atherosclerotic calcifications involving the aorta and iliac arteries. No aneurysm or dissection. The branch vessels are patent. The major venous structures are patent. Small scattered mesenteric and retroperitoneal lymph nodes but no mass or overt adenopathy. Reproductive: The prostate gland and seminal vesicles are unremarkable. Other: Borderline right-sided pelvic lymph nodes are noted. 12.5 mm lateral external iliac lymph node on image number 73. 11 mm obturator region lymph node on image number 78. 10 mm external iliac lymph node on image number 77. No inguinal lymphadenopathy. Musculoskeletal: No significant bony findings. Lumbar fusion hardware noted at L4-5. There is advanced degenerate disc disease at L5-S1. Moderate bilateral hip joint degenerative changes. Review of the MIP images confirms the above findings. IMPRESSION: 1. No CT findings for acute pulmonary embolism. 2. Reflux of contrast material down the IVC may be due to tricuspid regurgitation. 3. No thoracic aortic aneurysm or dissection. Moderate atherosclerotic calcifications. 4. No acute pulmonary findings. Streaky bibasilar atelectasis and scarring. 5. No acute abdominal/pelvic findings, mass lesions or adenopathy. 6. Borderline right-sided external iliac lymph nodes. A repeat pelvic CT scan in  4 months may be helpful to re-evaluate. 7. Colonic diverticulosis without findings for acute diverticulitis. Electronically Signed   By: Marijo Sanes M.D.   On: 07/12/2018 17:26     Marzetta Board, MD, PhD [facility]  Contact via  [email or web address]  [place] P: [phone]  F: [phone]

## 2018-07-13 NOTE — Plan of Care (Signed)
  Problem: Education: Goal: Ability to demonstrate management of disease process will improve Outcome: Progressing Goal: Ability to verbalize understanding of medication therapies will improve Outcome: Progressing   Problem: Education: Goal: Knowledge of General Education information will improve Description Including pain rating scale, medication(s)/side effects and non-pharmacologic comfort measures Outcome: Progressing   Problem: Health Behavior/Discharge Planning: Goal: Ability to manage health-related needs will improve Outcome: Progressing   Problem: Clinical Measurements: Goal: Will remain free from infection Outcome: Progressing Goal: Diagnostic test results will improve Outcome: Progressing Goal: Respiratory complications will improve Outcome: Progressing   Problem: Activity: Goal: Risk for activity intolerance will decrease Outcome: Progressing   Problem: Nutrition: Goal: Adequate nutrition will be maintained Outcome: Progressing   Problem: Coping: Goal: Level of anxiety will decrease Outcome: Progressing   Problem: Elimination: Goal: Will not experience complications related to bowel motility Outcome: Progressing Goal: Will not experience complications related to urinary retention Outcome: Progressing   Problem: Pain Managment: Goal: General experience of comfort will improve Outcome: Progressing   Problem: Safety: Goal: Ability to remain free from injury will improve Outcome: Progressing   Problem: Skin Integrity: Goal: Risk for impaired skin integrity will decrease Outcome: Progressing

## 2018-07-13 NOTE — Progress Notes (Addendum)
DAILY PROGRESS NOTE   Patient Name: [name] Date of Encounter: 07/13/2018 Cardiologist: No primary care provider on file.  Chief Complaint   No chest pain today  Patient Profile   [name] is a 73 y.o. male with a hx of coronary artery disease w/ prior RCA stenting, prior Mobitz 1 second-degree AV block, hypertension, obstructive sleep apnea, obesity and asthma who is being seen today for the evaluation of chest pain and bradycardia at the request of Dr. Myna Hidalgo, Internal Medicine  Subjective   No pain today - creatinine increased overnight, but he has CKD with baseline creatinine between 1.2-1.5. Hydrated overnight - plans for possible cath today, pending repeat BMET. Will likely need EP evaluation - he had second degree Type 1 AVB-  Rates in the 30's/40's.   Objective   Vitals:   07/12/18 2330 07/13/18 0125 07/13/18 0127 07/13/18 0810  BP: 132/63 137/85  (!) 100/42  Pulse: 64 65  66  Resp: 13 20  20   Temp:  99.3 F (37.4 C)    TempSrc:  Oral    SpO2: 93% 94%  91%  Weight:   (!) 146.3 kg   Height:   6' (1.829 m)     Intake/Output Summary (Last 24 hours) at 07/13/2018 1012 Last data filed at 07/13/2018 0800 Gross per 24 hour  Intake 320 ml  Output 475 ml  Net -155 ml   Filed Weights   07/13/18 0127  Weight: (!) 146.3 kg    Physical Exam   General appearance: alert and morbidly obese Neck: JVD - 3 cm above sternal notch, no carotid bruit and thyroid not enlarged, symmetric, no tenderness/mass/nodules Lungs: diminished breath sounds bilaterally Heart: regular bradycardia Abdomen: soft, non-tender; bowel sounds normal; no masses,  no organomegaly Extremities: edema 2+ edema Pulses: 2+ and symmetric Skin: Skin color, texture, turgor normal. No rashes or lesions Neurologic: Grossly normal Psych: Pleasant  Inpatient Medications    Scheduled Meds: . allopurinol  300 mg Oral Daily  . aspirin EC  81 mg Oral Daily  . bisacodyl  10 mg Oral Daily  .  enoxaparin (LOVENOX) injection  40 mg Subcutaneous Q24H  . morphine  2 mg Intravenous Once  . pantoprazole  40 mg Oral Daily  . polyethylene glycol  17 g Oral BID  . senna  2 tablet Oral Daily  . sodium chloride flush  3 mL Intravenous Once  . sodium chloride flush  3 mL Intravenous Q12H  . sodium chloride flush  3 mL Intravenous Q12H  . umeclidinium bromide  1 puff Inhalation Daily    Continuous Infusions: . sodium chloride    . sodium chloride    . sodium chloride 1 mL/kg/hr (07/13/18 0605)    PRN Meds: sodium chloride, sodium chloride, acetaminophen, albuterol, alum & mag hydroxide-simeth **AND** [DISCONTINUED] lidocaine, nitroGLYCERIN, ondansetron (ZOFRAN) IV, sodium chloride flush, sodium chloride flush   Labs   Results for orders placed or performed during the hospital encounter of 07/12/18 (from the past 48 hour(s))  Basic metabolic panel     Status: None   Collection Time: 07/12/18 11:53 AM  Result Value Ref Range   Sodium 137 135 - 145 mmol/L   Potassium 3.8 3.5 - 5.1 mmol/L   Chloride 98 98 - 111 mmol/L   CO2 30 22 - 32 mmol/L   Glucose, Bld 97 70 - 99 mg/dL   BUN 14 8 - 23 mg/dL   Creatinine, Ser 1.16 0.61 - 1.24 mg/dL  Calcium 10.3 8.9 - 10.3 mg/dL   GFR calc non Af Amer >60 >60 mL/min   GFR calc Af Amer >60 >60 mL/min   Anion gap 9 5 - 15    Comment: Performed at [facility] [address] [place], [place] [address]  CBC     Status: Abnormal   Collection Time: 07/12/18 11:53 AM  Result Value Ref Range   WBC 12.6 (H) 4.0 - 10.5 K/uL   RBC 5.39 4.22 - 5.81 MIL/uL   Hemoglobin 15.7 13.0 - 17.0 g/dL   HCT 49.1 39.0 - 52.0 %   MCV 91.1 80.0 - 100.0 fL   MCH 29.1 26.0 - 34.0 pg   MCHC 32.0 30.0 - 36.0 g/dL   RDW 13.4 11.5 - 15.5 %   Platelets 214 150 - 400 K/uL   nRBC 0.0 0.0 - 0.2 %    Comment: Performed at [facility], [place] [address], [place], [place] [address]  I-stat troponin, ED     Status: None   Collection Time: 07/12/18 12:02 PM    Result Value Ref Range   Troponin i, poc 0.01 0.00 - 0.08 ng/mL   Comment 3            Comment: Due to the release kinetics of cTnI, a negative result within the first hours of the onset of symptoms does not rule out myocardial infarction with certainty. If myocardial infarction is still suspected, repeat the test at appropriate intervals.   Hepatic function panel     Status: Abnormal   Collection Time: 07/12/18  4:34 PM  Result Value Ref Range   Total Protein 7.8 6.5 - 8.1 g/dL   Albumin 3.7 3.5 - 5.0 g/dL   AST 21 15 - 41 U/L   ALT 20 0 - 44 U/L   Alkaline Phosphatase 53 38 - 126 U/L   Total Bilirubin 2.2 (H) 0.3 - 1.2 mg/dL   Bilirubin, Direct 0.4 (H) 0.0 - 0.2 mg/dL   Indirect Bilirubin 1.8 (H) 0.3 - 0.9 mg/dL    Comment: Performed at [facility] [address], [place], [place] [address]  Lipase, blood     Status: Abnormal   Collection Time: 07/12/18  4:34 PM  Result Value Ref Range   Lipase 58 (H) 11 - 51 U/L    Comment: Performed at [facility] [address], [place], [place] [address]  I-stat troponin, ED     Status: None   Collection Time: 07/12/18  4:43 PM  Result Value Ref Range   Troponin i, poc 0.01 0.00 - 0.08 ng/mL   Comment 3            Comment: Due to the release kinetics of cTnI, a negative result within the first hours of the onset of symptoms does not rule out myocardial infarction with certainty. If myocardial infarction is still suspected, repeat the test at appropriate intervals.   Troponin I - Now Then Q6H     Status: None   Collection Time: 07/12/18  8:23 PM  Result Value Ref Range   Troponin I <0.03 <0.03 ng/mL    Comment: Performed at [facility] [address], [place], [place] [address]  Urinalysis, Routine w reflex microscopic     Status: Abnormal   Collection Time: 07/12/18  8:24 PM  Result Value Ref Range   Color, Urine YELLOW YELLOW   APPearance CLEAR CLEAR   Specific Gravity, Urine >1.046 (H)  1.005 - 1.030    pH 5.0 5.0 - 8.0   Glucose, UA NEGATIVE NEGATIVE mg/dL   Hgb urine dipstick NEGATIVE NEGATIVE   Bilirubin Urine NEGATIVE NEGATIVE   Ketones, ur NEGATIVE NEGATIVE mg/dL   Protein, ur 100 (A) NEGATIVE mg/dL   Nitrite NEGATIVE NEGATIVE   Leukocytes, UA NEGATIVE NEGATIVE   RBC / HPF 0-5 0 - 5 RBC/hpf   WBC, UA 0-5 0 - 5 WBC/hpf   Bacteria, UA NONE SEEN NONE SEEN    Comment: Performed at [facility] [address], [place], [place] [address]  Comprehensive metabolic panel     Status: Abnormal   Collection Time: 07/13/18  2:16 AM  Result Value Ref Range   Sodium 138 135 - 145 mmol/L   Potassium 4.2 3.5 - 5.1 mmol/L   Chloride 100 98 - 111 mmol/L   CO2 29 22 - 32 mmol/L   Glucose, Bld 110 (H) 70 - 99 mg/dL   BUN 16 8 - 23 mg/dL   Creatinine, Ser 1.48 (H) 0.61 - 1.24 mg/dL   Calcium 9.5 8.9 - 10.3 mg/dL   Total Protein 6.3 (L) 6.5 - 8.1 g/dL   Albumin 3.1 (L) 3.5 - 5.0 g/dL   AST 21 15 - 41 U/L   ALT 22 0 - 44 U/L   Alkaline Phosphatase 48 38 - 126 U/L   Total Bilirubin 2.0 (H) 0.3 - 1.2 mg/dL   GFR calc non Af Amer 46 (L) >60 mL/min   GFR calc Af Amer 53 (L) >60 mL/min   Anion gap 9 5 - 15    Comment: Performed at [facility] [address], [place], [place] [address]  CBC     Status: Abnormal   Collection Time: 07/13/18  2:16 AM  Result Value Ref Range   WBC 11.7 (H) 4.0 - 10.5 K/uL   RBC 4.90 4.22 - 5.81 MIL/uL   Hemoglobin 14.5 13.0 - 17.0 g/dL   HCT 45.1 39.0 - 52.0 %   MCV 92.0 80.0 - 100.0 fL   MCH 29.6 26.0 - 34.0 pg   MCHC 32.2 30.0 - 36.0 g/dL   RDW 13.6 11.5 - 15.5 %   Platelets 183 150 - 400 K/uL   nRBC 0.0 0.0 - 0.2 %    Comment: Performed at [facility], [place] [address], [place], [place] [address]  Troponin I - Now Then Q6H     Status: None   Collection Time: 07/13/18  2:16 AM  Result Value Ref Range   Troponin I <0.03 <0.03 ng/mL    Comment: Performed at [facility] [address] [place], [place] [address]  Lipid panel      Status: Abnormal   Collection Time: 07/13/18  2:16 AM  Result Value Ref Range   Cholesterol 140 0 - 200 mg/dL   Triglycerides 152 (H) <150 mg/dL   HDL 35 (L) >40 mg/dL   Total CHOL/HDL Ratio 4.0 RATIO   VLDL 30 0 - 40 mg/dL   LDL Cholesterol 75 0 - 99 mg/dL    Comment:        Total Cholesterol/HDL:CHD Risk Coronary Heart Disease Risk Table                     Men   Women  1/2 Average Risk   3.4   3.3  Average Risk       5.0   4.4  2 X Average  Risk   9.6   7.1  3 X Average Risk  23.4   11.0        Use the calculated Patient Ratio above and the CHD Risk Table to determine the patient's CHD Risk.        ATP III CLASSIFICATION (LDL):  <100     mg/dL   Optimal  100-129  mg/dL   Near or Above                    Optimal  130-159  mg/dL   Borderline  160-189  mg/dL   High  >190     mg/dL   Very High Performed at [facility] [address], [place], [place] [address]   TSH     Status: None   Collection Time: 07/13/18  2:16 AM  Result Value Ref Range   TSH 1.915 0.350 - 4.500 uIU/mL    Comment: Performed by a 3rd Generation assay with a functional sensitivity of <=0.01 uIU/mL. Performed at [facility], [place] [address], [place], [place] [address]     ECG   Sinus bradycardia with Mobitz 2, type I AVB - Personally Reviewed  Telemetry   Mobitz 2, type 1 AVB - Personally Reviewed  Radiology    Dg Chest 2 View  Result Date: 07/12/2018 CLINICAL DATA:  Chest pain. Cough and shortness of breath for the last 2 days. EXAM: CHEST - 2 VIEW COMPARISON:  One-view chest x-ray 11/01/2017 FINDINGS: The heart is enlarged. Mild pulmonary vascular congestion is present. There are no effusions. No focal airspace disease is present. Visualized soft tissues and bony thorax are unremarkable. IMPRESSION: 1. Cardiomegaly and mild pulmonary vascular congestion without frank edema. 2. No focal airspace disease. Electronically Signed   By: San Morelle M.D.   On: 07/12/2018 12:33    Ct Angio Chest Pe W/cm &/or Wo Cm  Result Date: 07/12/2018 CLINICAL DATA:  Chest and abdominal pain with shortness of breath and dizziness. EXAM: CT ANGIOGRAPHY CHEST CT ABDOMEN AND PELVIS WITH CONTRAST TECHNIQUE: Multidetector CT imaging of the chest was performed using the standard protocol during bolus administration of intravenous contrast. Multiplanar CT image reconstructions and MIPs were obtained to evaluate the vascular anatomy. Multidetector CT imaging of the abdomen and pelvis was performed using the standard protocol during bolus administration of intravenous contrast. CONTRAST:  1102mL ISOVUE-370 IOPAMIDOL (ISOVUE-370) INJECTION 76% COMPARISON:  Chest CT 04/15/2017 FINDINGS: CTA CHEST FINDINGS Cardiovascular: The heart is upper limits of normal in size and stable. Stable mild tortuosity, ectasia and calcification of the thoracic aorta. The branch vessels are patent. Stable coronary artery calcifications and coronary artery stents. The pulmonary arterial tree is fairly well opacified. No filling defects to suggest pulmonary embolism. Mediastinum/Nodes: No mediastinal or hilar mass or adenopathy. The esophagus is unremarkable. Lungs/Pleura: Limited by breathing motion artifact but no obvious infiltrates or pulmonary lesions. Patchy basilar subsegmental atelectasis and some scarring changes. No pleural effusion. Musculoskeletal: No significant bony findings. Review of the MIP images confirms the above findings. CT ABDOMEN and PELVIS FINDINGS Hepatobiliary: No focal hepatic lesions or intrahepatic biliary dilatation. The gallbladder is surgically absent. No common bile duct dilatation. Pancreas: No mass, inflammation or ductal dilatation. Spleen: Normal size.  No focal lesions. Adrenals/Urinary Tract: The adrenal glands and kidneys are unremarkable and stable. No worrisome renal lesions or hydronephrosis. The bladder is unremarkable. Stomach/Bowel: The stomach, duodenum, small bowel and colon are  unremarkable. No acute inflammatory changes, mass lesions  or obstructive findings. Descending and sigmoid colon diverticulosis but no findings for acute diverticulitis. The terminal ileum is normal. The appendix is surgically absent. Vascular/Lymphatic: Advanced atherosclerotic calcifications involving the aorta and iliac arteries. No aneurysm or dissection. The branch vessels are patent. The major venous structures are patent. Small scattered mesenteric and retroperitoneal lymph nodes but no mass or overt adenopathy. Reproductive: The prostate gland and seminal vesicles are unremarkable. Other: Borderline right-sided pelvic lymph nodes are noted. 12.5 mm lateral external iliac lymph node on image number 73. 11 mm obturator region lymph node on image number 78. 10 mm external iliac lymph node on image number 77. No inguinal lymphadenopathy. Musculoskeletal: No significant bony findings. Lumbar fusion hardware noted at L4-5. There is advanced degenerate disc disease at L5-S1. Moderate bilateral hip joint degenerative changes. Review of the MIP images confirms the above findings. IMPRESSION: 1. No CT findings for acute pulmonary embolism. 2. Reflux of contrast material down the IVC may be due to tricuspid regurgitation. 3. No thoracic aortic aneurysm or dissection. Moderate atherosclerotic calcifications. 4. No acute pulmonary findings. Streaky bibasilar atelectasis and scarring. 5. No acute abdominal/pelvic findings, mass lesions or adenopathy. 6. Borderline right-sided external iliac lymph nodes. A repeat pelvic CT scan in 4 months may be helpful to re-evaluate. 7. Colonic diverticulosis without findings for acute diverticulitis. Electronically Signed   By: Marijo Sanes M.D.   On: 07/12/2018 17:26   Ct Abdomen Pelvis W Contrast  Result Date: 07/12/2018 CLINICAL DATA:  Chest and abdominal pain with shortness of breath and dizziness. EXAM: CT ANGIOGRAPHY CHEST CT ABDOMEN AND PELVIS WITH CONTRAST TECHNIQUE:  Multidetector CT imaging of the chest was performed using the standard protocol during bolus administration of intravenous contrast. Multiplanar CT image reconstructions and MIPs were obtained to evaluate the vascular anatomy. Multidetector CT imaging of the abdomen and pelvis was performed using the standard protocol during bolus administration of intravenous contrast. CONTRAST:  149mL ISOVUE-370 IOPAMIDOL (ISOVUE-370) INJECTION 76% COMPARISON:  Chest CT 04/15/2017 FINDINGS: CTA CHEST FINDINGS Cardiovascular: The heart is upper limits of normal in size and stable. Stable mild tortuosity, ectasia and calcification of the thoracic aorta. The branch vessels are patent. Stable coronary artery calcifications and coronary artery stents. The pulmonary arterial tree is fairly well opacified. No filling defects to suggest pulmonary embolism. Mediastinum/Nodes: No mediastinal or hilar mass or adenopathy. The esophagus is unremarkable. Lungs/Pleura: Limited by breathing motion artifact but no obvious infiltrates or pulmonary lesions. Patchy basilar subsegmental atelectasis and some scarring changes. No pleural effusion. Musculoskeletal: No significant bony findings. Review of the MIP images confirms the above findings. CT ABDOMEN and PELVIS FINDINGS Hepatobiliary: No focal hepatic lesions or intrahepatic biliary dilatation. The gallbladder is surgically absent. No common bile duct dilatation. Pancreas: No mass, inflammation or ductal dilatation. Spleen: Normal size.  No focal lesions. Adrenals/Urinary Tract: The adrenal glands and kidneys are unremarkable and stable. No worrisome renal lesions or hydronephrosis. The bladder is unremarkable. Stomach/Bowel: The stomach, duodenum, small bowel and colon are unremarkable. No acute inflammatory changes, mass lesions or obstructive findings. Descending and sigmoid colon diverticulosis but no findings for acute diverticulitis. The terminal ileum is normal. The appendix is surgically  absent. Vascular/Lymphatic: Advanced atherosclerotic calcifications involving the aorta and iliac arteries. No aneurysm or dissection. The branch vessels are patent. The major venous structures are patent. Small scattered mesenteric and retroperitoneal lymph nodes but no mass or overt adenopathy. Reproductive: The prostate gland and seminal vesicles are unremarkable. Other: Borderline right-sided pelvic lymph nodes are noted.  12.5 mm lateral external iliac lymph node on image number 73. 11 mm obturator region lymph node on image number 78. 10 mm external iliac lymph node on image number 77. No inguinal lymphadenopathy. Musculoskeletal: No significant bony findings. Lumbar fusion hardware noted at L4-5. There is advanced degenerate disc disease at L5-S1. Moderate bilateral hip joint degenerative changes. Review of the MIP images confirms the above findings. IMPRESSION: 1. No CT findings for acute pulmonary embolism. 2. Reflux of contrast material down the IVC may be due to tricuspid regurgitation. 3. No thoracic aortic aneurysm or dissection. Moderate atherosclerotic calcifications. 4. No acute pulmonary findings. Streaky bibasilar atelectasis and scarring. 5. No acute abdominal/pelvic findings, mass lesions or adenopathy. 6. Borderline right-sided external iliac lymph nodes. A repeat pelvic CT scan in 4 months may be helpful to re-evaluate. 7. Colonic diverticulosis without findings for acute diverticulitis. Electronically Signed   By: Marijo Sanes M.D.   On: 07/12/2018 17:26    Cardiac Studies   Pending  Assessment   1. Principal Problem: 2.   Chest pain 3. Active Problems: 4.   CAD (coronary artery disease) 5.   OSA (obstructive sleep apnea) 6.   Heart block AV second degree 7.   History of peptic ulcer disease 8.   Asthma, chronic, unspecified asthma severity, with acute exacerbation 9.   Chronic respiratory failure with hypoxia (HCC) 10.   Abnormal EKG 11.   Hypertension 12.   Plan    1. Chest pain better today - HR improved, but more AV block overnight. Plans for cath today - he has CKD, so creatinine is probably close to baseline. Will reach out to EP for recommendation as to whether a pacemaker may be necessary as per Dr. Victorino December recommendations.  Time Spent Directly with Patient:  I have spent a total of 25 minutes with the patient reviewing hospital notes, telemetry, EKGs, labs and examining the patient as well as establishing an assessment and plan that was discussed personally with the patient.  > 50% of time was spent in direct patient care.  Length of Stay:  LOS: 0 days   Pixie Casino, MD, [facility], [place] Director of the Advanced Lipid Disorders &  Cardiovascular Risk Reduction Clinic Diplomate of the American Board of Clinical Lipidology Attending Cardiologist  Direct Dial: [phone]  Fax: [phone]  Website:  [email or web address]  Nadean Corwin [facility] 07/13/2018, 10:12 AM

## 2018-07-13 NOTE — Interval H&P Note (Signed)
History and Physical Interval Note:  07/13/2018 3:48 PM  [name]  has presented today for surgery, with the diagnosis of unstable angina  The various methods of treatment have been discussed with the patient and family. After consideration of risks, benefits and other options for treatment, the patient has consented to  Procedure(s): LEFT HEART CATH AND CORONARY ANGIOGRAPHY (N/A) as a surgical intervention .  The patient's history has been reviewed, patient examined, no change in status, stable for surgery.  I have reviewed the patient's chart and labs.  Questions were answered to the patient's satisfaction.   Cath Lab Visit (complete for each Cath Lab visit)  Clinical Evaluation Leading to the Procedure:   ACS: Yes.    Non-ACS:    Anginal Classification: CCS III  Anti-ischemic medical therapy: Minimal Therapy (1 class of medications)  Non-Invasive Test Results: No non-invasive testing performed  Prior CABG: No previous CABG        [name] 07/13/2018 3:48 PM

## 2018-07-13 NOTE — Progress Notes (Signed)
Pt refuses CPAP 

## 2018-07-14 ENCOUNTER — Encounter (HOSPITAL_COMMUNITY): Payer: Self-pay | Admitting: Cardiology

## 2018-07-14 DIAGNOSIS — E785 Hyperlipidemia, unspecified: Secondary | ICD-10-CM | POA: Diagnosis not present

## 2018-07-14 DIAGNOSIS — N183 Chronic kidney disease, stage 3 (moderate): Secondary | ICD-10-CM | POA: Diagnosis not present

## 2018-07-14 DIAGNOSIS — I441 Atrioventricular block, second degree: Secondary | ICD-10-CM | POA: Diagnosis not present

## 2018-07-14 DIAGNOSIS — I1310 Hypertensive heart and chronic kidney disease without heart failure, with stage 1 through stage 4 chronic kidney disease, or unspecified chronic kidney disease: Secondary | ICD-10-CM | POA: Diagnosis not present

## 2018-07-14 DIAGNOSIS — R0789 Other chest pain: Secondary | ICD-10-CM

## 2018-07-14 DIAGNOSIS — R0989 Other specified symptoms and signs involving the circulatory and respiratory systems: Secondary | ICD-10-CM | POA: Diagnosis not present

## 2018-07-14 DIAGNOSIS — I25110 Atherosclerotic heart disease of native coronary artery with unstable angina pectoris: Secondary | ICD-10-CM | POA: Diagnosis not present

## 2018-07-14 DIAGNOSIS — K5730 Diverticulosis of large intestine without perforation or abscess without bleeding: Secondary | ICD-10-CM | POA: Diagnosis not present

## 2018-07-14 DIAGNOSIS — I2510 Atherosclerotic heart disease of native coronary artery without angina pectoris: Secondary | ICD-10-CM | POA: Diagnosis not present

## 2018-07-14 DIAGNOSIS — Z955 Presence of coronary angioplasty implant and graft: Secondary | ICD-10-CM | POA: Diagnosis not present

## 2018-07-14 DIAGNOSIS — Z87891 Personal history of nicotine dependence: Secondary | ICD-10-CM | POA: Diagnosis not present

## 2018-07-14 LAB — HEPATITIS PANEL, ACUTE
HCV Ab: <0.1 s/co ratio (ref 0.0–0.9)
Hep A IgM: NEGATIVE
Hep B C IgM: NEGATIVE
Hepatitis B Surface Ag: NEGATIVE

## 2018-07-14 LAB — CBC
HCT: 43.4 % (ref 39.0–52.0)
Hemoglobin: 13.6 g/dL (ref 13.0–17.0)
MCH: 29.5 pg (ref 26.0–34.0)
MCHC: 31.3 g/dL (ref 30.0–36.0)
MCV: 94.1 fL (ref 80.0–100.0)
PLATELETS: 184 10*3/uL (ref 150–400)
RBC: 4.61 MIL/uL (ref 4.22–5.81)
RDW: 13.4 % (ref 11.5–15.5)
WBC: 11.5 10*3/uL — ABNORMAL HIGH (ref 4.0–10.5)
nRBC: 0 % (ref 0.0–0.2)

## 2018-07-14 LAB — BASIC METABOLIC PANEL
Anion gap: 8 (ref 5–15)
BUN: 15 mg/dL (ref 8–23)
CO2: 29 mmol/L (ref 22–32)
CREATININE: 1.23 mg/dL (ref 0.61–1.24)
Calcium: 9.5 mg/dL (ref 8.9–10.3)
Chloride: 100 mmol/L (ref 98–111)
GFR calc Af Amer: >60 mL/min (ref >60)
GFR, EST NON AFRICAN AMERICAN: 57 mL/min — AB (ref >60)
Glucose, Bld: 97 mg/dL (ref 70–99)
Potassium: 4 mmol/L (ref 3.5–5.1)
Sodium: 137 mmol/L (ref 135–145)

## 2018-07-14 MED ORDER — GLYCERIN (LAXATIVE) 2.1 G RE SUPP
1.0000 | Freq: Once | RECTAL | Status: DC
  Filled 2018-07-14: qty 1

## 2018-07-14 NOTE — Progress Notes (Signed)
DAILY PROGRESS NOTE   Patient Name: [name] Date of Encounter: 07/14/2018 Cardiologist: No primary care provider on file.  Chief Complaint   No chest pain today  Patient Profile   [name] is a 73 y.o. male with a hx of coronary artery disease w/ prior RCA stenting, prior Mobitz 1 second-degree AV block, hypertension, obstructive sleep apnea, obesity and asthma who is being seen today for the evaluation of chest pain and bradycardia at the request of Dr. Myna Hidalgo, Internal Medicine  Subjective   Appreciate EP evaluation - no recommendations for pacing. Cath yesterday shows moderate CAD with occluded distal LCx. No targets for PCI - medical therapy was recommended. LV function is normal.  Objective   Vitals:   07/14/18 0131 07/14/18 0435 07/14/18 0808 07/14/18 0827  BP: (!) 145/67 (!) 123/54 126/74   Pulse: 97 (!) 54  (!) 55  Resp: 20 20  17   Temp: 99.5 F (37.5 C) 98.8 F (37.1 C)    TempSrc: Oral Oral    SpO2: 93% 93%  92%  Weight:      Height:        Intake/Output Summary (Last 24 hours) at 07/14/2018 3810 Last data filed at 07/14/2018 0800 Gross per 24 hour  Intake 2155.08 ml  Output 1175 ml  Net 980.08 ml   Filed Weights   07/13/18 0127 07/14/18 0131  Weight: (!) 146.3 kg (!) 142.7 kg    Physical Exam   General appearance: alert and morbidly obese Neck: JVD - 3 cm above sternal notch, no carotid bruit and thyroid not enlarged, symmetric, no tenderness/mass/nodules Lungs: diminished breath sounds bilaterally Heart: regular bradycardia Abdomen: soft, non-tender; bowel sounds normal; no masses,  no organomegaly Extremities: edema 2+ edema Pulses: 2+ and symmetric Skin: Skin color, texture, turgor normal. No rashes or lesions Neurologic: Grossly normal Psych: Pleasant  Inpatient Medications    Scheduled Meds: . allopurinol  300 mg Oral Daily  . aspirin EC  81 mg Oral Daily  . bisacodyl  10 mg Oral Daily  . enoxaparin (LOVENOX) injection  40  mg Subcutaneous Q24H  . ipratropium-albuterol  3 mL Nebulization BID  . morphine  2 mg Intravenous Once  . pantoprazole  40 mg Oral Daily  . polyethylene glycol  17 g Oral BID  . senna  2 tablet Oral Daily  . sodium chloride flush  3 mL Intravenous Once  . sodium chloride flush  3 mL Intravenous Q12H  . sodium chloride flush  3 mL Intravenous Q12H    Continuous Infusions: . sodium chloride    . sodium chloride      PRN Meds: sodium chloride, sodium chloride, acetaminophen, albuterol, alum & mag hydroxide-simeth **AND** [DISCONTINUED] lidocaine, nitroGLYCERIN, ondansetron (ZOFRAN) IV, sodium chloride flush, sodium chloride flush   Labs   Results for orders placed or performed during the hospital encounter of 07/12/18 (from the past 48 hour(s))  Basic metabolic panel     Status: None   Collection Time: 07/12/18 11:53 AM  Result Value Ref Range   Sodium 137 135 - 145 mmol/L   Potassium 3.8 3.5 - 5.1 mmol/L   Chloride 98 98 - 111 mmol/L   CO2 30 22 - 32 mmol/L   Glucose, Bld 97 70 - 99 mg/dL   BUN 14 8 - 23 mg/dL   Creatinine, Ser 1.16 0.61 - 1.24 mg/dL   Calcium 10.3 8.9 - 10.3 mg/dL   GFR calc non Af Amer >60 >60 mL/min  GFR calc Af Amer >60 >60 mL/min   Anion gap 9 5 - 15    Comment: Performed at [facility] [address], [place], [place] [address]  CBC     Status: Abnormal   Collection Time: 07/12/18 11:53 AM  Result Value Ref Range   WBC 12.6 (H) 4.0 - 10.5 K/uL   RBC 5.39 4.22 - 5.81 MIL/uL   Hemoglobin 15.7 13.0 - 17.0 g/dL   HCT 49.1 39.0 - 52.0 %   MCV 91.1 80.0 - 100.0 fL   MCH 29.1 26.0 - 34.0 pg   MCHC 32.0 30.0 - 36.0 g/dL   RDW 13.4 11.5 - 15.5 %   Platelets 214 150 - 400 K/uL   nRBC 0.0 0.0 - 0.2 %    Comment: Performed at [facility], [place] [address], [place], [place] [address]  I-stat troponin, ED     Status: None   Collection Time: 07/12/18 12:02 PM  Result Value Ref Range   Troponin i, poc 0.01 0.00 - 0.08 ng/mL   Comment 3             Comment: Due to the release kinetics of cTnI, a negative result within the first hours of the onset of symptoms does not rule out myocardial infarction with certainty. If myocardial infarction is still suspected, repeat the test at appropriate intervals.   Hepatic function panel     Status: Abnormal   Collection Time: 07/12/18  4:34 PM  Result Value Ref Range   Total Protein 7.8 6.5 - 8.1 g/dL   Albumin 3.7 3.5 - 5.0 g/dL   AST 21 15 - 41 U/L   ALT 20 0 - 44 U/L   Alkaline Phosphatase 53 38 - 126 U/L   Total Bilirubin 2.2 (H) 0.3 - 1.2 mg/dL   Bilirubin, Direct 0.4 (H) 0.0 - 0.2 mg/dL   Indirect Bilirubin 1.8 (H) 0.3 - 0.9 mg/dL    Comment: Performed at [facility] [address], [place], [place] [address]  Lipase, blood     Status: Abnormal   Collection Time: 07/12/18  4:34 PM  Result Value Ref Range   Lipase 58 (H) 11 - 51 U/L    Comment: Performed at [facility] [address] [place], [place] [address]  I-stat troponin, ED     Status: None   Collection Time: 07/12/18  4:43 PM  Result Value Ref Range   Troponin i, poc 0.01 0.00 - 0.08 ng/mL   Comment 3            Comment: Due to the release kinetics of cTnI, a negative result within the first hours of the onset of symptoms does not rule out myocardial infarction with certainty. If myocardial infarction is still suspected, repeat the test at appropriate intervals.   Troponin I - Now Then Q6H     Status: None   Collection Time: 07/12/18  8:23 PM  Result Value Ref Range   Troponin I <0.03 <0.03 ng/mL    Comment: Performed at [facility] [address] [place], [place] [address]  Urinalysis, Routine w reflex microscopic     Status: Abnormal   Collection Time: 07/12/18  8:24 PM  Result Value Ref Range   Color, Urine YELLOW YELLOW   APPearance CLEAR CLEAR   Specific Gravity, Urine >1.046 (H) 1.005 - 1.030   pH 5.0 5.0 - 8.0   Glucose, UA NEGATIVE NEGATIVE mg/dL  Hgb urine dipstick  NEGATIVE NEGATIVE   Bilirubin Urine NEGATIVE NEGATIVE   Ketones, ur NEGATIVE NEGATIVE mg/dL   Protein, ur 100 (A) NEGATIVE mg/dL   Nitrite NEGATIVE NEGATIVE   Leukocytes, UA NEGATIVE NEGATIVE   RBC / HPF 0-5 0 - 5 RBC/hpf   WBC, UA 0-5 0 - 5 WBC/hpf   Bacteria, UA NONE SEEN NONE SEEN    Comment: Performed at [facility] [address] [place], [place] [address]  Comprehensive metabolic panel     Status: Abnormal   Collection Time: 07/13/18  2:16 AM  Result Value Ref Range   Sodium 138 135 - 145 mmol/L   Potassium 4.2 3.5 - 5.1 mmol/L   Chloride 100 98 - 111 mmol/L   CO2 29 22 - 32 mmol/L   Glucose, Bld 110 (H) 70 - 99 mg/dL   BUN 16 8 - 23 mg/dL   Creatinine, Ser 1.48 (H) 0.61 - 1.24 mg/dL   Calcium 9.5 8.9 - 10.3 mg/dL   Total Protein 6.3 (L) 6.5 - 8.1 g/dL   Albumin 3.1 (L) 3.5 - 5.0 g/dL   AST 21 15 - 41 U/L   ALT 22 0 - 44 U/L   Alkaline Phosphatase 48 38 - 126 U/L   Total Bilirubin 2.0 (H) 0.3 - 1.2 mg/dL   GFR calc non Af Amer 46 (L) >60 mL/min   GFR calc Af Amer 53 (L) >60 mL/min   Anion gap 9 5 - 15    Comment: Performed at [facility] [address], [place], [place] [address]  CBC     Status: Abnormal   Collection Time: 07/13/18  2:16 AM  Result Value Ref Range   WBC 11.7 (H) 4.0 - 10.5 K/uL   RBC 4.90 4.22 - 5.81 MIL/uL   Hemoglobin 14.5 13.0 - 17.0 g/dL   HCT 45.1 39.0 - 52.0 %   MCV 92.0 80.0 - 100.0 fL   MCH 29.6 26.0 - 34.0 pg   MCHC 32.2 30.0 - 36.0 g/dL   RDW 13.6 11.5 - 15.5 %   Platelets 183 150 - 400 K/uL   nRBC 0.0 0.0 - 0.2 %    Comment: Performed at [facility], [place] [address] [place], [place] [address]  Troponin I - Now Then Q6H     Status: None   Collection Time: 07/13/18  2:16 AM  Result Value Ref Range   Troponin I <0.03 <0.03 ng/mL    Comment: Performed at [facility] [address] [place], [place] [address]  Lipid panel     Status: Abnormal   Collection Time: 07/13/18  2:16 AM  Result Value Ref Range     Cholesterol 140 0 - 200 mg/dL   Triglycerides 152 (H) <150 mg/dL   HDL 35 (L) >40 mg/dL   Total CHOL/HDL Ratio 4.0 RATIO   VLDL 30 0 - 40 mg/dL   LDL Cholesterol 75 0 - 99 mg/dL    Comment:        Total Cholesterol/HDL:CHD Risk Coronary Heart Disease Risk Table                     Men   Women  1/2 Average Risk   3.4   3.3  Average Risk       5.0   4.4  2 X Average Risk   9.6   7.1  3 X Average Risk  23.4   11.0  Use the calculated Patient Ratio above and the CHD Risk Table to determine the patient's CHD Risk.        ATP III CLASSIFICATION (LDL):  <100     mg/dL   Optimal  100-129  mg/dL   Near or Above                    Optimal  130-159  mg/dL   Borderline  160-189  mg/dL   High  >190     mg/dL   Very High Performed at [facility] [address] [place], [place] [address]   TSH     Status: None   Collection Time: 07/13/18  2:16 AM  Result Value Ref Range   TSH 1.915 0.350 - 4.500 uIU/mL    Comment: Performed by a 3rd Generation assay with a functional sensitivity of <=0.01 uIU/mL. Performed at [facility], [place] [address], [place], [place] [address]   Basic metabolic panel     Status: Abnormal   Collection Time: 07/13/18  9:25 AM  Result Value Ref Range   Sodium 140 135 - 145 mmol/L   Potassium 4.2 3.5 - 5.1 mmol/L   Chloride 103 98 - 111 mmol/L   CO2 31 22 - 32 mmol/L   Glucose, Bld 101 (H) 70 - 99 mg/dL   BUN 15 8 - 23 mg/dL   Creatinine, Ser 1.35 (H) 0.61 - 1.24 mg/dL   Calcium 9.5 8.9 - 10.3 mg/dL   GFR calc non Af Amer 51 (L) >60 mL/min   GFR calc Af Amer 60 (L) >60 mL/min   Anion gap 6 5 - 15    Comment: Performed at [facility], [place] [address] [place], [place] [address]  CBC     Status: Abnormal   Collection Time: 07/14/18  5:24 AM  Result Value Ref Range   WBC 11.5 (H) 4.0 - 10.5 K/uL   RBC 4.61 4.22 - 5.81 MIL/uL   Hemoglobin 13.6 13.0 - 17.0 g/dL   HCT 43.4 39.0 - 52.0 %   MCV 94.1 80.0 - 100.0 fL   MCH 29.5 26.0 -  34.0 pg   MCHC 31.3 30.0 - 36.0 g/dL   RDW 13.4 11.5 - 15.5 %   Platelets 184 150 - 400 K/uL   nRBC 0.0 0.0 - 0.2 %    Comment: Performed at [facility], [place] [address] [place], [place] [address]  Basic metabolic panel     Status: Abnormal   Collection Time: 07/14/18  5:24 AM  Result Value Ref Range   Sodium 137 135 - 145 mmol/L   Potassium 4.0 3.5 - 5.1 mmol/L   Chloride 100 98 - 111 mmol/L   CO2 29 22 - 32 mmol/L   Glucose, Bld 97 70 - 99 mg/dL   BUN 15 8 - 23 mg/dL   Creatinine, Ser 1.23 0.61 - 1.24 mg/dL   Calcium 9.5 8.9 - 10.3 mg/dL   GFR calc non Af Amer 57 (L) >60 mL/min   GFR calc Af Amer >60 >60 mL/min   Anion gap 8 5 - 15    Comment: Performed at [facility] [address] [place], [place] [address]    ECG   Sinus bradycardia with Mobitz 2, type I AVB - Personally Reviewed  Telemetry   Mobitz 2, type 1 AVB - Personally Reviewed  Radiology    Dg Chest 2 View  Result Date: 07/12/2018 CLINICAL  DATA:  Chest pain. Cough and shortness of breath for the last 2 days. EXAM: CHEST - 2 VIEW COMPARISON:  One-view chest x-ray 11/01/2017 FINDINGS: The heart is enlarged. Mild pulmonary vascular congestion is present. There are no effusions. No focal airspace disease is present. Visualized soft tissues and bony thorax are unremarkable. IMPRESSION: 1. Cardiomegaly and mild pulmonary vascular congestion without frank edema. 2. No focal airspace disease. Electronically Signed   By: San Morelle M.D.   On: 07/12/2018 12:33   Ct Angio Chest Pe W/cm &/or Wo Cm  Result Date: 07/12/2018 CLINICAL DATA:  Chest and abdominal pain with shortness of breath and dizziness. EXAM: CT ANGIOGRAPHY CHEST CT ABDOMEN AND PELVIS WITH CONTRAST TECHNIQUE: Multidetector CT imaging of the chest was performed using the standard protocol during bolus administration of intravenous contrast. Multiplanar CT image reconstructions and MIPs were obtained to evaluate the vascular anatomy.  Multidetector CT imaging of the abdomen and pelvis was performed using the standard protocol during bolus administration of intravenous contrast. CONTRAST:  139mL ISOVUE-370 IOPAMIDOL (ISOVUE-370) INJECTION 76% COMPARISON:  Chest CT 04/15/2017 FINDINGS: CTA CHEST FINDINGS Cardiovascular: The heart is upper limits of normal in size and stable. Stable mild tortuosity, ectasia and calcification of the thoracic aorta. The branch vessels are patent. Stable coronary artery calcifications and coronary artery stents. The pulmonary arterial tree is fairly well opacified. No filling defects to suggest pulmonary embolism. Mediastinum/Nodes: No mediastinal or hilar mass or adenopathy. The esophagus is unremarkable. Lungs/Pleura: Limited by breathing motion artifact but no obvious infiltrates or pulmonary lesions. Patchy basilar subsegmental atelectasis and some scarring changes. No pleural effusion. Musculoskeletal: No significant bony findings. Review of the MIP images confirms the above findings. CT ABDOMEN and PELVIS FINDINGS Hepatobiliary: No focal hepatic lesions or intrahepatic biliary dilatation. The gallbladder is surgically absent. No common bile duct dilatation. Pancreas: No mass, inflammation or ductal dilatation. Spleen: Normal size.  No focal lesions. Adrenals/Urinary Tract: The adrenal glands and kidneys are unremarkable and stable. No worrisome renal lesions or hydronephrosis. The bladder is unremarkable. Stomach/Bowel: The stomach, duodenum, small bowel and colon are unremarkable. No acute inflammatory changes, mass lesions or obstructive findings. Descending and sigmoid colon diverticulosis but no findings for acute diverticulitis. The terminal ileum is normal. The appendix is surgically absent. Vascular/Lymphatic: Advanced atherosclerotic calcifications involving the aorta and iliac arteries. No aneurysm or dissection. The branch vessels are patent. The major venous structures are patent. Small scattered  mesenteric and retroperitoneal lymph nodes but no mass or overt adenopathy. Reproductive: The prostate gland and seminal vesicles are unremarkable. Other: Borderline right-sided pelvic lymph nodes are noted. 12.5 mm lateral external iliac lymph node on image number 73. 11 mm obturator region lymph node on image number 78. 10 mm external iliac lymph node on image number 77. No inguinal lymphadenopathy. Musculoskeletal: No significant bony findings. Lumbar fusion hardware noted at L4-5. There is advanced degenerate disc disease at L5-S1. Moderate bilateral hip joint degenerative changes. Review of the MIP images confirms the above findings. IMPRESSION: 1. No CT findings for acute pulmonary embolism. 2. Reflux of contrast material down the IVC may be due to tricuspid regurgitation. 3. No thoracic aortic aneurysm or dissection. Moderate atherosclerotic calcifications. 4. No acute pulmonary findings. Streaky bibasilar atelectasis and scarring. 5. No acute abdominal/pelvic findings, mass lesions or adenopathy. 6. Borderline right-sided external iliac lymph nodes. A repeat pelvic CT scan in 4 months may be helpful to re-evaluate. 7. Colonic diverticulosis without findings for acute diverticulitis. Electronically Signed   By: Mamie Nick.  Gallerani M.D.   On: 07/12/2018 17:26   Ct Abdomen Pelvis W Contrast  Result Date: 07/12/2018 CLINICAL DATA:  Chest and abdominal pain with shortness of breath and dizziness. EXAM: CT ANGIOGRAPHY CHEST CT ABDOMEN AND PELVIS WITH CONTRAST TECHNIQUE: Multidetector CT imaging of the chest was performed using the standard protocol during bolus administration of intravenous contrast. Multiplanar CT image reconstructions and MIPs were obtained to evaluate the vascular anatomy. Multidetector CT imaging of the abdomen and pelvis was performed using the standard protocol during bolus administration of intravenous contrast. CONTRAST:  184mL ISOVUE-370 IOPAMIDOL (ISOVUE-370) INJECTION 76% COMPARISON:   Chest CT 04/15/2017 FINDINGS: CTA CHEST FINDINGS Cardiovascular: The heart is upper limits of normal in size and stable. Stable mild tortuosity, ectasia and calcification of the thoracic aorta. The branch vessels are patent. Stable coronary artery calcifications and coronary artery stents. The pulmonary arterial tree is fairly well opacified. No filling defects to suggest pulmonary embolism. Mediastinum/Nodes: No mediastinal or hilar mass or adenopathy. The esophagus is unremarkable. Lungs/Pleura: Limited by breathing motion artifact but no obvious infiltrates or pulmonary lesions. Patchy basilar subsegmental atelectasis and some scarring changes. No pleural effusion. Musculoskeletal: No significant bony findings. Review of the MIP images confirms the above findings. CT ABDOMEN and PELVIS FINDINGS Hepatobiliary: No focal hepatic lesions or intrahepatic biliary dilatation. The gallbladder is surgically absent. No common bile duct dilatation. Pancreas: No mass, inflammation or ductal dilatation. Spleen: Normal size.  No focal lesions. Adrenals/Urinary Tract: The adrenal glands and kidneys are unremarkable and stable. No worrisome renal lesions or hydronephrosis. The bladder is unremarkable. Stomach/Bowel: The stomach, duodenum, small bowel and colon are unremarkable. No acute inflammatory changes, mass lesions or obstructive findings. Descending and sigmoid colon diverticulosis but no findings for acute diverticulitis. The terminal ileum is normal. The appendix is surgically absent. Vascular/Lymphatic: Advanced atherosclerotic calcifications involving the aorta and iliac arteries. No aneurysm or dissection. The branch vessels are patent. The major venous structures are patent. Small scattered mesenteric and retroperitoneal lymph nodes but no mass or overt adenopathy. Reproductive: The prostate gland and seminal vesicles are unremarkable. Other: Borderline right-sided pelvic lymph nodes are noted. 12.5 mm lateral  external iliac lymph node on image number 73. 11 mm obturator region lymph node on image number 78. 10 mm external iliac lymph node on image number 77. No inguinal lymphadenopathy. Musculoskeletal: No significant bony findings. Lumbar fusion hardware noted at L4-5. There is advanced degenerate disc disease at L5-S1. Moderate bilateral hip joint degenerative changes. Review of the MIP images confirms the above findings. IMPRESSION: 1. No CT findings for acute pulmonary embolism. 2. Reflux of contrast material down the IVC may be due to tricuspid regurgitation. 3. No thoracic aortic aneurysm or dissection. Moderate atherosclerotic calcifications. 4. No acute pulmonary findings. Streaky bibasilar atelectasis and scarring. 5. No acute abdominal/pelvic findings, mass lesions or adenopathy. 6. Borderline right-sided external iliac lymph nodes. A repeat pelvic CT scan in 4 months may be helpful to re-evaluate. 7. Colonic diverticulosis without findings for acute diverticulitis. Electronically Signed   By: Marijo Sanes M.D.   On: 07/12/2018 17:26    Cardiac Studies   Pending  Assessment   Principal Problem:   Chest pain Active Problems:   CAD (coronary artery disease)   OSA (obstructive sleep apnea)   Heart block AV second degree   History of peptic ulcer disease   Asthma, chronic, unspecified asthma severity, with acute exacerbation   Chronic respiratory failure with hypoxia (HCC)   Abnormal EKG   Hypertension  Plan   1. No more chest pain - cath yesterday showed no targets for intervention. EP not convinced the Wenckebach is causing his symptoms and not recommending pacer at this time. Would avoid all AVN blocking medications. Given history of PUD, I'm reluctant to put him on DAPT due to concerns of bleeding. No further suggestions at this time.  [facility] will sign off.   Medication Recommendations:  Continue current meds Other recommendations (labs, testing, etc):  Avoid AVN blocking  meds Follow up as an outpatient:  3-4 weeks with Zelma Mazariego or APP at [facility]   Time Spent Directly with Patient:  I have spent a total of 25 minutes with the patient reviewing hospital notes, telemetry, EKGs, labs and examining the patient as well as establishing an assessment and plan that was discussed personally with the patient.  > 50% of time was spent in direct patient care.  Length of Stay:  LOS: 0 days   Pixie Casino, MD, [facility], [place] Director of the [facility] Lipid Disorders &  Cardiovascular Risk Reduction [facility] Diplomate of the American Board of Clinical Lipidology Attending Cardiologist  Direct Dial: [phone]  Fax: [phone]  Website:  [email or web address] Osgood [name] 07/14/2018, 9:26 AM

## 2018-07-14 NOTE — Progress Notes (Signed)
Pt went to bathroom and ra sat 64%, reapplied 02 2l/[place] sat sats 82%, up to 3l/[place] with sat 95%, pt positive for DOE, paged Dr Maylene Roes

## 2018-07-14 NOTE — Progress Notes (Signed)
avs reviewed and all questions answered, family brought portable oxygen tanki 3/4 filled, stressed importance of using oxyen due to levels dropping without it, reviewed f/u appts, pt stable at discharge, no furhter nausea, pt bm x2 and feels he is good to go " constipation is normal for me"

## 2018-07-14 NOTE — Progress Notes (Signed)
Spoke to [employer], pt reports oxygen at home, he says she has a small and big tank, informed pt he can discharge home but will need portable tank as his oxygen dropped to 60's and required 3l/[place] to get sat >92%, pt called son and he will bring the tank

## 2018-07-14 NOTE — Discharge Summary (Signed)
Physician Discharge Summary  [name] KZL:[id number] DOB: 1945-08-09 DOA: 07/12/2018  PCP: Maury Dus, MD  Admit date: 07/12/2018 Discharge date: 07/14/2018  Admitted From: Home Disposition:  Home  Recommendations for Outpatient Follow-up:  1. Follow up with PCP in 1 week 2. Follow up with cardiology with Dr. Debara Pickett in 3 to 4 weeks 3. Incidental finding needing outpatient follow-up: borderline right-sided external iliac lymph nodes. A repeat pelvic CT scan in 4 months may be helpful to re-evaluate.  Discharge Condition: Stable CODE STATUS: Full code Diet recommendation: Heart healthy  Brief/Interim Summary: From H&P by Dr. Myna Hidalgo: [name] is a 73 y.o. male with medical history significant for severe OSA, chronic dyspnea with possible asthma and OHS followed by pulmonology, coronary artery disease, hypertension, and second-degree Mobitz 1 block, now presenting to the emergency department for evaluation of shortness of breath, chest pain, and abdominal pain.  Patient reports insidiously worsening exertional dyspnea with mild nonproductive cough and no fevers or chills.  He also reports pain in the abdomen and chest that started yesterday, has been waxing and waning, without alleviating or exacerbating factors identified, and unlike his prior experience with angina.  Patient acknowledges nonadherence with his home oxygen and nocturnal BiPAP.  He is prescribed albuterol, but has not been using it.  ED Course: Upon arrival to the ED, patient is found to be afebrile, bradycardic as low as the mid 40s, slightly hypertensive, and saturating mid 90s on 4 L/min of supplemental oxygen.  EKG features a sinus rhythm with second-degree AV block Mobitz type I.  Chest x-ray is notable for cardiomegaly and mild pulmonary vascular congestion without frank edema.  CTA chest/abdomen/pelvis is negative for PE, aortic aneurysm or dissection, or acute pulmonary disease, but notable for reflux of contrast  into the IVC, possibly related to tricuspid regurgitation.  CBC is notable for mild leukocytosis and chemistry panel features a bilirubin of 2.2.  Cardiology was consulted by the ED physician and recommended medical admission.  Interim: Patient was evaluated by cardiology as well as EP.  Plan heart cath on 1/28 which revealed moderate coronary artery disease with occluded distal LCx.  No targets for PCI, medical management was recommended.  Did not feel that patient's Wenckebach was causing his symptoms, no recommendations for pacemaker at this time.  Patient should avoid all AV nodal blocking medications.  Discharge Diagnoses:  Chest pain / CAD -He presents with 1 day of chest pain, little bit different from his prior cardiac angina -EKG similar to priors, CT angiogram negative for PE, troponin has remained negative. -Cardiology following -Last cardiac cath in 2016 had a PCI to the mid RCA, he also had nonobstructive disease in the mid LAD and second marginal, treated medically.  Last echo showed normal EF in 2018. -Heart cath 1/28: Single-vessel occlusive CAD involving the distal LCx.  Recommend medical management -Continue aspirin  Essential hypertension -Continue Norvasc, HCTZ, Cozaar  Chronic kidney disease stage III -Patient's baseline creatinine around 1.2-1.5.  Remained stable on day of discharge, creatinine 1.23  Asthma / OSA -Patient follows with pulmonology for chronic dyspnea with severe OSA, possible asthma, likely OHS -He is non-compliant with nocturnal BiPAP, supplemental O2, and inhalers -He is dyspneic on admission with no acute pulmonary disease on CTA, no fever or cough -He did have URI type symptoms with runny nose over the last week but no fevers or flulike symptoms  Hyperbilirubinemia -Total bilirubin is 2.2 in ED,mainly unconjugated,previously normal -He presented with abd pain that resolved  with IV PPI and analgesia in ED, abd exam benign -On CT,  there is no focal liver lesion, no CBD or intrahepatic ductal dilatation, and gallbladder is surgically absent -LFTs are normal and bilirubin is improving.  AV block -EP consulted, recommendations for pacemaker at this time  Constipation -Bowel regimen ordered   Discharge Instructions  Discharge Instructions    Call MD for:  difficulty breathing, headache or visual disturbances   Complete by:  As directed    Call MD for:  extreme fatigue   Complete by:  As directed    Call MD for:  hives   Complete by:  As directed    Call MD for:  persistant dizziness or light-headedness   Complete by:  As directed    Call MD for:  persistant nausea and vomiting   Complete by:  As directed    Call MD for:  severe uncontrolled pain   Complete by:  As directed    Call MD for:  temperature >100.4   Complete by:  As directed    Diet - low sodium heart healthy   Complete by:  As directed    Discharge instructions   Complete by:  As directed    You were cared for by a hospitalist during your hospital stay. If you have any questions about your discharge medications or the care you received while you were in the hospital after you are discharged, you can call the unit and ask to speak with the hospitalist on call if the hospitalist that took care of you is not available. Once you are discharged, your primary care physician will handle any further medical issues. Please note that NO REFILLS for any discharge medications will be authorized once you are discharged, as it is imperative that you return to your primary care physician (or establish a relationship with a primary care physician if you do not have one) for your aftercare needs so that they can reassess your need for medications and monitor your lab values.   Increase activity slowly   Complete by:  As directed      Allergies as of 07/14/2018      Reactions   Prednisone Swelling      Medication List    TAKE these medications    acetaminophen 500 MG tablet Commonly known as:  TYLENOL Take 500 mg by mouth every 6 (six) hours as needed for moderate pain.   allopurinol 300 MG tablet Commonly known as:  ZYLOPRIM Take 300 mg by mouth Daily.   amLODipine 10 MG tablet Commonly known as:  NORVASC Take 10 mg by mouth Daily.   aspirin 81 MG EC tablet Take 1 tablet (81 mg total) by mouth daily.   bisacodyl 5 MG EC tablet Commonly known as:  DULCOLAX Take 2 tablets (10 mg total) by mouth daily.   hydrochlorothiazide 25 MG tablet Commonly known as:  HYDRODIURIL Take 25 mg by mouth daily.   losartan 100 MG tablet Commonly known as:  COZAAR Take 100 mg by mouth daily.   pantoprazole 40 MG tablet Commonly known as:  PROTONIX Take 40 mg by mouth daily.   polyethylene glycol packet Commonly known as:  MIRALAX / GLYCOLAX Take 17 g by mouth 2 (two) times daily.      Follow-up Information    Maury Dus, MD. Schedule an appointment as soon as possible for a visit today.   Specialty:  Family Medicine Contact information: [place] [address]  [phone]        Pixie Casino, MD. Schedule an appointment as soon as possible for a visit in 3 week(s).   Specialty:  Cardiology Contact information: [place] [address] [phone]          Allergies  Allergen Reactions  . Prednisone Swelling    Consultations:  Cardiology  EP   Procedures/Studies: Dg Chest 2 View  Result Date: 07/12/2018 CLINICAL DATA:  Chest pain. Cough and shortness of breath for the last 2 days. EXAM: CHEST - 2 VIEW COMPARISON:  One-view chest x-ray 11/01/2017 FINDINGS: The heart is enlarged. Mild pulmonary vascular congestion is present. There are no effusions. No focal airspace disease is present. Visualized soft tissues and bony thorax are unremarkable. IMPRESSION: 1. Cardiomegaly and mild pulmonary vascular congestion without frank edema. 2. No focal  airspace disease. Electronically Signed   By: San Morelle M.D.   On: 07/12/2018 12:33   Ct Angio Chest Pe W/cm &/or Wo Cm  Result Date: 07/12/2018 CLINICAL DATA:  Chest and abdominal pain with shortness of breath and dizziness. EXAM: CT ANGIOGRAPHY CHEST CT ABDOMEN AND PELVIS WITH CONTRAST TECHNIQUE: Multidetector CT imaging of the chest was performed using the standard protocol during bolus administration of intravenous contrast. Multiplanar CT image reconstructions and MIPs were obtained to evaluate the vascular anatomy. Multidetector CT imaging of the abdomen and pelvis was performed using the standard protocol during bolus administration of intravenous contrast. CONTRAST:  183mL ISOVUE-370 IOPAMIDOL (ISOVUE-370) INJECTION 76% COMPARISON:  Chest CT 04/15/2017 FINDINGS: CTA CHEST FINDINGS Cardiovascular: The heart is upper limits of normal in size and stable. Stable mild tortuosity, ectasia and calcification of the thoracic aorta. The branch vessels are patent. Stable coronary artery calcifications and coronary artery stents. The pulmonary arterial tree is fairly well opacified. No filling defects to suggest pulmonary embolism. Mediastinum/Nodes: No mediastinal or hilar mass or adenopathy. The esophagus is unremarkable. Lungs/Pleura: Limited by breathing motion artifact but no obvious infiltrates or pulmonary lesions. Patchy basilar subsegmental atelectasis and some scarring changes. No pleural effusion. Musculoskeletal: No significant bony findings. Review of the MIP images confirms the above findings. CT ABDOMEN and PELVIS FINDINGS Hepatobiliary: No focal hepatic lesions or intrahepatic biliary dilatation. The gallbladder is surgically absent. No common bile duct dilatation. Pancreas: No mass, inflammation or ductal dilatation. Spleen: Normal size.  No focal lesions. Adrenals/Urinary Tract: The adrenal glands and kidneys are unremarkable and stable. No worrisome renal lesions or hydronephrosis. The  bladder is unremarkable. Stomach/Bowel: The stomach, duodenum, small bowel and colon are unremarkable. No acute inflammatory changes, mass lesions or obstructive findings. Descending and sigmoid colon diverticulosis but no findings for acute diverticulitis. The terminal ileum is normal. The appendix is surgically absent. Vascular/Lymphatic: Advanced atherosclerotic calcifications involving the aorta and iliac arteries. No aneurysm or dissection. The branch vessels are patent. The major venous structures are patent. Small scattered mesenteric and retroperitoneal lymph nodes but no mass or overt adenopathy. Reproductive: The prostate gland and seminal vesicles are unremarkable. Other: Borderline right-sided pelvic lymph nodes are noted. 12.5 mm lateral external iliac lymph node on image number 73. 11 mm obturator region lymph node on image number 78. 10 mm external iliac lymph node on image number 77. No inguinal lymphadenopathy. Musculoskeletal: No significant bony findings. Lumbar fusion hardware noted at L4-5. There is advanced degenerate disc disease at L5-S1. Moderate bilateral hip joint degenerative changes. Review of the MIP images confirms the above findings. IMPRESSION: 1. No CT findings for acute pulmonary  embolism. 2. Reflux of contrast material down the IVC may be due to tricuspid regurgitation. 3. No thoracic aortic aneurysm or dissection. Moderate atherosclerotic calcifications. 4. No acute pulmonary findings. Streaky bibasilar atelectasis and scarring. 5. No acute abdominal/pelvic findings, mass lesions or adenopathy. 6. Borderline right-sided external iliac lymph nodes. A repeat pelvic CT scan in 4 months may be helpful to re-evaluate. 7. Colonic diverticulosis without findings for acute diverticulitis. Electronically Signed   By: Marijo Sanes M.D.   On: 07/12/2018 17:26   Ct Abdomen Pelvis W Contrast  Result Date: 07/12/2018 CLINICAL DATA:  Chest and abdominal pain with shortness of breath and  dizziness. EXAM: CT ANGIOGRAPHY CHEST CT ABDOMEN AND PELVIS WITH CONTRAST TECHNIQUE: Multidetector CT imaging of the chest was performed using the standard protocol during bolus administration of intravenous contrast. Multiplanar CT image reconstructions and MIPs were obtained to evaluate the vascular anatomy. Multidetector CT imaging of the abdomen and pelvis was performed using the standard protocol during bolus administration of intravenous contrast. CONTRAST:  169mL ISOVUE-370 IOPAMIDOL (ISOVUE-370) INJECTION 76% COMPARISON:  Chest CT 04/15/2017 FINDINGS: CTA CHEST FINDINGS Cardiovascular: The heart is upper limits of normal in size and stable. Stable mild tortuosity, ectasia and calcification of the thoracic aorta. The branch vessels are patent. Stable coronary artery calcifications and coronary artery stents. The pulmonary arterial tree is fairly well opacified. No filling defects to suggest pulmonary embolism. Mediastinum/Nodes: No mediastinal or hilar mass or adenopathy. The esophagus is unremarkable. Lungs/Pleura: Limited by breathing motion artifact but no obvious infiltrates or pulmonary lesions. Patchy basilar subsegmental atelectasis and some scarring changes. No pleural effusion. Musculoskeletal: No significant bony findings. Review of the MIP images confirms the above findings. CT ABDOMEN and PELVIS FINDINGS Hepatobiliary: No focal hepatic lesions or intrahepatic biliary dilatation. The gallbladder is surgically absent. No common bile duct dilatation. Pancreas: No mass, inflammation or ductal dilatation. Spleen: Normal size.  No focal lesions. Adrenals/Urinary Tract: The adrenal glands and kidneys are unremarkable and stable. No worrisome renal lesions or hydronephrosis. The bladder is unremarkable. Stomach/Bowel: The stomach, duodenum, small bowel and colon are unremarkable. No acute inflammatory changes, mass lesions or obstructive findings. Descending and sigmoid colon diverticulosis but no  findings for acute diverticulitis. The terminal ileum is normal. The appendix is surgically absent. Vascular/Lymphatic: Advanced atherosclerotic calcifications involving the aorta and iliac arteries. No aneurysm or dissection. The branch vessels are patent. The major venous structures are patent. Small scattered mesenteric and retroperitoneal lymph nodes but no mass or overt adenopathy. Reproductive: The prostate gland and seminal vesicles are unremarkable. Other: Borderline right-sided pelvic lymph nodes are noted. 12.5 mm lateral external iliac lymph node on image number 73. 11 mm obturator region lymph node on image number 78. 10 mm external iliac lymph node on image number 77. No inguinal lymphadenopathy. Musculoskeletal: No significant bony findings. Lumbar fusion hardware noted at L4-5. There is advanced degenerate disc disease at L5-S1. Moderate bilateral hip joint degenerative changes. Review of the MIP images confirms the above findings. IMPRESSION: 1. No CT findings for acute pulmonary embolism. 2. Reflux of contrast material down the IVC may be due to tricuspid regurgitation. 3. No thoracic aortic aneurysm or dissection. Moderate atherosclerotic calcifications. 4. No acute pulmonary findings. Streaky bibasilar atelectasis and scarring. 5. No acute abdominal/pelvic findings, mass lesions or adenopathy. 6. Borderline right-sided external iliac lymph nodes. A repeat pelvic CT scan in 4 months may be helpful to re-evaluate. 7. Colonic diverticulosis without findings for acute diverticulitis. Electronically Signed   By: Mamie Nick.  Gallerani M.D.   On: 07/12/2018 17:26    Heart cath 07/13/2018  Previously placed Mid RCA stent (unknown type) is widely patent.  Dist RCA lesion is 30% stenosed.  Prox LAD lesion is 35% stenosed.  Previously placed Prox Cx stent (unknown type) is widely patent.  Previously placed Mid Cx stent (unknown type) is widely patent.  Dist Cx lesion is 100% stenosed with 100% stenosed  side branch in Ost 3rd Mrg.  The left ventricular systolic function is normal.  LV end diastolic pressure is mildly elevated.  The left ventricular ejection fraction is 55-65% by visual estimate.   1. Single vessel occlusive CAD involving the distal LCx. 2. Patent stents in the LAD, RCA and LCx 3. Normal LV function 4. Mildly elevated LVEDP     Discharge Exam: Vitals:   07/14/18 0808 07/14/18 0827  BP: 126/74   Pulse:  (!) 55  Resp:  17  Temp:    SpO2:  92%    General: Pt is alert, awake, not in acute distress Cardiovascular: RRR rate 60, S1/S2 +, no rubs, no gallops Respiratory: Diminished breath sounds bilaterally, no wheezing or rhonchi Abdominal: Soft, NT, ND, bowel sounds + Extremities: Nonpitting edema, no cyanosis    The results of significant diagnostics from this hospitalization (including imaging, microbiology, ancillary and laboratory) are listed below for reference.     Microbiology: No results found for this or any previous visit (from the past 240 hour(s)).   Labs: BNP (last 3 results) No results for input(s): BNP in the last 8760 hours. Basic Metabolic Panel: Recent Labs  Lab 07/12/18 1153 07/13/18 0216 07/13/18 0925 07/14/18 0524  NA 137 138 140 137  K 3.8 4.2 4.2 4.0  CL 98 100 103 100  CO2 30 29 31 29   GLUCOSE 97 110* 101* 97  BUN 14 16 15 15   CREATININE 1.16 1.48* 1.35* 1.23  CALCIUM 10.3 9.5 9.5 9.5   Liver Function Tests: Recent Labs  Lab 07/12/18 1634 07/13/18 0216  AST 21 21  ALT 20 22  ALKPHOS 53 48  BILITOT 2.2* 2.0*  PROT 7.8 6.3*  ALBUMIN 3.7 3.1*   Recent Labs  Lab 07/12/18 1634  LIPASE 58*   No results for input(s): AMMONIA in the last 168 hours. CBC: Recent Labs  Lab 07/12/18 1153 07/13/18 0216 07/14/18 0524  WBC 12.6* 11.7* 11.5*  HGB 15.7 14.5 13.6  HCT 49.1 45.1 43.4  MCV 91.1 92.0 94.1  PLT 214 183 184   Cardiac Enzymes: Recent Labs  Lab 07/12/18 2023 07/13/18 0216  TROPONINI <0.03 <0.03    BNP: Invalid input(s): POCBNP CBG: No results for input(s): GLUCAP in the last 168 hours. D-Dimer No results for input(s): DDIMER in the last 72 hours. Hgb A1c No results for input(s): HGBA1C in the last 72 hours. Lipid Profile Recent Labs    07/13/18 0216  CHOL 140  HDL 35*  LDLCALC 75  TRIG 152*  CHOLHDL 4.0   Thyroid function studies Recent Labs    07/13/18 0216  TSH 1.915   Anemia work up No results for input(s): VITAMINB12, FOLATE, FERRITIN, TIBC, IRON, RETICCTPCT in the last 72 hours. Urinalysis    Component Value Date/Time   COLORURINE YELLOW 07/12/2018 2024   APPEARANCEUR CLEAR 07/12/2018 2024   LABSPEC >1.046 (H) 07/12/2018 2024   PHURINE 5.0 07/12/2018 2024   GLUCOSEU NEGATIVE 07/12/2018 2024   HGBUR NEGATIVE 07/12/2018 2024   BILIRUBINUR NEGATIVE 07/12/2018 2024   KETONESUR NEGATIVE 07/12/2018 2024  PROTEINUR 100 (A) 07/12/2018 2024   UROBILINOGEN 0.2 03/24/2009 2110   NITRITE NEGATIVE 07/12/2018 2024   LEUKOCYTESUR NEGATIVE 07/12/2018 2024   Sepsis Labs Invalid input(s): PROCALCITONIN,  WBC,  LACTICIDVEN Microbiology No results found for this or any previous visit (from the past 240 hour(s)).   Patient was seen and examined on the day of discharge and was found to be in stable condition. Time coordinating discharge: 40 minutes including assessment and coordination of care, as well as examination of the patient.   SIGNED:  Dessa Phi, DO [facility] [email or web address] 07/14/2018, 11:14 AM

## 2018-07-14 NOTE — Progress Notes (Signed)
RT Note:  Patient refused BIPAP. Encouraged patient to call for Respiratory if he decides to use.

## 2018-07-18 DIAGNOSIS — G4733 Obstructive sleep apnea (adult) (pediatric): Secondary | ICD-10-CM | POA: Diagnosis not present

## 2018-07-21 DIAGNOSIS — G4733 Obstructive sleep apnea (adult) (pediatric): Secondary | ICD-10-CM | POA: Diagnosis not present

## 2018-07-21 DIAGNOSIS — R079 Chest pain, unspecified: Secondary | ICD-10-CM | POA: Diagnosis not present

## 2018-07-21 DIAGNOSIS — Z6841 Body Mass Index (BMI) 40.0 and over, adult: Secondary | ICD-10-CM | POA: Diagnosis not present

## 2018-07-21 DIAGNOSIS — I4430 Unspecified atrioventricular block: Secondary | ICD-10-CM | POA: Diagnosis not present

## 2018-07-21 DIAGNOSIS — N183 Chronic kidney disease, stage 3 (moderate): Secondary | ICD-10-CM | POA: Diagnosis not present

## 2018-07-21 DIAGNOSIS — I10 Essential (primary) hypertension: Secondary | ICD-10-CM | POA: Diagnosis not present

## 2018-07-21 DIAGNOSIS — R591 Generalized enlarged lymph nodes: Secondary | ICD-10-CM | POA: Diagnosis not present

## 2018-07-21 DIAGNOSIS — J45909 Unspecified asthma, uncomplicated: Secondary | ICD-10-CM | POA: Diagnosis not present

## 2018-07-21 DIAGNOSIS — I2510 Atherosclerotic heart disease of native coronary artery without angina pectoris: Secondary | ICD-10-CM | POA: Diagnosis not present

## 2018-07-29 DIAGNOSIS — E669 Obesity, unspecified: Secondary | ICD-10-CM | POA: Diagnosis not present

## 2018-07-29 DIAGNOSIS — G4733 Obstructive sleep apnea (adult) (pediatric): Secondary | ICD-10-CM | POA: Diagnosis not present

## 2018-07-29 DIAGNOSIS — J449 Chronic obstructive pulmonary disease, unspecified: Secondary | ICD-10-CM | POA: Diagnosis not present

## 2018-07-29 DIAGNOSIS — J9690 Respiratory failure, unspecified, unspecified whether with hypoxia or hypercapnia: Secondary | ICD-10-CM | POA: Diagnosis not present

## 2018-08-04 ENCOUNTER — Ambulatory Visit: Payer: Medicare HMO | Admitting: Physician Assistant

## 2018-08-13 ENCOUNTER — Encounter: Payer: Self-pay | Admitting: Physician Assistant

## 2018-08-13 ENCOUNTER — Ambulatory Visit (INDEPENDENT_AMBULATORY_CARE_PROVIDER_SITE_OTHER): Payer: Medicare HMO | Admitting: Physician Assistant

## 2018-08-13 VITALS — BP 158/80 | HR 78 | Ht 72.0 in | Wt 316.2 lb

## 2018-08-13 DIAGNOSIS — I2510 Atherosclerotic heart disease of native coronary artery without angina pectoris: Secondary | ICD-10-CM

## 2018-08-13 DIAGNOSIS — G4733 Obstructive sleep apnea (adult) (pediatric): Secondary | ICD-10-CM | POA: Diagnosis not present

## 2018-08-13 DIAGNOSIS — M25471 Effusion, right ankle: Secondary | ICD-10-CM

## 2018-08-13 DIAGNOSIS — I441 Atrioventricular block, second degree: Secondary | ICD-10-CM

## 2018-08-13 DIAGNOSIS — I10 Essential (primary) hypertension: Secondary | ICD-10-CM

## 2018-08-13 DIAGNOSIS — M25472 Effusion, left ankle: Secondary | ICD-10-CM | POA: Diagnosis not present

## 2018-08-13 DIAGNOSIS — E785 Hyperlipidemia, unspecified: Secondary | ICD-10-CM

## 2018-08-13 NOTE — Patient Instructions (Addendum)
Medication Instructions:  Your physician recommends that you continue on your current medications as directed. Please refer to the Current Medication list given to you today.  If you need a refill on your cardiac medications before your next appointment, please call your pharmacy.   Lab work: None ordered  If you have labs (blood work) drawn today and your tests are completely normal, you will receive your results only by: Marland Kitchen MyChart Message (if you have MyChart) OR . A paper copy in the mail If you have any lab test that is abnormal or we need to change your treatment, we will call you to review the results.  Testing/Procedures: None ordered  Follow-Up: Your physician recommends that you schedule a follow-up appointment in: [address] DR. HILTY   Any Other Special Instructions Will Be Listed Below (If Applicable).

## 2018-08-13 NOTE — Progress Notes (Signed)
Cardiology Office Note    Date:  08/15/2018   ID:  [name], DOB 10-14-1945, MRN [id number]  PCP:  Maury Dus, MD  Cardiologist:  Dr. [name] / Dr. Debara Pickett  Chief Complaint  Patient presents with  . Hospitalization Follow-up    plan to establish with Dr. Debara Pickett.    History of Present Illness:  [name] is a 73 y.o. male with PMH of CAD s/p RCA stent, history of Mobitz second-degree AV block, hypertension, obstructive sleep apnea, obesity and asthma.  Last cardiac catheterization was in 2016 where he had PCI of mid RCA with bare-metal stent.  He had a mild nonobstructive disease in mid LAD, second marginal that was treated medically.  Echocardiogram in 2018 showed EF of 60 to 65% no significant valvular issue.  Patient recently presented to the hospital on 07/12/2018 with chest pain.  EKG showed bradycardia with heart rate in the upper 40s.  He underwent cardiac catheterization on 07/13/2018 which showed single-vessel CAD with 100% occluded distal left circumflex, 30% distal RCA, 35% proximal LAD, EF 55 to 65%.  Medical management was recommended.  He was seen by Dr. Rayann Heman for bradycardia and Mobitz 1 second-degree AV block, this was felt not to be the cause of his symptoms.  Therefore there was no indication for pacemaker.  He should avoid all AV nodal blocking agent in the future.  Patient presents today for cardiology office visit.  He still has occasional intermittent chest tightness, however he thought it was more acid reflux.  He is taking pantoprazole for acid reflux.  He has some degree of venous insufficiency and his lower extremity swelling become worse throughout the day.  He will continue with leg elevation and hydrochlorothiazide.  I do not think he necessarily he needs Lasix at this point.  His blood pressure is elevated today, however normally when he comes to the office, his blood pressures in the 130s.  I will hold off on adjusting his blood pressure medication due to a  single elevated blood pressure.  In the future if his blood pressure continue to be elevated, I would recommend switch to hydrochlorothiazide to chlorthalidone.   Past Medical History:  Diagnosis Date  . Arthritis    "left shoulder" (02/09/2015)  . Blind left eye   . CAD (coronary artery disease)   . Closed fracture of lateral portion of right tibial plateau 11/01/2017  . Complication of anesthesia    "they give me too much anesthesia & had to put me on life support for a little bit w/gallbladder OR"  . Coronary artery disease   . Former smoker   . GERD (gastroesophageal reflux disease)   . Gout   . History of bleeding ulcers   . History of gout   . History of peptic ulcer disease   . Hyperlipidemia   . Hypertension   . Hypertensive heart disease without CHF   . Kidney stones   . Lumbar disc disease   . Myocardial infarction [facility]) 1995; 2007  . Obesity   . OSA (obstructive sleep apnea)   . Pneumonia X 2  . Prostate cancer ([place])   . Pulmonary nodule   . Second degree Mobitz I AV block     Past Surgical History:  Procedure Laterality Date  . APPENDECTOMY    . APPENDECTOMY  ~ 1985  . BACK SURGERY    . CARDIAC CATHETERIZATION N/A 02/08/2015   Procedure: Left Heart Cath and Coronary Angiography;  Surgeon: Leonie Green  Ellyn Hack, MD;  Location: [place] [facility];  Service: Cardiovascular;  Laterality: N/A;  . CARDIAC CATHETERIZATION N/A 02/08/2015   Procedure: Coronary Stent Intervention;  Surgeon: Leonie Man, MD;  Location: [place] [facility];  Service: Cardiovascular;  Laterality: N/A;  . CHOLECYSTECTOMY    . CORONARY ANGIOPLASTY WITH STENT PLACEMENT  ~ 1995; 2007   "1 stent; 2 stents"  . LAPAROSCOPIC CHOLECYSTECTOMY    . LEFT HEART CATH AND CORONARY ANGIOGRAPHY N/A 07/13/2018   Procedure: LEFT HEART CATH AND CORONARY ANGIOGRAPHY;  Surgeon: Martinique, Peter M, MD;  Location: [place] [facility];  Service: Cardiovascular;  Laterality: N/A;  . LITHOTRIPSY  X 1  . LUMBAR LAMINECTOMY     . ORIF TIBIA PLATEAU Right 11/05/2017   Procedure: OPEN REDUCTION INTERNAL FIXATION (ORIF) TIBIAL PLATEAU;  Surgeon: Shona Needles, MD;  Location: [place];  Service: Orthopedics;  Laterality: Right;  . POSTERIOR LUMBAR FUSION  2003?  . PROSTATE BIOPSY  2015    Current Medications: Outpatient Medications Prior to Visit  Medication Sig Dispense Refill  . acetaminophen (TYLENOL) 500 MG tablet Take 500 mg by mouth every 6 (six) hours as needed for moderate pain.    Marland Kitchen allopurinol (ZYLOPRIM) 300 MG tablet Take 300 mg by mouth Daily.     Marland Kitchen amLODipine (NORVASC) 10 MG tablet Take 10 mg by mouth Daily.     Marland Kitchen aspirin EC 81 MG EC tablet Take 1 tablet (81 mg total) by mouth daily.    Marland Kitchen atorvastatin (LIPITOR) 20 MG tablet Take 20 mg by mouth once a week.    . bisacodyl (DULCOLAX) 5 MG EC tablet Take 10 mg by mouth daily as needed for mild constipation or moderate constipation.    . hydrochlorothiazide (HYDRODIURIL) 25 MG tablet Take 25 mg by mouth daily.    Marland Kitchen LINZESS 145 MCG CAPS capsule Take 145 mcg by mouth daily.    Marland Kitchen losartan (COZAAR) 100 MG tablet Take 100 mg by mouth daily.    . pantoprazole (PROTONIX) 40 MG tablet Take 40 mg by mouth daily.    . polyethylene glycol (MIRALAX / GLYCOLAX) packet Take 17 g by mouth as needed for mild constipation.    . bisacodyl (DULCOLAX) 5 MG EC tablet Take 2 tablets (10 mg total) by mouth daily. 30 tablet 1  . polyethylene glycol (MIRALAX / GLYCOLAX) packet Take 17 g by mouth 2 (two) times daily. 14 each 0   No facility-administered medications prior to visit.      Allergies:   Prednisone   Social History   Socioeconomic History  . Marital status: Married    Spouse name: Not on file  . Number of children: 2  . Years of education: Not on file  . Highest education level: Not on file  Occupational History  . Occupation: retired  Scientific laboratory technician  . Financial resource strain: Not on file  . Food insecurity:    Worry: Not on file    Inability: Not on file    . Transportation needs:    Medical: Not on file    Non-medical: Not on file  Tobacco Use  . Smoking status: Former Smoker    Packs/day: 3.00    Years: 35.00    Pack years: 105.00    Types: Cigarettes    Last attempt to quit: 06/17/1983    Years since quitting: 35.1  . Smokeless tobacco: Never Used  . Tobacco comment: "quit smoking cigarettes in 1985  Substance and Sexual Activity  .  Alcohol use: Yes    Alcohol/week: 0.0 standard drinks    Comment: "I drank a whole lot when I was younger; nothing since 1990"  . Drug use: No  . Sexual activity: Never  Lifestyle  . Physical activity:    Days per week: Not on file    Minutes per session: Not on file  . Stress: Not on file  Relationships  . Social connections:    Talks on phone: Not on file    Gets together: Not on file    Attends religious service: Not on file    Active member of club or organization: Not on file    Attends meetings of clubs or organizations: Not on file    Relationship status: Not on file  Other Topics Concern  . Not on file  Social History Narrative   ** Merged History Encounter **         Family History:  The patient's family history includes Aneurysm in his father; COPD in his sister; Coronary artery disease in his mother; Hypertension in his father and mother; Sleep apnea in an other family member.   ROS:   Please see the history of present illness.    ROS All other systems reviewed and are negative.   PHYSICAL EXAM:   VS:  BP (!) 158/80   Pulse 78   Ht 6' (1.829 m)   Wt (!) 316 lb 3.2 oz (143.4 kg)   BMI 42.88 kg/m    GEN: Well nourished, well developed, in no acute distress  HEENT: normal  Neck: no JVD, carotid bruits, or masses Cardiac: RRR; no murmurs, rubs, or gallops. 2+ ankle edema  Respiratory:  clear to auscultation bilaterally, normal work of breathing GI: soft, nontender, nondistended, + BS MS: no deformity or atrophy  Skin: warm and dry, no rash Neuro:  Alert and Oriented x 3,  Strength and sensation are intact Psych: euthymic mood, full affect  Wt Readings from Last 3 Encounters:  08/13/18 (!) 316 lb 3.2 oz (143.4 kg)  07/14/18 (!) 314 lb 9.6 oz (142.7 kg)  04/19/18 (!) 318 lb (144.2 kg)      Studies/Labs Reviewed:   EKG:  EKG is not ordered today.    Recent Labs: 07/13/2018: ALT 22; TSH 1.915 07/14/2018: BUN 15; Creatinine, Ser 1.23; Hemoglobin 13.6; Platelets 184; Potassium 4.0; Sodium 137   Lipid Panel    Component Value Date/Time   CHOL 140 07/13/2018 0216   TRIG 152 (H) 07/13/2018 0216   HDL 35 (L) 07/13/2018 0216   CHOLHDL 4.0 07/13/2018 0216   VLDL 30 07/13/2018 0216   [place] 75 07/13/2018 0216    Additional studies/ records that were reviewed today include:   Cath 07/13/2018  Previously placed Mid RCA stent (unknown type) is widely patent.  Dist RCA lesion is 30% stenosed.  Prox LAD lesion is 35% stenosed.  Previously placed Prox Cx stent (unknown type) is widely patent.  Previously placed Mid Cx stent (unknown type) is widely patent.  Dist Cx lesion is 100% stenosed with 100% stenosed side branch in Ost 3rd Mrg.  The left ventricular systolic function is normal.  LV end diastolic pressure is mildly elevated.  The left ventricular ejection fraction is 55-65% by visual estimate.   1. Single vessel occlusive CAD involving the distal LCx. 2. Patent stents in the LAD, RCA and LCx 3. Normal LV function 4. Mildly elevated LVEDP  Plan: medical management.     ASSESSMENT:    1. Coronary artery  disease involving native coronary artery of native heart without angina pectoris   2. Mobitz type 1 second degree atrioventricular block   3. Hyperlipidemia LDL goal <70   4. Essential hypertension   5. OSA (obstructive sleep apnea)   6. Ankle edema, bilateral      PLAN:  In order of problems listed above:  1. CAD: Stable anatomy on recent cardiac catheterization.  Continue aspirin and statin  2. Mobitz 1 second-degree heart  block:  Recently seen by Dr. Rayann Heman in the hospital.  He was felt to be asymptomatic from this.  Avoid AV nodal blocking agent.  3. Hypertension: Blood pressure stable  4. Hyperlipidemia: On Lipitor 20 mg once weekly.  Apparently when he take Lipitor more often, he has very bad leg cramps.  Recent lipid panel showed borderline elevated LDL, triglyceride and low HDL.  I recommended diet and activity  5. Bilateral ankle edema: More related to venous insufficiency.  On hydrochlorothiazide.  I do not think he necessarily need Lasix at this point.  He will proceed with conservative management with leg elevation.  6. Obstructive sleep apnea: Weight loss important    Medication Adjustments/Labs and Tests Ordered: Current medicines are reviewed at length with the patient today.  Concerns regarding medicines are outlined above.  Medication changes, Labs and Tests ordered today are listed in the Patient Instructions below. Patient Instructions  Medication Instructions:  Your physician recommends that you continue on your current medications as directed. Please refer to the Current Medication list given to you today.  If you need a refill on your cardiac medications before your next appointment, please call your pharmacy.   Lab work: None ordered  If you have labs (blood work) drawn today and your tests are completely normal, you will receive your results only by: Marland Kitchen MyChart Message (if you have MyChart) OR . A paper copy in the mail If you have any lab test that is abnormal or we need to change your treatment, we will call you to review the results.  Testing/Procedures: None ordered  Follow-Up: Your physician recommends that you schedule a follow-up appointment in: [place] DR. HILTY   Any Other Special Instructions Will Be Listed Below (If Applicable).       Hilbert Corrigan, [place]  08/15/2018 10:21 PM    [facility] [place], [place], [place]   [address] Phone: [phone]; Fax: [phone]

## 2018-08-16 DIAGNOSIS — G4733 Obstructive sleep apnea (adult) (pediatric): Secondary | ICD-10-CM | POA: Diagnosis not present

## 2018-08-25 DIAGNOSIS — G4733 Obstructive sleep apnea (adult) (pediatric): Secondary | ICD-10-CM | POA: Diagnosis not present

## 2018-08-27 DIAGNOSIS — E669 Obesity, unspecified: Secondary | ICD-10-CM | POA: Diagnosis not present

## 2018-08-27 DIAGNOSIS — J9690 Respiratory failure, unspecified, unspecified whether with hypoxia or hypercapnia: Secondary | ICD-10-CM | POA: Diagnosis not present

## 2018-08-27 DIAGNOSIS — J449 Chronic obstructive pulmonary disease, unspecified: Secondary | ICD-10-CM | POA: Diagnosis not present

## 2018-08-27 DIAGNOSIS — G4733 Obstructive sleep apnea (adult) (pediatric): Secondary | ICD-10-CM | POA: Diagnosis not present

## 2018-09-16 DIAGNOSIS — G4733 Obstructive sleep apnea (adult) (pediatric): Secondary | ICD-10-CM | POA: Diagnosis not present

## 2018-09-27 DIAGNOSIS — E669 Obesity, unspecified: Secondary | ICD-10-CM | POA: Diagnosis not present

## 2018-09-27 DIAGNOSIS — G4733 Obstructive sleep apnea (adult) (pediatric): Secondary | ICD-10-CM | POA: Diagnosis not present

## 2018-09-27 DIAGNOSIS — J449 Chronic obstructive pulmonary disease, unspecified: Secondary | ICD-10-CM | POA: Diagnosis not present

## 2018-09-27 DIAGNOSIS — J9690 Respiratory failure, unspecified, unspecified whether with hypoxia or hypercapnia: Secondary | ICD-10-CM | POA: Diagnosis not present

## 2018-10-27 DIAGNOSIS — J9690 Respiratory failure, unspecified, unspecified whether with hypoxia or hypercapnia: Secondary | ICD-10-CM | POA: Diagnosis not present

## 2018-10-27 DIAGNOSIS — G4733 Obstructive sleep apnea (adult) (pediatric): Secondary | ICD-10-CM | POA: Diagnosis not present

## 2018-10-27 DIAGNOSIS — E669 Obesity, unspecified: Secondary | ICD-10-CM | POA: Diagnosis not present

## 2018-10-27 DIAGNOSIS — J449 Chronic obstructive pulmonary disease, unspecified: Secondary | ICD-10-CM | POA: Diagnosis not present

## 2018-11-10 ENCOUNTER — Telehealth: Payer: Self-pay | Admitting: Internal Medicine

## 2018-11-10 NOTE — Telephone Encounter (Signed)
Follow up ° ° °Patient is returning your call. Please call. ° ° ° °

## 2018-11-10 NOTE — Telephone Encounter (Signed)
Patient agreed to a tele-visit(has a flip phone)/ verbal consent/ my chart/ pre reg completed

## 2018-11-12 ENCOUNTER — Telehealth (INDEPENDENT_AMBULATORY_CARE_PROVIDER_SITE_OTHER): Payer: Medicare HMO | Admitting: Internal Medicine

## 2018-11-12 ENCOUNTER — Encounter: Payer: Self-pay | Admitting: Internal Medicine

## 2018-11-12 VITALS — Ht 74.0 in | Wt 312.0 lb

## 2018-11-12 DIAGNOSIS — G4733 Obstructive sleep apnea (adult) (pediatric): Secondary | ICD-10-CM

## 2018-11-12 DIAGNOSIS — E785 Hyperlipidemia, unspecified: Secondary | ICD-10-CM

## 2018-11-12 DIAGNOSIS — I441 Atrioventricular block, second degree: Secondary | ICD-10-CM | POA: Diagnosis not present

## 2018-11-12 DIAGNOSIS — J45901 Unspecified asthma with (acute) exacerbation: Secondary | ICD-10-CM | POA: Diagnosis not present

## 2018-11-12 DIAGNOSIS — I10 Essential (primary) hypertension: Secondary | ICD-10-CM | POA: Diagnosis not present

## 2018-11-12 DIAGNOSIS — Z7189 Other specified counseling: Secondary | ICD-10-CM | POA: Diagnosis not present

## 2018-11-12 DIAGNOSIS — I2510 Atherosclerotic heart disease of native coronary artery without angina pectoris: Secondary | ICD-10-CM | POA: Diagnosis not present

## 2018-11-12 NOTE — Progress Notes (Signed)
Virtual Visit via Telephone Note   This visit type was conducted due to national recommendations for restrictions regarding the COVID-19 Pandemic (e.g. social distancing) in an effort to limit this patient's exposure and mitigate transmission in our community.  Due to his co-morbid illnesses, this patient is at least at moderate risk for complications without adequate follow up.  This format is felt to be most appropriate for this patient at this time.  The patient did not have access to video technology/had technical difficulties with video requiring transitioning to audio format only (telephone).  All issues noted in this document were discussed and addressed.  No physical exam could be performed with this format.  Please refer to the patient's chart for his  consent to telehealth for [facility].   Evaluation Performed: Telephone follow-up  Date:  11/12/2018   ID:  [name], [name] 29-Jul-1945, MRN [id number]  Patient Location:  [place] [address]  Provider location:   [address], [place], [place] [address]  PCP:  Billy Dus, MD  Cardiologist:  Billy Casino, MD Electrophysiologist:  None   Chief Complaint: Shortness of breath  History of Present Illness:    [name] is a 73 y.o. male who presents via audio/video conferencing for a telehealth visit today.  [name] was seen today in telephone follow-up.  He has a history of coronary disease and in January underwent left heart cath which showed an occluded distal circumflex.  Medical therapy was recommended.  This was complicated by some Wenkebach and he was evaluated by EP but not felt to be candidate for pacing.  He says since then he has felt well.  He denies any chest pain or palpitations, has had no syncope.  Unfortunately does not have a home blood pressure cuff so that we can get any readings.  He does have shortness of breath related to chronic asthma and lung disease.  He  says he cannot wear a mask at work because he is short of breath.  Overall though he feels it stable.  The patient does not have symptoms concerning for COVID-19 infection (fever, chills, cough, or new SHORTNESS OF BREATH).    Prior CV studies:   The following studies were reviewed today:  Chart reviewed Labwork  PMHx:  Past Medical History:  Diagnosis Date  . Arthritis    "left shoulder" (02/09/2015)  . Blind left eye   . CAD (coronary artery disease)   . Closed fracture of lateral portion of right tibial plateau 11/01/2017  . Complication of anesthesia    "they give me too much anesthesia & had to put me on life support for a little bit w/gallbladder OR"  . Coronary artery disease   . Former smoker   . GERD (gastroesophageal reflux disease)   . Gout   . History of bleeding ulcers   . History of gout   . History of peptic ulcer disease   . Hyperlipidemia   . Hypertension   . Hypertensive heart disease without CHF   . Kidney stones   . Lumbar disc disease   . Myocardial infarction [facility]) 1995; 2007  . Obesity   . OSA (obstructive sleep apnea)   . Pneumonia X 2  . Prostate cancer ([place])   . Pulmonary nodule   . Second degree Mobitz I AV block     Past Surgical History:  Procedure Laterality Date  . APPENDECTOMY    . APPENDECTOMY  ~  1985  . BACK SURGERY    . CARDIAC CATHETERIZATION N/A 02/08/2015   Procedure: Left Heart Cath and Coronary Angiography;  Surgeon: Leonie Man, MD;  Location: [place] [facility];  Service: Cardiovascular;  Laterality: N/A;  . CARDIAC CATHETERIZATION N/A 02/08/2015   Procedure: Coronary Stent Intervention;  Surgeon: Leonie Man, MD;  Location: [place] [facility];  Service: Cardiovascular;  Laterality: N/A;  . CHOLECYSTECTOMY    . CORONARY ANGIOPLASTY WITH STENT PLACEMENT  ~ 1995; 2007   "1 stent; 2 stents"  . LAPAROSCOPIC CHOLECYSTECTOMY    . LEFT HEART CATH AND CORONARY ANGIOGRAPHY N/A 07/13/2018   Procedure: LEFT HEART CATH AND  CORONARY ANGIOGRAPHY;  Surgeon: Martinique, Peter M, MD;  Location: [place] [facility];  Service: Cardiovascular;  Laterality: N/A;  . LITHOTRIPSY  X 1  . LUMBAR LAMINECTOMY    . ORIF TIBIA PLATEAU Right 11/05/2017   Procedure: OPEN REDUCTION INTERNAL FIXATION (ORIF) TIBIAL PLATEAU;  Surgeon: Billy Needles, MD;  Location: [place];  Service: Orthopedics;  Laterality: Right;  . POSTERIOR LUMBAR FUSION  2003?  . PROSTATE BIOPSY  2015    FAMHx:  Family History  Problem Relation Age of Onset  . Hypertension Mother   . Coronary artery disease Mother   . Aneurysm Father   . Hypertension Father   . COPD Sister   . Sleep apnea Other        3 siblings    SOCHx:   reports that he quit smoking about 35 years ago. His smoking use included cigarettes. He has a 105.00 pack-year smoking history. He has never used smokeless tobacco. He reports current alcohol use. He reports that he does not use drugs.  ALLERGIES:  Allergies  Allergen Reactions  . Prednisone Swelling    MEDS:  Current Meds  Medication Sig  . acetaminophen (TYLENOL) 500 MG tablet Take 500 mg by mouth every 6 (six) hours as needed for moderate pain.  Marland Kitchen allopurinol (ZYLOPRIM) 300 MG tablet Take 300 mg by mouth Daily.   Marland Kitchen amLODipine (NORVASC) 10 MG tablet Take 10 mg by mouth Daily.   Marland Kitchen aspirin EC 81 MG EC tablet Take 1 tablet (81 mg total) by mouth daily.  Marland Kitchen atorvastatin (LIPITOR) 20 MG tablet Take 20 mg by mouth once a week.  . bisacodyl (DULCOLAX) 5 MG EC tablet Take 10 mg by mouth daily as needed for mild constipation or moderate constipation.  . hydrochlorothiazide (HYDRODIURIL) 25 MG tablet Take 25 mg by mouth daily.  Marland Kitchen losartan (COZAAR) 100 MG tablet Take 100 mg by mouth daily.  . pantoprazole (PROTONIX) 40 MG tablet Take 40 mg by mouth daily.     ROS: Pertinent items noted in HPI and remainder of comprehensive ROS otherwise negative.  Labs/Other Tests and Data Reviewed:    Recent Labs: 07/13/2018: ALT 22; TSH 1.915  07/14/2018: BUN 15; Creatinine, Ser 1.23; Hemoglobin 13.6; Platelets 184; Potassium 4.0; Sodium 137   Recent Lipid Panel Lab Results  Component Value Date/Time   CHOL 140 07/13/2018 02:16 AM   TRIG 152 (H) 07/13/2018 02:16 AM   HDL 35 (L) 07/13/2018 02:16 AM   CHOLHDL 4.0 07/13/2018 02:16 AM   LDLCALC 75 07/13/2018 02:16 AM    Wt Readings from Last 3 Encounters:  11/12/18 (!) 312 lb (141.5 kg)  08/13/18 (!) 316 lb 3.2 oz (143.4 kg)  07/14/18 (!) 314 lb 9.6 oz (142.7 kg)     Exam:    Vital Signs:  Ht 6'  2" (1.88 Lane)   Wt (!) 312 lb (141.5 kg)   BMI 40.06 kg/Lane    Exam not performed due to telephone visit  ASSESSMENT & PLAN:    1. Coronary artery disease-occluded distal circumflex and patent stents in the LAD, RCA and circumflex (06/2018), LVEF 55 to 65% 2. Hypertension 3. Dyslipidemia 4. Asthma with chronic shortness of breath 5. Second-degree Type 1 AVB (Wenkebach)  [name] has had stable symptoms without any recurrent chest pain and no worsening of shortness of breath.  Blood pressure is unknown at this time as he does not have a home blood pressure cuff.  I encouraged him to purchase one and will need to reassess it either at his primary care office as well as in our office.  His last lipid profile was near goal LDL less than 70.  We will consider repeating that in about 6 months at which time I will see him back in the office for follow-up.  COVID-19 Education: The signs and symptoms of COVID-19 were discussed with the patient and how to seek care for testing (follow up with PCP or arrange E-visit).  The importance of social distancing was discussed today.  Patient Risk:   After full review of this patients clinical status, I feel that they are at least moderate risk at this time.  Time:   Today, I have spent 25 minutes with the patient with telehealth technology discussing hypertension, dyslipidemia, coronary disease.     Medication Adjustments/Labs and Tests  Ordered: Current medicines are reviewed at length with the patient today.  Concerns regarding medicines are outlined above.   Tests Ordered: No orders of the defined types were placed in this encounter.   Medication Changes: No orders of the defined types were placed in this encounter.   Disposition:  in 6 month(s)  Billy Casino, MD, [facility], [place] Director of the Advanced Lipid Disorders &  Cardiovascular Risk Reduction Clinic Diplomate of the American Board of Clinical Lipidology Attending Cardiologist  Direct Dial: [phone]  Fax: [phone]  Website:  [email or web address]  Billy Casino, MD  11/12/2018 9:31 AM

## 2018-11-12 NOTE — Patient Instructions (Signed)
Medication Instructions:  Continue current medications If you need a refill on your cardiac medications before your next appointment, please call your pharmacy.   Follow-Up: At [facility], you and your health needs are our priority.  As part of our continuing mission to provide you with exceptional heart care, we have created designated Provider Care Teams.  These Care Teams include your primary Cardiologist (physician) and Advanced Practice Providers (APPs -  Physician Assistants and Nurse Practitioners) who all work together to provide you with the care you need, when you need it. You will need a follow up appointment in 6 months.  Please call our office 2 months in advance to schedule this appointment.  You may see Pixie Casino, MD or one of the following Advanced Practice Providers on your designated Care Team: [place], [place] . Fabian Sharp, PA-C  Any Other Special Instructions Will Be Listed Below (If Applicable). Dr. Debara Pickett recommends a home blood pressure cuff. An ARM cuff is preferred over a wrist cuff. OMRON is a good brand.

## 2018-11-23 ENCOUNTER — Other Ambulatory Visit: Payer: Self-pay | Admitting: Family Medicine

## 2018-11-23 DIAGNOSIS — R591 Generalized enlarged lymph nodes: Secondary | ICD-10-CM

## 2018-11-23 DIAGNOSIS — R935 Abnormal findings on diagnostic imaging of other abdominal regions, including retroperitoneum: Secondary | ICD-10-CM

## 2018-11-25 DIAGNOSIS — G4733 Obstructive sleep apnea (adult) (pediatric): Secondary | ICD-10-CM | POA: Diagnosis not present

## 2018-11-27 DIAGNOSIS — E669 Obesity, unspecified: Secondary | ICD-10-CM | POA: Diagnosis not present

## 2018-11-27 DIAGNOSIS — J449 Chronic obstructive pulmonary disease, unspecified: Secondary | ICD-10-CM | POA: Diagnosis not present

## 2018-11-27 DIAGNOSIS — G4733 Obstructive sleep apnea (adult) (pediatric): Secondary | ICD-10-CM | POA: Diagnosis not present

## 2018-11-27 DIAGNOSIS — J9690 Respiratory failure, unspecified, unspecified whether with hypoxia or hypercapnia: Secondary | ICD-10-CM | POA: Diagnosis not present

## 2018-11-29 DIAGNOSIS — Z1211 Encounter for screening for malignant neoplasm of colon: Secondary | ICD-10-CM | POA: Diagnosis not present

## 2018-11-30 DIAGNOSIS — S82121D Displaced fracture of lateral condyle of right tibia, subsequent encounter for closed fracture with routine healing: Secondary | ICD-10-CM | POA: Diagnosis not present

## 2018-12-03 DIAGNOSIS — M546 Pain in thoracic spine: Secondary | ICD-10-CM | POA: Diagnosis not present

## 2018-12-22 ENCOUNTER — Other Ambulatory Visit: Payer: Medicare HMO

## 2018-12-27 ENCOUNTER — Other Ambulatory Visit: Payer: Self-pay | Admitting: Family Medicine

## 2018-12-27 DIAGNOSIS — J9690 Respiratory failure, unspecified, unspecified whether with hypoxia or hypercapnia: Secondary | ICD-10-CM | POA: Diagnosis not present

## 2018-12-27 DIAGNOSIS — J449 Chronic obstructive pulmonary disease, unspecified: Secondary | ICD-10-CM | POA: Diagnosis not present

## 2018-12-27 DIAGNOSIS — E669 Obesity, unspecified: Secondary | ICD-10-CM | POA: Diagnosis not present

## 2018-12-27 DIAGNOSIS — R935 Abnormal findings on diagnostic imaging of other abdominal regions, including retroperitoneum: Secondary | ICD-10-CM

## 2018-12-27 DIAGNOSIS — G4733 Obstructive sleep apnea (adult) (pediatric): Secondary | ICD-10-CM | POA: Diagnosis not present

## 2018-12-27 DIAGNOSIS — R591 Generalized enlarged lymph nodes: Secondary | ICD-10-CM

## 2018-12-29 ENCOUNTER — Other Ambulatory Visit: Payer: Self-pay

## 2018-12-29 ENCOUNTER — Ambulatory Visit
Admission: RE | Admit: 2018-12-29 | Discharge: 2018-12-29 | Disposition: A | Discharge status: Home / Self Care | Payer: Medicare HMO | Source: Ambulatory Visit | Attending: Family Medicine | Admitting: Family Medicine

## 2018-12-29 DIAGNOSIS — R935 Abnormal findings on diagnostic imaging of other abdominal regions, including retroperitoneum: Secondary | ICD-10-CM

## 2018-12-29 DIAGNOSIS — R590 Localized enlarged lymph nodes: Secondary | ICD-10-CM | POA: Diagnosis not present

## 2018-12-29 DIAGNOSIS — R591 Generalized enlarged lymph nodes: Secondary | ICD-10-CM

## 2018-12-29 MED ORDER — IOPAMIDOL (ISOVUE-300) INJECTION 61%
125.0000 mL | Freq: Once | INTRAVENOUS | Status: AC | PRN
  Administered 2018-12-29: 125 mL via INTRAVENOUS

## 2019-01-27 DIAGNOSIS — J449 Chronic obstructive pulmonary disease, unspecified: Secondary | ICD-10-CM | POA: Diagnosis not present

## 2019-01-27 DIAGNOSIS — G4733 Obstructive sleep apnea (adult) (pediatric): Secondary | ICD-10-CM | POA: Diagnosis not present

## 2019-01-27 DIAGNOSIS — J9690 Respiratory failure, unspecified, unspecified whether with hypoxia or hypercapnia: Secondary | ICD-10-CM | POA: Diagnosis not present

## 2019-01-27 DIAGNOSIS — E669 Obesity, unspecified: Secondary | ICD-10-CM | POA: Diagnosis not present

## 2019-02-25 DIAGNOSIS — G4733 Obstructive sleep apnea (adult) (pediatric): Secondary | ICD-10-CM | POA: Diagnosis not present

## 2019-04-19 ENCOUNTER — Emergency Department (HOSPITAL_COMMUNITY)
Admission: EM | Admit: 2019-04-19 | Discharge: 2019-04-20 | Disposition: A | Discharge status: Home / Self Care | Payer: Medicare HMO | Attending: Emergency Medicine | Admitting: Emergency Medicine

## 2019-04-19 ENCOUNTER — Other Ambulatory Visit: Payer: Self-pay

## 2019-04-19 ENCOUNTER — Encounter (HOSPITAL_COMMUNITY): Payer: Self-pay | Admitting: Emergency Medicine

## 2019-04-19 ENCOUNTER — Emergency Department (HOSPITAL_COMMUNITY): Payer: Medicare HMO

## 2019-04-19 DIAGNOSIS — Z7982 Long term (current) use of aspirin: Secondary | ICD-10-CM | POA: Diagnosis not present

## 2019-04-19 DIAGNOSIS — R609 Edema, unspecified: Secondary | ICD-10-CM | POA: Diagnosis not present

## 2019-04-19 DIAGNOSIS — Z87891 Personal history of nicotine dependence: Secondary | ICD-10-CM | POA: Insufficient documentation

## 2019-04-19 DIAGNOSIS — I252 Old myocardial infarction: Secondary | ICD-10-CM | POA: Insufficient documentation

## 2019-04-19 DIAGNOSIS — Z20828 Contact with and (suspected) exposure to other viral communicable diseases: Secondary | ICD-10-CM | POA: Diagnosis not present

## 2019-04-19 DIAGNOSIS — Z79899 Other long term (current) drug therapy: Secondary | ICD-10-CM | POA: Insufficient documentation

## 2019-04-19 DIAGNOSIS — I10 Essential (primary) hypertension: Secondary | ICD-10-CM | POA: Diagnosis not present

## 2019-04-19 DIAGNOSIS — I2510 Atherosclerotic heart disease of native coronary artery without angina pectoris: Secondary | ICD-10-CM | POA: Insufficient documentation

## 2019-04-19 DIAGNOSIS — R0600 Dyspnea, unspecified: Secondary | ICD-10-CM | POA: Diagnosis not present

## 2019-04-19 DIAGNOSIS — Z8546 Personal history of malignant neoplasm of prostate: Secondary | ICD-10-CM | POA: Diagnosis not present

## 2019-04-19 DIAGNOSIS — R0602 Shortness of breath: Secondary | ICD-10-CM | POA: Diagnosis not present

## 2019-04-19 DIAGNOSIS — R600 Localized edema: Secondary | ICD-10-CM | POA: Diagnosis not present

## 2019-04-19 DIAGNOSIS — R0609 Other forms of dyspnea: Secondary | ICD-10-CM

## 2019-04-19 LAB — TROPONIN I (HIGH SENSITIVITY): Troponin I (High Sensitivity): 7 ng/L (ref <18)

## 2019-04-19 LAB — CBC
HCT: 49.2 % (ref 39.0–52.0)
Hemoglobin: 15.8 g/dL (ref 13.0–17.0)
MCH: 30.1 pg (ref 26.0–34.0)
MCHC: 32.1 g/dL (ref 30.0–36.0)
MCV: 93.7 fL (ref 80.0–100.0)
Platelets: 203 10*3/uL (ref 150–400)
RBC: 5.25 MIL/uL (ref 4.22–5.81)
RDW: 13.8 % (ref 11.5–15.5)
WBC: 9 10*3/uL (ref 4.0–10.5)
nRBC: 0 % (ref 0.0–0.2)

## 2019-04-19 LAB — BRAIN NATRIURETIC PEPTIDE: B Natriuretic Peptide: 78 pg/mL (ref 0.0–100.0)

## 2019-04-19 LAB — SARS CORONAVIRUS 2 BY RT PCR (HOSPITAL ORDER, PERFORMED IN ~~LOC~~ HOSPITAL LAB): SARS Coronavirus 2: NEGATIVE

## 2019-04-19 LAB — BASIC METABOLIC PANEL
Anion gap: 12 (ref 5–15)
BUN: 18 mg/dL (ref 8–23)
CO2: 28 mmol/L (ref 22–32)
Calcium: 10.8 mg/dL — ABNORMAL HIGH (ref 8.9–10.3)
Chloride: 100 mmol/L (ref 98–111)
Creatinine, Ser: 1.11 mg/dL (ref 0.61–1.24)
GFR calc Af Amer: >60 mL/min (ref >60)
GFR calc non Af Amer: >60 mL/min (ref >60)
Glucose, Bld: 90 mg/dL (ref 70–99)
Potassium: 4.2 mmol/L (ref 3.5–5.1)
Sodium: 140 mmol/L (ref 135–145)

## 2019-04-19 MED ORDER — ALBUTEROL SULFATE HFA 108 (90 BASE) MCG/ACT IN AERS
2.0000 | INHALATION_SPRAY | Freq: Once | RESPIRATORY_TRACT | Status: AC
  Administered 2019-04-20: 2 via RESPIRATORY_TRACT
  Filled 2019-04-19: qty 6.7

## 2019-04-19 NOTE — ED Notes (Signed)
Pt placed on oxygen therapy via [place] at flow rate of 2 lpm

## 2019-04-19 NOTE — ED Triage Notes (Signed)
Patient c/o feeling "funny all over, like I've been taking prednisone but I haven't." Patient states he called his PCP due to the swelling on his legs and abdomen feeling distended and sent her for blood work and chest x-ray.

## 2019-04-19 NOTE — ED Provider Notes (Signed)
[facility] EMERGENCY DEPARTMENT Provider Note   CSN: [id number] Arrival date & time: 04/19/19  1125     History   Chief Complaint Chief Complaint  Patient presents with   Generalized Swelling    HPI [name] is a 73 y.o. male.     HPI   Patient is 73 year old male with history of CAD, GERD, hyperlipidemia, hypertension, MI, obesity, OSA, prostate cancer, on chronic 2 L at night, who presents the emergency department today for multiple complaints.  Patient states that he has had bilateral lower extremity swelling for the last several weeks.  He has chronic right lower extremity swelling and does have chronic dependent edema that normally resolves with elevation of his legs however recently edema has not resolved with elevation of the legs.  He also feels like his bilateral hands are swollen and tingly.  His family member at bedside states that he has been having episodes of hypoglycemia.  States that he has had blood glucose levels in the 80s and during this time he has become sweaty not felt well.  Patient reports some shortness of breath with exertion that has been worse over the last few weeks.  He denies any chest pain or pleuritic chest pain.  Denies cough or fevers.  Past Medical History:  Diagnosis Date   Arthritis    "left shoulder" (02/09/2015)   Blind left eye    CAD (coronary artery disease)    Closed fracture of lateral portion of right tibial plateau 0/94/7096   Complication of anesthesia    "they give me too much anesthesia & had to put me on life support for a little bit w/gallbladder OR"   Coronary artery disease    Former smoker    GERD (gastroesophageal reflux disease)    Gout    History of bleeding ulcers    History of gout    History of peptic ulcer disease    Hyperlipidemia    Hypertension    Hypertensive heart disease without CHF    Kidney stones    Lumbar disc disease    Myocardial infarction ([place]) 1995;  2007   Obesity    OSA (obstructive sleep apnea)    Pneumonia X 2   Prostate cancer ([place])    Pulmonary nodule    Second degree Mobitz I AV block     Patient Active Problem List   Diagnosis Date Noted   Chest pain 07/12/2018   Chronic respiratory failure with hypoxia ([place]) 07/12/2018   Abnormal EKG 07/12/2018   Hypertension 07/12/2018   Generalized abdominal pain    Closed fracture of medial plateau of right tibia    Closed fracture of lateral portion of right tibial plateau 11/01/2017   Asthma, chronic, unspecified asthma severity, with acute exacerbation 05/06/2017   History of peptic ulcer disease    Lumbar disc disease    Heart block AV second degree    Hyperlipidemia 09/21/2014   Pulmonary nodule    OSA (obstructive sleep apnea) 04/12/2009   Obesity (BMI 30-39.9)    CAD (coronary artery disease) 04/11/2009   Hypertensive heart disease without CHF    GERD     Past Surgical History:  Procedure Laterality Date   APPENDECTOMY     APPENDECTOMY  ~ [place] N/A 02/08/2015   Procedure: Left Heart Cath and Coronary Angiography;  Surgeon: Leonie Man, MD;  Location: [facility];  Service: Cardiovascular;  Laterality:  N/A;   CARDIAC CATHETERIZATION N/A 02/08/2015   Procedure: Coronary Stent Intervention;  Surgeon: Leonie Man, MD;  Location: [facility];  Service: Cardiovascular;  Laterality: N/A;   CHOLECYSTECTOMY     CORONARY ANGIOPLASTY WITH STENT PLACEMENT  ~ 1995; 2007   "1 stent; 2 stents"   LAPAROSCOPIC CHOLECYSTECTOMY     LEFT HEART CATH AND CORONARY ANGIOGRAPHY N/A 07/13/2018   Procedure: LEFT HEART CATH AND CORONARY ANGIOGRAPHY;  Surgeon: Martinique, Peter M, MD;  Location: [facility];  Service: Cardiovascular;  Laterality: N/A;   LITHOTRIPSY  X 1   LUMBAR LAMINECTOMY     ORIF TIBIA PLATEAU Right 11/05/2017   Procedure: OPEN REDUCTION INTERNAL FIXATION (ORIF) TIBIAL  PLATEAU;  Surgeon: Shona Needles, MD;  Location: [facility];  Service: Orthopedics;  Laterality: Right;   POSTERIOR LUMBAR FUSION  2003?   PROSTATE BIOPSY  2015        Home Medications    Prior to Admission medications   Medication Sig Start Date End Date Taking? Authorizing Provider  allopurinol (ZYLOPRIM) 300 MG tablet Take 300 mg by mouth Daily.  11/07/11  Yes [provider]  amLODipine (NORVASC) 10 MG tablet Take 10 mg by mouth Daily.  11/07/11  Yes [provider]  aspirin EC 81 MG EC tablet Take 1 tablet (81 mg total) by mouth daily. 02/10/15  Yes Jacolyn Reedy, MD  atorvastatin (LIPITOR) 20 MG tablet Take 20 mg by mouth every Sunday.    Yes [provider]  Calcium 600-200 MG-UNIT tablet Take 1 tablet by mouth daily.   Yes [provider]  Cholecalciferol (DIALYVITE VITAMIN D 5000) 125 MCG (5000 UT) capsule Take 5,000 Units by mouth daily.   Yes [provider]  hydrochlorothiazide (HYDRODIURIL) 25 MG tablet Take 25 mg by mouth daily. 06/07/18  Yes [provider]  losartan (COZAAR) 100 MG tablet Take 100 mg by mouth daily. 06/07/18  Yes [provider]  pantoprazole (PROTONIX) 40 MG tablet Take 40 mg by mouth daily. 06/07/18  Yes [provider]  acetaminophen (TYLENOL) 500 MG tablet Take 500 mg by mouth every 6 (six) hours as needed for moderate pain.    [provider]  furosemide (LASIX) 20 MG tablet Take 1 tablet (20 mg total) by mouth daily for 4 days. 04/20/19 04/24/19  Yafet Cline S, PA-C    Family History Family History  Problem Relation Age of Onset   Hypertension Mother    Coronary artery disease Mother    Aneurysm Father    Hypertension Father    COPD Sister    Sleep apnea Other        3 siblings    Social History Social History   Tobacco Use   Smoking status: Former Smoker    Packs/day: 3.00    Years: 35.00    Pack years: 105.00    Types: Cigarettes    Quit  date: 06/17/1983    Years since quitting: 35.8   Smokeless tobacco: Never Used   Tobacco comment: "quit smoking cigarettes in 1985  Substance Use Topics   Alcohol use: Yes    Alcohol/week: 0.0 standard drinks    Comment: "I drank a whole lot when I was younger; nothing since 1990"   Drug use: No     Allergies   Prednisone   Review of Systems Review of Systems  Constitutional: Negative for fever.  HENT: Negative for ear pain and sore throat.   Eyes: Negative  for visual disturbance.  Respiratory: Positive for shortness of breath. Negative for cough.   Cardiovascular: Positive for leg swelling. Negative for chest pain.  Gastrointestinal: Negative for abdominal pain, constipation, diarrhea, nausea and vomiting.  Genitourinary: Negative for dysuria and hematuria.  Musculoskeletal: Negative for back pain.  Skin: Negative for rash.  Neurological: Negative for dizziness, weakness, light-headedness, numbness and headaches.       Tingling to bilat fingers  All other systems reviewed and are negative.    Physical Exam Updated Vital Signs BP 129/65    Pulse (!) 102    Temp 98.4 F (36.9 C) (Oral)    Resp 19    Ht 6\' 1"  (1.854 m)    Wt 136.1 kg    SpO2 93%    BMI 39.58 kg/m   Physical Exam Vitals signs and nursing note reviewed.  Constitutional:      Appearance: He is well-developed.  HENT:     Head: Normocephalic and atraumatic.  Eyes:     Conjunctiva/sclera: Conjunctivae normal.  Neck:     Musculoskeletal: Neck supple.  Cardiovascular:     Rate and Rhythm: Normal rate and regular rhythm.     Heart sounds: Normal heart sounds. No murmur.  Pulmonary:     Effort: Pulmonary effort is normal. No respiratory distress.     Breath sounds: Normal breath sounds. No wheezing, rhonchi or rales.  Abdominal:     General: Bowel sounds are normal.     Palpations: Abdomen is soft.     Tenderness: There is no abdominal tenderness. There is no guarding or rebound.  Musculoskeletal:          General: No tenderness.     Right lower leg: Edema present.     Left lower leg: Edema present.  Skin:    General: Skin is warm and dry.  Neurological:     Mental Status: He is alert.      ED Treatments / Results  Labs (all labs ordered are listed, but only abnormal results are displayed) Labs Reviewed  BASIC METABOLIC PANEL - Abnormal; Notable for the following components:      Result Value   Calcium 10.8 (*)    All other components within normal limits  SARS CORONAVIRUS 2 BY RT PCR (HOSPITAL ORDER, [facility])  CBC  BRAIN NATRIURETIC PEPTIDE  TROPONIN I (HIGH SENSITIVITY)  TROPONIN I (HIGH SENSITIVITY)    EKG EKG Interpretation  Date/Time:  Tuesday April 19 2019 20:57:56 EST Ventricular Rate:  46 PR Interval:    QRS Duration: 95 QT Interval:  449 QTC Calculation: 393 R Axis:   51 Text Interpretation: Second degree AV block, Mobitz II Supraventricular bigeminy Repol abnrm suggests ischemia, anterolateral P wave correlation irregular, Mobitz II vs 3AVB Confirmed by Charlesetta Shanks [phone]) on 04/19/2019 11:31:21 PM   Radiology Dg Chest 2 View  Result Date: 04/19/2019 CLINICAL DATA:  Shortness of breath and tingling in both hands for 2 weeks, history hypertension, coronary artery disease, pneumonia EXAM: CHEST - 2 VIEW COMPARISON:  07/12/2018 FINDINGS: Enlargement of cardiac silhouette. Mediastinal contours and pulmonary vascularity normal. Atherosclerotic calcification aorta. Bibasilar atelectasis and chronic central peribronchial thickening. No acute infiltrate, pleural effusion or pneumothorax. Osseous structures unremarkable. IMPRESSION: Minimal enlargement of cardiac silhouette. Bronchitic changes with bibasilar atelectasis. Electronically Signed   By: Lavonia Dana M.D.   On: 04/19/2019 12:06    Procedures Procedures (including critical care time)  Medications Ordered in ED Medications  albuterol (VENTOLIN HFA) 108 (  90 Base) MCG/ACT  inhaler 2 puff (2 puffs Inhalation Given 04/20/19 0040)  dexamethasone (DECADRON) injection 10 mg (10 mg Intravenous Given 04/20/19 0041)     Initial Impression / Assessment and Plan / ED Course  I have reviewed the triage vital signs and the nursing notes.  Pertinent labs & imaging results that were available during my care of the patient were reviewed by me and considered in my medical decision making (see chart for details).     Final Clinical Impressions(s) / ED Diagnoses   Final diagnoses:  Dyspnea on exertion  Edema, unspecified type   73 y/o male presenting with ble swelling, DOE, tingling to the bilat hands.   CBC wnl Bmp nonacute BNP negative Trop negative, delta trop neg COVID negative  CXR with mild enlargement of the cardiac silhouette and bronchitic changes with bibasilar atelectasis  CONSULT with Dr. Launa Grill, cardiology fellow who reviewed EKG and tele monitoring. She feels that rhythm is consistent with Mobitz 1. Discussed outpatient f/u with cards for further eval and she is in agreement with this.   Pts dyspnea on exertion is likely multifactorial. He was given albuterol and decadron in the ED and will be sent home with albuterol due to concern for possible bronchospasm. In setting of peripheral edema, we will also give short course of lasix. Also discussed possibility of sxs being related to his bradycardia which he states is chronic. I advised that he contact his cardiologist tomorrow to schedule an appt for f/u. Advised onf specific return precautions. He voices understanding of the plan and reasons to return. All questions answered, pt stable for d.c  ED Discharge Orders         Ordered    furosemide (LASIX) 20 MG tablet  Daily     04/20/19 0007           Rodney Booze, PA-C 04/20/19 0048    Charlesetta Shanks, MD 04/29/19 1222

## 2019-04-20 LAB — TROPONIN I (HIGH SENSITIVITY): Troponin I (High Sensitivity): 7 ng/L (ref <18)

## 2019-04-20 MED ORDER — DEXAMETHASONE SODIUM PHOSPHATE 10 MG/ML IJ SOLN
10.0000 mg | Freq: Once | INTRAMUSCULAR | Status: AC
  Administered 2019-04-20: 10 mg via INTRAVENOUS
  Filled 2019-04-20: qty 1

## 2019-04-20 MED ORDER — FUROSEMIDE 20 MG PO TABS: 20.0000 mg | ORAL_TABLET | Freq: Every day | ORAL | 0 refills | Status: DC

## 2019-04-20 NOTE — Discharge Instructions (Addendum)
Take lasix as directed.   Use 2 puffs of the albuterol inhaler every 4-6 hours as needed for shortness of breath.   Call your cardiologist tomorrow to set up an appointment to be seen within the next 5-7 days.  Please return to the emergency department for any new or worsening symptoms including chest pain, worsening shortness of breath, pain with breathing, passing out.

## 2019-04-20 NOTE — ED Provider Notes (Signed)
Medical screening examination/treatment/procedure(s) were conducted as a shared visit with non-physician practitioner(s) and myself.  I personally evaluated the patient during the encounter.  EKG Interpretation  Date/Time:  Tuesday April 19 2019 20:57:56 EST Ventricular Rate:  46 PR Interval:    QRS Duration: 95 QT Interval:  449 QTC Calculation: 393 R Axis:   51 Text Interpretation: Second degree AV block, Mobitz II Supraventricular bigeminy Repol abnrm suggests ischemia, anterolateral P wave correlation irregular, Mobitz II vs 3AVB Confirmed by Charlesetta Shanks [phone]) on 04/19/2019 11:31:21 PM Patient has severe comorbid illnesses including chronic 2 L nighttime oxygen.  He has had increasing shortness of breath for several weeks.  Patient reports increasing edema of the lower extremities.  He also perceives his upper extremities to be swelling.  Patient is alert and nontoxic.  Heart regular.  Lungs grossly clear.  Abdomen soft without guarding.  Bilateral lower extremity edema present.  BNP negative, chest x-ray does not show vascular congestion.  Cardiology consulted regarding appearance of conduction delay which is determined to be consistent Mobitz type I.  Otherwise, no signs of ischemic changes.  I agree with plan of management.   Charlesetta Shanks, MD 04/29/19 [phone]

## 2019-04-20 NOTE — ED Notes (Signed)
Patient verbalizes understanding of discharge instructions. Opportunity for questioning and answers were provided. Armband removed by staff, pt discharged from ED.  

## 2019-04-25 NOTE — Progress Notes (Signed)
Cardiology Office Note   Date:  04/27/2019   ID:  [name], DOB September 23, 1945, MRN [id number]  PCP:  Maury Dus, MD  Cardiologist:  Dr.Hilty  CC: Bradycardia post ED   History of Present Illness: [name] is a 73 y.o. male who presents for ongoing assessment and management of coronary artery disease, left heart catheterization in January 2020 revealed an occluded distal circumflex, medical therapy was recommended.  Postprocedure he had complications of [place] and was evaluated by EP but not felt to be a candidate for pacing.  Other history includes GERD, hyperlipidemia, hypertension, obesity, OSA on 2 L of oxygen via nasal cannula at night, prostate cancer.  [name] was last seen by Dr. Debara Pickett on 12/13/2018 at which time he continued to have some mild shortness of breath related to chronic asthma and lung disease.  He reported that he was unable to wear a mask because of his shortness of breath and inability to breathe through this barrier.  He was seen in the ED on 04/19/2019 with complaints of bilateral lower extremity edema which have been going on for several weeks.  He has chronic right lower extremity dependent edema that normally resolves with elevation of his legs, however this had not been resolving and therefore he presented for evaluation.  He also continued to complain of shortness of breath, especially DOE which had worsened over the last few weeks.  He denied any chest pain.  EKG in the ED revealed second-degree AV block, Mobitz 2, with supraventricular bigeminy.  Heart rate away 46 bpm.  Cardiology fellow, Dr. Launa Grill did review EKG.  She felt the rhythm was consistent with Mobitz 1.  She also felt his dyspnea on exertion was multifactorial.  He was to follow-up with cardiology.Marland Kitchen  He was treated with albuterol and Decadron in the ED and sent home with albuterol due to concern for possible bronchospasm in the setting of peripheral edema he was given a short course of  Lasix.  Today he is complaining of worsening fatigue, dizziness and ongoing dyspnea. He ran out of lasix 3 days ago and is again having some LEE. He has not been restricting salt intake.  He has not taken hs medications this am yet.    Past Medical History:  Diagnosis Date   Arthritis    "left shoulder" (02/09/2015)   Blind left eye    CAD (coronary artery disease)    Closed fracture of lateral portion of right tibial plateau 7/40/8144   Complication of anesthesia    "they give me too much anesthesia & had to put me on life support for a little bit w/gallbladder OR"   Coronary artery disease    Former smoker    GERD (gastroesophageal reflux disease)    Gout    History of bleeding ulcers    History of gout    History of peptic ulcer disease    Hyperlipidemia    Hypertension    Hypertensive heart disease without CHF    Kidney stones    Lumbar disc disease    Myocardial infarction ([place]) 1995; 2007   Obesity    OSA (obstructive sleep apnea)    Pneumonia X 2   Prostate cancer ([facility])    Pulmonary nodule    Second degree Mobitz I AV block     Past Surgical History:  Procedure Laterality Date   APPENDECTOMY     APPENDECTOMY  ~ [place]  02/08/2015   Procedure: Left Heart Cath and Coronary Angiography;  Surgeon: Leonie Man, MD;  Location: [facility];  Service: Cardiovascular;  Laterality: N/A;   CARDIAC CATHETERIZATION N/A 02/08/2015   Procedure: Coronary Stent Intervention;  Surgeon: Leonie Man, MD;  Location: [facility];  Service: Cardiovascular;  Laterality: N/A;   CHOLECYSTECTOMY     CORONARY ANGIOPLASTY WITH STENT PLACEMENT  ~ 1995; 2007   "1 stent; 2 stents"   LAPAROSCOPIC CHOLECYSTECTOMY     LEFT HEART CATH AND CORONARY ANGIOGRAPHY N/A 07/13/2018   Procedure: LEFT HEART CATH AND CORONARY ANGIOGRAPHY;  Surgeon: Martinique, Peter M, MD;  Location: [facility];  Service:  Cardiovascular;  Laterality: N/A;   LITHOTRIPSY  X 1   LUMBAR LAMINECTOMY     ORIF TIBIA PLATEAU Right 11/05/2017   Procedure: OPEN REDUCTION INTERNAL FIXATION (ORIF) TIBIAL PLATEAU;  Surgeon: Shona Needles, MD;  Location: [facility];  Service: Orthopedics;  Laterality: Right;   POSTERIOR LUMBAR FUSION  2003?   PROSTATE BIOPSY  2015     Current Outpatient Medications  Medication Sig Dispense Refill   acetaminophen (TYLENOL) 500 MG tablet Take 500 mg by mouth every 6 (six) hours as needed for moderate pain.     allopurinol (ZYLOPRIM) 300 MG tablet Take 300 mg by mouth Daily.      amLODipine (NORVASC) 10 MG tablet Take 10 mg by mouth Daily.      aspirin EC 81 MG EC tablet Take 1 tablet (81 mg total) by mouth daily.     atorvastatin (LIPITOR) 20 MG tablet Take 20 mg by mouth every Sunday.      Calcium 600-200 MG-UNIT tablet Take 1 tablet by mouth daily.     Cholecalciferol (DIALYVITE VITAMIN D 5000) 125 MCG (5000 UT) capsule Take 5,000 Units by mouth daily.     furosemide (LASIX) 20 MG tablet Take 1 tablet (20 mg total) by mouth daily. 30 tablet 6   hydrochlorothiazide (HYDRODIURIL) 25 MG tablet Take 25 mg by mouth daily.     losartan (COZAAR) 100 MG tablet Take 100 mg by mouth daily.     pantoprazole (PROTONIX) 40 MG tablet Take 40 mg by mouth daily.     No current facility-administered medications for this visit.     Allergies:   Prednisone    Social History:  The patient  reports that he quit smoking about 35 years ago. His smoking use included cigarettes. He has a 105.00 pack-year smoking history. He has never used smokeless tobacco. He reports current alcohol use. He reports that he does not use drugs.   Family History:  The patient's family history includes Aneurysm in his father; COPD in his sister; Coronary artery disease in his mother; Hypertension in his father and mother; Sleep apnea in an other family member.    ROS: All other systems are reviewed and negative.  Unless otherwise mentioned in H&P    PHYSICAL EXAM: VS:  BP (!) 150/86    Pulse 61    Temp 98.2 F (36.8 C)    Ht 6\' 1"  (1.854 m)    Wt (!) 323 lb 3.2 oz (146.6 kg)    SpO2 (!) 88%    BMI 42.64 kg/m  , BMI Body mass index is 42.64 kg/m. GEN: Well nourished, well developed, in no acute distress HEENT: normal Neck: no JVD, carotid bruits, or masses Cardiac: RRR bradycardic with 1/6 systolic murmur,, rubs, or gallops,no edema  Respiratory:  Clear to  auscultation bilaterally, normal work of breathing. Some bibasilar wheezes, cleared with coughing.  GI: soft, nontender, nondistended, + BS MS: no deformity or atrophy Skin: warm and dry, no rash Neuro:  Strength and sensation are intact Psych: euthymic mood, full affect   EKG:  Sinus bradycardia with 2nd degree AVB (Mobitz II) rate of 36 bpm.  Recent Labs: 07/13/2018: ALT 22; TSH 1.915 04/19/2019: B Natriuretic Peptide 78.0; BUN 18; Creatinine, Ser 1.11; Hemoglobin 15.8; Platelets 203; Potassium 4.2; Sodium 140    Lipid Panel    Component Value Date/Time   CHOL 140 07/13/2018 0216   TRIG 152 (H) 07/13/2018 0216   HDL 35 (L) 07/13/2018 0216   CHOLHDL 4.0 07/13/2018 0216   VLDL 30 07/13/2018 0216   LDLCALC 75 07/13/2018 0216      Wt Readings from Last 3 Encounters:  04/27/19 (!) 323 lb 3.2 oz (146.6 kg)  04/19/19 300 lb (136.1 kg)  11/12/18 (!) 312 lb (141.5 kg)      Other studies Reviewed: LHC 07/13/2018  Previously placed Mid RCA stent (unknown type) is widely patent.  Dist RCA lesion is 30% stenosed.  Prox LAD lesion is 35% stenosed.  Previously placed Prox Cx stent (unknown type) is widely patent.  Previously placed Mid Cx stent (unknown type) is widely patent.  Dist Cx lesion is 100% stenosed with 100% stenosed side branch in Ost 3rd Mrg.  The left ventricular systolic function is normal.  LV end diastolic pressure is mildly elevated.  The left ventricular ejection fraction is 55-65% by visual estimate.     1. Single vessel occlusive CAD involving the distal LCx. 2. Patent stents in the LAD, RCA and LCx 3. Normal LV function 4. Mildly elevated LVEDP  ASSESSMENT AND PLAN:  1.Symptomatic bradycardia with Mobitz II heart block:  He has complaints of chest pressure and profound fatigue. He states that he cannot do very much without getting very tired. He had evidence of Mobitz I HB in ED. Due to his symptoms, I have discussed this with Dr. Debara Pickett on site as I think he may need to have PPM.  Dr. Debara Pickett would like him to be referred to EP for further recommendations concerning need to proceed or for watchful waiting. May need a loop recorder. Referral will be made.   2. Hypertension: He has not yet taken his BP medications today. He is not on any AV nodal blocking agents. I have asked him to take his medications prior to coming in to nezt appointment so that we can ascertain his BP response to medications. He does not have a BP cuff, and therefore, one is provided for him.   3. Asthma: O2 Dependent. Wants portable tank.He is to speak with his PCP about this.  4. Chronic LEE: Lasix will be refilled. He is advised on low sodium diet.   5. Preventative Medicine: He is given flu innoculatio today.    Current medicines are reviewed at length with the patient today.    Labs/ tests ordered today include: None Referral to [facility] [place]. [place] P[place], ANP, AACC   04/27/2019 8:53 AM    [facility] [place] [address] Office [phone] Fax [phone]  Notice: This dictation was prepared with Dragon dictation along with smaller phrase technology. Any transcriptional errors that result from this process are unintentional and may not be corrected upon review.

## 2019-04-27 ENCOUNTER — Other Ambulatory Visit: Payer: Self-pay

## 2019-04-27 ENCOUNTER — Ambulatory Visit (INDEPENDENT_AMBULATORY_CARE_PROVIDER_SITE_OTHER): Payer: Medicare HMO | Admitting: Adult Health

## 2019-04-27 ENCOUNTER — Encounter: Payer: Self-pay | Admitting: Adult Health

## 2019-04-27 VITALS — BP 150/86 | HR 61 | Temp 98.2°F | Ht 73.0 in | Wt 323.2 lb

## 2019-04-27 DIAGNOSIS — I10 Essential (primary) hypertension: Secondary | ICD-10-CM

## 2019-04-27 DIAGNOSIS — R001 Bradycardia, unspecified: Secondary | ICD-10-CM | POA: Diagnosis not present

## 2019-04-27 DIAGNOSIS — I2510 Atherosclerotic heart disease of native coronary artery without angina pectoris: Secondary | ICD-10-CM

## 2019-04-27 DIAGNOSIS — Z23 Encounter for immunization: Secondary | ICD-10-CM | POA: Diagnosis not present

## 2019-04-27 DIAGNOSIS — I5032 Chronic diastolic (congestive) heart failure: Secondary | ICD-10-CM

## 2019-04-27 MED ORDER — FUROSEMIDE 20 MG PO TABS: 20.0000 mg | ORAL_TABLET | Freq: Every day | ORAL | 6 refills | Status: DC

## 2019-04-27 NOTE — Patient Instructions (Signed)
Medication Instructions:  Continue current medications  *If you need a refill on your cardiac medications before your next appointment, please call your pharmacy*  Lab Work: None Ordered  Testing/Procedures: None Ordered  Follow-Up: At [facility], you and your health needs are our priority.  As part of our continuing mission to provide you with exceptional heart care, we have created designated Provider Care Teams.  These Care Teams include your primary Cardiologist (physician) and Advanced Practice Providers (APPs -  Physician Assistants and Nurse Practitioners) who all work together to provide you with the care you need, when you need it.  Your next appointment:   2 Months  The format for your next appointment:   In Person  Provider:   Raliegh Ip Mali Hilty, MD  Other Instructions You have been referred to Cardiac Electrophysiologist

## 2019-04-27 NOTE — Addendum Note (Signed)
Addended by: Vennie Homans on: 04/27/2019 10:53 AM   Modules accepted: Orders

## 2019-05-02 ENCOUNTER — Telehealth (INDEPENDENT_AMBULATORY_CARE_PROVIDER_SITE_OTHER): Payer: Medicare HMO | Admitting: Internal Medicine

## 2019-05-02 ENCOUNTER — Telehealth: Payer: Self-pay

## 2019-05-02 ENCOUNTER — Other Ambulatory Visit: Payer: Self-pay

## 2019-05-02 VITALS — Ht 73.0 in | Wt 312.0 lb

## 2019-05-02 DIAGNOSIS — G4733 Obstructive sleep apnea (adult) (pediatric): Secondary | ICD-10-CM | POA: Diagnosis not present

## 2019-05-02 DIAGNOSIS — I441 Atrioventricular block, second degree: Secondary | ICD-10-CM | POA: Diagnosis not present

## 2019-05-02 NOTE — Telephone Encounter (Signed)
Pt scheduled for micra implant on May 17, 2019 at 12:30 pm  Will get covid test on November 28  Will mail instruction letter

## 2019-05-02 NOTE — Telephone Encounter (Signed)
-----   Message from Thompson Grayer, MD sent at 05/02/2019  3:51 PM EST ----- Schedule micra pacemaker implant Alert [employer] of the procedure Ok to be the third procedure of the day.  Request anesthesia (MAC not general) for the procedure.

## 2019-05-02 NOTE — Progress Notes (Signed)
Electrophysiology TeleHealth Note  Due to national recommendations of social distancing due to [place] 19, an audio telehealth visit is felt to be most appropriate for this patient at this time.  Verbal consent was obtained by me for the telehealth visit today.  The patient does not have capability for a virtual visit.  A phone visit is therefore required today.   Date:  05/02/2019   ID:  [name], DOB 1946-06-05, MRN [id number]  Location: patient's home  Provider location:  [facility]  Evaluation Performed: Follow-up visit  PCP:  Maury Dus, MD   Electrophysiologist:  Dr Rayann Heman  Chief Complaint:  palpitations  History of Present Illness:    [name] is a 73 y.o. male who presents via telehealth conferencing today.  Since last being seen in our clinic, the patient reports doing reasonably well.  He has chronic SOB.  His exercise is limited.  He is morbidly obese.  He is not compliant with CPAP.  Edema is stable.  + occasional dizziness.  Today, he denies symptoms of palpitations, chest pain, presyncope, or syncope.  The patient is otherwise without complaint today.  The patient denies symptoms of fevers, chills, cough, or new SOB worrisome for COVID 19.  Past Medical History:  Diagnosis Date  . Arthritis    "left shoulder" (02/09/2015)  . Blind left eye   . CAD (coronary artery disease)   . Closed fracture of lateral portion of right tibial plateau 11/01/2017  . Complication of anesthesia    "they give me too much anesthesia & had to put me on life support for a little bit w/gallbladder OR"  . Coronary artery disease   . Former smoker   . GERD (gastroesophageal reflux disease)   . Gout   . History of bleeding ulcers   . History of gout   . History of peptic ulcer disease   . Hyperlipidemia   . Hypertension   . Hypertensive heart disease without CHF   . Kidney stones   . Lumbar disc disease   . Myocardial infarction [facility]) 1995; 2007  . Obesity   . OSA  (obstructive sleep apnea)   . Pneumonia X 2  . Prostate cancer ([place])   . Pulmonary nodule   . Second degree Mobitz I AV block     Past Surgical History:  Procedure Laterality Date  . APPENDECTOMY    . APPENDECTOMY  ~ 1985  . BACK SURGERY    . CARDIAC CATHETERIZATION N/A 02/08/2015   Procedure: Left Heart Cath and Coronary Angiography;  Surgeon: Leonie Man, MD;  Location: [place] [facility];  Service: Cardiovascular;  Laterality: N/A;  . CARDIAC CATHETERIZATION N/A 02/08/2015   Procedure: Coronary Stent Intervention;  Surgeon: Leonie Man, MD;  Location: [place] [facility];  Service: Cardiovascular;  Laterality: N/A;  . CHOLECYSTECTOMY    . CORONARY ANGIOPLASTY WITH STENT PLACEMENT  ~ 1995; 2007   "1 stent; 2 stents"  . LAPAROSCOPIC CHOLECYSTECTOMY    . LEFT HEART CATH AND CORONARY ANGIOGRAPHY N/A 07/13/2018   Procedure: LEFT HEART CATH AND CORONARY ANGIOGRAPHY;  Surgeon: Martinique, Peter M, MD;  Location: [place] [facility];  Service: Cardiovascular;  Laterality: N/A;  . LITHOTRIPSY  X 1  . LUMBAR LAMINECTOMY    . ORIF TIBIA PLATEAU Right 11/05/2017   Procedure: OPEN REDUCTION INTERNAL FIXATION (ORIF) TIBIAL PLATEAU;  Surgeon: Shona Needles, MD;  Location: [place];  Service: Orthopedics;  Laterality: Right;  .  POSTERIOR LUMBAR FUSION  2003?  . PROSTATE BIOPSY  2015    Current Outpatient Medications  Medication Sig Dispense Refill  . acetaminophen (TYLENOL) 500 MG tablet Take 500 mg by mouth every 6 (six) hours as needed for moderate pain.    Marland Kitchen allopurinol (ZYLOPRIM) 300 MG tablet Take 300 mg by mouth Daily.     Marland Kitchen amLODipine (NORVASC) 10 MG tablet Take 10 mg by mouth Daily.     Marland Kitchen aspirin EC 81 MG EC tablet Take 1 tablet (81 mg total) by mouth daily.    Marland Kitchen atorvastatin (LIPITOR) 20 MG tablet Take 20 mg by mouth every Sunday.     . Calcium 600-200 MG-UNIT tablet Take 1 tablet by mouth daily.    . Cholecalciferol (DIALYVITE VITAMIN D 5000) 125 MCG (5000 UT) capsule Take 5,000 Units  by mouth daily.    . furosemide (LASIX) 20 MG tablet Take 1 tablet (20 mg total) by mouth daily. 30 tablet 6  . hydrochlorothiazide (HYDRODIURIL) 25 MG tablet Take 25 mg by mouth daily.    Marland Kitchen losartan (COZAAR) 100 MG tablet Take 100 mg by mouth daily.    . pantoprazole (PROTONIX) 40 MG tablet Take 40 mg by mouth daily.     No current facility-administered medications for this visit.     Allergies:   Prednisone   Social History:  The patient  reports that he quit smoking about 35 years ago. His smoking use included cigarettes. He has a 105.00 pack-year smoking history. He has never used smokeless tobacco. He reports current alcohol use. He reports that he does not use drugs.   Family History:  The patient's family history includes Aneurysm in his father; COPD in his sister; Coronary artery disease in his mother; Hypertension in his father and mother; Sleep apnea in an other family member.   ROS:  Please see the history of present illness.   All other systems are personally reviewed and negative.    Exam:    Vital Signs:  Ht 6\' 1"  (1.854 m)   Wt (!) 312 lb (141.5 kg)   BMI 41.16 kg/m   Well sounding and appearing, alert and conversant, he has audible wheezing and SOB.  Labs/Other Tests and Data Reviewed:    Recent Labs: 07/13/2018: ALT 22; TSH 1.915 04/19/2019: B Natriuretic Peptide 78.0; BUN 18; Creatinine, Ser 1.11; Hemoglobin 15.8; Platelets 203; Potassium 4.2; Sodium 140   Wt Readings from Last 3 Encounters:  05/02/19 (!) 312 lb (141.5 kg)  04/27/19 (!) 323 lb 3.2 oz (146.6 kg)  04/19/19 300 lb (136.1 kg)     ASSESSMENT & PLAN:    1.  Second AV block The patient has symptomatic bradycardia.  No reversible causes are found.  I would therefore recommend pacemaker implantation at this time.  Risks, benefits, alternatives to pacemaker implantation were discussed in detail with the patient today.  We talked about transitional transvenous and also leadless pacing options.  The  patient would prefer leadless pacing is feasible.  The patient understands that the risks include but are not limited to bleeding, infection, pneumothorax, perforation, tamponade, vascular damage, renal failure, MI, stroke, death,  and lead dislodgement and wishes to proceed. We will therefore schedule the procedure at the next available time.    2. SOB multifactorial Likely primarily due to obesity.  He has audible wheezing on the phone today.  This will not likely improve with pacing  3. OSA Not compliant with CPAP I have encouraged compliance with therapy  4. Obesity Body mass index is 41.16 kg/m. Lifestyle modification is encouraged     Patient Risk:  after full review of this patients clinical status, I feel that they are at high risk at this time.  Today, I have spent 20 minutes with the patient with telehealth technology discussing arrhythmia management .    Army Fossa, MD  05/02/2019 3:44 PM     [place] [address] [place] [address] [phone] (office) [phone] (fax)

## 2019-05-02 NOTE — H&P (View-Only) (Signed)
Electrophysiology TeleHealth Note  Due to national recommendations of social distancing due to [place] 19, an audio telehealth visit is felt to be most appropriate for this patient at this time.  Verbal consent was obtained by me for the telehealth visit today.  The patient does not have capability for a virtual visit.  A phone visit is therefore required today.   Date:  05/02/2019   ID:  [name], DOB May 25, 1946, MRN [id number]  Location: patient's home  Provider location:  [facility]  Evaluation Performed: Follow-up visit  PCP:  Maury Dus, MD   Electrophysiologist:  Dr Rayann Heman  Chief Complaint:  palpitations  History of Present Illness:    [name] is a 73 y.o. male who presents via telehealth conferencing today.  Since last being seen in our clinic, the patient reports doing reasonably well.  He has chronic SOB.  His exercise is limited.  He is morbidly obese.  He is not compliant with CPAP.  Edema is stable.  + occasional dizziness.  Today, he denies symptoms of palpitations, chest pain, presyncope, or syncope.  The patient is otherwise without complaint today.  The patient denies symptoms of fevers, chills, cough, or new SOB worrisome for COVID 19.  Past Medical History:  Diagnosis Date  . Arthritis    "left shoulder" (02/09/2015)  . Blind left eye   . CAD (coronary artery disease)   . Closed fracture of lateral portion of right tibial plateau 11/01/2017  . Complication of anesthesia    "they give me too much anesthesia & had to put me on life support for a little bit w/gallbladder OR"  . Coronary artery disease   . Former smoker   . GERD (gastroesophageal reflux disease)   . Gout   . History of bleeding ulcers   . History of gout   . History of peptic ulcer disease   . Hyperlipidemia   . Hypertension   . Hypertensive heart disease without CHF   . Kidney stones   . Lumbar disc disease   . Myocardial infarction [facility]) 1995; 2007  . Obesity   . OSA  (obstructive sleep apnea)   . Pneumonia X 2  . Prostate cancer ([place])   . Pulmonary nodule   . Second degree Mobitz I AV block     Past Surgical History:  Procedure Laterality Date  . APPENDECTOMY    . APPENDECTOMY  ~ 1985  . BACK SURGERY    . CARDIAC CATHETERIZATION N/A 02/08/2015   Procedure: Left Heart Cath and Coronary Angiography;  Surgeon: Leonie Man, MD;  Location: [place] [facility];  Service: Cardiovascular;  Laterality: N/A;  . CARDIAC CATHETERIZATION N/A 02/08/2015   Procedure: Coronary Stent Intervention;  Surgeon: Leonie Man, MD;  Location: [place] [facility];  Service: Cardiovascular;  Laterality: N/A;  . CHOLECYSTECTOMY    . CORONARY ANGIOPLASTY WITH STENT PLACEMENT  ~ 1995; 2007   "1 stent; 2 stents"  . LAPAROSCOPIC CHOLECYSTECTOMY    . LEFT HEART CATH AND CORONARY ANGIOGRAPHY N/A 07/13/2018   Procedure: LEFT HEART CATH AND CORONARY ANGIOGRAPHY;  Surgeon: Martinique, Peter M, MD;  Location: [place] [facility];  Service: Cardiovascular;  Laterality: N/A;  . LITHOTRIPSY  X 1  . LUMBAR LAMINECTOMY    . ORIF TIBIA PLATEAU Right 11/05/2017   Procedure: OPEN REDUCTION INTERNAL FIXATION (ORIF) TIBIAL PLATEAU;  Surgeon: Shona Needles, MD;  Location: [place];  Service: Orthopedics;  Laterality: Right;  .  POSTERIOR LUMBAR FUSION  2003?  . PROSTATE BIOPSY  2015    Current Outpatient Medications  Medication Sig Dispense Refill  . acetaminophen (TYLENOL) 500 MG tablet Take 500 mg by mouth every 6 (six) hours as needed for moderate pain.    Marland Kitchen allopurinol (ZYLOPRIM) 300 MG tablet Take 300 mg by mouth Daily.     Marland Kitchen amLODipine (NORVASC) 10 MG tablet Take 10 mg by mouth Daily.     Marland Kitchen aspirin EC 81 MG EC tablet Take 1 tablet (81 mg total) by mouth daily.    Marland Kitchen atorvastatin (LIPITOR) 20 MG tablet Take 20 mg by mouth every Sunday.     . Calcium 600-200 MG-UNIT tablet Take 1 tablet by mouth daily.    . Cholecalciferol (DIALYVITE VITAMIN D 5000) 125 MCG (5000 UT) capsule Take 5,000 Units  by mouth daily.    . furosemide (LASIX) 20 MG tablet Take 1 tablet (20 mg total) by mouth daily. 30 tablet 6  . hydrochlorothiazide (HYDRODIURIL) 25 MG tablet Take 25 mg by mouth daily.    Marland Kitchen losartan (COZAAR) 100 MG tablet Take 100 mg by mouth daily.    . pantoprazole (PROTONIX) 40 MG tablet Take 40 mg by mouth daily.     No current facility-administered medications for this visit.     Allergies:   Prednisone   Social History:  The patient  reports that he quit smoking about 35 years ago. His smoking use included cigarettes. He has a 105.00 pack-year smoking history. He has never used smokeless tobacco. He reports current alcohol use. He reports that he does not use drugs.   Family History:  The patient's family history includes Aneurysm in his father; COPD in his sister; Coronary artery disease in his mother; Hypertension in his father and mother; Sleep apnea in an other family member.   ROS:  Please see the history of present illness.   All other systems are personally reviewed and negative.    Exam:    Vital Signs:  Ht 6\' 1"  (1.854 m)   Wt (!) 312 lb (141.5 kg)   BMI 41.16 kg/m   Well sounding and appearing, alert and conversant, he has audible wheezing and SOB.  Labs/Other Tests and Data Reviewed:    Recent Labs: 07/13/2018: ALT 22; TSH 1.915 04/19/2019: B Natriuretic Peptide 78.0; BUN 18; Creatinine, Ser 1.11; Hemoglobin 15.8; Platelets 203; Potassium 4.2; Sodium 140   Wt Readings from Last 3 Encounters:  05/02/19 (!) 312 lb (141.5 kg)  04/27/19 (!) 323 lb 3.2 oz (146.6 kg)  04/19/19 300 lb (136.1 kg)     ASSESSMENT & PLAN:    1.  Second AV block The patient has symptomatic bradycardia.  No reversible causes are found.  I would therefore recommend pacemaker implantation at this time.  Risks, benefits, alternatives to pacemaker implantation were discussed in detail with the patient today.  We talked about transitional transvenous and also leadless pacing options.  The  patient would prefer leadless pacing is feasible.  The patient understands that the risks include but are not limited to bleeding, infection, pneumothorax, perforation, tamponade, vascular damage, renal failure, MI, stroke, death,  and lead dislodgement and wishes to proceed. We will therefore schedule the procedure at the next available time.    2. SOB multifactorial Likely primarily due to obesity.  He has audible wheezing on the phone today.  This will not likely improve with pacing  3. OSA Not compliant with CPAP I have encouraged compliance with therapy  4. Obesity Body mass index is 41.16 kg/m. Lifestyle modification is encouraged     Patient Risk:  after full review of this patients clinical status, I feel that they are at high risk at this time.  Today, I have spent 20 minutes with the patient with telehealth technology discussing arrhythmia management .    Army Fossa, MD  05/02/2019 3:44 PM     [place] [address] [place] [address] [phone] (office) [phone] (fax)

## 2019-05-03 NOTE — Telephone Encounter (Signed)
Letter mailed

## 2019-05-14 ENCOUNTER — Other Ambulatory Visit (HOSPITAL_COMMUNITY)
Admission: RE | Admit: 2019-05-14 | Discharge: 2019-05-14 | Disposition: A | Discharge status: Home / Self Care | Payer: Medicare HMO | Source: Ambulatory Visit | Attending: Internal Medicine | Admitting: Internal Medicine

## 2019-05-14 DIAGNOSIS — Z20828 Contact with and (suspected) exposure to other viral communicable diseases: Secondary | ICD-10-CM | POA: Diagnosis not present

## 2019-05-14 DIAGNOSIS — Z01812 Encounter for preprocedural laboratory examination: Secondary | ICD-10-CM | POA: Insufficient documentation

## 2019-05-14 LAB — SARS CORONAVIRUS 2 (TAT 6-24 HRS): SARS Coronavirus 2: NEGATIVE

## 2019-05-17 ENCOUNTER — Encounter (HOSPITAL_COMMUNITY)
Admission: RE | Disposition: A | Discharge status: Home / Self Care | Payer: Self-pay | Source: Home / Self Care | Attending: Internal Medicine

## 2019-05-17 ENCOUNTER — Ambulatory Visit (HOSPITAL_COMMUNITY)
Admission: RE | Admit: 2019-05-17 | Discharge: 2019-05-18 | Disposition: A | Discharge status: Home / Self Care | Payer: Medicare HMO | Attending: Internal Medicine | Admitting: Internal Medicine

## 2019-05-17 ENCOUNTER — Other Ambulatory Visit: Payer: Self-pay

## 2019-05-17 ENCOUNTER — Ambulatory Visit (HOSPITAL_COMMUNITY): Payer: Medicare HMO | Admitting: Anesthesiology

## 2019-05-17 DIAGNOSIS — Z6841 Body Mass Index (BMI) 40.0 and over, adult: Secondary | ICD-10-CM | POA: Diagnosis not present

## 2019-05-17 DIAGNOSIS — E785 Hyperlipidemia, unspecified: Secondary | ICD-10-CM | POA: Insufficient documentation

## 2019-05-17 DIAGNOSIS — Z888 Allergy status to other drugs, medicaments and biological substances status: Secondary | ICD-10-CM | POA: Diagnosis not present

## 2019-05-17 DIAGNOSIS — Z006 Encounter for examination for normal comparison and control in clinical research program: Secondary | ICD-10-CM | POA: Insufficient documentation

## 2019-05-17 DIAGNOSIS — Z79899 Other long term (current) drug therapy: Secondary | ICD-10-CM | POA: Insufficient documentation

## 2019-05-17 DIAGNOSIS — I119 Hypertensive heart disease without heart failure: Secondary | ICD-10-CM | POA: Diagnosis not present

## 2019-05-17 DIAGNOSIS — H5462 Unqualified visual loss, left eye, normal vision right eye: Secondary | ICD-10-CM | POA: Diagnosis not present

## 2019-05-17 DIAGNOSIS — Z9119 Patient's noncompliance with other medical treatment and regimen: Secondary | ICD-10-CM | POA: Insufficient documentation

## 2019-05-17 DIAGNOSIS — Z8249 Family history of ischemic heart disease and other diseases of the circulatory system: Secondary | ICD-10-CM | POA: Insufficient documentation

## 2019-05-17 DIAGNOSIS — Z950 Presence of cardiac pacemaker: Secondary | ICD-10-CM

## 2019-05-17 DIAGNOSIS — I2510 Atherosclerotic heart disease of native coronary artery without angina pectoris: Secondary | ICD-10-CM | POA: Diagnosis not present

## 2019-05-17 DIAGNOSIS — Z87891 Personal history of nicotine dependence: Secondary | ICD-10-CM | POA: Diagnosis not present

## 2019-05-17 DIAGNOSIS — M1990 Unspecified osteoarthritis, unspecified site: Secondary | ICD-10-CM | POA: Diagnosis not present

## 2019-05-17 DIAGNOSIS — G4733 Obstructive sleep apnea (adult) (pediatric): Secondary | ICD-10-CM | POA: Diagnosis not present

## 2019-05-17 DIAGNOSIS — I441 Atrioventricular block, second degree: Secondary | ICD-10-CM | POA: Diagnosis present

## 2019-05-17 DIAGNOSIS — I252 Old myocardial infarction: Secondary | ICD-10-CM | POA: Insufficient documentation

## 2019-05-17 DIAGNOSIS — Z7982 Long term (current) use of aspirin: Secondary | ICD-10-CM | POA: Insufficient documentation

## 2019-05-17 DIAGNOSIS — R001 Bradycardia, unspecified: Secondary | ICD-10-CM | POA: Diagnosis not present

## 2019-05-17 DIAGNOSIS — K219 Gastro-esophageal reflux disease without esophagitis: Secondary | ICD-10-CM | POA: Diagnosis not present

## 2019-05-17 DIAGNOSIS — Z955 Presence of coronary angioplasty implant and graft: Secondary | ICD-10-CM | POA: Insufficient documentation

## 2019-05-17 DIAGNOSIS — M109 Gout, unspecified: Secondary | ICD-10-CM | POA: Insufficient documentation

## 2019-05-17 HISTORY — PX: PACEMAKER LEADLESS INSERTION: EP1219

## 2019-05-17 LAB — SURGICAL PCR SCREEN
MRSA, PCR: NEGATIVE
Staphylococcus aureus: NEGATIVE

## 2019-05-17 SURGERY — PACEMAKER LEADLESS INSERTION
Anesthesia: Monitor Anesthesia Care

## 2019-05-17 MED ORDER — SODIUM CHLORIDE 0.9 % IV SOLN: 250.0000 mL | INTRAVENOUS | Status: DC | PRN

## 2019-05-17 MED ORDER — LIDOCAINE HCL (CARDIAC) PF 100 MG/5ML IV SOSY
PREFILLED_SYRINGE | INTRAVENOUS | Status: DC | PRN
  Administered 2019-05-17: 100 mg via INTRAVENOUS

## 2019-05-17 MED ORDER — GLYCOPYRROLATE PF 0.2 MG/ML IJ SOSY
PREFILLED_SYRINGE | INTRAMUSCULAR | Status: DC | PRN
  Administered 2019-05-17: .2 mg via INTRAVENOUS

## 2019-05-17 MED ORDER — PROPOFOL 10 MG/ML IV BOLUS
INTRAVENOUS | Status: DC | PRN
  Administered 2019-05-17: 170 mg via INTRAVENOUS

## 2019-05-17 MED ORDER — PHENYLEPHRINE HCL-NACL 10-0.9 MG/250ML-% IV SOLN
INTRAVENOUS | Status: DC | PRN
  Administered 2019-05-17: 50 ug/min via INTRAVENOUS

## 2019-05-17 MED ORDER — LOSARTAN POTASSIUM 50 MG PO TABS
100.0000 mg | ORAL_TABLET | Freq: Every day | ORAL | Status: DC
  Administered 2019-05-18: 100 mg via ORAL
  Filled 2019-05-17: qty 2

## 2019-05-17 MED ORDER — PHENYLEPHRINE HCL (PRESSORS) 10 MG/ML IV SOLN
INTRAVENOUS | Status: DC | PRN
  Administered 2019-05-17: 120 ug via INTRAVENOUS

## 2019-05-17 MED ORDER — SODIUM CHLORIDE 0.9% FLUSH
3.0000 mL | Freq: Two times a day (BID) | INTRAVENOUS | Status: DC
  Administered 2019-05-17 – 2019-05-18 (×2): 3 mL via INTRAVENOUS

## 2019-05-17 MED ORDER — AMLODIPINE BESYLATE 10 MG PO TABS
10.0000 mg | ORAL_TABLET | Freq: Every day | ORAL | Status: DC
  Administered 2019-05-18: 10 mg via ORAL
  Filled 2019-05-17: qty 1

## 2019-05-17 MED ORDER — LIDOCAINE HCL (PF) 1 % IJ SOLN
INTRAMUSCULAR | Status: DC | PRN
  Administered 2019-05-17: 30 mL

## 2019-05-17 MED ORDER — PANTOPRAZOLE SODIUM 40 MG PO TBEC
40.0000 mg | DELAYED_RELEASE_TABLET | Freq: Every day | ORAL | Status: DC
  Administered 2019-05-17 – 2019-05-18 (×2): 40 mg via ORAL
  Filled 2019-05-17 (×2): qty 1

## 2019-05-17 MED ORDER — SODIUM CHLORIDE 0.9 % IV SOLN
INTRAVENOUS | Status: DC | PRN
  Administered 2019-05-17: 15:00:00 via INTRAVENOUS

## 2019-05-17 MED ORDER — ACETAMINOPHEN 325 MG PO TABS
650.0000 mg | ORAL_TABLET | ORAL | Status: DC | PRN
  Administered 2019-05-17 – 2019-05-18 (×2): 650 mg via ORAL
  Filled 2019-05-17 (×2): qty 2

## 2019-05-17 MED ORDER — FENTANYL CITRATE (PF) 100 MCG/2ML IJ SOLN
INTRAMUSCULAR | Status: AC
  Administered 2019-05-17 (×3): 25 ug
  Filled 2019-05-17: qty 2

## 2019-05-17 MED ORDER — IOPAMIDOL (ISOVUE-370) INJECTION 76%: INTRAVENOUS | Status: DC | PRN

## 2019-05-17 MED ORDER — HEPARIN SODIUM (PORCINE) 1000 UNIT/ML IJ SOLN
INTRAMUSCULAR | Status: DC | PRN
  Administered 2019-05-17: 5000 [IU] via INTRAVENOUS

## 2019-05-17 MED ORDER — FUROSEMIDE 20 MG PO TABS
20.0000 mg | ORAL_TABLET | Freq: Every day | ORAL | Status: DC
  Administered 2019-05-17: 20 mg via ORAL
  Filled 2019-05-17: qty 1

## 2019-05-17 MED ORDER — HEPARIN (PORCINE) IN NACL 1000-0.9 UT/500ML-% IV SOLN
INTRAVENOUS | Status: DC | PRN
  Administered 2019-05-17 (×3): 500 mL

## 2019-05-17 MED ORDER — ONDANSETRON HCL 4 MG/2ML IJ SOLN
INTRAMUSCULAR | Status: DC | PRN
  Administered 2019-05-17: 4 mg via INTRAVENOUS

## 2019-05-17 MED ORDER — DEXTROSE 5 % IV SOLN
3.0000 g | INTRAVENOUS | Status: AC
  Administered 2019-05-17: 3 mg via INTRAVENOUS
  Filled 2019-05-17 (×2): qty 3000

## 2019-05-17 MED ORDER — HYDROCHLOROTHIAZIDE 25 MG PO TABS
25.0000 mg | ORAL_TABLET | Freq: Every day | ORAL | Status: DC
  Administered 2019-05-18: 25 mg via ORAL
  Filled 2019-05-17: qty 1

## 2019-05-17 MED ORDER — ALBUTEROL SULFATE (2.5 MG/3ML) 0.083% IN NEBU: 2.5000 mg | INHALATION_SOLUTION | Freq: Four times a day (QID) | RESPIRATORY_TRACT | Status: DC | PRN

## 2019-05-17 MED ORDER — SODIUM CHLORIDE 0.9 % IV SOLN
INTRAVENOUS | Status: DC
  Administered 2019-05-17: 14:00:00 via INTRAVENOUS

## 2019-05-17 MED ORDER — ALLOPURINOL 300 MG PO TABS
300.0000 mg | ORAL_TABLET | Freq: Every day | ORAL | Status: DC
  Administered 2019-05-18: 300 mg via ORAL
  Filled 2019-05-17: qty 1

## 2019-05-17 MED ORDER — MORPHINE SULFATE (PF) 2 MG/ML IV SOLN
2.0000 mg | Freq: Once | INTRAVENOUS | Status: AC
  Administered 2019-05-17: 2 mg via INTRAVENOUS
  Filled 2019-05-17: qty 1

## 2019-05-17 MED ORDER — PROPOFOL 500 MG/50ML IV EMUL
INTRAVENOUS | Status: DC | PRN
  Administered 2019-05-17: 50 ug/kg/min via INTRAVENOUS

## 2019-05-17 MED ORDER — PROPOFOL 10 MG/ML IV BOLUS: INTRAVENOUS | Status: DC | PRN

## 2019-05-17 MED ORDER — MUPIROCIN 2 % EX OINT
TOPICAL_OINTMENT | CUTANEOUS | Status: AC
  Administered 2019-05-17: 1 via TOPICAL
  Filled 2019-05-17: qty 22

## 2019-05-17 MED ORDER — ONDANSETRON HCL 4 MG/2ML IJ SOLN
4.0000 mg | Freq: Four times a day (QID) | INTRAMUSCULAR | Status: DC | PRN
  Administered 2019-05-18 (×2): 4 mg via INTRAVENOUS
  Filled 2019-05-17 (×3): qty 2

## 2019-05-17 MED ORDER — ONDANSETRON HCL 4 MG/2ML IJ SOLN: 4.0000 mg | Freq: Once | INTRAMUSCULAR | Status: DC

## 2019-05-17 MED ORDER — MUPIROCIN 2 % EX OINT
1.0000 "application " | TOPICAL_OINTMENT | Freq: Once | CUTANEOUS | Status: AC
  Administered 2019-05-17: 14:00:00 1 via TOPICAL
  Filled 2019-05-17: qty 22

## 2019-05-17 MED ORDER — SODIUM CHLORIDE 0.9% FLUSH: 3.0000 mL | INTRAVENOUS | Status: DC | PRN

## 2019-05-17 MED ORDER — SUCCINYLCHOLINE CHLORIDE 20 MG/ML IJ SOLN
INTRAMUSCULAR | Status: DC | PRN
  Administered 2019-05-17: 160 mg via INTRAVENOUS

## 2019-05-17 MED ORDER — FENTANYL CITRATE (PF) 100 MCG/2ML IJ SOLN: 50.0000 ug | INTRAMUSCULAR | Status: DC | PRN

## 2019-05-17 MED ORDER — ONDANSETRON HCL 4 MG/2ML IJ SOLN
INTRAMUSCULAR | Status: AC
  Administered 2019-05-17: 4 mg
  Filled 2019-05-17: qty 2

## 2019-05-17 MED ORDER — LIDOCAINE HCL (CARDIAC) PF 100 MG/5ML IV SOSY: PREFILLED_SYRINGE | INTRAVENOUS | Status: DC | PRN

## 2019-05-17 MED ORDER — ROCURONIUM BROMIDE 100 MG/10ML IV SOLN
INTRAVENOUS | Status: DC | PRN
  Administered 2019-05-17: 40 mg via INTRAVENOUS
  Administered 2019-05-17: 20 mg via INTRAVENOUS

## 2019-05-17 SURGICAL SUPPLY — 12 items
DEVICE CLOSURE PERCLS PRGLD 6F (VASCULAR PRODUCTS) IMPLANT
KIT DILATOR VASC 18G NDL (KITS) ×2 IMPLANT
MICRA AV TRANSCATH PACING SYS (Pacemaker) ×3 IMPLANT
MICRA INTRODUCER SHEATH (SHEATH) ×3
PACK EP LATEX FREE (CUSTOM PROCEDURE TRAY) ×3
PACK EP LF (CUSTOM PROCEDURE TRAY) IMPLANT
PAD PRO RADIOLUCENT 2001M-C (PAD) ×3 IMPLANT
PERCLOSE PROGLIDE 6F (VASCULAR PRODUCTS) ×6
SHEATH INTRODUCER MICRA (SHEATH) IMPLANT
SHEATH PINNACLE 8F 10CM (SHEATH) ×2 IMPLANT
SYSTEM PACING TRNSCTH AV MICRA (Pacemaker) IMPLANT
WIRE AMPLATZ SS-J .035X180CM (WIRE) ×2 IMPLANT

## 2019-05-17 NOTE — Progress Notes (Signed)
Fiqure 8 stitch was removed and additional pressure manually held for 20 mins.  Site was watched for 15 mins. No bleeding noted.  Dry clean dressing applied with gauze and tegaderm.  Care instruction given to pt and nurse. Groin was a level 0 at this time. Advised to keep pt flat, or not to raise the [facility] no higher tthan 20 degrees until bedrest was up.

## 2019-05-17 NOTE — Progress Notes (Signed)
Bedrest started 1940 /  Figure 8 stop cock removed by cath lab staff and pressure held.

## 2019-05-17 NOTE — Progress Notes (Addendum)
Pt's son attempted to call for update while this RN was in with another pt. RN confirmed pt okay with conveyance of healthcare information to son and then son called back x 2 to cell phone and x 1 to home phone with no answer. Will attempt later in shift as time permits.  Addendum: @approx  2230 pt son called back and update given.

## 2019-05-17 NOTE — Interval H&P Note (Signed)
History and Physical Interval Note:  05/17/2019 3:04 PM  Abdurahman R Piatkowski  has presented today for surgery, with the diagnosis of bradicardia.  The various methods of treatment have been discussed with the patient and family. After consideration of risks, benefits and other options for treatment, the patient has consented to  Procedure(s): PACEMAKER LEADLESS INSERTION (N/A) as a surgical intervention.  The patient's history has been reviewed, patient examined, no change in status, stable for surgery.  I have reviewed the patient's chart and labs.  Questions were answered to the patient's satisfaction.     Thompson Grayer

## 2019-05-17 NOTE — Progress Notes (Signed)
Pt complained of chronic back pain exacerbated by bedrest unrelieved by APAP. @ approx. 51 MD on-call, Dr. Paticia Stack, paged. Page promptly returned and 2mg  Morphine IV x1 ordered. Will administer and continue to monitor.

## 2019-05-17 NOTE — Anesthesia Preprocedure Evaluation (Addendum)
Anesthesia Evaluation  Patient identified by MRN, date of birth, ID band Patient awake    Reviewed: Allergy & Precautions, NPO status , Patient's Chart, lab work & pertinent test results  Airway Mallampati: II  TM Distance: >3 FB Neck ROM: Full    Dental no notable dental hx.    Pulmonary shortness of breath, sleep apnea and Oxygen sleep apnea , former smoker,  Severe difficulty being flat. Sleeps in a recliner at night   Pulmonary exam normal breath sounds clear to auscultation       Cardiovascular hypertension, + CAD and + Past MI  + dysrhythmias Atrial Fibrillation  Rhythm:Irregular Rate:Normal     Neuro/Psych negative neurological ROS  negative psych ROS   GI/Hepatic negative GI ROS, Neg liver ROS,   Endo/Other  Morbid obesity  Renal/GU negative Renal ROS  negative genitourinary   Musculoskeletal negative musculoskeletal ROS (+)   Abdominal   Peds negative pediatric ROS (+)  Hematology negative hematology ROS (+)   Anesthesia Other Findings   Reproductive/Obstetrics negative OB ROS                            Anesthesia Physical Anesthesia Plan  ASA: III  Anesthesia Plan: MAC   Post-op Pain Management:    Induction: Intravenous  PONV Risk Score and Plan: 0  Airway Management Planned: Simple Face Mask  Additional Equipment:   Intra-op Plan:   Post-operative Plan:   Informed Consent: I have reviewed the patients History and Physical, chart, labs and discussed the procedure including the risks, benefits and alternatives for the proposed anesthesia with the patient or authorized representative who has indicated his/her understanding and acceptance.     Dental advisory given  Plan Discussed with: CRNA and Surgeon  Anesthesia Plan Comments: (GETA if patient unable to breathe secondary to position on procedure bed)       Anesthesia Quick Evaluation

## 2019-05-17 NOTE — Anesthesia Procedure Notes (Signed)
Procedure Name: Intubation Date/Time: 05/17/2019 4:09 PM Performed by: Eligha Bridegroom, CRNA Pre-anesthesia Checklist: Patient identified, Emergency Drugs available, Suction available, Patient being monitored and Timeout performed Patient Re-evaluated:Patient Re-evaluated prior to induction Oxygen Delivery Method: Circle system utilized Preoxygenation: Pre-oxygenation with 100% oxygen Induction Type: IV induction and Rapid sequence Laryngoscope Size: Mac and 4 Grade View: Grade I Tube type: Oral Tube size: 8.0 mm Number of attempts: 1 Airway Equipment and Method: Stylet Placement Confirmation: ETT inserted through vocal cords under direct vision,  positive ETCO2 and breath sounds checked- equal and bilateral Secured at: 23 cm Tube secured with: Tape Dental Injury: Teeth and Oropharynx as per pre-operative assessment

## 2019-05-17 NOTE — Anesthesia Postprocedure Evaluation (Signed)
Anesthesia Post Note  Patient: [name]  Procedure(s) Performed: PACEMAKER LEADLESS INSERTION (N/A )     Patient location during evaluation: PACU Anesthesia Type: MAC and General Level of consciousness: awake Pain management: pain level controlled Vital Signs Assessment: post-procedure vital signs reviewed and stable Respiratory status: spontaneous breathing Cardiovascular status: stable Postop Assessment: no apparent nausea or vomiting Anesthetic complications: no    Last Vitals:  Vitals:   05/17/19 1302 05/17/19 1759  BP: (!) 183/68 (!) 141/98  Pulse: (!) 37 71  Resp:  16  Temp: 36.8 C 36.6 C  SpO2: 95% 95%    Last Pain:  Vitals:   05/17/19 1759  PainSc: 0-No pain                 Shalee Paolo

## 2019-05-17 NOTE — Transfer of Care (Signed)
Immediate Anesthesia Transfer of Care Note  Patient: [name]  Procedure(s) Performed: PACEMAKER LEADLESS INSERTION (N/A )  Patient Location: PACU  Anesthesia Type:General  Level of Consciousness: awake, alert  and oriented  Airway & Oxygen Therapy: Patient Spontanous Breathing and Patient connected to face mask oxygen  Post-op Assessment: Report given to RN and Post -op Vital signs reviewed and stable  Post vital signs: Reviewed and stable  Last Vitals:  Vitals Value Taken Time  BP 141/98 05/17/19 1759  Temp 36.6 C 05/17/19 1759  Pulse 71 05/17/19 1759  Resp 16 05/17/19 1759  SpO2 95 % 05/17/19 1759    Last Pain:  Vitals:   05/17/19 1759  PainSc: 0-No pain         Complications: No apparent anesthesia complications

## 2019-05-18 ENCOUNTER — Ambulatory Visit (HOSPITAL_COMMUNITY): Payer: Medicare HMO

## 2019-05-18 ENCOUNTER — Encounter (HOSPITAL_COMMUNITY): Payer: Self-pay | Admitting: Internal Medicine

## 2019-05-18 DIAGNOSIS — I441 Atrioventricular block, second degree: Secondary | ICD-10-CM | POA: Diagnosis not present

## 2019-05-18 DIAGNOSIS — Z006 Encounter for examination for normal comparison and control in clinical research program: Secondary | ICD-10-CM | POA: Diagnosis not present

## 2019-05-18 DIAGNOSIS — G4733 Obstructive sleep apnea (adult) (pediatric): Secondary | ICD-10-CM | POA: Diagnosis not present

## 2019-05-18 DIAGNOSIS — R918 Other nonspecific abnormal finding of lung field: Secondary | ICD-10-CM | POA: Diagnosis not present

## 2019-05-18 DIAGNOSIS — I119 Hypertensive heart disease without heart failure: Secondary | ICD-10-CM | POA: Diagnosis not present

## 2019-05-18 DIAGNOSIS — E785 Hyperlipidemia, unspecified: Secondary | ICD-10-CM | POA: Diagnosis not present

## 2019-05-18 DIAGNOSIS — I2510 Atherosclerotic heart disease of native coronary artery without angina pectoris: Secondary | ICD-10-CM | POA: Diagnosis not present

## 2019-05-18 DIAGNOSIS — K219 Gastro-esophageal reflux disease without esophagitis: Secondary | ICD-10-CM | POA: Diagnosis not present

## 2019-05-18 DIAGNOSIS — H5462 Unqualified visual loss, left eye, normal vision right eye: Secondary | ICD-10-CM | POA: Diagnosis not present

## 2019-05-18 DIAGNOSIS — Z950 Presence of cardiac pacemaker: Secondary | ICD-10-CM | POA: Diagnosis not present

## 2019-05-18 DIAGNOSIS — Z87891 Personal history of nicotine dependence: Secondary | ICD-10-CM | POA: Diagnosis not present

## 2019-05-18 LAB — BASIC METABOLIC PANEL
Anion gap: 9 (ref 5–15)
BUN: 14 mg/dL (ref 8–23)
CO2: 29 mmol/L (ref 22–32)
Calcium: 9.7 mg/dL (ref 8.9–10.3)
Chloride: 104 mmol/L (ref 98–111)
Creatinine, Ser: 1.23 mg/dL (ref 0.61–1.24)
GFR calc Af Amer: >60 mL/min (ref >60)
GFR calc non Af Amer: 57 mL/min — ABNORMAL LOW (ref >60)
Glucose, Bld: 108 mg/dL — ABNORMAL HIGH (ref 70–99)
Potassium: 4.4 mmol/L (ref 3.5–5.1)
Sodium: 142 mmol/L (ref 135–145)

## 2019-05-18 MED ORDER — FUROSEMIDE 10 MG/ML IJ SOLN
40.0000 mg | Freq: Once | INTRAMUSCULAR | Status: AC
  Administered 2019-05-18: 40 mg via INTRAVENOUS
  Filled 2019-05-18: qty 4

## 2019-05-18 MED ORDER — IOHEXOL 350 MG/ML SOLN
INTRAVENOUS | Status: DC | PRN
  Administered 2019-05-17: 50 mL

## 2019-05-18 NOTE — Progress Notes (Signed)
  Pt doing OK this am. Noted increased O2 demand on chronic O2.  Will give 40 mg IV lasix this am instead of his 20 mg po.   Wound appears stable, mildly tender. No obvious bruit or ecchymosis. No hematoma at this time.   Full note pending disposition, at this time, plan for home later this morning, early afternoon.   Legrand Como [address] [place], PA-C  05/18/2019 8:53 AM

## 2019-05-18 NOTE — Progress Notes (Addendum)
  Patient Saturations on Room Air at Rest = 86%  Patient Saturations on [employer] while Ambulating = 82%  Patient Saturations on 3 Liters of oxygen while Ambulating = 92%  Please briefly explain why patient needs home= Patient needs continuous O2 at home to maintain adequate tissue perfusion    [place], PA notified

## 2019-05-18 NOTE — Discharge Instructions (Signed)

## 2019-05-18 NOTE — Care Management (Signed)
05-18-19 Patient has DME 02 at home via [employer] and 2 portable tanks.  Patient has been using at nighttime on 2 Liters. Liter flow has increased to 3 Liters. DME 02 order written. CM called Jeneen Rinks with [employer] and he should have a portable tank delivered within the next hour and a half. CM will alert staff RN. Patient states son will provide transport home. Patient has DME rolling walker at his sons house. No further needs from CM at this time. Bethena Roys, RN,BSN [place]

## 2019-05-18 NOTE — Progress Notes (Signed)
Patient discharged to home. Discharge instructions, medications, and follow up appointments reviewed. All questions and concerns answered. Went home with home O2. IV out CCMD notified.  Paulene Floor, RN

## 2019-05-18 NOTE — Discharge Summary (Addendum)
ELECTROPHYSIOLOGY PROCEDURE DISCHARGE SUMMARY    Patient ID: [name],  MRN: [id number], DOB/AGE: 10-15-1945 73 y.o.  Admit date: 05/17/2019 Discharge date: 05/18/2019  Primary Care Physician: Maury Dus, MD  Primary Cardiologist: Pixie Casino, MD  Electrophysiologist: None  Primary Discharge Diagnosis:  Symptomatic bradycardia status post pacemaker implantation this admission  Secondary Discharge Diagnosis:  Acute on chronic hypoxic respiratory failure OSA Obesity  Allergies  Allergen Reactions  . Prednisone Swelling     Procedures This Admission:  1.  Implantation of a [employer] Jude Micra AV Leadless PPM on 05/17/2019 by Dr. Rayann Heman.  The patient received a Medtronic model number [id number] Leadless PPM. There were no immediate post procedure complications. 2.  CXR on 05/18/19 demonstrated no pneumothorax status post device implantation, but chronic low long volumes.   Brief HPI: [name] is a 73 y.o. male was referred to electrophysiology in the outpatient setting for  consideration of PPM implantation.  Past medical history includes above.  The patient has had symptomatic bradycardia without reversible causes identified.  Risks, benefits, and alternatives to PPM implantation were reviewed with the patient who wished to proceed.   Hospital Course:  The patient was admitted and underwent implantation of a Medtronic Micra AV Leadless PPM with details as outlined above.  He was monitored on telemetry overnight which demonstrated appropriate pacing with good A-V synchrony. Right groin was without hematoma or ecchymosis.  The device was interrogated and found to be functioning normally.  CXR was obtained and demonstrated no pneumothorax status post device implantation, but chronic low lung volumes. Pt noted to have increased O2 demand overnight. Given IV lasix from IVF given during surgery. O2 turned down from HFNC at 8 lpm to his home range of 2-3 lpm with O2  sats >90% at rest. Order placed to add portable tank to his chronic oxygen, per case manager should be available today. Wound care, arm mobility, and restrictions were reviewed with the patient. Instructed to take po lasix x 2 days at home. He states he usually takes it "every few days". The patient was examined and considered stable for discharge to home   Physical Exam: Vitals:   05/18/19 0025 05/18/19 0135 05/18/19 0345 05/18/19 0916  BP: (!) 115/55 113/60 (!) 119/58 123/62  Pulse: (!) 55 93 73   Resp: (!) 22 (!) 21 20   Temp:   98.3 F (36.8 C)   TempSrc:   Oral   SpO2: 91% 96% 95%   Weight:      Height:        GEN- The patient is well appearing, alert and oriented x 3 today.   HEENT: normocephalic, atraumatic; sclera clear, conjunctiva pink; hearing intact; oropharynx clear; neck supple, JVP difficult due to body habitus. Lymph- no cervical lymphadenopathy Lungs- Diminished basilar sounds bilaterally, normal work of breathing.  Heart- Regular rate and rhythm, no murmurs, rubs or gallops, PMI not laterally displaced GI- Obese, soft, non-tender, non-distended, bowel sounds present, no hepatosplenomegaly Extremities- no clubbing, cyanosis, or edema; DP/PT/radial pulses 2+ bilaterally MS- no significant deformity or atrophy Skin- warm and dry, no rash or lesion, left chest without hematoma/ecchymosis Psych- euthymic mood, full affect Neuro- strength and sensation are intact   Labs:   Lab Results  Component Value Date   WBC 9.0 04/19/2019   HGB 15.8 04/19/2019   HCT 49.2 04/19/2019   MCV 93.7 04/19/2019   PLT 203 04/19/2019    Recent Labs  Lab 05/18/19 0234  NA 142  K 4.4  CL 104  CO2 29  BUN 14  CREATININE 1.23  CALCIUM 9.7  GLUCOSE 108*    Discharge Medications:  Allergies as of 05/18/2019      Reactions   Prednisone Swelling      Medication List    TAKE these medications   acetaminophen 500 MG tablet Commonly known as: TYLENOL Take 500 mg by mouth  every 6 (six) hours as needed for moderate pain.   albuterol 108 (90 Base) MCG/ACT inhaler Commonly known as: VENTOLIN HFA Inhale 1-2 puffs into the lungs every 6 (six) hours as needed for wheezing or shortness of breath.   allopurinol 300 MG tablet Commonly known as: ZYLOPRIM Take 300 mg by mouth Daily.   amLODipine 10 MG tablet Commonly known as: NORVASC Take 10 mg by mouth Daily.   aspirin 81 MG EC tablet Take 1 tablet (81 mg total) by mouth daily.   atorvastatin 20 MG tablet Commonly known as: LIPITOR Take 20 mg by mouth once a week.   Calcium 600-200 MG-UNIT tablet Take 1 tablet by mouth daily.   Dialyvite Vitamin D 5000 125 MCG (5000 UT) capsule Generic drug: Cholecalciferol Take 5,000 Units by mouth 3 (three) times a week.   furosemide 20 MG tablet Commonly known as: LASIX Take 1 tablet (20 mg total) by mouth daily.   hydrochlorothiazide 25 MG tablet Commonly known as: HYDRODIURIL Take 25 mg by mouth daily.   losartan 100 MG tablet Commonly known as: COZAAR Take 100 mg by mouth daily.   pantoprazole 40 MG tablet Commonly known as: PROTONIX Take 40 mg by mouth daily.       Disposition:   Follow-up Information    Thompson Grayer, MD Follow up on 08/22/2019.   Specialty: Cardiology Why: at [facility] for 3 month post pacemaker check Contact information: [address] [place] [address] [place] Follow up on 05/31/2019.   Why: at 0830 for post pacemaker wound and device check Contact information: [place] [address] [phone]          Duration of Discharge Encounter: Greater than 30 minutes including physician time.  Signed, Shirley Friar, PA-C  05/18/2019 10:54 AM    I have seen, examined the patient, and reviewed the above assessment and plan.  Changes to above are made where necessary.  On exam, RRR.   Device interrogation reveals normal device function.  He is chronically dyspneic primarily due to morbid obesity.  He is on home o2.  He will not do well long term without significant weight reduction.  Follow-up with pulmonary is encouraged.  Co Sign: Thompson Grayer, MD 05/18/2019 6:13 PM

## 2019-05-19 ENCOUNTER — Telehealth: Payer: Self-pay | Admitting: Internal Medicine

## 2019-05-19 NOTE — Telephone Encounter (Signed)
Pts son, Tharon Aquas called to report that the pt started having chest "soreness" since 3 am this morning. He says it is on and off mostly with movement. He says it hurts across his chest if he clears his throat or coughs. Although he is not coughing much. He is not SOB and can take a deep breath but it sometimes make his chest hurt too. He has been nauseated but a "nausea" pill and it helped. He is sleeping now and appears to be comfortable. He denies any redness, swelling at the pacer site... no fever as far as he knows.   I advised him to monitor... may be discomfort from his Pacer implant. If anything worsens to please call us asap. Will forward to A Tillery PA for review.

## 2019-05-19 NOTE — Telephone Encounter (Signed)
Discussed with Dr. Raliegh Scarlet secondary to PPM with leadless pacemaker placement and stable CXR post.   Possibly from anaesthesia and/or chronic lung issues with noted O2 requirement chronically.   Would re-assure patient and have him make sure he makes an appointment with Pulmonary soon, as we discussed during his admission.   Please have him call back if symptoms worsen. If he has productive cough, fever, or chills he should seek care.   Legrand Como [address] [place], PA-C  05/19/2019 3:04 PM

## 2019-05-19 NOTE — Telephone Encounter (Signed)
lpmtcb 12/3

## 2019-05-19 NOTE — Telephone Encounter (Signed)
Attempted to call 3 times but there was no answer and no answer at the pts home number... will try again later this morning.

## 2019-05-19 NOTE — Telephone Encounter (Signed)
Pt c/o of Chest Pain: STAT if CP now or developed within 24 hours  1. Are you having CP right now? Yes   2. Are you experiencing any other symptoms (ex. SOB, nausea, vomiting, sweating)? Nausea   3. How long have you been experiencing CP? Since 3am on 05/19/19  4. Is your CP continuous or coming and going? Coming and going   5. Have you taken Nitroglycerin? No  Patient's son is calling stating patient had a pacemaker put in on 05/17/19 he was released from the hospital on 05/18/19 at 5 PM. He states patient has been experiencing chest pain and doesn't know if it is soreness from procedure or actual chest pain. Please Advise. ?

## 2019-05-20 ENCOUNTER — Encounter (HOSPITAL_COMMUNITY): Payer: Self-pay | Admitting: Internal Medicine

## 2019-05-20 NOTE — Telephone Encounter (Signed)
[name] verbalized understanding of [name]'s note... he says the pt has improved some and has brought up some sputum that improved him having to clear his throat.   He will call his Pulmonary MD today and make him an appt to follow up... he will also call his PCP Dr. Alyson Ingles if anything worsens.

## 2019-05-23 ENCOUNTER — Telehealth: Payer: Self-pay | Admitting: Internal Medicine

## 2019-05-23 NOTE — Telephone Encounter (Signed)
New Message    Pt is calling and is wondering when he can return to work    Please call

## 2019-05-23 NOTE — Telephone Encounter (Signed)
I believe his groin precautions are only for one week.

## 2019-05-24 NOTE — Telephone Encounter (Signed)
Returned call to Pt.  Pt needs return to work letter.  He requested his return to work day be 12/10, but that is more than a week.  Dated return to work for 12/9-(one week per discussion with AT)  Call placed to supervisor.  Per supervisor requested letter be emailed.  Emailed as requested

## 2019-05-24 NOTE — Telephone Encounter (Signed)
Per pt-call him at 10:00 am.  He is not home right now.

## 2019-05-27 DIAGNOSIS — G4733 Obstructive sleep apnea (adult) (pediatric): Secondary | ICD-10-CM | POA: Diagnosis not present

## 2019-05-29 NOTE — Addendum Note (Signed)
Addendum  created 05/29/19 0906 by Belinda Block, MD   [facility] Staff edited

## 2019-05-29 NOTE — Progress Notes (Signed)
@Patient  ID: [name], male    DOB: 1946/03/17, 73 y.o.   MRN: [id number]  Chief Complaint  Patient presents with  . Follow-up    F/U after pacemaker on 12/1    Referring provider: Maury Dus, MD  HPI:  73 year old male former smoker followed in our office for severe obstructive sleep apnea, COPD  PMH: Obesity, hypertension, CAD, GERD, hyperlipidemia, pacemaker Smoker/ Smoking History: Former smoker.  Quit 1985.  105-pack-year smoking history. Maintenance:  None  Pt of: Patient of Dr. Chase Caller for pulmonary, Dr. Halford Chessman for sleep  05/30/2019  - Visit   73 year old male former smoker followed in our office for severe obstructive sleep apnea and COPD.  Patient presenting to our office for 1 year follow-up.  Patient was last seen in our office on November/2019 by Dr. Chase Caller.  At that point time he did not feel like he was benefiting with his inhalers.  He was requested to follow-up in a year since last being seen patient has had hospitalization in January as well as an emergency room visit in November and it looks like he is status post a pacemaker placement by Dr. Rayann Heman with cardiology.  Cardiology requested the patient have a follow-up with our office.  Previous cardiology notes from earlier this year stated the patient is not compliant with using his BiPAP.  Patient's BiPAP use was last addressed in our office in January/2019.  At that point in time patient had improved compliance with use of his BiPAP on a 75-month follow-up.  His AHI was controlled at 4.6.  Patient presenting today with his son who is now helping coordinate his health.  They report the patient has not used his BiPAP since January/2020 after he broke his leg.  They felt that he did not need his BiPAP based off the fact that he was maintained on oxygen.  There seems to be a major health literacy issue in regards to obstructive sleep apnea.  Patient's son reports that he does not need to use APAP machine because he  is personally on oxygen and this is what his medical provider had told him.  I will explain this to the son as well as the patient that it is very important for [place] to manage his severe obstructive sleep apnea for his overall health especially his cardiac health.  Patient also reports that he had difficulty using his BiPAP because he allowed his wife to use the BiPAP and he change the settings personally.  He did not report this to the DME company.  He reports that his wife no longer needs to use the BiPAP so he is willing to try to use this again.  Patient also reports that every fall and winter he typically gets congestion.  He has a lot of postnasal drip.  He is not using a daily antihistamine.  He does feel that sometimes when he takes the [employer] brand Benadryl this does help the symptoms.  He has upcoming follow-up with cardiology.  Tests:   05/14/2019-SARS-CoV-2-negative  03/24/2017-CBC with differential-eosinophils relative 4.3, eosinophils absolute 0.4  05/18/2019-chest x-ray-bilateral lung base opacities eventuated by low lung volumes, this is likely atelectasis possibly with small effusions, could consider infection if they are consistent with clinical findings, no evidence of pulmonary edema  07/12/2018-CTA chest-no findings of PE, no acute pulmonary findings, streaky bibasilar atelectasis and scarring, reflux of contrast material down the IVC may be due to tricuspid regurgitation  04/15/2017-CT chest high-resolution-no evidence of ILD, few scattered subpleural  nodules measures up to 7 mm in the subpleural left lower lobe, stable and therefore considered benign linear scarring in both lower lobes, aortic arthrosclerosis, severe coronary artery calcification  04/15/2017-echocardiogram-LV ejection fraction 60 to 65%-PA pressure 36  04/08/2017-pulmonary function test-FVC 2.08 (44% predicted), postbronchodilator ratio 77, postbronchodilator FEV1 1.71 (50% predicted), no positive  bronchodilator response, reversibility in FEV1 and mid flows, DLCO 23.86 (68% predicted)  PSG 04/07/14 >> AHI 97  FENO:  Lab Results  Component Value Date   NITRICOXIDE 7 03/24/2017    PFT: PFT Results Latest Ref Rng & Units 04/08/2017  FVC-Pre L 2.08  FVC-Predicted Pre % 44  FVC-Post L 2.24  FVC-Predicted Post % 48  Pre FEV1/FVC % % 73  Post FEV1/FCV % % 77  FEV1-Pre L 1.53  FEV1-Predicted Pre % 44  FEV1-Post L 1.71  DLCO UNC% % 68  DLCO COR %Predicted % 119  TLC L 6.08  TLC % Predicted % 81  RV % Predicted % 135    WALK:  SIX MIN WALK 04/08/2017 07/06/2015 03/27/2015  Medications - POST TEST PRETEST  Supplimental Oxygen during Test? (L/min) No No No  Laps - 6 5  Partial Lap (in Meters) - 0 0.5  Baseline BP (sitting) - 148/60 130/72  Baseline Heartrate - 57 55  Baseline Dyspnea (Borg Scale) - 6 6  Baseline Fatigue (Borg Scale) - 6 6  Baseline SPO2 - 95 97  BP (sitting) - 180/74 152/74  Heartrate - 91 82  Dyspnea (Borg Scale) - 11 11  Fatigue (Borg Scale) - 11 11  SPO2 - 96 97  BP (sitting) - 112/60 122/70  Heartrate - 74 63  SPO2 - 96 97  Stopped or Paused before Six Minutes - No No  Interpretation - (No Data) (No Data)  Distance Completed - 288 240.5  Tech Comments: Pt had to stop twice to get his breath but was able to complete all required laps. Was very SOB during walk. Patient was able to walk entire 6 min. Patient reported knee pain rated a 9 on a scale of 1 to 10. Patient stated pain was not exercise limiting. Patient did not complain of chest pain or any other abnormal s/s. Pateint was able to walk entire 6 min. Patient reported knee pain rated 8 out of 10. Patient was able to continue walk test and pain was not exercise limiting. Patient did not complain of chest pain or any other abnormal s/s.  Provider Comments: - Patient walked 6 laps = 1236ft = 2.24mph = 2.74 METs Patient walked 5.5 laps = 1147ft = 2.27mph = 2.59 METs    Imaging: DG Chest 2 View   Result Date: 05/18/2019 CLINICAL DATA:  Pacemaker. EXAM: CHEST - 2 VIEW COMPARISON:  04/19/2019 FINDINGS: Mild enlargement of the cardiac silhouette. No mediastinal or hilar masses. Lung volumes are low. There is opacity at both lung bases mostly obscuring the hemidiaphragms, consistent with atelectasis possible small effusions. Remainder of the lungs is clear. No pneumothorax. Skeletal structures are grossly intact. IMPRESSION: 1. Bilateral lung base opacities accentuated by low lung volumes. This is likely atelectasis possibly with small effusions. Consider infection if there are consistent clinical findings. No evidence of pulmonary edema. 2. Mild cardiomegaly. Electronically Signed   By: Lajean Manes M.D.   On: 05/18/2019 09:18   EP PPM/ICD IMPLANT  Result Date: 05/17/2019 SURGEON: Thompson Grayer, MD PREPROCEDURE DIAGNOSIS: 1. Mobitz I second degree heart block POSTPROCEDURE DIAGNOSIS: 1. Mobitz I second degree heart block PROCEDURES: 1.  Leadless Pacemaker implantation. 2.  Femoral venous ultrasound access INTRODUCTION:  KYION GAUTIER is a 73 y.o. male with a history of symptomatic second degree heart block who presents today for pacemaker implantation.  He is morbidly obese and has chronic SOB related to his size.  It is felt that a transvenous pacemaker implant would carry extraordinary risks for him.  It is felt that leadless pacing is a safer option for him.  General anesthesia is required for the procedure today. DESCRIPTION OF PROCEDURE: Informed written consent was obtained, and the patient was brought to the electrophysiology lab in a fasting state. The patient could not lie flat and required general anesthesia for this procedure.  Using a percutaneous Seldinger technique, an 8-French was placed into the right common femoral vein.  Direct Ultrasound guidance was used today for right common femoral venous access with normal vessel patency.  Ultrasound images are captured and stored in the patients  chart. The 8 french sheath was exchanged for an 27 french sheath. A Medtronic Micra AV model [id number] (SN [id number]) Leadless Cardiac Pacemaker was advanced through the right femoral vein into the right ventricular apical septal position and fixed to the myocardial wall.   In this location, R waves measured 14 mV with an impedance of  1050 Ohms and a threshold of 0.38 V@0 .20msec. Stability of the device was demonstrated with a "tug test". The device was disconnected from the implantation apparatus which was then removed from the body and the sheaths were aspirated and flushed. The sheaths were removed and hemostasis was assured with Perclose. EBL<53ml.  There were no early apparent complications. Permanent Pacemaker Indication: Documented non-reversible symptomatic bradycardia due to second degree and/or third degree atrioventricular block.  CONCLUSIONS: 1.Successful implantation of a Medtronic Micra leadless pacemaker. 2. No early apparent complications. James Allred,MD 5:43 PM 05/17/2019    Lab Results:  CBC    Component Value Date/Time   WBC 9.0 04/19/2019 1149   RBC 5.25 04/19/2019 1149   HGB 15.8 04/19/2019 1149   HCT 49.2 04/19/2019 1149   PLT 203 04/19/2019 1149   MCV 93.7 04/19/2019 1149   MCH 30.1 04/19/2019 1149   MCHC 32.1 04/19/2019 1149   RDW 13.8 04/19/2019 1149   LYMPHSABS 2.5 11/01/2017 1702   MONOABS 0.8 11/01/2017 1702   EOSABS 0.2 11/01/2017 1702   BASOSABS 0.0 11/01/2017 1702    BMET    Component Value Date/Time   NA 142 05/18/2019 0234   K 4.4 05/18/2019 0234   CL 104 05/18/2019 0234   CO2 29 05/18/2019 0234   GLUCOSE 108 (H) 05/18/2019 0234   BUN 14 05/18/2019 0234   CREATININE 1.23 05/18/2019 0234   CALCIUM 9.7 05/18/2019 0234   CALCIUM 10.3 (H) 11/05/2017 0510   GFRNONAA 57 (L) 05/18/2019 0234   GFRAA >60 05/18/2019 0234    BNP    Component Value Date/Time   BNP 78.0 04/19/2019 1149    ProBNP    Component Value Date/Time   PROBNP 190.0 (H)  03/24/2017 1634    Specialty Problems      Pulmonary Problems   OSA (obstructive sleep apnea)    NPSG 03/2014:  AHI 97/hr, cpap to 16cm.        Pulmonary nodule   Asthma, chronic, unspecified asthma severity, with acute exacerbation   Chronic respiratory failure with hypoxia (HCC)   Allergic rhinitis      Allergies  Allergen Reactions  . Prednisone Swelling    Immunization History  Administered Date(s) Administered  .  Fluad Quad(high Dose 65+) 04/27/2019  . Influenza Whole 03/19/2009, 04/17/2010, 03/17/2011  . Influenza, High Dose Seasonal PF 04/05/2018  . Influenza-Unspecified 03/16/2014, 02/07/2015, 03/09/2017  . Pneumococcal Conjugate-13 05/06/2016  . Pneumococcal Polysaccharide-23 03/19/2009    Past Medical History:  Diagnosis Date  . Arthritis    "left shoulder" (02/09/2015)  . Blind left eye   . CAD (coronary artery disease)   . Closed fracture of lateral portion of right tibial plateau 11/01/2017  . Complication of anesthesia    "they give me too much anesthesia & had to put me on life support for a little bit w/gallbladder OR"  . Coronary artery disease   . Former smoker   . GERD (gastroesophageal reflux disease)   . Gout   . History of bleeding ulcers   . History of gout   . History of peptic ulcer disease   . Hyperlipidemia   . Hypertension   . Hypertensive heart disease without CHF   . Kidney stones   . Lumbar disc disease   . Myocardial infarction [facility]) 1995; 2007  . Obesity   . OSA (obstructive sleep apnea)   . Pneumonia X 2  . Prostate cancer ([place])   . Pulmonary nodule   . Second degree Mobitz I AV block     Tobacco History: Social History   Tobacco Use  Smoking Status Former Smoker  . Packs/day: 3.00  . Years: 35.00  . Pack years: 105.00  . Types: Cigarettes  . Quit date: 06/17/1983  . Years since quitting: 35.9  Smokeless Tobacco Never Used  Tobacco Comment   "quit smoking cigarettes in 1985   Counseling given: Not Answered  Comment: "quit smoking cigarettes in 1985   Continue to not smoke  Outpatient Encounter Medications as of 05/30/2019  Medication Sig  . acetaminophen (TYLENOL) 500 MG tablet Take 500 mg by mouth every 6 (six) hours as needed for moderate pain.  Marland Kitchen albuterol (VENTOLIN HFA) 108 (90 Base) MCG/ACT inhaler Inhale 1-2 puffs into the lungs every 6 (six) hours as needed for wheezing or shortness of breath.  . allopurinol (ZYLOPRIM) 300 MG tablet Take 300 mg by mouth Daily.   Marland Kitchen amLODipine (NORVASC) 10 MG tablet Take 10 mg by mouth Daily.   Marland Kitchen aspirin EC 81 MG EC tablet Take 1 tablet (81 mg total) by mouth daily.  Marland Kitchen atorvastatin (LIPITOR) 20 MG tablet Take 20 mg by mouth once a week.   . Calcium 600-200 MG-UNIT tablet Take 1 tablet by mouth daily.  . Cholecalciferol (DIALYVITE VITAMIN D 5000) 125 MCG (5000 UT) capsule Take 5,000 Units by mouth 3 (three) times a week.   . furosemide (LASIX) 20 MG tablet Take 1 tablet (20 mg total) by mouth daily.  . hydrochlorothiazide (HYDRODIURIL) 25 MG tablet Take 25 mg by mouth daily.  Marland Kitchen losartan (COZAAR) 100 MG tablet Take 100 mg by mouth daily.  . pantoprazole (PROTONIX) 40 MG tablet Take 40 mg by mouth daily.   No facility-administered encounter medications on file as of 05/30/2019.     Review of Systems  Review of Systems  Constitutional: Positive for fatigue. Negative for activity change, chills, fever and unexpected weight change.  HENT: Positive for congestion, postnasal drip and rhinorrhea. Negative for sinus pressure, sinus pain and sore throat.   Eyes: Negative.   Respiratory: Positive for cough and shortness of breath. Negative for wheezing.   Cardiovascular: Negative for chest pain and palpitations.  Gastrointestinal: Negative for constipation, diarrhea, nausea and vomiting.  Endocrine:  Negative.   Genitourinary: Negative.   Musculoskeletal: Negative.   Skin: Negative.   Neurological: Negative for dizziness and headaches.   Psychiatric/Behavioral: Negative.  Negative for dysphoric mood. The patient is not nervous/anxious.   All other systems reviewed and are negative.    Physical Exam  BP 122/84 (BP Location: Left Arm, Patient Position: Sitting, Cuff Size: Large)   Pulse 61   Temp 98.1 F (36.7 C) (Temporal)   Ht 6\' 2"  (1.88 m)   Wt (!) 316 lb (143.3 kg)   SpO2 95% Comment: on RA  BMI 40.57 kg/m   Wt Readings from Last 5 Encounters:  05/30/19 (!) 316 lb (143.3 kg)  05/17/19 (!) 318 lb 5.5 oz (144.4 kg)  05/02/19 (!) 312 lb (141.5 kg)  04/27/19 (!) 323 lb 3.2 oz (146.6 kg)  04/19/19 300 lb (136.1 kg)    BMI Readings from Last 5 Encounters:  05/30/19 40.57 kg/m  05/17/19 40.87 kg/m  05/02/19 41.16 kg/m  04/27/19 42.64 kg/m  04/19/19 39.58 kg/m     Physical Exam Vitals and nursing note reviewed.  Constitutional:      General: He is not in acute distress.    Appearance: He is obese.     Comments: Elderly male  HENT:     Head: Normocephalic and atraumatic.     Right Ear: Hearing and external ear normal.     Left Ear: Hearing and external ear normal.     Nose: Congestion and rhinorrhea present. No mucosal edema.     Right Turbinates: Not enlarged.     Left Turbinates: Not enlarged.     Mouth/Throat:     Mouth: Mucous membranes are dry.     Dentition: Abnormal dentition.     Pharynx: Oropharynx is clear. No oropharyngeal exudate.     Comments: Postnasal drip Eyes:     Pupils: Pupils are equal, round, and reactive to light.  Cardiovascular:     Rate and Rhythm: Normal rate and regular rhythm.     Pulses: Normal pulses.     Heart sounds: Normal heart sounds. No murmur.  Pulmonary:     Effort: Pulmonary effort is normal.     Breath sounds: No decreased breath sounds, wheezing or rales.     Comments:   Noisy chest Musculoskeletal:     Cervical back: Normal range of motion.  Lymphadenopathy:     Cervical: No cervical adenopathy.  Skin:    General: Skin is warm and dry.      Capillary Refill: Capillary refill takes less than 2 seconds.     Findings: No erythema or rash.  Neurological:     General: No focal deficit present.     Mental Status: He is alert and oriented to person, place, and time.     Motor: No weakness.     Coordination: Coordination normal.     Gait: Gait is intact. Gait normal.  Psychiatric:        Mood and Affect: Mood normal.        Behavior: Behavior normal. Behavior is cooperative.        Thought Content: Thought content normal.        Judgment: Judgment normal.       Assessment & Plan:   Second degree AV block Plan: Continue to follow-up with cardiology  Allergic rhinitis Seasonal flares especially in fall and winter months Peripheral eosinophilia on blood work  Plan: Start daily antihistamine in fall and winter months Start nasal saline rinses 1-2 times  daily   Chronic respiratory failure with hypoxia (HCC) Plan: Continue oxygen therapy as prescribed  OSA (obstructive sleep apnea) Noncompliant with BiPAP for almost a year Patient felt that oxygen therapy was sufficient for treating his severe obstructive sleep apnea Last office visit that directly addressed obstructive sleep apnea was in January/2019  Plan: Patient to resume BiPAP therapy tonight Order placed to DME to ensure patient is on correct settings Explained to patient and son multiple times the importance of him treating his severe obstructive sleep apnea and that simply placing patient on oxygen is not sufficient for treatment of his severe obstructive sleep apnea especially given his severe cardiac history We will schedule close follow-up with our office in 4 to 6 weeks to see Dr. Halford Chessman    Return in about 6 weeks (around 07/11/2019), or if symptoms worsen or fail to improve, for Follow up with Dr. Halford Chessman.   Lauraine Rinne, NP 05/30/2019   This appointment was 30 minutes long with over 50% of the time in direct face-to-face patient care, assessment, plan  of care, and follow-up.

## 2019-05-30 ENCOUNTER — Encounter: Payer: Self-pay | Admitting: Pulmonary Disease

## 2019-05-30 ENCOUNTER — Other Ambulatory Visit: Payer: Self-pay

## 2019-05-30 ENCOUNTER — Ambulatory Visit: Payer: Medicare HMO | Admitting: Pulmonary Disease

## 2019-05-30 VITALS — BP 122/84 | HR 61 | Temp 98.1°F | Ht 74.0 in | Wt 316.0 lb

## 2019-05-30 DIAGNOSIS — J9611 Chronic respiratory failure with hypoxia: Secondary | ICD-10-CM | POA: Diagnosis not present

## 2019-05-30 DIAGNOSIS — J309 Allergic rhinitis, unspecified: Secondary | ICD-10-CM | POA: Insufficient documentation

## 2019-05-30 DIAGNOSIS — G4733 Obstructive sleep apnea (adult) (pediatric): Secondary | ICD-10-CM

## 2019-05-30 DIAGNOSIS — J302 Other seasonal allergic rhinitis: Secondary | ICD-10-CM | POA: Diagnosis not present

## 2019-05-30 DIAGNOSIS — I441 Atrioventricular block, second degree: Secondary | ICD-10-CM

## 2019-05-30 NOTE — Assessment & Plan Note (Signed)
Noncompliant with BiPAP for almost a year Patient felt that oxygen therapy was sufficient for treating his severe obstructive sleep apnea Last office visit that directly addressed obstructive sleep apnea was in January/2019  Plan: Patient to resume BiPAP therapy tonight Order placed to DME to ensure patient is on correct settings Explained to patient and son multiple times the importance of him treating his severe obstructive sleep apnea and that simply placing patient on oxygen is not sufficient for treatment of his severe obstructive sleep apnea especially given his severe cardiac history We will schedule close follow-up with our office in 4 to 6 weeks to see Dr. Halford Chessman

## 2019-05-30 NOTE — Assessment & Plan Note (Signed)
Seasonal flares especially in fall and winter months Peripheral eosinophilia on blood work  Plan: Start daily antihistamine in fall and winter months Start nasal saline rinses 1-2 times daily

## 2019-05-30 NOTE — Assessment & Plan Note (Signed)
Plan: Continue to follow-up with cardiology 

## 2019-05-30 NOTE — Patient Instructions (Addendum)
You were seen today by Lauraine Rinne, NP  for:   1. OSA (obstructive sleep apnea)  - Ambulatory Referral for DME  We recommend that you RESUME using your BIPAP daily >>>Keep up the hard work using your device >>> Goal should be wearing this for the entire night that you are sleeping, at least 4 to 6 hours  Remember:  . Do not drive or operate heavy machinery if tired or drowsy.  . Please notify the supply company and office if you are unable to use your device regularly due to missing supplies or machine being broken.  . Work on maintaining a healthy weight and following your recommended nutrition plan  . Maintain proper daily exercise and movement  . Maintaining proper use of your device can also help improve management of other chronic illnesses such as: Blood pressure, blood sugars, and weight management.   BiPAP/ CPAP Cleaning:  >>>Clean weekly, with Dawn soap, and bottle brush.  Set up to air dry. >>> Wipe mask out daily with wet wipe or towelette  Your DME company is [employer]: [phone]   2. Chronic respiratory failure with hypoxia (HCC)  Continue oxygen therapy as prescribed  >>>maintain oxygen saturations greater than 88 percent  >>>if unable to maintain oxygen saturations please contact the office  >>>do not smoke with oxygen  >>>can use nasal saline gel or nasal saline rinses to moisturize nose if oxygen causes dryness   3. Seasonal allergic rhinitis, unspecified trigger  Please start taking a daily antihistamine:  >>>choose one of: zyrtec, claritin, allegra, or xyzal  >>>these are over the counter medications  >>>can choose generic option  >>>take daily  >>>this medication helps with allergies, post nasal drip, and cough   Can also start nasal saline rinses 1-2 times daily to help with nasal congestion during the fall and winter months   We recommend today:  Orders Placed This Encounter  Procedures  . Ambulatory Referral for DME    Referral Priority:    Routine    Referral Type:   Durable Medical Equipment Purchase    Number of Visits Requested:   1   Orders Placed This Encounter  Procedures  . Ambulatory Referral for DME   No orders of the defined types were placed in this encounter.   Follow Up:    Return in about 6 weeks (around 07/11/2019), or if symptoms worsen or fail to improve, for Follow up with Dr. Halford Chessman.   Please do your part to reduce the spread of COVID-19:      Reduce your risk of any infection  and COVID19 by using the similar precautions used for avoiding the common cold or flu:  Marland Kitchen Wash your hands often with soap and warm water for at least 20 seconds.  If soap and water are not readily available, use an alcohol-based hand sanitizer with at least 60% alcohol.  . If coughing or sneezing, cover your mouth and nose by coughing or sneezing into the elbow areas of your shirt or coat, into a tissue or into your sleeve (not your hands). Langley Gauss A MASK when in public  . Avoid shaking hands with others and consider head nods or verbal greetings only. . Avoid touching your eyes, nose, or mouth with unwashed hands.  . Avoid close contact with people who are sick. . Avoid places or events with large numbers of people in one location, like concerts or sporting events. . If you have some symptoms but not all  symptoms, continue to monitor at home and seek medical attention if your symptoms worsen. . If you are having a medical emergency, call 911.   [place] / e-Visit: [email or web address]         [facility]: [facility]: [phone]                   [facility]: [phone]     It is flu season:   >>> Best ways to protect herself from the flu: Receive the yearly flu vaccine, practice good hand hygiene washing with soap and also using hand sanitizer when available,  eat a nutritious meals, get adequate rest, hydrate appropriately   Please contact the office if your symptoms worsen or you have concerns that you are not improving.   Thank you for choosing [facility] for your healthcare, and for allowing [place] to partner with you on your healthcare journey. I am thankful to be able to provide care to you today.   Wyn Quaker FNP-C   Sleep Apnea Sleep apnea affects breathing during sleep. It causes breathing to stop for a short time or to become shallow. It can also increase the risk of:  Heart attack.  Stroke.  Being very overweight (obese).  Diabetes.  Heart failure.  Irregular heartbeat. The goal of treatment is to help you breathe normally again. What are the causes? There are three kinds of sleep apnea:  Obstructive sleep apnea. This is caused by a blocked or collapsed airway.  Central sleep apnea. This happens when the brain does not send the right signals to the muscles that control breathing.  Mixed sleep apnea. This is a combination of obstructive and central sleep apnea. The most common cause of this condition is a collapsed or blocked airway. This can happen if:  Your throat muscles are too relaxed.  Your tongue and tonsils are too large.  You are overweight.  Your airway is too small. What increases the risk?  Being overweight.  Smoking.  Having a small airway.  Being older.  Being male.  Drinking alcohol.  Taking medicines to calm yourself (sedatives or tranquilizers).  Having family members with the condition. What are the signs or symptoms?  Trouble staying asleep.  Being sleepy or tired during the day.  Getting angry a lot.  Loud snoring.  Headaches in the morning.  Not being able to focus your mind (concentrate).  Forgetting things.  Less interest in sex.  Mood swings.  Personality changes.  Feelings of sadness (depression).  Waking up a lot during the night to pee  (urinate).  Dry mouth.  Sore throat. How is this diagnosed?  Your medical history.  A physical exam.  A test that is done when you are sleeping (sleep study). The test is most often done in a sleep lab but may also be done at home. How is this treated?   Sleeping on your side.  Using a medicine to get rid of mucus in your nose (decongestant).  Avoiding the use of alcohol, medicines to help you relax, or certain pain medicines (narcotics).  Losing weight, if needed.  Changing your diet.  Not smoking.  Using a machine to open your airway while you sleep, such as: ? An oral appliance. This is a mouthpiece that shifts your lower jaw forward. ? A CPAP device. This device blows air through a mask when you breathe out (exhale). ?  An EPAP device. This has valves that you put in each nostril. ? A BPAP device. This device blows air through a mask when you breathe in (inhale) and breathe out.  Having surgery if other treatments do not work. It is important to get treatment for sleep apnea. Without treatment, it can lead to:  High blood pressure.  Coronary artery disease.  In men, not being able to have an erection (impotence).  Reduced thinking ability. Follow these instructions at home: Lifestyle  Make changes that your doctor recommends.  Eat a healthy diet.  Lose weight if needed.  Avoid alcohol, medicines to help you relax, and some pain medicines.  Do not use any products that contain nicotine or tobacco, such as cigarettes, e-cigarettes, and chewing tobacco. If you need help quitting, ask your doctor. General instructions  Take over-the-counter and prescription medicines only as told by your doctor.  If you were given a machine to use while you sleep, use it only as told by your doctor.  If you are having surgery, make sure to tell your doctor you have sleep apnea. You may need to bring your device with you.  Keep all follow-up visits as told by your doctor.  This is important. Contact a doctor if:  The machine that you were given to use during sleep bothers you or does not seem to be working.  You do not get better.  You get worse. Get help right away if:  Your chest hurts.  You have trouble breathing in enough air.  You have an uncomfortable feeling in your back, arms, or stomach.  You have trouble talking.  One side of your body feels weak.  A part of your face is hanging down. These symptoms may be an emergency. Do not wait to see if the symptoms will go away. Get medical help right away. Call your local emergency services (911 in the [place] Do not drive yourself to the hospital. Summary  This condition affects breathing during sleep.  The most common cause is a collapsed or blocked airway.  The goal of treatment is to help you breathe normally while you sleep. This information is not intended to replace advice given to you by your health care provider. Make sure you discuss any questions you have with your health care provider. Document Released: 03/11/2008 Document Revised: 03/19/2018 Document Reviewed: 01/26/2018 Elsevier Patient Education  2020 [employer].   Allergic Rhinitis, Adult Allergic rhinitis is an allergic reaction that affects the mucous membrane inside the nose. It causes sneezing, a runny or stuffy nose, and the feeling of mucus going down the back of the throat (postnasal drip). Allergic rhinitis can be mild to severe. There are two types of allergic rhinitis:  Seasonal. This type is also called hay fever. It happens only during certain seasons.  Perennial. This type can happen at any time of the year. What are the causes? This condition happens when the body's defense system (immune system) responds to certain harmless substances called allergens as though they were germs.  Seasonal allergic rhinitis is triggered by pollen, which can come from grasses, trees, and weeds. Perennial allergic rhinitis may be  caused by:  House dust mites.  Pet dander.  Mold spores. What are the signs or symptoms? Symptoms of this condition include:  Sneezing.  Runny or stuffy nose (nasal congestion).  Postnasal drip.  Itchy nose.  Tearing of the eyes.  Trouble sleeping.  Daytime sleepiness. How is this diagnosed? This condition may be  diagnosed based on:  Your medical history.  A physical exam.  Tests to check for related conditions, such as: ? Asthma. ? Pink eye. ? Ear infection. ? Upper respiratory infection.  Tests to find out which allergens trigger your symptoms. These may include skin or blood tests. How is this treated? There is no cure for this condition, but treatment can help control symptoms. Treatment may include:  Taking medicines that block allergy symptoms, such as antihistamines. Medicine may be given as a shot, nasal spray, or pill.  Avoiding the allergen.  Desensitization. This treatment involves getting ongoing shots until your body becomes less sensitive to the allergen. This treatment may be done if other treatments do not help.  If taking medicine and avoiding the allergen does not work, new, stronger medicines may be prescribed. Follow these instructions at home:  Find out what you are allergic to. Common allergens include smoke, dust, and pollen.  Avoid the things you are allergic to. These are some things you can do to help avoid allergens: ? Replace carpet with wood, tile, or vinyl flooring. Carpet can trap dander and dust. ? Do not smoke. Do not allow smoking in your home. ? Change your heating and air conditioning filter at least once a month. ? During allergy season:  Keep windows closed as much as possible.  Plan outdoor activities when pollen counts are lowest. This is usually during the evening hours.  When coming indoors, change clothing and shower before sitting on furniture or bedding.  Take over-the-counter and prescription medicines only  as told by your health care provider.  Keep all follow-up visits as told by your health care provider. This is important. Contact a health care provider if:  You have a fever.  You develop a persistent cough.  You make whistling sounds when you breathe (you wheeze).  Your symptoms interfere with your normal daily activities. Get help right away if:  You have shortness of breath. Summary  This condition can be managed by taking medicines as directed and avoiding allergens.  Contact your health care provider if you develop a persistent cough or fever.  During allergy season, keep windows closed as much as possible. This information is not intended to replace advice given to you by your health care provider. Make sure you discuss any questions you have with your health care provider. Document Released: 02/25/2001 Document Revised: 05/15/2017 Document Reviewed: 07/10/2016 Elsevier Patient Education  2020 [employer].

## 2019-05-30 NOTE — Assessment & Plan Note (Signed)
Plan: Continue oxygen therapy as prescribed 

## 2019-05-30 NOTE — Progress Notes (Signed)
Reviewed and agree with assessment/plan.   Jameika Kinn, MD [facility] 06/11/2016, 12:24 PM Pager:  [phone]  

## 2019-05-31 ENCOUNTER — Telehealth: Payer: Self-pay

## 2019-05-31 ENCOUNTER — Telehealth (INDEPENDENT_AMBULATORY_CARE_PROVIDER_SITE_OTHER): Payer: Medicare HMO | Admitting: *Deleted

## 2019-05-31 DIAGNOSIS — I441 Atrioventricular block, second degree: Secondary | ICD-10-CM

## 2019-05-31 DIAGNOSIS — Z950 Presence of cardiac pacemaker: Secondary | ICD-10-CM

## 2019-05-31 LAB — CUP PACEART REMOTE DEVICE CHECK
Battery Remaining Longevity: 96 mo
Battery Voltage: 3.17 V
Brady Statistic AS VP Percent: 79.74 %
Brady Statistic AS VS Percent: 0.19 %
Brady Statistic RV Percent Paced: 96.31 %
Date Time Interrogation Session: 20201215124500
Implantable Pulse Generator Implant Date: 20201201
Lead Channel Impedance Value: 680 Ohm
Lead Channel Pacing Threshold Amplitude: 0.375 V
Lead Channel Pacing Threshold Pulse Width: 0.24 ms
Lead Channel Sensing Intrinsic Amplitude: 11.475 mV
Lead Channel Setting Pacing Amplitude: 2.25 V
Lead Channel Setting Pacing Pulse Width: 0.24 ms
Lead Channel Setting Sensing Sensitivity: 2 mV

## 2019-05-31 NOTE — Progress Notes (Signed)
Follow-up with Oda Kilts in 6 weeks.

## 2019-05-31 NOTE — Telephone Encounter (Signed)
Spoke with patient. See virtual visit encounter from 05/31/19 for details.

## 2019-05-31 NOTE — Progress Notes (Signed)
Patient verbally consented to leadless pacemaker post-implant check via virtual visit due to Covid-19. Transitioned to virtual due to elevator maintenance, patient unable to climb stairs to office.  Manual leadless pacemaker transmission reviewed. Normal device function. Sensing, threshold, and impedance trends stable. 0% AV conduction. 79.7% AM-VP, 16.6% VP only. 0.2% AM-VS, 3.5% VS only. Discussed with [employer] representative--may be some opportunity to increase AM-VP % with in-office interrogation. Will review with Dr. Rayann Heman for recommendations. [facility] on 08/17/19 and ROV with Dr. Rayann Heman on 08/22/19.  Patient report groin site is well healed, denies drainage, swelling, or ecchymosis. Patient reports that from a pacemaker standpoint, he thinks he is doing well, though he is currently nauseous and has been dealing with some intermittent GI symptoms over the past week or so. Plans to contact his PCP tomorrow for recommendations. Advised will call back with recommendations from Dr. Rayann Heman later this week regarding need for in-office interrogation/possible reprogramming. Patient in agreement with plan, no further questions at this time.

## 2019-05-31 NOTE — Telephone Encounter (Signed)
Pt son called to let us know he sent the transmission for the pt. I told him the nurse will review it and give him a call back.

## 2019-06-01 ENCOUNTER — Telehealth: Payer: Self-pay

## 2019-06-01 NOTE — Telephone Encounter (Signed)
I spoke with the pt and let him know that no one from the office called today.

## 2019-06-06 ENCOUNTER — Telehealth: Payer: Self-pay | Admitting: *Deleted

## 2019-06-06 NOTE — Telephone Encounter (Signed)
Per Dr. Rayann Heman, patient should be scheduled for 6 week f/u with A. Tillery, PA, as wound check was transitioned to virtual. Unable to schedule at this time due to schedule on hold. Advised pt that I will ask scheduler to contact him with appointment. Pt is agreeable to plan, no questions at this time.

## 2019-06-29 ENCOUNTER — Encounter: Payer: Self-pay | Admitting: Internal Medicine

## 2019-06-29 ENCOUNTER — Ambulatory Visit: Payer: Medicare HMO | Admitting: Internal Medicine

## 2019-06-29 ENCOUNTER — Other Ambulatory Visit: Payer: Self-pay

## 2019-06-29 VITALS — BP 152/92 | HR 69 | Ht 74.0 in | Wt 311.0 lb

## 2019-06-29 DIAGNOSIS — I10 Essential (primary) hypertension: Secondary | ICD-10-CM | POA: Diagnosis not present

## 2019-06-29 DIAGNOSIS — I441 Atrioventricular block, second degree: Secondary | ICD-10-CM

## 2019-06-29 DIAGNOSIS — E785 Hyperlipidemia, unspecified: Secondary | ICD-10-CM | POA: Diagnosis not present

## 2019-06-29 DIAGNOSIS — Z950 Presence of cardiac pacemaker: Secondary | ICD-10-CM

## 2019-06-29 NOTE — Patient Instructions (Signed)
Medication Instructions:  NO CHANGES *If you need a refill on your cardiac medications before your next appointment, please call your pharmacy*  Lab Work: NONE If you have labs (blood work) drawn today and your tests are completely normal, you will receive your results only by: Marland Kitchen MyChart Message (if you have MyChart) OR . A paper copy in the mail If you have any lab test that is abnormal or we need to change your treatment, we will call you to review the results.    Follow-Up: At [facility], you and your health needs are our priority.  As part of our continuing mission to provide you with exceptional heart care, we have created designated Provider Care Teams.  These Care Teams include your primary Cardiologist (physician) and Advanced Practice Providers (APPs -  Physician Assistants and Nurse Practitioners) who all work together to provide you with the care you need, when you need it.  Your next appointment:   12 month(s)  The format for your next appointment:   Either In Person or Virtual  Provider:   Raliegh Ip Mali Hilty, MD

## 2019-06-29 NOTE — Progress Notes (Signed)
OFFICE NOTE  Chief Complaint:  Follow-up pacemaker  Primary Care Physician: Maury Dus, MD  HPI:  [name] is a 74 y.o. male with a past medial history significant for coronary disease and in January underwent left heart cath which showed an occluded distal circumflex.  Medical therapy was recommended.  This was complicated by some Wenkebach and he was evaluated by EP but not felt to be candidate for pacing.  He then developed progressive fatigue and worsening symptoms and was ultimately referred for pacemaker.  He underwent leadless pacemaker placement without complication and does report improvement in his symptoms.  He still remains significantly overweight and less active for which he will need to continue to work on.  He has an in office pacer check in about a week.  He also has sleep apnea but has been intolerant of BiPAP therapy and has follow-up in a week with his pulmonologist.  PMHx:  Past Medical History:  Diagnosis Date  . Arthritis    "left shoulder" (02/09/2015)  . Blind left eye   . CAD (coronary artery disease)   . Closed fracture of lateral portion of right tibial plateau 11/01/2017  . Complication of anesthesia    "they give me too much anesthesia & had to put me on life support for a little bit w/gallbladder OR"  . Coronary artery disease   . Former smoker   . GERD (gastroesophageal reflux disease)   . Gout   . History of bleeding ulcers   . History of gout   . History of peptic ulcer disease   . Hyperlipidemia   . Hypertension   . Hypertensive heart disease without CHF   . Kidney stones   . Lumbar disc disease   . Myocardial infarction [facility]) 1995; 2007  . Obesity   . OSA (obstructive sleep apnea)   . Pneumonia X 2  . Prostate cancer ([place])   . Pulmonary nodule   . Second degree Mobitz I AV block     Past Surgical History:  Procedure Laterality Date  . APPENDECTOMY    . APPENDECTOMY  ~ 1985  . BACK SURGERY    . CARDIAC CATHETERIZATION N/A  02/08/2015   Procedure: Left Heart Cath and Coronary Angiography;  Surgeon: Leonie Man, MD;  Location: [place] [facility];  Service: Cardiovascular;  Laterality: N/A;  . CARDIAC CATHETERIZATION N/A 02/08/2015   Procedure: Coronary Stent Intervention;  Surgeon: Leonie Man, MD;  Location: [place] [facility];  Service: Cardiovascular;  Laterality: N/A;  . CHOLECYSTECTOMY    . CORONARY ANGIOPLASTY WITH STENT PLACEMENT  ~ 1995; 2007   "1 stent; 2 stents"  . LAPAROSCOPIC CHOLECYSTECTOMY    . LEFT HEART CATH AND CORONARY ANGIOGRAPHY N/A 07/13/2018   Procedure: LEFT HEART CATH AND CORONARY ANGIOGRAPHY;  Surgeon: Martinique, Peter M, MD;  Location: [place] [facility];  Service: Cardiovascular;  Laterality: N/A;  . LITHOTRIPSY  X 1  . LUMBAR LAMINECTOMY    . ORIF TIBIA PLATEAU Right 11/05/2017   Procedure: OPEN REDUCTION INTERNAL FIXATION (ORIF) TIBIAL PLATEAU;  Surgeon: Shona Needles, MD;  Location: [place];  Service: Orthopedics;  Laterality: Right;  . PACEMAKER LEADLESS INSERTION N/A 05/17/2019   Procedure: PACEMAKER LEADLESS INSERTION;  Surgeon: Thompson Grayer, MD;  Location: [place] [facility];  Service: Cardiovascular;  Laterality: N/A;  . POSTERIOR LUMBAR FUSION  2003?  . PROSTATE BIOPSY  2015    FAMHx:  Family History  Problem Relation Age of Onset  .  Hypertension Mother   . Coronary artery disease Mother   . Aneurysm Father   . Hypertension Father   . COPD Sister   . Sleep apnea Other        3 siblings    SOCHx:   reports that he quit smoking about 36 years ago. His smoking use included cigarettes. He has a 105.00 pack-year smoking history. He has never used smokeless tobacco. He reports current alcohol use. He reports that he does not use drugs.  ALLERGIES:  Allergies  Allergen Reactions  . Prednisone Swelling    ROS: Pertinent items noted in HPI and remainder of comprehensive ROS otherwise negative.  HOME MEDS: Current Outpatient Medications on File Prior to Visit   Medication Sig Dispense Refill  . acetaminophen (TYLENOL) 500 MG tablet Take 500 mg by mouth every 6 (six) hours as needed for moderate pain.    Marland Kitchen albuterol (VENTOLIN HFA) 108 (90 Base) MCG/ACT inhaler Inhale 1-2 puffs into the lungs every 6 (six) hours as needed for wheezing or shortness of breath.    . allopurinol (ZYLOPRIM) 300 MG tablet Take 300 mg by mouth Daily.     Marland Kitchen amLODipine (NORVASC) 10 MG tablet Take 10 mg by mouth Daily.     Marland Kitchen aspirin EC 81 MG EC tablet Take 1 tablet (81 mg total) by mouth daily.    Marland Kitchen atorvastatin (LIPITOR) 20 MG tablet Take 20 mg by mouth once a week.     . Calcium 600-200 MG-UNIT tablet Take 1 tablet by mouth daily.    . Cholecalciferol (DIALYVITE VITAMIN D 5000) 125 MCG (5000 UT) capsule Take 5,000 Units by mouth 3 (three) times a week.     . furosemide (LASIX) 20 MG tablet Take 1 tablet (20 mg total) by mouth daily. 30 tablet 6  . hydrochlorothiazide (HYDRODIURIL) 25 MG tablet Take 25 mg by mouth daily.    Marland Kitchen losartan (COZAAR) 100 MG tablet Take 100 mg by mouth daily.    . pantoprazole (PROTONIX) 40 MG tablet Take 40 mg by mouth daily.     No current facility-administered medications on file prior to visit.    LABS/IMAGING: No results found for this or any previous visit (from the past 48 hour(s)). No results found.  LIPID PANEL:    Component Value Date/Time   CHOL 140 07/13/2018 0216   TRIG 152 (H) 07/13/2018 0216   HDL 35 (L) 07/13/2018 0216   CHOLHDL 4.0 07/13/2018 0216   VLDL 30 07/13/2018 0216   LDLCALC 75 07/13/2018 0216     WEIGHTS: Wt Readings from Last 3 Encounters:  06/29/19 (!) 311 lb (141.1 kg)  05/30/19 (!) 316 lb (143.3 kg)  05/17/19 (!) 318 lb 5.5 oz (144.4 kg)    VITALS: BP (!) 152/92   Pulse 69   Ht 6\' 2"  (1.88 m)   Wt (!) 311 lb (141.1 kg)   SpO2 95%   BMI 39.93 kg/m   EXAM: General appearance: alert, no distress and morbidly obese Neck: no carotid bruit, no JVD and thyroid not enlarged, symmetric, no  tenderness/mass/nodules Lungs: clear to auscultation bilaterally Heart: regular rate and rhythm Abdomen: soft, non-tender; bowel sounds normal; no masses,  no organomegaly Extremities: extremities normal, atraumatic, no cyanosis or edema Pulses: 2+ and symmetric Skin: Skin color, texture, turgor normal. No rashes or lesions Neurologic: Grossly normal Psych: Pleasant  EKG: Deferred  ASSESSMENT: 1. Coronary artery disease-occluded distal circumflex and patent stents in the LAD, RCA and circumflex (06/2018), LVEF 55 to 65%  2. Hypertension 3. Dyslipidemia 4. Asthma with chronic shortness of breath 5. Second-degree Type 1 AVB (Wenkebach) -status post leadless pacemaker  PLAN: 1.   Mr. Salts is doing better now that he had pacemaker placed.  He reports his energy has improved.  Blood pressure still somewhat elevated.  He denies any anginal symptoms.  He needs to continue to work on weight loss and dietary modification.  We will plan to see him annually or sooner as necessary.  Pixie Casino, MD, [facility], [place] Director of the Advanced Lipid Disorders &  Cardiovascular Risk Reduction Clinic Diplomate of the American Board of Clinical Lipidology Attending Cardiologist  Direct Dial: [phone]  Fax: [phone]  Website:  [email or web address] 06/29/2019, 4:33 PM

## 2019-07-05 NOTE — Progress Notes (Signed)
Virtual Visit via Telephone Note  I was unable to connect with with [name] .  07/07/2019-0910-attempted patient, unable to leave voicemail on home number 07/07/2019-0912-attempted patient on mobile number, left voicemail  07/07/2019 - 0937 - attempted home phone again  07/07/2019 - 0937 - left message on mobile number again    Location: Patient: Home Provider: Office [facility] - [address], [place], [place] [address]   History of Present Illness:  74 year old male former smoker followed in our office for severe obstructive sleep apnea, COPD  PMH: Obesity, hypertension, CAD, GERD, hyperlipidemia, pacemaker Smoker/ Smoking History: Former smoker.  Quit 1985.  105-pack-year smoking history. Maintenance:  None  Pt of: Patient of Dr. Chase Caller for pulmonary, Dr. Halford Chessman for sleep  Chief complaint: 6 week follow up      Pt still noncompliant with bipap use  Not used in last 30d  Not used in over a year   Observations/Objective:   05/14/2019-SARS-CoV-2-negative  03/24/2017-CBC with differential-eosinophils relative 4.3, eosinophils absolute 0.4  05/18/2019-chest x-ray-bilateral lung base opacities eventuated by low lung volumes, this is likely atelectasis possibly with small effusions, could consider infection if they are consistent with clinical findings, no evidence of pulmonary edema  07/12/2018-CTA chest-no findings of PE, no acute pulmonary findings, streaky bibasilar atelectasis and scarring, reflux of contrast material down the IVC may be due to tricuspid regurgitation  04/15/2017-CT chest high-resolution-no evidence of ILD, few scattered subpleural nodules measures up to 7 mm in the subpleural left lower lobe, stable and therefore considered benign linear scarring in both lower lobes, aortic arthrosclerosis, severe coronary artery calcification  04/15/2017-echocardiogram-LV ejection fraction 60 to 65%-PA pressure 36  04/08/2017-pulmonary function  test-FVC 2.08 (44% predicted), postbronchodilator ratio 77, postbronchodilator FEV1 1.71 (50% predicted), no positive bronchodilator response, reversibility in FEV1 and mid flows, DLCO 23.86 (68% predicted)  PSG 04/07/14 >> AHI 97  Social History   Tobacco Use  Smoking Status Former Smoker  . Packs/day: 3.00  . Years: 35.00  . Pack years: 105.00  . Types: Cigarettes  . Quit date: 06/17/1983  . Years since quitting: 36.0  Smokeless Tobacco Never Used  Tobacco Comment   "quit smoking cigarettes in 1985   Immunization History  Administered Date(s) Administered  . Fluad Quad(high Dose 65+) 04/27/2019  . Influenza Whole 03/19/2009, 04/17/2010, 03/17/2011  . Influenza, High Dose Seasonal PF 04/05/2018  . Influenza-Unspecified 03/16/2014, 02/07/2015, 03/09/2017  . Pneumococcal Conjugate-13 05/06/2016  . Pneumococcal Polysaccharide-23 03/19/2009      Assessment and Plan:  No problem-specific Assessment & Plan notes found for this encounter.   Follow Up Instructions:  Return if symptoms worsen or fail to improve, for Follow up with Dr. Purnell Shoemaker, Follow up with Dr. Halford Chessman.   I discussed the assessment and treatment plan with the patient. The patient was provided an opportunity to ask questions and all were answered. The patient agreed with the plan and demonstrated an understanding of the instructions.   The patient was advised to call back or seek an in-person evaluation if the symptoms worsen or if the condition fails to improve as anticipated.  I provided 0 minutes of non-face-to-face time during this encounter.   Lauraine Rinne, NP

## 2019-07-07 ENCOUNTER — Encounter: Payer: Medicare HMO | Admitting: Nurse Practitioner

## 2019-07-07 ENCOUNTER — Encounter: Payer: Self-pay | Admitting: Pulmonary Disease

## 2019-07-07 ENCOUNTER — Other Ambulatory Visit: Payer: Self-pay

## 2019-07-07 ENCOUNTER — Ambulatory Visit (INDEPENDENT_AMBULATORY_CARE_PROVIDER_SITE_OTHER): Payer: Medicare HMO | Admitting: Pulmonary Disease

## 2019-07-07 ENCOUNTER — Ambulatory Visit: Payer: Medicare HMO | Admitting: Pulmonary Disease

## 2019-07-07 DIAGNOSIS — G4733 Obstructive sleep apnea (adult) (pediatric): Secondary | ICD-10-CM

## 2019-07-07 NOTE — Patient Instructions (Signed)
Please contact our office at your earliest convenience and reschedule a follow-up  We attempted to reach you for separate times this morning for your scheduled televisit  No follow-ups on file.   Wyn Quaker, FNP

## 2019-07-13 DIAGNOSIS — K5900 Constipation, unspecified: Secondary | ICD-10-CM | POA: Diagnosis not present

## 2019-07-13 DIAGNOSIS — I129 Hypertensive chronic kidney disease with stage 1 through stage 4 chronic kidney disease, or unspecified chronic kidney disease: Secondary | ICD-10-CM | POA: Diagnosis not present

## 2019-07-13 DIAGNOSIS — Z0000 Encounter for general adult medical examination without abnormal findings: Secondary | ICD-10-CM | POA: Diagnosis not present

## 2019-07-13 DIAGNOSIS — H543 Unqualified visual loss, both eyes: Secondary | ICD-10-CM | POA: Diagnosis not present

## 2019-07-13 DIAGNOSIS — I441 Atrioventricular block, second degree: Secondary | ICD-10-CM | POA: Diagnosis not present

## 2019-07-13 DIAGNOSIS — I2510 Atherosclerotic heart disease of native coronary artery without angina pectoris: Secondary | ICD-10-CM | POA: Diagnosis not present

## 2019-07-13 DIAGNOSIS — E7800 Pure hypercholesterolemia, unspecified: Secondary | ICD-10-CM | POA: Diagnosis not present

## 2019-07-13 DIAGNOSIS — Z1389 Encounter for screening for other disorder: Secondary | ICD-10-CM | POA: Diagnosis not present

## 2019-07-13 DIAGNOSIS — M109 Gout, unspecified: Secondary | ICD-10-CM | POA: Diagnosis not present

## 2019-07-13 DIAGNOSIS — C61 Malignant neoplasm of prostate: Secondary | ICD-10-CM | POA: Diagnosis not present

## 2019-07-13 DIAGNOSIS — K219 Gastro-esophageal reflux disease without esophagitis: Secondary | ICD-10-CM | POA: Diagnosis not present

## 2019-07-19 DIAGNOSIS — C61 Malignant neoplasm of prostate: Secondary | ICD-10-CM | POA: Diagnosis not present

## 2019-07-24 NOTE — Progress Notes (Deleted)
Cardiology Office Note Date:  07/24/2019  Patient ID:  [name], [name] 12/17/1945, MRN [id number] PCP:  Maury Dus, MD  Cardiologist:  Dr. Debara Pickett Electrophysiologist: Dr. Rayann Heman  ***refresh   Chief Complaint: *** 6 week device visit  History of Present Illness: [name] is a 74 y.o. male with history of CAD (jan 2020 cath with occl distal cx), HTN, HLD, Obesity, OSA on HS oxygen, Mobitz I, symptomatic bradycardia w/PPM.  He comes in today to be seen for Dr. Rayann Heman. Last seen by him post pacer implant.  He had a telehealth visit with the device clinic 05/31/2019 0% AV conduction. 79.7% AM-VP, 16.6% VP only. 0.2% AM-VS, 3.5% VS only. Discussed with [employer] representative--may be some opportunity to increase AM-VP % with in-office interrogation Planned to come in for in in-clinic device interrogation.  *** symptoms *** meds *** labs, lipids... *** CPAP *** chronic edema   Device information MDT Micra AV, implanted 05/17/2019  Past Medical History:  Diagnosis Date  . Arthritis    "left shoulder" (02/09/2015)  . Blind left eye   . CAD (coronary artery disease)   . Closed fracture of lateral portion of right tibial plateau 11/01/2017  . Complication of anesthesia    "they give me too much anesthesia & had to put me on life support for a little bit w/gallbladder OR"  . Coronary artery disease   . Former smoker   . GERD (gastroesophageal reflux disease)   . Gout   . History of bleeding ulcers   . History of gout   . History of peptic ulcer disease   . Hyperlipidemia   . Hypertension   . Hypertensive heart disease without CHF   . Kidney stones   . Lumbar disc disease   . Myocardial infarction [facility]) 1995; 2007  . Obesity   . OSA (obstructive sleep apnea)   . Pneumonia X 2  . Prostate cancer ([place])   . Pulmonary nodule   . Second degree Mobitz I AV block     Past Surgical History:  Procedure Laterality Date  . APPENDECTOMY    . APPENDECTOMY  ~ 1985  .  BACK SURGERY    . CARDIAC CATHETERIZATION N/A 02/08/2015   Procedure: Left Heart Cath and Coronary Angiography;  Surgeon: Leonie Man, MD;  Location: [place] [facility];  Service: Cardiovascular;  Laterality: N/A;  . CARDIAC CATHETERIZATION N/A 02/08/2015   Procedure: Coronary Stent Intervention;  Surgeon: Leonie Man, MD;  Location: [place] [facility];  Service: Cardiovascular;  Laterality: N/A;  . CHOLECYSTECTOMY    . CORONARY ANGIOPLASTY WITH STENT PLACEMENT  ~ 1995; 2007   "1 stent; 2 stents"  . LAPAROSCOPIC CHOLECYSTECTOMY    . LEFT HEART CATH AND CORONARY ANGIOGRAPHY N/A 07/13/2018   Procedure: LEFT HEART CATH AND CORONARY ANGIOGRAPHY;  Surgeon: Martinique, Peter M, MD;  Location: [place] [facility];  Service: Cardiovascular;  Laterality: N/A;  . LITHOTRIPSY  X 1  . LUMBAR LAMINECTOMY    . ORIF TIBIA PLATEAU Right 11/05/2017   Procedure: OPEN REDUCTION INTERNAL FIXATION (ORIF) TIBIAL PLATEAU;  Surgeon: Shona Needles, MD;  Location: [place];  Service: Orthopedics;  Laterality: Right;  . PACEMAKER LEADLESS INSERTION N/A 05/17/2019   Procedure: PACEMAKER LEADLESS INSERTION;  Surgeon: Thompson Grayer, MD;  Location: [place] [facility];  Service: Cardiovascular;  Laterality: N/A;  . POSTERIOR LUMBAR FUSION  2003?  . PROSTATE BIOPSY  2015    Current Outpatient Medications  Medication Sig  Dispense Refill  . acetaminophen (TYLENOL) 500 MG tablet Take 500 mg by mouth every 6 (six) hours as needed for moderate pain.    Marland Kitchen albuterol (VENTOLIN HFA) 108 (90 Base) MCG/ACT inhaler Inhale 1-2 puffs into the lungs every 6 (six) hours as needed for wheezing or shortness of breath.    . allopurinol (ZYLOPRIM) 300 MG tablet Take 300 mg by mouth Daily.     Marland Kitchen amLODipine (NORVASC) 10 MG tablet Take 10 mg by mouth Daily.     Marland Kitchen aspirin EC 81 MG EC tablet Take 1 tablet (81 mg total) by mouth daily.    Marland Kitchen atorvastatin (LIPITOR) 20 MG tablet Take 20 mg by mouth once a week.     . Calcium 600-200 MG-UNIT tablet  Take 1 tablet by mouth daily.    . Cholecalciferol (DIALYVITE VITAMIN D 5000) 125 MCG (5000 UT) capsule Take 5,000 Units by mouth 3 (three) times a week.     . furosemide (LASIX) 20 MG tablet Take 1 tablet (20 mg total) by mouth daily. 30 tablet 6  . hydrochlorothiazide (HYDRODIURIL) 25 MG tablet Take 25 mg by mouth daily.    Marland Kitchen losartan (COZAAR) 100 MG tablet Take 100 mg by mouth daily.    . pantoprazole (PROTONIX) 40 MG tablet Take 40 mg by mouth daily.     No current facility-administered medications for this visit.    Allergies:   Prednisone   Social History:  The patient  reports that he quit smoking about 36 years ago. His smoking use included cigarettes. He has a 105.00 pack-year smoking history. He has never used smokeless tobacco. He reports current alcohol use. He reports that he does not use drugs.   Family History:  The patient's family history includes Aneurysm in his father; COPD in his sister; Coronary artery disease in his mother; Hypertension in his father and mother; Sleep apnea in an other family member.  ROS:  Please see the history of present illness.   All other systems are reviewed and otherwise negative.   PHYSICAL EXAM: *** VS:  There were no vitals taken for this visit. BMI: There is no height or weight on file to calculate BMI. Well nourished, well developed, in no acute distress  HEENT: normocephalic, atraumatic  Neck: no JVD, carotid bruits or masses Cardiac: *** RRR; no significant murmurs, no rubs, or gallops Lungs:  *** CTA b/l, no wheezing, rhonchi or rales  Abd: soft, nontender MS: no deformity or *** atrophy Ext: *** no edema  Skin: warm and dry, no rash Neuro:  No gross deficits appreciated Psych: euthymic mood, full affect    EKG:  Not done today  PPM interrogation done today with industry support and reviewed by myself: ***   07/13/2018: LHC  Previously placed Mid RCA stent (unknown type) is widely patent.  Dist RCA lesion is 30%  stenosed.  Prox LAD lesion is 35% stenosed.  Previously placed Prox Cx stent (unknown type) is widely patent.  Previously placed Mid Cx stent (unknown type) is widely patent.  Dist Cx lesion is 100% stenosed with 100% stenosed side branch in Ost 3rd Mrg.  The left ventricular systolic function is normal.  LV end diastolic pressure is mildly elevated.  The left ventricular ejection fraction is 55-65% by visual estimate.   1. Single vessel occlusive CAD involving the distal LCx. 2. Patent stents in the LAD, RCA and LCx 3. Normal LV function 4. Mildly elevated LVEDP   04/15/2017: TTE Study Conclusions  -  Left ventricle: The cavity size was normal. There was moderate  concentric hypertrophy. Systolic function was normal. The  estimated ejection fraction was in the range of 60% to 65%. Wall  motion was normal; there were no regional wall motion  abnormalities.  - Aortic valve: Transvalvular velocity was within the normal range.  There was no stenosis. There was no regurgitation.  - Mitral valve: Transvalvular velocity was within the normal range.  There was no evidence for stenosis. There was trivial  regurgitation.  - Left atrium: The atrium was moderately dilated.  - Right ventricle: The cavity size was normal. Wall thickness was  normal. Systolic function was normal.  - Tricuspid valve: There was mild regurgitation.  - Pulmonary arteries: Systolic pressure was within the normal  range. PA peak pressure: 36 mm Hg (S).  - Pericardium, extracardiac: A trivial pericardial effusion was  identified.   Impressions:  - Endocardial border definition is poor, but systolic function  appears to be normal.   Recent Labs: 04/19/2019: B Natriuretic Peptide 78.0; Hemoglobin 15.8; Platelets 203 05/18/2019: BUN 14; Creatinine, Ser 1.23; Potassium 4.4; Sodium 142  No results found for requested labs within last 8760 hours.   CrCl cannot be calculated (Patient's most  recent lab result is older than the maximum 21 days allowed.).   Wt Readings from Last 3 Encounters:  06/29/19 (!) 311 lb (141.1 kg)  05/30/19 (!) 316 lb (143.3 kg)  05/17/19 (!) 318 lb 5.5 oz (144.4 kg)     Other studies reviewed: Additional studies/records reviewed today include: summarized above  ASSESSMENT AND PLAN:  1. PPM     ***  2. CAD     ***  3. HTN     ***  Disposition: F/u with ***  Current medicines are reviewed at length with the patient today.  The patient did not have any concerns regarding medicines.***  Signed, Tommye Standard, PA-C 07/24/2019 5:59 PM     [place] [address] [phone] (office)  [phone] (fax)

## 2019-07-25 DIAGNOSIS — C61 Malignant neoplasm of prostate: Secondary | ICD-10-CM | POA: Diagnosis not present

## 2019-07-25 DIAGNOSIS — N402 Nodular prostate without lower urinary tract symptoms: Secondary | ICD-10-CM | POA: Diagnosis not present

## 2019-07-26 ENCOUNTER — Encounter: Payer: Medicare HMO | Admitting: Physician Assistant

## 2019-08-09 ENCOUNTER — Encounter: Payer: Medicare HMO | Admitting: Physician Assistant

## 2019-08-09 DIAGNOSIS — N1831 Chronic kidney disease, stage 3a: Secondary | ICD-10-CM | POA: Diagnosis not present

## 2019-08-09 DIAGNOSIS — M546 Pain in thoracic spine: Secondary | ICD-10-CM | POA: Diagnosis not present

## 2019-08-12 IMAGING — CR DG KNEE COMPLETE 4+V*R*
4 series · 4 of 4 positions shown · non-contrast
Comparison: Right knee x-rays dated November 19, 2016.

CLINICAL DATA: Right knee pain after fall.

EXAM:
RIGHT KNEE - COMPLETE 4+ VIEW

[t knee ap right]
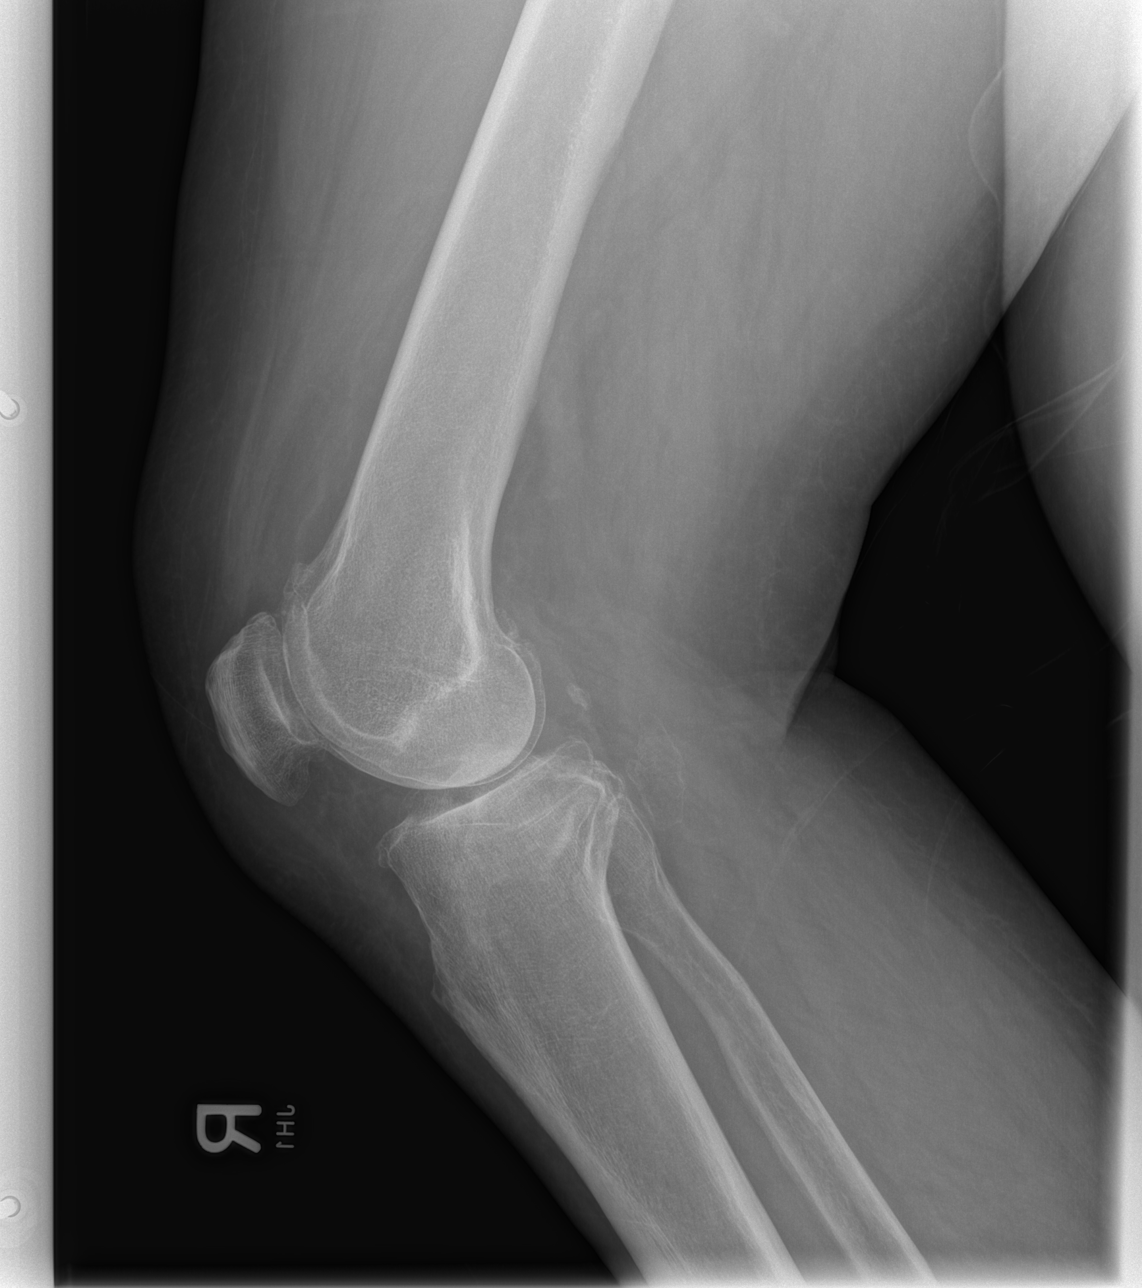

[x knee ap right (1 of 3)]
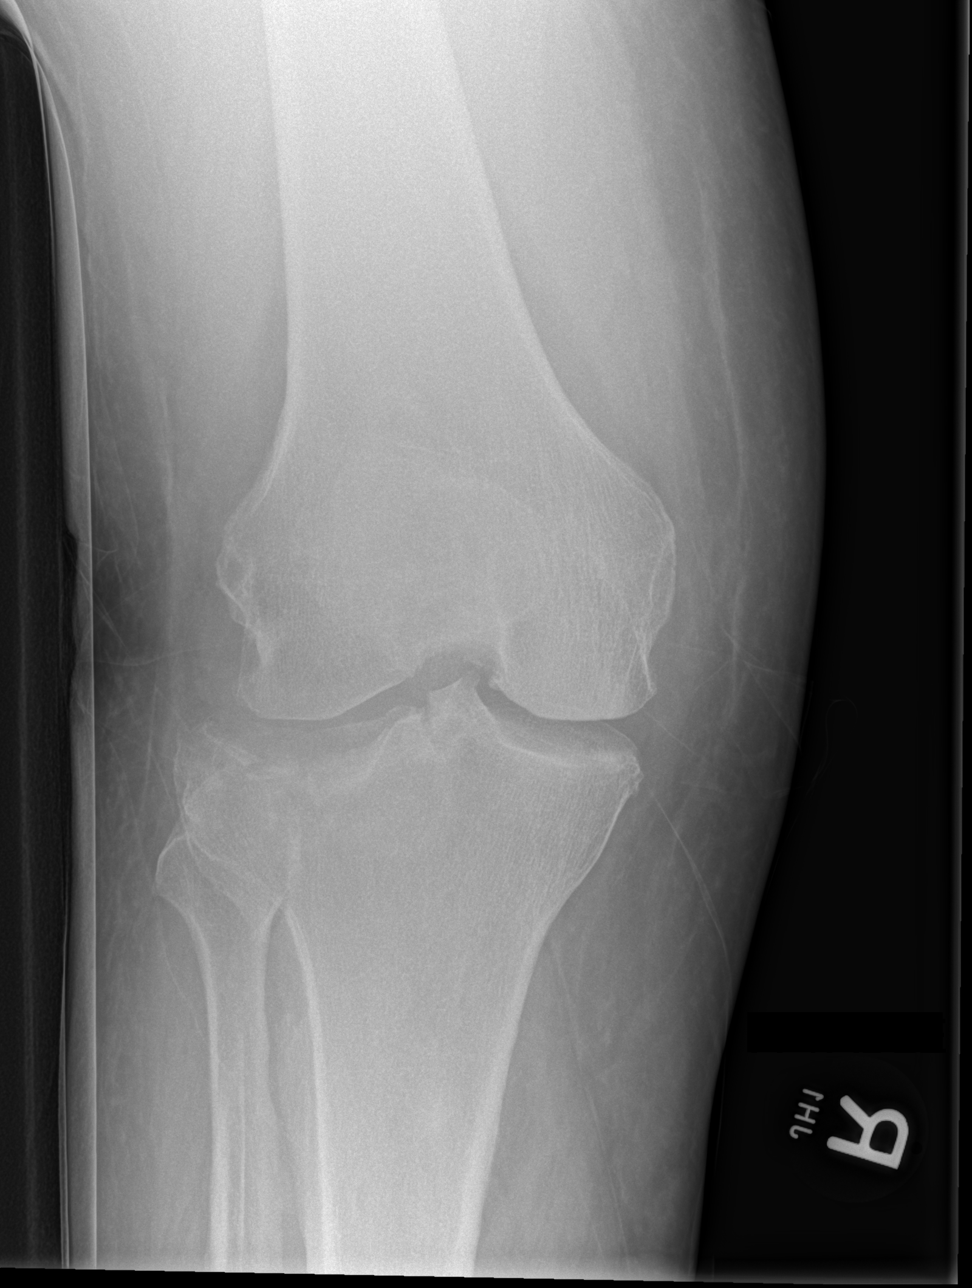

[x knee ap right (2 of 3)]
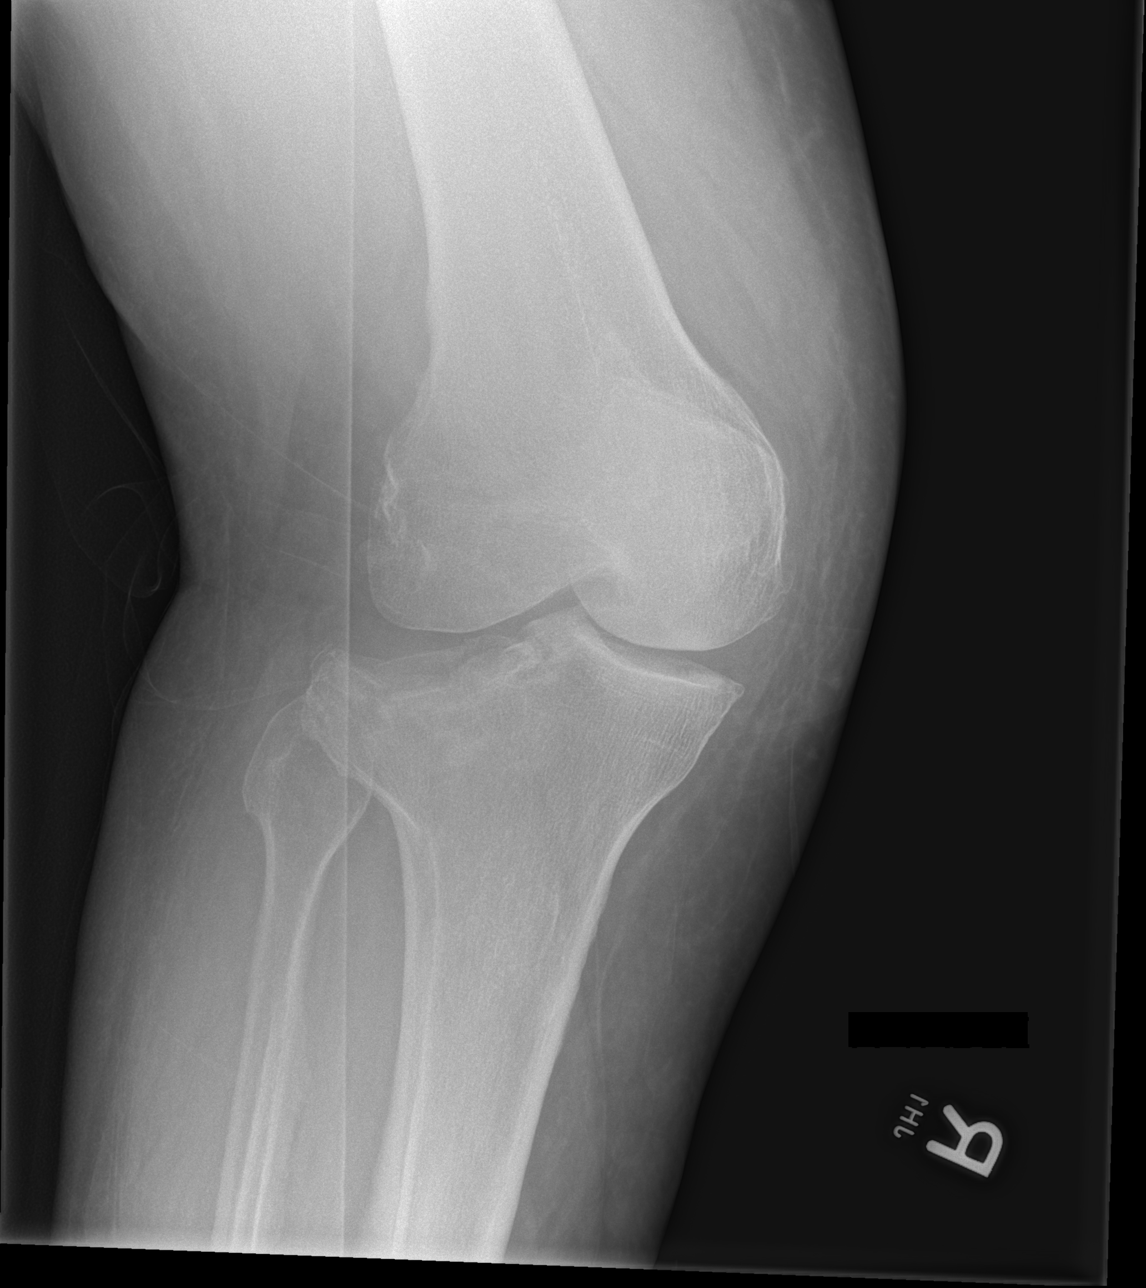

[x knee ap right (3 of 3)]
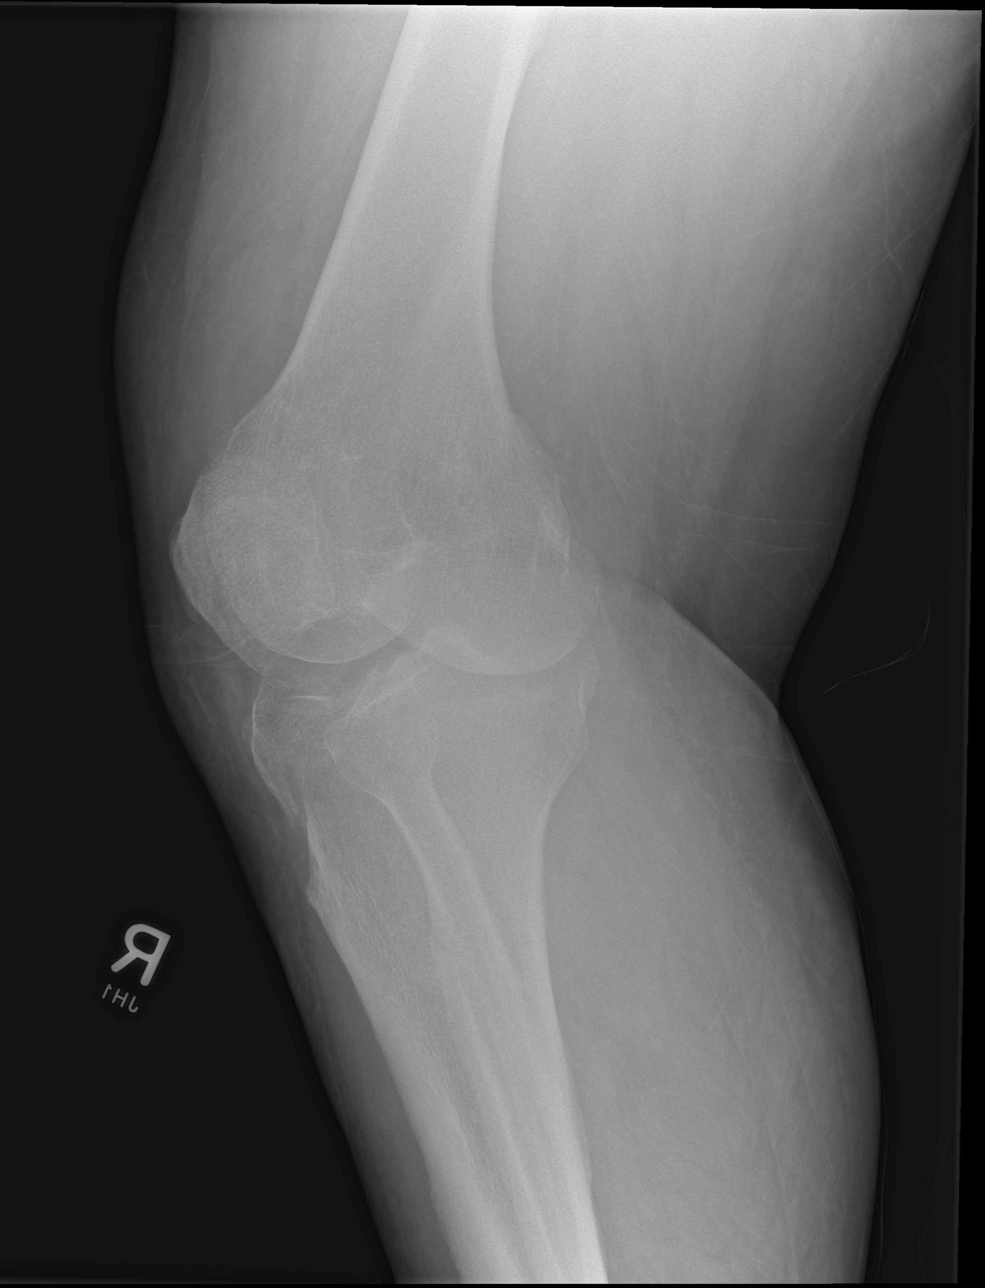

[4 of 4 positions shown; findings below may reference images not displayed]

FINDINGS: Acute, mildly depressed fracture of the lateral tibial plateau. No
large joint effusion. Mild tricompartmental osteoarthritis.
Unchanged large 3.4 cm calcified loose body likely within a Baker's
cyst. Osteopenia. Soft tissues are unremarkable.
IMPRESSION: 1. Acute, mildly depressed fracture of the lateral tibial plateau.

## 2019-08-12 IMAGING — DX DG CHEST 1V PORT
1 series · 1 of 1 positions shown · non-contrast
Comparison: 03/24/2017 and prior radiograph

CLINICAL DATA: Near syncope.

EXAM:
PORTABLE CHEST 1 VIEW

[chest ap]
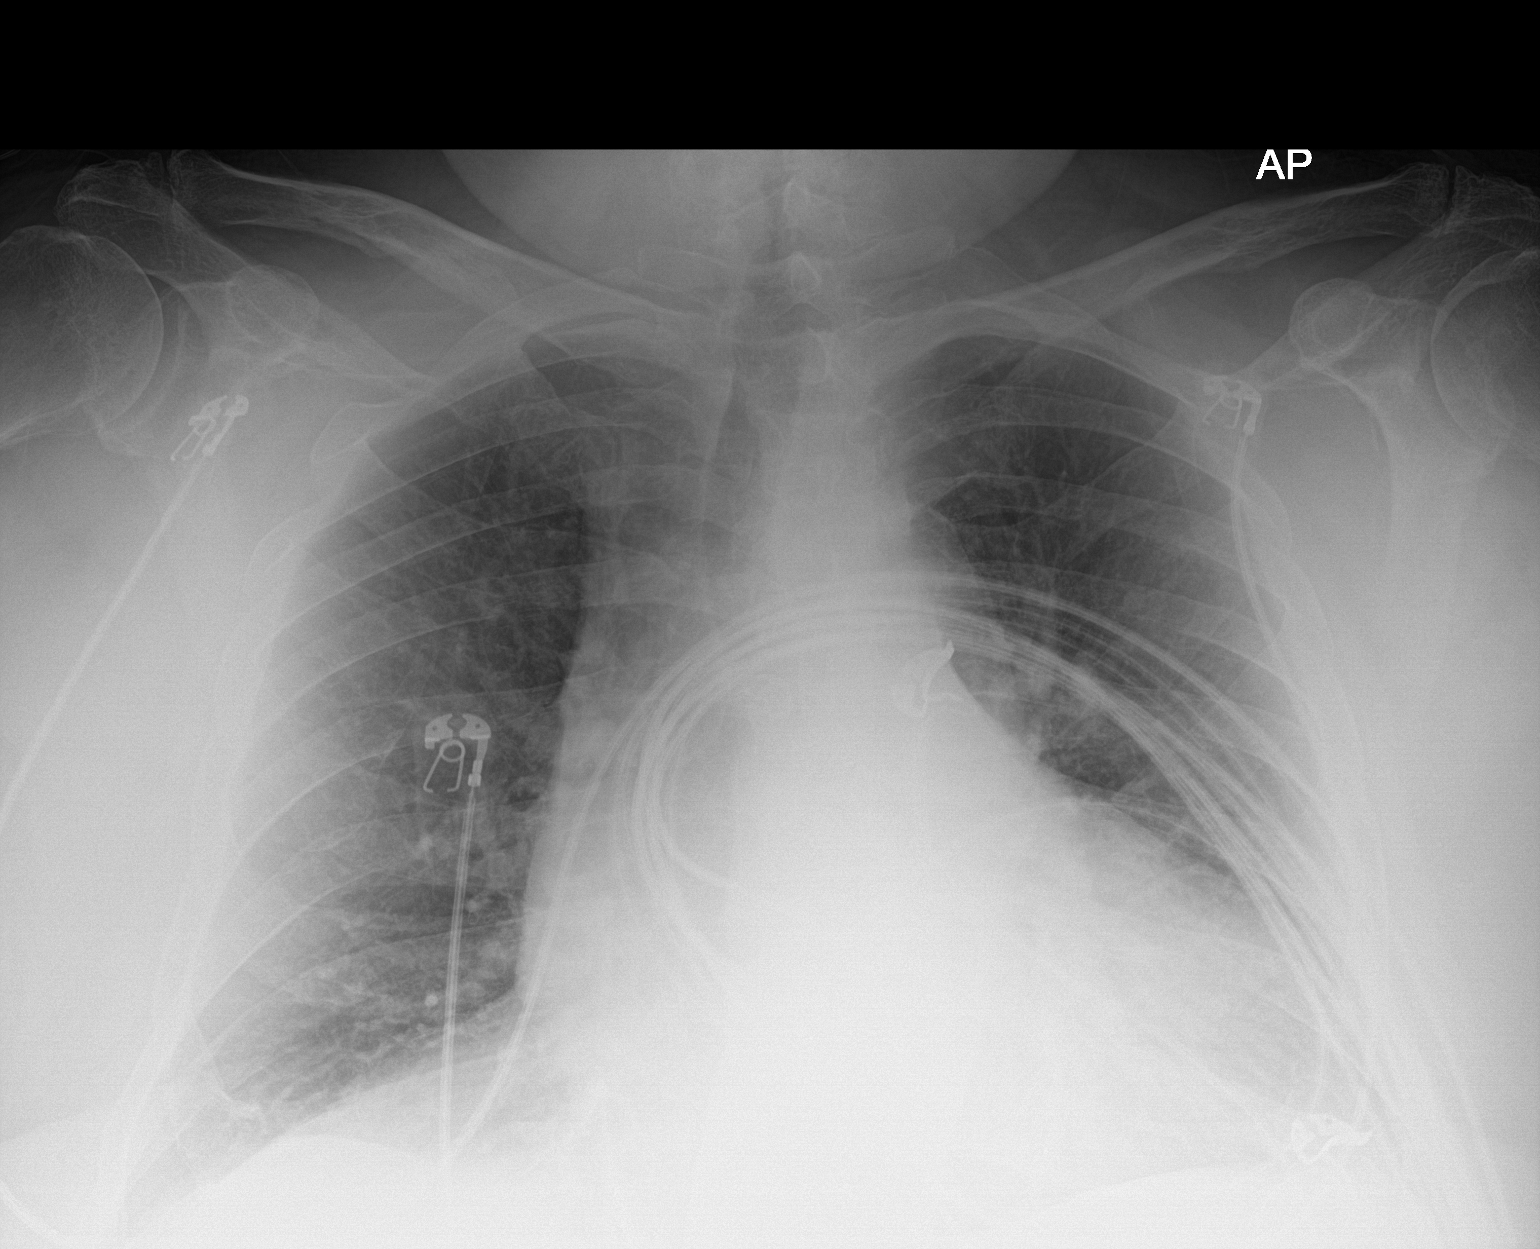

[1 of 1 positions shown; findings below may reference images not displayed]

FINDINGS: Cardiomegaly noted.

There is no evidence of focal airspace disease, pulmonary edema,
suspicious pulmonary nodule/mass, pleural effusion, or pneumothorax.

No acute bony abnormalities are identified.
IMPRESSION: Cardiomegaly without evidence of acute cardiopulmonary disease.

## 2019-08-17 ENCOUNTER — Ambulatory Visit (INDEPENDENT_AMBULATORY_CARE_PROVIDER_SITE_OTHER): Payer: Medicare HMO | Admitting: *Deleted

## 2019-08-17 DIAGNOSIS — I441 Atrioventricular block, second degree: Secondary | ICD-10-CM | POA: Diagnosis not present

## 2019-08-17 LAB — CUP PACEART REMOTE DEVICE CHECK
Battery Remaining Longevity: 96 mo
Battery Voltage: 3.1 V
Brady Statistic AS VP Percent: 79.11 %
Brady Statistic AS VS Percent: 0.08 %
Brady Statistic RV Percent Paced: 97.31 %
Date Time Interrogation Session: 20210302071800
Implantable Pulse Generator Implant Date: 20201201
Lead Channel Impedance Value: 630 Ohm
Lead Channel Pacing Threshold Amplitude: 0.375 V
Lead Channel Pacing Threshold Pulse Width: 0.24 ms
Lead Channel Sensing Intrinsic Amplitude: 16.425 mV
Lead Channel Setting Pacing Amplitude: 1.875
Lead Channel Setting Pacing Pulse Width: 0.24 ms
Lead Channel Setting Sensing Sensitivity: 2 mV

## 2019-08-17 NOTE — Progress Notes (Signed)
PPM Remote  

## 2019-08-18 ENCOUNTER — Telehealth: Payer: Self-pay

## 2019-08-22 ENCOUNTER — Telehealth (INDEPENDENT_AMBULATORY_CARE_PROVIDER_SITE_OTHER): Payer: Medicare HMO | Admitting: Internal Medicine

## 2019-08-22 ENCOUNTER — Other Ambulatory Visit: Payer: Self-pay

## 2019-08-22 VITALS — Ht 74.0 in | Wt 300.0 lb

## 2019-08-22 DIAGNOSIS — I10 Essential (primary) hypertension: Secondary | ICD-10-CM

## 2019-08-22 DIAGNOSIS — I441 Atrioventricular block, second degree: Secondary | ICD-10-CM

## 2019-08-22 NOTE — Progress Notes (Signed)
Electrophysiology TeleHealth Note  Due to national recommendations of social distancing due to [place] 19, an audio telehealth visit is felt to be most appropriate for this patient at this time.  Verbal consent was obtained by me for the telehealth visit today.  The patient does not have capability for a virtual visit.  A phone visit is therefore required today.   Date:  08/22/2019   ID:  [name], DOB 1945-09-01, MRN [id number]  Location: patient's home  Provider location:  [place]  Evaluation Performed: Follow-up visit  PCP:  Maury Dus, MD   Electrophysiologist:  Dr Rayann Heman  Chief Complaint:  palpitations  History of Present Illness:    [name] is a 74 y.o. male who presents via telehealth conferencing today.  Since leadless pacemaker implant, the patient reports doing reasonably well. His priamry concern is with back pain.  He thinks he "pulled a muscle" about 2 weeks ago.  He has very little energy and is not very active. Today, he denies symptoms of palpitations, chest pain, shortness of breath,  lower extremity edema, dizziness, presyncope, or syncope.  The patient is otherwise without complaint today.     Past Medical History:  Diagnosis Date  . Arthritis    "left shoulder" (02/09/2015)  . Blind left eye   . CAD (coronary artery disease)   . Closed fracture of lateral portion of right tibial plateau 11/01/2017  . Complication of anesthesia    "they give me too much anesthesia & had to put me on life support for a little bit w/gallbladder OR"  . Coronary artery disease   . Former smoker   . GERD (gastroesophageal reflux disease)   . Gout   . History of bleeding ulcers   . History of gout   . History of peptic ulcer disease   . Hyperlipidemia   . Hypertension   . Hypertensive heart disease without CHF   . Kidney stones   . Lumbar disc disease   . Myocardial infarction [facility]) 1995; 2007  . Obesity   . OSA (obstructive sleep apnea)   .  Pneumonia X 2  . Prostate cancer ([place])   . Pulmonary nodule   . Second degree Mobitz I AV block     Past Surgical History:  Procedure Laterality Date  . APPENDECTOMY    . APPENDECTOMY  ~ 1985  . BACK SURGERY    . CARDIAC CATHETERIZATION N/A 02/08/2015   Procedure: Left Heart Cath and Coronary Angiography;  Surgeon: Leonie Man, MD;  Location: [place] [facility];  Service: Cardiovascular;  Laterality: N/A;  . CARDIAC CATHETERIZATION N/A 02/08/2015   Procedure: Coronary Stent Intervention;  Surgeon: Leonie Man, MD;  Location: [place] [facility];  Service: Cardiovascular;  Laterality: N/A;  . CHOLECYSTECTOMY    . CORONARY ANGIOPLASTY WITH STENT PLACEMENT  ~ 1995; 2007   "1 stent; 2 stents"  . LAPAROSCOPIC CHOLECYSTECTOMY    . LEFT HEART CATH AND CORONARY ANGIOGRAPHY N/A 07/13/2018   Procedure: LEFT HEART CATH AND CORONARY ANGIOGRAPHY;  Surgeon: Martinique, Peter M, MD;  Location: [place] [facility];  Service: Cardiovascular;  Laterality: N/A;  . LITHOTRIPSY  X 1  . LUMBAR LAMINECTOMY    . ORIF TIBIA PLATEAU Right 11/05/2017   Procedure: OPEN REDUCTION INTERNAL FIXATION (ORIF) TIBIAL PLATEAU;  Surgeon: Shona Needles, MD;  Location: [place];  Service: Orthopedics;  Laterality: Right;  . PACEMAKER LEADLESS INSERTION N/A 05/17/2019   Procedure: PACEMAKER LEADLESS  INSERTION;  Surgeon: Thompson Grayer, MD;  Location: [place] [facility];  Service: Cardiovascular;  Laterality: N/A;  . POSTERIOR LUMBAR FUSION  2003?  . PROSTATE BIOPSY  2015    Current Outpatient Medications  Medication Sig Dispense Refill  . acetaminophen (TYLENOL) 500 MG tablet Take 500 mg by mouth every 6 (six) hours as needed for moderate pain.    Marland Kitchen albuterol (VENTOLIN HFA) 108 (90 Base) MCG/ACT inhaler Inhale 1-2 puffs into the lungs every 6 (six) hours as needed for wheezing or shortness of breath.    . allopurinol (ZYLOPRIM) 300 MG tablet Take 300 mg by mouth Daily.     Marland Kitchen amLODipine (NORVASC) 10 MG tablet Take 10 mg by  mouth Daily.     Marland Kitchen aspirin EC 81 MG EC tablet Take 1 tablet (81 mg total) by mouth daily.    Marland Kitchen atorvastatin (LIPITOR) 20 MG tablet Take 20 mg by mouth once a week.     . Calcium 600-200 MG-UNIT tablet Take 1 tablet by mouth daily.    . Cholecalciferol (DIALYVITE VITAMIN D 5000) 125 MCG (5000 UT) capsule Take 5,000 Units by mouth 3 (three) times a week.     . furosemide (LASIX) 20 MG tablet Take 1 tablet (20 mg total) by mouth daily. 30 tablet 6  . hydrochlorothiazide (HYDRODIURIL) 25 MG tablet Take 25 mg by mouth daily.    Marland Kitchen losartan (COZAAR) 100 MG tablet Take 100 mg by mouth daily.    . pantoprazole (PROTONIX) 40 MG tablet Take 40 mg by mouth daily.     No current facility-administered medications for this visit.    Allergies:   Prednisone   Social History:  The patient  reports that he quit smoking about 36 years ago. His smoking use included cigarettes. He has a 105.00 pack-year smoking history. He has never used smokeless tobacco. He reports current alcohol use. He reports that he does not use drugs.   Family History:  The patient's family history includes Aneurysm in his father; COPD in his sister; Coronary artery disease in his mother; Hypertension in his father and mother; Sleep apnea in an other family member.   ROS:  Please see the history of present illness.   All other systems are personally reviewed and negative.    Exam:    Vital Signs:  Ht 6\' 2"  (1.88 m)   Wt 300 lb (136.1 kg)   BMI 38.52 kg/m   Well sounding, alert and conversant   Labs/Other Tests and Data Reviewed:    Recent Labs: 04/19/2019: B Natriuretic Peptide 78.0; Hemoglobin 15.8; Platelets 203 05/18/2019: BUN 14; Creatinine, Ser 1.23; Potassium 4.4; Sodium 142   Wt Readings from Last 3 Encounters:  08/22/19 300 lb (136.1 kg)  06/29/19 (!) 311 lb (141.1 kg)  05/30/19 (!) 316 lb (143.3 kg)     Last device remote is reviewed from [employer] PDF which reveals normal device function, no arrhythmias, 100% V  pacing,  Functionally in VVI mode frequently though histograms look good.   ASSESSMENT & PLAN:    1.  Mobitz I second degree AV block Doing reasonably well s/p Micra AV He has multiple chronic comorbidities that limit his activity. He V paces > 97% and is 18% VP only. I think we should turn automatic mode switch off to allow for better AV synchrony.  I will bring him in next week to see Chanetta Marshall for adjustment.  2. Morbid obesity He needs to lose weight in order to feel better  3. CAD No ischemic symptoms  4. HTN Stable No change required today    Patient Risk:  after full review of this patients clinical status, I feel that they are at moderate risk at this time.  Today, I have spent 15 minutes with the patient with telehealth technology discussing arrhythmia management .    Army Fossa, MD  08/22/2019 9:36 AM     [facility] [address] [place] [address] [phone] (office) [phone] (fax)

## 2019-08-24 ENCOUNTER — Encounter: Payer: Medicare HMO | Admitting: Physician Assistant

## 2019-08-31 DIAGNOSIS — N1831 Chronic kidney disease, stage 3a: Secondary | ICD-10-CM | POA: Diagnosis not present

## 2019-09-02 ENCOUNTER — Other Ambulatory Visit: Payer: Self-pay

## 2019-09-02 ENCOUNTER — Ambulatory Visit: Payer: Medicare HMO | Admitting: Nurse Practitioner

## 2019-09-02 ENCOUNTER — Encounter: Payer: Self-pay | Admitting: Nurse Practitioner

## 2019-09-02 DIAGNOSIS — I441 Atrioventricular block, second degree: Secondary | ICD-10-CM

## 2019-09-02 LAB — CUP PACEART INCLINIC DEVICE CHECK
Battery Remaining Longevity: 96 mo
Battery Voltage: 3.09 V
Brady Statistic AS VP Percent: 78.24 %
Brady Statistic AS VS Percent: 0.08 %
Brady Statistic RV Percent Paced: 97.18 %
Date Time Interrogation Session: 20210319091105
Implantable Pulse Generator Implant Date: 20201201
Lead Channel Impedance Value: 600 Ohm
Lead Channel Pacing Threshold Amplitude: 0.375 V
Lead Channel Pacing Threshold Pulse Width: 0.24 ms
Lead Channel Sensing Intrinsic Amplitude: 23.963 mV
Lead Channel Setting Pacing Amplitude: 1.875
Lead Channel Setting Pacing Pulse Width: 0.24 ms
Lead Channel Setting Sensing Sensitivity: 2 mV

## 2019-09-02 NOTE — Patient Instructions (Addendum)
Medication Instructions:  none *If you need a refill on your cardiac medications before your next appointment, please call your pharmacy*   Lab Work: none If you have labs (blood work) drawn today and your tests are completely normal, you will receive your results only by: Marland Kitchen MyChart Message (if you have MyChart) OR . A paper copy in the mail If you have any lab test that is abnormal or we need to change your treatment, we will call you to review the results.   Testing/Procedures: none   Follow-Up: At [facility], you and your health needs are our priority.  As part of our continuing mission to provide you with exceptional heart care, we have created designated Provider Care Teams.  These Care Teams include your primary Cardiologist (physician) and Advanced Practice Providers (APPs -  Physician Assistants and Nurse Practitioners) who all work together to provide you with the care you need, when you need it.  Your next appointment:   1 year(s)  The format for your next appointment:   Either In Person or Virtual  Provider:   Dr Rayann Heman   Other Instructions Remote monitoring is used to monitor your Pacemaker from home. This monitoring reduces the number of office visits required to check your device to one time per year. It allows [place] to keep an eye on the functioning of your device to ensure it is working properly. You are scheduled for a device check from home on 09/16/19. You may send your transmission at any time that day. If you have a wireless device, the transmission will be sent automatically. After your physician reviews your transmission, you will receive a postcard with your next transmission date.

## 2019-09-02 NOTE — Progress Notes (Signed)
Micra AV check in clinic.  Changes made today:  PVAB 550 ms -> 450 ms A3 Window End 760 ms -> 675 ms Min Auto A3 Window End 700 ms -> 675 ms A4 Threshold 1.3 m/s -> 0.7 m/s Min Auto A4 Threshold 0.8 m/s -> 0.7 m/s Max PVARP 600 ms -> 550 ms Tracking Check On -> Off   All sensing vectors checked today. AV conduction mode switch off.  Even though pt did not have AM/VP markers for each beat, on surface, he was 1:1 tracking.  Will check remote in 2 weeks and I will follow up with patient by phone after that to see how he is feeling.   Chanetta Marshall, NP 09/02/2019 9:14 AM

## 2019-09-05 ENCOUNTER — Encounter: Payer: Self-pay | Admitting: Primary Care

## 2019-09-05 ENCOUNTER — Other Ambulatory Visit: Payer: Self-pay

## 2019-09-05 ENCOUNTER — Ambulatory Visit: Payer: Medicare HMO | Admitting: Primary Care

## 2019-09-05 DIAGNOSIS — J45901 Unspecified asthma with (acute) exacerbation: Secondary | ICD-10-CM | POA: Diagnosis not present

## 2019-09-05 DIAGNOSIS — J9611 Chronic respiratory failure with hypoxia: Secondary | ICD-10-CM

## 2019-09-05 DIAGNOSIS — G4733 Obstructive sleep apnea (adult) (pediatric): Secondary | ICD-10-CM | POA: Diagnosis not present

## 2019-09-05 NOTE — Progress Notes (Signed)
@Patient  ID: Geralyn Flash, male    DOB: 02/18/1946, 74 y.o.   MRN: [id number]  Chief Complaint  Patient presents with  . Follow-up    Not wearing CPAP - Blows too much air - Sleeps in a recliner every night due to pain in back and legs - Feels some better since pacemaker adjustment on 08/31/19    Referring provider: Maury Dus, MD  HPI: 74 year old male, former smoker. PMH significant for OSA and COPD. Followed by Dr. Halford Chessman for sleep apnea. PSG 04/07/14 >> AHI 97. Maintained on BIPAP, poor compliance.   09/05/2019 Patient presents today for follow-up OSA  Visit. He has been having issues with BIPAP compliance for the past year. Per airview patient has not worn in > 90 days. He states that his mask doesn't fit right and the pressure is too high. Reports that he feels boated when he puts his mask on at night. He has been sleeping in a recliner chair d/t back pain. He sleeps about 3-4 hours. He reports that he had no energy. His pacemaker was interigated on Wednesday March 17th and he now feels better. He has lost 30 lbs in the last several months. He has cut out/down onbread and soda.   Allergies  Allergen Reactions  . Prednisone Swelling    Immunization History  Administered Date(s) Administered  . Fluad Quad(high Dose 65+) 04/27/2019  . Influenza Whole 03/19/2009, 04/17/2010, 03/17/2011  . Influenza, High Dose Seasonal PF 04/05/2018  . Influenza-Unspecified 03/16/2014, 02/07/2015, 03/09/2017  . Pneumococcal Conjugate-13 05/06/2016  . Pneumococcal Polysaccharide-23 03/19/2009    Past Medical History:  Diagnosis Date  . Arthritis    "left shoulder" (02/09/2015)  . Blind left eye   . CAD (coronary artery disease)   . Closed fracture of lateral portion of right tibial plateau 11/01/2017  . Complication of anesthesia    "they give me too much anesthesia & had to put me on life support for a little bit w/gallbladder OR"  . Coronary artery disease   . Former smoker   . GERD  (gastroesophageal reflux disease)   . Gout   . History of bleeding ulcers   . History of gout   . History of peptic ulcer disease   . Hyperlipidemia   . Hypertension   . Hypertensive heart disease without CHF   . Kidney stones   . Lumbar disc disease   . Myocardial infarction [facility]) 1995; 2007  . Obesity   . OSA (obstructive sleep apnea)   . Pneumonia X 2  . Prostate cancer ([place])   . Pulmonary nodule   . Second degree Mobitz I AV block     Tobacco History: Social History   Tobacco Use  Smoking Status Former Smoker  . Packs/day: 3.00  . Years: 35.00  . Pack years: 105.00  . Types: Cigarettes  . Quit date: 06/17/1983  . Years since quitting: 36.2  Smokeless Tobacco Never Used  Tobacco Comment   "quit smoking cigarettes in 1985   Counseling given: Not Answered Comment: "quit smoking cigarettes in 1985   Outpatient Medications Prior to Visit  Medication Sig Dispense Refill  . acetaminophen (TYLENOL) 500 MG tablet Take 500 mg by mouth every 6 (six) hours as needed for moderate pain.    Marland Kitchen albuterol (VENTOLIN HFA) 108 (90 Base) MCG/ACT inhaler Inhale 1-2 puffs into the lungs every 6 (six) hours as needed for wheezing or shortness of breath.    . allopurinol (ZYLOPRIM) 300 MG tablet Take  300 mg by mouth Daily.     Marland Kitchen amLODipine (NORVASC) 10 MG tablet Take 10 mg by mouth Daily.     Marland Kitchen aspirin EC 81 MG EC tablet Take 1 tablet (81 mg total) by mouth daily.    Marland Kitchen atorvastatin (LIPITOR) 20 MG tablet Take 20 mg by mouth once a week.     . Cholecalciferol (DIALYVITE VITAMIN D 5000) 125 MCG (5000 UT) capsule Take 5,000 Units by mouth 3 (three) times a week.     . furosemide (LASIX) 20 MG tablet Take 1 tablet (20 mg total) by mouth daily. 30 tablet 6  . hydrochlorothiazide (HYDRODIURIL) 25 MG tablet Take 25 mg by mouth daily.    Marland Kitchen losartan (COZAAR) 100 MG tablet Take 100 mg by mouth daily.    . ondansetron (ZOFRAN) 8 MG tablet TAKE 1 2 TO 1 (ONE HALF TO ONE) TABLET BY MOUTH EVERY 8 HOURS  AS NEEDED FOR NAUSEA    . pantoprazole (PROTONIX) 40 MG tablet Take 40 mg by mouth daily.     No facility-administered medications prior to visit.    Review of Systems  Review of Systems  Constitutional: Negative.   HENT: Positive for postnasal drip.   Respiratory: Negative for cough, chest tightness, shortness of breath and wheezing.   Cardiovascular: Positive for leg swelling.    Physical Exam  BP 130/78 (BP Location: Left Arm, Cuff Size: Large)   Pulse 98   Temp 97.6 F (36.4 C) (Oral)   Ht 6' (1.829 m)   Wt (!) 302 lb 12.8 oz (137.3 kg)   SpO2 96%   BMI 41.07 kg/m  Physical Exam Constitutional:      Appearance: Normal appearance. He is obese. He is not ill-appearing.  HENT:     Head: Normocephalic and atraumatic.     Mouth/Throat:     Mouth: Mucous membranes are moist.     Pharynx: Oropharynx is clear.     Comments: Mallampati class II Cardiovascular:     Rate and Rhythm: Normal rate and regular rhythm.  Pulmonary:     Effort: Pulmonary effort is normal.     Breath sounds: Normal breath sounds.     Comments: Minimal dyspnea with speaking Skin:    General: Skin is warm and dry.  Neurological:     General: No focal deficit present.     Mental Status: He is alert and oriented to person, place, and time. Mental status is at baseline.  Psychiatric:        Mood and Affect: Mood normal.        Behavior: Behavior normal.        Thought Content: Thought content normal.        Judgment: Judgment normal.      Lab Results:  CBC    Component Value Date/Time   WBC 9.0 04/19/2019 1149   RBC 5.25 04/19/2019 1149   HGB 15.8 04/19/2019 1149   HCT 49.2 04/19/2019 1149   PLT 203 04/19/2019 1149   MCV 93.7 04/19/2019 1149   MCH 30.1 04/19/2019 1149   MCHC 32.1 04/19/2019 1149   RDW 13.8 04/19/2019 1149   LYMPHSABS 2.5 11/01/2017 1702   MONOABS 0.8 11/01/2017 1702   EOSABS 0.2 11/01/2017 1702   BASOSABS 0.0 11/01/2017 1702    BMET    Component Value Date/Time    NA 142 05/18/2019 0234   K 4.4 05/18/2019 0234   CL 104 05/18/2019 0234   CO2 29 05/18/2019 0234   GLUCOSE 108 (  H) 05/18/2019 0234   BUN 14 05/18/2019 0234   CREATININE 1.23 05/18/2019 0234   CALCIUM 9.7 05/18/2019 0234   CALCIUM 10.3 (H) 11/05/2017 0510   GFRNONAA 57 (L) 05/18/2019 0234   GFRAA >60 05/18/2019 0234    BNP    Component Value Date/Time   BNP 78.0 04/19/2019 1149    ProBNP    Component Value Date/Time   PROBNP 190.0 (H) 03/24/2017 1634    Imaging: CUP PACEART INCLINIC DEVICE CHECK  Result Date: 09/02/2019 Micra AV check in clinic.  Changes made today: PVAB 550 ms -> 450 ms A3 Window End 760 ms -> 675 ms Min Auto A3 Window End 700 ms -> 675 ms A4 Threshold 1.3 m/s -> 0.7 m/s Min Auto A4 Threshold 0.8 m/s -> 0.7 m/s Max PVARP 600 ms -> 550 ms Tracking Check On -> Off All sensing vectors checked today. AV conduction mode switch off.  Even though, pt did not have AM/VP markers for each beat, on surface, he was 1:1 tracking.  Will check remote in 2 weeks and I will follow up with patient by phone after that to see how he is feeling.  CUP PACEART REMOTE DEVICE CHECK  Result Date: 08/17/2019 Scheduled remote reviewed.  79% AM-VP however presenting appears functioning as VVI. Occasional AM sensed but without surface can't ruleout undersensing A4 or more likely AF. All testing stable. No history of PAF and no OAC. Routing to Triage.   Next remote 91 days.    Assessment & Plan:   OSA (obstructive sleep apnea) - Non-compliant with BIPAP, having issues with mask fit and pressure setting - Patient has lost 30 lbs in 2-3 months by cutting out/down bread and soda  - Needs in-sleep lab BIAP titration study - FU in 3 months with Dr. Halford Chessman   Asthma, chronic, unspecified asthma severity, with acute exacerbation - Stable; denies acute shortness of breath or wheezing - Do not believe he is still on Breo  - Has not needed ALBUTEROL rescue inhaler   Chronic  respiratory failure with hypoxia ([place]) - Stable; he has oxygen at home but not using  - O2 96% on room air    Martyn Ehrich, NP 09/05/2019

## 2019-09-05 NOTE — Assessment & Plan Note (Signed)
-   Non-compliant with BIPAP, having issues with mask fit and pressure setting - Patient has lost 30 lbs in 2-3 months by cutting out/down bread and soda  - Needs in-sleep lab BIAP titration study - FU in 3 months with Dr. Halford Chessman

## 2019-09-05 NOTE — Assessment & Plan Note (Signed)
-   Stable; denies acute shortness of breath or wheezing - Do not believe he is still on Breo  - Has not needed ALBUTEROL rescue inhaler

## 2019-09-05 NOTE — Progress Notes (Signed)
Reviewed and agree with assessment/plan.   Rekita Miotke, MD [facility] 06/11/2016, 12:24 PM Pager:  [phone]  

## 2019-09-05 NOTE — Assessment & Plan Note (Signed)
-   Stable; he has oxygen at home but not using  - O2 96% on room air

## 2019-09-05 NOTE — Patient Instructions (Signed)
Orders: Sleep titration study re: OSA   Follow-up: 3 months with Dr. Halford Chessman (only please)   CPAP and BPAP Information CPAP and BPAP are methods of helping a person breathe with the use of air pressure. CPAP stands for "continuous positive airway pressure." BPAP stands for "bi-level positive airway pressure." In both methods, air is blown through your nose or mouth and into your air passages to help you breathe well. CPAP and BPAP use different amounts of pressure to blow air. With CPAP, the amount of pressure stays the same while you breathe in and out. With BPAP, the amount of pressure is increased when you breathe in (inhale) so that you can take larger breaths. Your health care provider will recommend whether CPAP or BPAP would be more helpful for you. Why are CPAP and BPAP treatments used? CPAP or BPAP can be helpful if you have:  Sleep apnea.  Chronic obstructive pulmonary disease (COPD).  Heart failure.  Medical conditions that weaken the muscles of the chest including muscular dystrophy, or neurological diseases such as amyotrophic lateral sclerosis (ALS).  Other problems that cause breathing to be weak, abnormal, or difficult. CPAP is most commonly used for obstructive sleep apnea (OSA) to keep the airways from collapsing when the muscles relax during sleep. How is CPAP or BPAP administered? Both CPAP and BPAP are provided by a small machine with a flexible plastic tube that attaches to a plastic mask. You wear the mask. Air is blown through the mask into your nose or mouth. The amount of pressure that is used to blow the air can be adjusted on the machine. Your health care provider will determine the pressure setting that should be used based on your individual needs. When should CPAP or BPAP be used? In most cases, the mask only needs to be worn during sleep. Generally, the mask needs to be worn throughout the night and during any daytime naps. People with certain medical conditions  may also need to wear the mask at other times when they are awake. Follow instructions from your health care provider about when to use the machine. What are some tips for using the mask?   Because the mask needs to be snug, some people feel trapped or closed-in (claustrophobic) when first using the mask. If you feel this way, you may need to get used to the mask. One way to do this is by holding the mask loosely over your nose or mouth and then gradually applying the mask more snugly. You can also gradually increase the amount of time that you use the mask.  Masks are available in various types and sizes. Some fit over your mouth and nose while others fit over just your nose. If your mask does not fit well, talk with your health care provider about getting a different one.  If you are using a mask that fits over your nose and you tend to breathe through your mouth, a chin strap may be applied to help keep your mouth closed.  The CPAP and BPAP machines have alarms that may sound if the mask comes off or develops a leak.  If you have trouble with the mask, it is very important that you talk with your health care provider about finding a way to make the mask easier to tolerate. Do not stop using the mask. Stopping the use of the mask could have a negative impact on your health. What are some tips for using the machine?  Place your CPAP  or BPAP machine on a secure table or stand near an electrical outlet.  Know where the on/off switch is located on the machine.  Follow instructions from your health care provider about how to set the pressure on your machine and when you should use it.  Do not eat or drink while the CPAP or BPAP machine is on. Food or fluids could get pushed into your lungs by the pressure of the CPAP or BPAP.  Do not smoke. Tobacco smoke residue can damage the machine.  For home use, CPAP and BPAP machines can be rented or purchased through home health care companies. Many  different brands of machines are available. Renting a machine before purchasing may help you find out which particular machine works well for you.  Keep the CPAP or BPAP machine and attachments clean. Ask your health care provider for specific instructions. Get help right away if:  You have redness or open areas around your nose or mouth where the mask fits.  You have trouble using the CPAP or BPAP machine.  You cannot tolerate wearing the CPAP or BPAP mask.  You have pain, discomfort, and bloating in your abdomen. Summary  CPAP and BPAP are methods of helping a person breathe with the use of air pressure.  Both CPAP and BPAP are provided by a small machine with a flexible plastic tube that attaches to a plastic mask.  If you have trouble with the mask, it is very important that you talk with your health care provider about finding a way to make the mask easier to tolerate. This information is not intended to replace advice given to you by your health care provider. Make sure you discuss any questions you have with your health care provider. Document Revised: 09/22/2018 Document Reviewed: 04/21/2016 Elsevier Patient Education  [place].

## 2019-09-05 NOTE — Addendum Note (Signed)
Addended by: Doroteo Glassman D on: 09/05/2019 09:39 AM   Modules accepted: Orders

## 2019-09-07 ENCOUNTER — Other Ambulatory Visit (HOSPITAL_BASED_OUTPATIENT_CLINIC_OR_DEPARTMENT_OTHER): Payer: Self-pay

## 2019-09-14 DIAGNOSIS — E7800 Pure hypercholesterolemia, unspecified: Secondary | ICD-10-CM | POA: Diagnosis not present

## 2019-09-14 DIAGNOSIS — I10 Essential (primary) hypertension: Secondary | ICD-10-CM | POA: Diagnosis not present

## 2019-09-14 DIAGNOSIS — N183 Chronic kidney disease, stage 3 unspecified: Secondary | ICD-10-CM | POA: Diagnosis not present

## 2019-09-14 DIAGNOSIS — C61 Malignant neoplasm of prostate: Secondary | ICD-10-CM | POA: Diagnosis not present

## 2019-09-14 DIAGNOSIS — I2510 Atherosclerotic heart disease of native coronary artery without angina pectoris: Secondary | ICD-10-CM | POA: Diagnosis not present

## 2019-09-14 DIAGNOSIS — N1831 Chronic kidney disease, stage 3a: Secondary | ICD-10-CM | POA: Diagnosis not present

## 2019-09-14 DIAGNOSIS — I129 Hypertensive chronic kidney disease with stage 1 through stage 4 chronic kidney disease, or unspecified chronic kidney disease: Secondary | ICD-10-CM | POA: Diagnosis not present

## 2019-09-14 DIAGNOSIS — I252 Old myocardial infarction: Secondary | ICD-10-CM | POA: Diagnosis not present

## 2019-09-20 ENCOUNTER — Other Ambulatory Visit: Payer: Self-pay

## 2019-09-20 ENCOUNTER — Other Ambulatory Visit (HOSPITAL_COMMUNITY)
Admission: RE | Admit: 2019-09-20 | Discharge: 2019-09-20 | Disposition: A | Discharge status: Home / Self Care | Payer: Medicare HMO | Source: Ambulatory Visit | Attending: Pulmonary Disease | Admitting: Pulmonary Disease

## 2019-09-20 DIAGNOSIS — Z01812 Encounter for preprocedural laboratory examination: Secondary | ICD-10-CM | POA: Insufficient documentation

## 2019-09-20 DIAGNOSIS — Z20822 Contact with and (suspected) exposure to covid-19: Secondary | ICD-10-CM | POA: Insufficient documentation

## 2019-09-21 LAB — SARS CORONAVIRUS 2 (TAT 6-24 HRS): SARS Coronavirus 2: NEGATIVE

## 2019-09-22 ENCOUNTER — Ambulatory Visit: Payer: Medicare HMO | Attending: Primary Care | Admitting: Pulmonary Disease

## 2019-09-22 ENCOUNTER — Other Ambulatory Visit: Payer: Self-pay

## 2019-09-22 DIAGNOSIS — G4733 Obstructive sleep apnea (adult) (pediatric): Secondary | ICD-10-CM | POA: Insufficient documentation

## 2019-09-22 DIAGNOSIS — J9611 Chronic respiratory failure with hypoxia: Secondary | ICD-10-CM

## 2019-09-29 ENCOUNTER — Other Ambulatory Visit (HOSPITAL_COMMUNITY): Payer: Medicare HMO

## 2019-09-30 ENCOUNTER — Telehealth: Payer: Self-pay | Admitting: Primary Care

## 2019-09-30 DIAGNOSIS — G4733 Obstructive sleep apnea (adult) (pediatric): Secondary | ICD-10-CM

## 2019-09-30 NOTE — Telephone Encounter (Signed)
-   Please place order to change BIPAP setting to 13/7 cm H2O. - Needs Medium size Resmed Nasal Cradle Mask AirFit N30 mask - Heated humidification. - No supplemental oxygen required

## 2019-09-30 NOTE — Procedures (Signed)
    Patient Name: [name], [name] Date: 09/22/2019 Gender: Male D.O.B: 18-Nov-1945 Age (years): 56 Referring Provider: Geraldo Pitter NP Height (inches): 72 Interpreting Physician: Chesley Mires MD, ABSM Weight (lbs): 302 RPSGT: Peak, Robert BMI: 41 MRN: [id number] Neck Size: 20.00  CLINICAL INFORMATION The patient is referred for a BiPAP titration to treat sleep apnea.  Date of NPSG 04/07/14: AHI 97.  SLEEP STUDY TECHNIQUE As per the AASM Manual for the Scoring of Sleep and Associated Events v2.3 (April 2016) with a hypopnea requiring 4% desaturations.  The channels recorded and monitored were frontal, central and occipital EEG, electrooculogram (EOG), submentalis EMG (chin), nasal and oral airflow, thoracic and abdominal wall motion, anterior tibialis EMG, snore microphone, electrocardiogram, and pulse oximetry. Bilevel positive airway pressure (BPAP) was initiated at the beginning of the study and titrated to treat sleep-disordered breathing.  MEDICATIONS Medications self-administered by patient taken the night of the study : N/A  RESPIRATORY PARAMETERS Optimal IPAP Pressure (cm): 13 AHI at Optimal Pressure (/hr) 8.2 Optimal EPAP Pressure (cm): 7   Overall Minimal O2 (%): 84.0 Minimal O2 at Optimal Pressure (%): 85.0  SLEEP ARCHITECTURE Start Time: 8:59:44 PM Stop Time: 5:06:44 AM Total Time (min): 487 Total Sleep Time (min): 375.1 Sleep Latency (min): 2.9 Sleep Efficiency (%): 77.0% REM Latency (min): 94.5 WASO (min): 109.0 Stage N1 (%): 12.4% Stage N2 (%): 53.5% Stage N3 (%): 10.5% Stage R (%): 23.6 Supine (%): 2.80 Arousal Index (/hr): 30.7    CARDIAC DATA The 2 lead EKG demonstrated sinus rhythm. The mean heart rate was 70.4 beats per minute. Other EKG findings include: None.  LEG MOVEMENT DATA The total Periodic Limb Movements of Sleep (PLMS) were 0. The PLMS index was 0.0. A PLMS index of <15 is considered normal in adults.  IMPRESSIONS - He seemed to  do best with Bipap 13/7 cm H2O.  He developed central apnea and periodic breathing with higher pressure settings. - He did not require supplemental oxygen during this study.  DIAGNOSIS - Obstructive Sleep Apnea (327.23 [G47.33 ICD-10])  RECOMMENDATIONS - Trial of BiPAP therapy on 13/7 cm H2O with a Medium size Resmed Nasal Cradle Mask AirFit N30 mask and heated humidification.  [Electronically signed] 09/30/2019 10:23 AM  Chesley Mires MD, ABSM Diplomate, American Board of Sleep Medicine   NPI: [id number]

## 2019-10-03 NOTE — Telephone Encounter (Signed)
Order was placed per [facility] of his BIPAP . Nothing else needed.

## 2019-10-18 DIAGNOSIS — C61 Malignant neoplasm of prostate: Secondary | ICD-10-CM | POA: Diagnosis not present

## 2019-10-20 DIAGNOSIS — Z6841 Body Mass Index (BMI) 40.0 and over, adult: Secondary | ICD-10-CM | POA: Diagnosis not present

## 2019-10-20 DIAGNOSIS — K5900 Constipation, unspecified: Secondary | ICD-10-CM | POA: Diagnosis not present

## 2019-10-20 DIAGNOSIS — K219 Gastro-esophageal reflux disease without esophagitis: Secondary | ICD-10-CM | POA: Diagnosis not present

## 2019-10-20 DIAGNOSIS — I509 Heart failure, unspecified: Secondary | ICD-10-CM | POA: Diagnosis not present

## 2019-10-20 DIAGNOSIS — C61 Malignant neoplasm of prostate: Secondary | ICD-10-CM | POA: Diagnosis not present

## 2019-10-20 DIAGNOSIS — E261 Secondary hyperaldosteronism: Secondary | ICD-10-CM | POA: Diagnosis not present

## 2019-10-20 DIAGNOSIS — I110 Hypertensive heart disease with heart failure: Secondary | ICD-10-CM | POA: Diagnosis not present

## 2019-10-20 DIAGNOSIS — I252 Old myocardial infarction: Secondary | ICD-10-CM | POA: Diagnosis not present

## 2019-10-20 DIAGNOSIS — G4730 Sleep apnea, unspecified: Secondary | ICD-10-CM | POA: Diagnosis not present

## 2019-11-10 DIAGNOSIS — E7800 Pure hypercholesterolemia, unspecified: Secondary | ICD-10-CM | POA: Diagnosis not present

## 2019-11-10 DIAGNOSIS — I252 Old myocardial infarction: Secondary | ICD-10-CM | POA: Diagnosis not present

## 2019-11-10 DIAGNOSIS — N1831 Chronic kidney disease, stage 3a: Secondary | ICD-10-CM | POA: Diagnosis not present

## 2019-11-10 DIAGNOSIS — I2510 Atherosclerotic heart disease of native coronary artery without angina pectoris: Secondary | ICD-10-CM | POA: Diagnosis not present

## 2019-11-10 DIAGNOSIS — I10 Essential (primary) hypertension: Secondary | ICD-10-CM | POA: Diagnosis not present

## 2019-11-10 DIAGNOSIS — I129 Hypertensive chronic kidney disease with stage 1 through stage 4 chronic kidney disease, or unspecified chronic kidney disease: Secondary | ICD-10-CM | POA: Diagnosis not present

## 2019-11-10 DIAGNOSIS — C61 Malignant neoplasm of prostate: Secondary | ICD-10-CM | POA: Diagnosis not present

## 2019-11-10 DIAGNOSIS — N183 Chronic kidney disease, stage 3 unspecified: Secondary | ICD-10-CM | POA: Diagnosis not present

## 2019-11-11 ENCOUNTER — Telehealth: Payer: Self-pay | Admitting: Pulmonary Disease

## 2019-11-11 DIAGNOSIS — G4733 Obstructive sleep apnea (adult) (pediatric): Secondary | ICD-10-CM

## 2019-11-11 NOTE — Telephone Encounter (Signed)
Spoke with pt's son and he wanted to know the results from the [place]. I advised him that an order was sent to [facility] to change Bipap settings. He didn't know anything about this. I called [facility] and they stated the order was received and they did change the bipap settings on pt's machine. His son also stated they wanted to get a new bipap machine and [facility] stated they were eligible for another machine. Can we send an order for the bipap machine? Please advise.  Martyn Ehrich, NP     4:00 PM Note   - Please place order to change BIPAP setting to 13/7 cm H2O. - Needs Medium size Resmed Nasal Cradle Mask AirFit N30 mask - Heated humidification. - No supplemental oxygen required

## 2019-11-14 DIAGNOSIS — I129 Hypertensive chronic kidney disease with stage 1 through stage 4 chronic kidney disease, or unspecified chronic kidney disease: Secondary | ICD-10-CM | POA: Diagnosis not present

## 2019-11-14 DIAGNOSIS — C61 Malignant neoplasm of prostate: Secondary | ICD-10-CM | POA: Diagnosis not present

## 2019-11-14 DIAGNOSIS — N1831 Chronic kidney disease, stage 3a: Secondary | ICD-10-CM | POA: Diagnosis not present

## 2019-11-14 DIAGNOSIS — I10 Essential (primary) hypertension: Secondary | ICD-10-CM | POA: Diagnosis not present

## 2019-11-14 DIAGNOSIS — N183 Chronic kidney disease, stage 3 unspecified: Secondary | ICD-10-CM | POA: Diagnosis not present

## 2019-11-14 DIAGNOSIS — I252 Old myocardial infarction: Secondary | ICD-10-CM | POA: Diagnosis not present

## 2019-11-14 DIAGNOSIS — E7800 Pure hypercholesterolemia, unspecified: Secondary | ICD-10-CM | POA: Diagnosis not present

## 2019-11-14 DIAGNOSIS — I2510 Atherosclerotic heart disease of native coronary artery without angina pectoris: Secondary | ICD-10-CM | POA: Diagnosis not present

## 2019-11-16 DIAGNOSIS — J449 Chronic obstructive pulmonary disease, unspecified: Secondary | ICD-10-CM | POA: Diagnosis not present

## 2019-11-16 LAB — CUP PACEART REMOTE DEVICE CHECK
Battery Remaining Longevity: 96 mo
Battery Voltage: 3.05 V
Brady Statistic AS VP Percent: 72.57 %
Brady Statistic AS VS Percent: 0.06 %
Brady Statistic RV Percent Paced: 97.23 %
Date Time Interrogation Session: 20210602060700
Implantable Pulse Generator Implant Date: 20201201
Lead Channel Impedance Value: 620 Ohm
Lead Channel Pacing Threshold Amplitude: 0.375 V
Lead Channel Pacing Threshold Pulse Width: 0.24 ms
Lead Channel Sensing Intrinsic Amplitude: 25.538 mV
Lead Channel Setting Pacing Amplitude: 0.875
Lead Channel Setting Pacing Pulse Width: 0.24 ms
Lead Channel Setting Sensing Sensitivity: 2 mV

## 2019-11-17 NOTE — Telephone Encounter (Signed)
Frankie calling back. Tharon Aquas can be reached at [phone].

## 2019-11-17 NOTE — Telephone Encounter (Signed)
Attempted to call pt's son Tharon Aquas but unable to reach and unable to leave a VM due to no machine kicking in. Will try to call back later.

## 2019-11-17 NOTE — Telephone Encounter (Signed)
Okay to send order for new Bipap machine at 13/7 cm H2O with heated humidity.  He needs ROV in 2 to 3 months with me or NP after he gets new Bipap machine.

## 2019-11-17 NOTE — Telephone Encounter (Signed)
Spoke with [name]. He is aware that Dr. Halford Chessman is ok with [place] ordering a new bipap for his Dad. He wishes to stay with [facility]. I advised him I would place the order today.   He has been scheduled for a follow up on 02/06/20 at 9am.   Nothing further needed at time of call.

## 2019-11-28 DIAGNOSIS — G4733 Obstructive sleep apnea (adult) (pediatric): Secondary | ICD-10-CM | POA: Diagnosis not present

## 2019-12-07 DIAGNOSIS — I252 Old myocardial infarction: Secondary | ICD-10-CM | POA: Diagnosis not present

## 2019-12-07 DIAGNOSIS — N183 Chronic kidney disease, stage 3 unspecified: Secondary | ICD-10-CM | POA: Diagnosis not present

## 2019-12-07 DIAGNOSIS — I10 Essential (primary) hypertension: Secondary | ICD-10-CM | POA: Diagnosis not present

## 2019-12-07 DIAGNOSIS — N1831 Chronic kidney disease, stage 3a: Secondary | ICD-10-CM | POA: Diagnosis not present

## 2019-12-07 DIAGNOSIS — I129 Hypertensive chronic kidney disease with stage 1 through stage 4 chronic kidney disease, or unspecified chronic kidney disease: Secondary | ICD-10-CM | POA: Diagnosis not present

## 2019-12-07 DIAGNOSIS — E7800 Pure hypercholesterolemia, unspecified: Secondary | ICD-10-CM | POA: Diagnosis not present

## 2019-12-07 DIAGNOSIS — C61 Malignant neoplasm of prostate: Secondary | ICD-10-CM | POA: Diagnosis not present

## 2019-12-07 DIAGNOSIS — J45909 Unspecified asthma, uncomplicated: Secondary | ICD-10-CM | POA: Diagnosis not present

## 2019-12-07 DIAGNOSIS — I2510 Atherosclerotic heart disease of native coronary artery without angina pectoris: Secondary | ICD-10-CM | POA: Diagnosis not present

## 2019-12-14 DIAGNOSIS — I2510 Atherosclerotic heart disease of native coronary artery without angina pectoris: Secondary | ICD-10-CM | POA: Diagnosis not present

## 2019-12-14 DIAGNOSIS — I10 Essential (primary) hypertension: Secondary | ICD-10-CM | POA: Diagnosis not present

## 2019-12-14 DIAGNOSIS — N183 Chronic kidney disease, stage 3 unspecified: Secondary | ICD-10-CM | POA: Diagnosis not present

## 2019-12-14 DIAGNOSIS — I252 Old myocardial infarction: Secondary | ICD-10-CM | POA: Diagnosis not present

## 2019-12-14 DIAGNOSIS — C61 Malignant neoplasm of prostate: Secondary | ICD-10-CM | POA: Diagnosis not present

## 2019-12-14 DIAGNOSIS — E7800 Pure hypercholesterolemia, unspecified: Secondary | ICD-10-CM | POA: Diagnosis not present

## 2019-12-14 DIAGNOSIS — N1831 Chronic kidney disease, stage 3a: Secondary | ICD-10-CM | POA: Diagnosis not present

## 2019-12-14 DIAGNOSIS — J45909 Unspecified asthma, uncomplicated: Secondary | ICD-10-CM | POA: Diagnosis not present

## 2019-12-14 DIAGNOSIS — I129 Hypertensive chronic kidney disease with stage 1 through stage 4 chronic kidney disease, or unspecified chronic kidney disease: Secondary | ICD-10-CM | POA: Diagnosis not present

## 2019-12-27 DIAGNOSIS — H903 Sensorineural hearing loss, bilateral: Secondary | ICD-10-CM | POA: Diagnosis not present

## 2019-12-28 DIAGNOSIS — G4733 Obstructive sleep apnea (adult) (pediatric): Secondary | ICD-10-CM | POA: Diagnosis not present

## 2020-01-06 DIAGNOSIS — E7800 Pure hypercholesterolemia, unspecified: Secondary | ICD-10-CM | POA: Diagnosis not present

## 2020-01-06 DIAGNOSIS — I252 Old myocardial infarction: Secondary | ICD-10-CM | POA: Diagnosis not present

## 2020-01-06 DIAGNOSIS — J45909 Unspecified asthma, uncomplicated: Secondary | ICD-10-CM | POA: Diagnosis not present

## 2020-01-06 DIAGNOSIS — I129 Hypertensive chronic kidney disease with stage 1 through stage 4 chronic kidney disease, or unspecified chronic kidney disease: Secondary | ICD-10-CM | POA: Diagnosis not present

## 2020-01-06 DIAGNOSIS — I2510 Atherosclerotic heart disease of native coronary artery without angina pectoris: Secondary | ICD-10-CM | POA: Diagnosis not present

## 2020-01-06 DIAGNOSIS — N1831 Chronic kidney disease, stage 3a: Secondary | ICD-10-CM | POA: Diagnosis not present

## 2020-01-06 DIAGNOSIS — C61 Malignant neoplasm of prostate: Secondary | ICD-10-CM | POA: Diagnosis not present

## 2020-01-06 DIAGNOSIS — I10 Essential (primary) hypertension: Secondary | ICD-10-CM | POA: Diagnosis not present

## 2020-01-17 DIAGNOSIS — H543 Unqualified visual loss, both eyes: Secondary | ICD-10-CM | POA: Diagnosis not present

## 2020-01-17 DIAGNOSIS — I129 Hypertensive chronic kidney disease with stage 1 through stage 4 chronic kidney disease, or unspecified chronic kidney disease: Secondary | ICD-10-CM | POA: Diagnosis not present

## 2020-01-17 DIAGNOSIS — C61 Malignant neoplasm of prostate: Secondary | ICD-10-CM | POA: Diagnosis not present

## 2020-01-17 DIAGNOSIS — K5900 Constipation, unspecified: Secondary | ICD-10-CM | POA: Diagnosis not present

## 2020-01-17 DIAGNOSIS — I441 Atrioventricular block, second degree: Secondary | ICD-10-CM | POA: Diagnosis not present

## 2020-01-17 DIAGNOSIS — I2510 Atherosclerotic heart disease of native coronary artery without angina pectoris: Secondary | ICD-10-CM | POA: Diagnosis not present

## 2020-01-17 DIAGNOSIS — K219 Gastro-esophageal reflux disease without esophagitis: Secondary | ICD-10-CM | POA: Diagnosis not present

## 2020-01-17 DIAGNOSIS — E7800 Pure hypercholesterolemia, unspecified: Secondary | ICD-10-CM | POA: Diagnosis not present

## 2020-01-17 DIAGNOSIS — M109 Gout, unspecified: Secondary | ICD-10-CM | POA: Diagnosis not present

## 2020-01-18 DIAGNOSIS — H903 Sensorineural hearing loss, bilateral: Secondary | ICD-10-CM | POA: Diagnosis not present

## 2020-01-27 DIAGNOSIS — N1831 Chronic kidney disease, stage 3a: Secondary | ICD-10-CM | POA: Diagnosis not present

## 2020-01-28 DIAGNOSIS — G4733 Obstructive sleep apnea (adult) (pediatric): Secondary | ICD-10-CM | POA: Diagnosis not present

## 2020-02-06 ENCOUNTER — Ambulatory Visit (INDEPENDENT_AMBULATORY_CARE_PROVIDER_SITE_OTHER): Payer: Medicare HMO | Admitting: Pulmonary Disease

## 2020-02-06 ENCOUNTER — Other Ambulatory Visit: Payer: Self-pay

## 2020-02-06 ENCOUNTER — Encounter: Payer: Self-pay | Admitting: Pulmonary Disease

## 2020-02-06 VITALS — BP 142/66 | HR 81 | Temp 98.0°F | Ht 74.0 in | Wt 300.0 lb

## 2020-02-06 DIAGNOSIS — J4530 Mild persistent asthma, uncomplicated: Secondary | ICD-10-CM | POA: Diagnosis not present

## 2020-02-06 DIAGNOSIS — E669 Obesity, unspecified: Secondary | ICD-10-CM | POA: Diagnosis not present

## 2020-02-06 DIAGNOSIS — F458 Other somatoform disorders: Secondary | ICD-10-CM

## 2020-02-06 DIAGNOSIS — G4733 Obstructive sleep apnea (adult) (pediatric): Secondary | ICD-10-CM

## 2020-02-06 DIAGNOSIS — G4730 Sleep apnea, unspecified: Secondary | ICD-10-CM

## 2020-02-06 DIAGNOSIS — R69 Illness, unspecified: Secondary | ICD-10-CM | POA: Diagnosis not present

## 2020-02-06 NOTE — Patient Instructions (Signed)
Will change your Bipap setting to 11/7 cm water  Will have [facility] remove the home oxygen equipment from your home since you are no longer using this  Follow up in 1 year

## 2020-02-06 NOTE — Progress Notes (Signed)
[name]  Chief Complaint  Patient presents with  . Follow-up    Reports he feels CPAP pushes too much air sometimes    Constitutional:  BP (!) 142/66 (BP Location: Left Arm, Cuff Size: Large)   Pulse 81   Temp 98 F (36.7 C)   Ht 6\' 2"  (1.88 m)   Wt 300 lb (136.1 kg)   SpO2 93%   BMI 38.52 kg/m   Past Medical History:  CAD s/p stent, HTN, HLD, PUD, Gout, Prostate cancer, PNA, GERD, PUD, Nephrolithiasis, Blind Lt eye  Summary:  [name] is a 74 y.o. male former smoker with obstructive sleep apnea, and asthma.  Subjective:   Uses Bipap nightly.  Has nasal pillow mask.  Feels like pressure is too high and gets bloating and belching.  Not having cough, wheeze, sputum.  Not needing to use albuterol.  Hasn't used supplemental oxygen since he got started on Bipap.  Still has home oxygen equipment.    Physical Exam:   Appearance - well kempt  ENMT - no sinus tenderness, no nasal discharge, no oral exudate, Mallampati 3, poor dentition  Respiratory - no wheeze, or rales  CV - regular rate and rhythm, no murmurs  GI - soft, non tender  Lymph - no adenopathy noted in neck  Ext - no edema  Skin - no rashes  Neuro - normal strength, oriented x 3  Psych - normal mood and affect   Assessment/Plan:   Obstructive sleep apnea. - he is compliant with Bipap - uses Huey Romans - has aerophagia >> will change Bipap to 11/7 cm H2O - discussed options to improve mask fit - will have his home oxygen equipment removed since he no longer uses this  Mild, intermittent asthma. - prn albuterol  Obesity. - he is aware of how his weight can impact his health, particularly with regard to sleep apnea  A total of 22 minutes spent addressing patient care issues on day of visit.  Follow up:  Patient Instructions  Will change your Bipap setting to 11/7 cm water  Will have [facility] remove the home oxygen equipment from your home since  you are no longer using this  Follow up in 1 year   Signature:  Chesley Mires, MD [place] Pager: [phone] 02/06/2020, 9:22 AM  Flow Sheet     Pulmonary tests:   PFT 04/08/17 >> FEV1 1.71 (50%), FEV1% 77, TLC 6.08 (81%), RV 3.56 (135%), DLCO 68%, +BD  Sleep tests:   PSG 04/07/14 >> AHI 97  BiPAP 01/04/20 to 02/02/20 >> used on 30 of 30 nights with average 6 hrs 4 min.  Average AHI 5.7 with Bipap 13/7 cm H2O.  Cardiac tests:   Echo 04/15/17 >> EF 60 to 65%, mod LA dilation, PAS 36 mmHg  Medications:   Allergies as of 02/06/2020      Reactions   Prednisone Swelling      Medication List       Accurate as of February 06, 2020  9:22 AM. If you have any questions, ask your nurse or doctor.        acetaminophen 500 MG tablet Commonly known as: TYLENOL Take 500 mg by mouth every 6 (six) hours as needed for moderate pain.   albuterol 108 (90 Base) MCG/ACT inhaler Commonly known as: VENTOLIN HFA Inhale 1-2 puffs into the lungs every 6 (six) hours as needed for wheezing or shortness of breath.   allopurinol 300 MG  tablet Commonly known as: ZYLOPRIM Take 300 mg by mouth Daily.   amLODipine 10 MG tablet Commonly known as: NORVASC Take 10 mg by mouth Daily.   aspirin 81 MG EC tablet Take 1 tablet (81 mg total) by mouth daily.   atorvastatin 20 MG tablet Commonly known as: LIPITOR Take 20 mg by mouth once a week.   Dialyvite Vitamin D 5000 125 MCG (5000 UT) capsule Generic drug: Cholecalciferol Take 5,000 Units by mouth 3 (three) times a week.   furosemide 20 MG tablet Commonly known as: LASIX Take 1 tablet (20 mg total) by mouth daily.   hydrochlorothiazide 25 MG tablet Commonly known as: HYDRODIURIL Take 25 mg by mouth daily.   losartan 100 MG tablet Commonly known as: COZAAR Take 100 mg by mouth daily.   ondansetron 8 MG tablet Commonly known as: ZOFRAN TAKE 1 2 TO 1 (ONE HALF TO ONE) TABLET BY MOUTH EVERY 8 HOURS AS NEEDED  FOR NAUSEA   pantoprazole 40 MG tablet Commonly known as: PROTONIX Take 40 mg by mouth daily.       Past Surgical History:  He  has a past surgical history that includes Cholecystectomy; Lumbar laminectomy; Appendectomy; Appendectomy (~ 1985); Laparoscopic cholecystectomy; Back surgery; Posterior lumbar fusion (2003?); Coronary angioplasty with stent (~ 1995; 2007); Prostate biopsy (2015); Lithotripsy (X 1); Cardiac catheterization (N/A, 02/08/2015); Cardiac catheterization (N/A, 02/08/2015); ORIF tibia plateau (Right, 11/05/2017); LEFT HEART CATH AND CORONARY ANGIOGRAPHY (N/A, 07/13/2018); and PACEMAKER LEADLESS INSERTION (N/A, 05/17/2019).  Family History:  His family history includes Aneurysm in his father; COPD in his sister; Coronary artery disease in his mother; Hypertension in his father and mother; Sleep apnea in an other family member.  Social History:  He  reports that he quit smoking about 36 years ago. His smoking use included cigarettes. He has a 105.00 pack-year smoking history. He has never used smokeless tobacco. He reports current alcohol use. He reports that he does not use drugs.

## 2020-02-08 DIAGNOSIS — J45909 Unspecified asthma, uncomplicated: Secondary | ICD-10-CM | POA: Diagnosis not present

## 2020-02-08 DIAGNOSIS — N183 Chronic kidney disease, stage 3 unspecified: Secondary | ICD-10-CM | POA: Diagnosis not present

## 2020-02-08 DIAGNOSIS — I252 Old myocardial infarction: Secondary | ICD-10-CM | POA: Diagnosis not present

## 2020-02-08 DIAGNOSIS — N1831 Chronic kidney disease, stage 3a: Secondary | ICD-10-CM | POA: Diagnosis not present

## 2020-02-08 DIAGNOSIS — C61 Malignant neoplasm of prostate: Secondary | ICD-10-CM | POA: Diagnosis not present

## 2020-02-08 DIAGNOSIS — E7800 Pure hypercholesterolemia, unspecified: Secondary | ICD-10-CM | POA: Diagnosis not present

## 2020-02-08 DIAGNOSIS — K219 Gastro-esophageal reflux disease without esophagitis: Secondary | ICD-10-CM | POA: Diagnosis not present

## 2020-02-08 DIAGNOSIS — I10 Essential (primary) hypertension: Secondary | ICD-10-CM | POA: Diagnosis not present

## 2020-02-08 DIAGNOSIS — I2510 Atherosclerotic heart disease of native coronary artery without angina pectoris: Secondary | ICD-10-CM | POA: Diagnosis not present

## 2020-02-08 DIAGNOSIS — I129 Hypertensive chronic kidney disease with stage 1 through stage 4 chronic kidney disease, or unspecified chronic kidney disease: Secondary | ICD-10-CM | POA: Diagnosis not present

## 2020-02-15 ENCOUNTER — Ambulatory Visit (INDEPENDENT_AMBULATORY_CARE_PROVIDER_SITE_OTHER): Payer: Medicare HMO | Admitting: *Deleted

## 2020-02-15 DIAGNOSIS — I441 Atrioventricular block, second degree: Secondary | ICD-10-CM | POA: Diagnosis not present

## 2020-02-17 LAB — CUP PACEART REMOTE DEVICE CHECK
Battery Remaining Longevity: 96 mo
Battery Voltage: 3.03 V
Brady Statistic AS VP Percent: 71.85 %
Brady Statistic AS VS Percent: 0.07 %
Brady Statistic RV Percent Paced: 97.19 %
Date Time Interrogation Session: 20210902105800
Implantable Pulse Generator Implant Date: 20201201
Lead Channel Impedance Value: 600 Ohm
Lead Channel Pacing Threshold Amplitude: 0.5 V
Lead Channel Pacing Threshold Pulse Width: 0.24 ms
Lead Channel Sensing Intrinsic Amplitude: 17.888 mV
Lead Channel Setting Pacing Amplitude: 1 V
Lead Channel Setting Pacing Pulse Width: 0.24 ms
Lead Channel Setting Sensing Sensitivity: 2 mV

## 2020-02-17 NOTE — Progress Notes (Signed)
Remote pacemaker transmission.   

## 2020-02-28 DIAGNOSIS — G4733 Obstructive sleep apnea (adult) (pediatric): Secondary | ICD-10-CM | POA: Diagnosis not present

## 2020-02-29 DIAGNOSIS — G4733 Obstructive sleep apnea (adult) (pediatric): Secondary | ICD-10-CM | POA: Diagnosis not present

## 2020-03-04 DIAGNOSIS — R05 Cough: Secondary | ICD-10-CM | POA: Diagnosis not present

## 2020-03-04 DIAGNOSIS — Z20822 Contact with and (suspected) exposure to covid-19: Secondary | ICD-10-CM | POA: Diagnosis not present

## 2020-03-06 DIAGNOSIS — I2510 Atherosclerotic heart disease of native coronary artery without angina pectoris: Secondary | ICD-10-CM | POA: Diagnosis not present

## 2020-03-06 DIAGNOSIS — I252 Old myocardial infarction: Secondary | ICD-10-CM | POA: Diagnosis not present

## 2020-03-06 DIAGNOSIS — C61 Malignant neoplasm of prostate: Secondary | ICD-10-CM | POA: Diagnosis not present

## 2020-03-06 DIAGNOSIS — N1831 Chronic kidney disease, stage 3a: Secondary | ICD-10-CM | POA: Diagnosis not present

## 2020-03-06 DIAGNOSIS — I129 Hypertensive chronic kidney disease with stage 1 through stage 4 chronic kidney disease, or unspecified chronic kidney disease: Secondary | ICD-10-CM | POA: Diagnosis not present

## 2020-03-06 DIAGNOSIS — J45909 Unspecified asthma, uncomplicated: Secondary | ICD-10-CM | POA: Diagnosis not present

## 2020-03-06 DIAGNOSIS — K219 Gastro-esophageal reflux disease without esophagitis: Secondary | ICD-10-CM | POA: Diagnosis not present

## 2020-03-06 DIAGNOSIS — I10 Essential (primary) hypertension: Secondary | ICD-10-CM | POA: Diagnosis not present

## 2020-03-06 DIAGNOSIS — N183 Chronic kidney disease, stage 3 unspecified: Secondary | ICD-10-CM | POA: Diagnosis not present

## 2020-03-06 DIAGNOSIS — E7800 Pure hypercholesterolemia, unspecified: Secondary | ICD-10-CM | POA: Diagnosis not present

## 2020-03-29 DIAGNOSIS — G4733 Obstructive sleep apnea (adult) (pediatric): Secondary | ICD-10-CM | POA: Diagnosis not present

## 2020-04-04 DIAGNOSIS — I2510 Atherosclerotic heart disease of native coronary artery without angina pectoris: Secondary | ICD-10-CM | POA: Diagnosis not present

## 2020-04-04 DIAGNOSIS — I10 Essential (primary) hypertension: Secondary | ICD-10-CM | POA: Diagnosis not present

## 2020-04-04 DIAGNOSIS — C61 Malignant neoplasm of prostate: Secondary | ICD-10-CM | POA: Diagnosis not present

## 2020-04-04 DIAGNOSIS — E7800 Pure hypercholesterolemia, unspecified: Secondary | ICD-10-CM | POA: Diagnosis not present

## 2020-04-04 DIAGNOSIS — N1831 Chronic kidney disease, stage 3a: Secondary | ICD-10-CM | POA: Diagnosis not present

## 2020-04-04 DIAGNOSIS — I129 Hypertensive chronic kidney disease with stage 1 through stage 4 chronic kidney disease, or unspecified chronic kidney disease: Secondary | ICD-10-CM | POA: Diagnosis not present

## 2020-04-04 DIAGNOSIS — I252 Old myocardial infarction: Secondary | ICD-10-CM | POA: Diagnosis not present

## 2020-04-04 DIAGNOSIS — N183 Chronic kidney disease, stage 3 unspecified: Secondary | ICD-10-CM | POA: Diagnosis not present

## 2020-04-04 DIAGNOSIS — K219 Gastro-esophageal reflux disease without esophagitis: Secondary | ICD-10-CM | POA: Diagnosis not present

## 2020-04-04 DIAGNOSIS — J45909 Unspecified asthma, uncomplicated: Secondary | ICD-10-CM | POA: Diagnosis not present

## 2020-04-29 DIAGNOSIS — G4733 Obstructive sleep apnea (adult) (pediatric): Secondary | ICD-10-CM | POA: Diagnosis not present

## 2020-05-01 DIAGNOSIS — J45909 Unspecified asthma, uncomplicated: Secondary | ICD-10-CM | POA: Diagnosis not present

## 2020-05-01 DIAGNOSIS — I129 Hypertensive chronic kidney disease with stage 1 through stage 4 chronic kidney disease, or unspecified chronic kidney disease: Secondary | ICD-10-CM | POA: Diagnosis not present

## 2020-05-01 DIAGNOSIS — K219 Gastro-esophageal reflux disease without esophagitis: Secondary | ICD-10-CM | POA: Diagnosis not present

## 2020-05-01 DIAGNOSIS — I2510 Atherosclerotic heart disease of native coronary artery without angina pectoris: Secondary | ICD-10-CM | POA: Diagnosis not present

## 2020-05-01 DIAGNOSIS — I10 Essential (primary) hypertension: Secondary | ICD-10-CM | POA: Diagnosis not present

## 2020-05-01 DIAGNOSIS — C61 Malignant neoplasm of prostate: Secondary | ICD-10-CM | POA: Diagnosis not present

## 2020-05-01 DIAGNOSIS — I252 Old myocardial infarction: Secondary | ICD-10-CM | POA: Diagnosis not present

## 2020-05-01 DIAGNOSIS — N1831 Chronic kidney disease, stage 3a: Secondary | ICD-10-CM | POA: Diagnosis not present

## 2020-05-01 DIAGNOSIS — N183 Chronic kidney disease, stage 3 unspecified: Secondary | ICD-10-CM | POA: Diagnosis not present

## 2020-05-01 DIAGNOSIS — E7800 Pure hypercholesterolemia, unspecified: Secondary | ICD-10-CM | POA: Diagnosis not present

## 2020-05-16 ENCOUNTER — Ambulatory Visit (INDEPENDENT_AMBULATORY_CARE_PROVIDER_SITE_OTHER): Payer: Medicare HMO

## 2020-05-16 DIAGNOSIS — I441 Atrioventricular block, second degree: Secondary | ICD-10-CM

## 2020-05-18 LAB — CUP PACEART REMOTE DEVICE CHECK
Battery Remaining Longevity: 96 mo
Battery Voltage: 3.02 V
Brady Statistic AS VP Percent: 71.75 %
Brady Statistic AS VS Percent: 0.08 %
Brady Statistic RV Percent Paced: 97.04 %
Date Time Interrogation Session: 20211202105800
Implantable Pulse Generator Implant Date: 20201201
Lead Channel Impedance Value: 590 Ohm
Lead Channel Pacing Threshold Amplitude: 0.5 V
Lead Channel Pacing Threshold Pulse Width: 0.24 ms
Lead Channel Sensing Intrinsic Amplitude: 28.575 mV
Lead Channel Setting Pacing Amplitude: 1 V
Lead Channel Setting Pacing Pulse Width: 0.24 ms
Lead Channel Setting Sensing Sensitivity: 2 mV

## 2020-05-22 NOTE — Progress Notes (Signed)
Remote pacemaker transmission.   

## 2020-05-29 DIAGNOSIS — K219 Gastro-esophageal reflux disease without esophagitis: Secondary | ICD-10-CM | POA: Diagnosis not present

## 2020-05-29 DIAGNOSIS — I10 Essential (primary) hypertension: Secondary | ICD-10-CM | POA: Diagnosis not present

## 2020-05-29 DIAGNOSIS — I252 Old myocardial infarction: Secondary | ICD-10-CM | POA: Diagnosis not present

## 2020-05-29 DIAGNOSIS — I2510 Atherosclerotic heart disease of native coronary artery without angina pectoris: Secondary | ICD-10-CM | POA: Diagnosis not present

## 2020-05-29 DIAGNOSIS — G4733 Obstructive sleep apnea (adult) (pediatric): Secondary | ICD-10-CM | POA: Diagnosis not present

## 2020-05-29 DIAGNOSIS — I129 Hypertensive chronic kidney disease with stage 1 through stage 4 chronic kidney disease, or unspecified chronic kidney disease: Secondary | ICD-10-CM | POA: Diagnosis not present

## 2020-05-29 DIAGNOSIS — N1831 Chronic kidney disease, stage 3a: Secondary | ICD-10-CM | POA: Diagnosis not present

## 2020-05-29 DIAGNOSIS — C61 Malignant neoplasm of prostate: Secondary | ICD-10-CM | POA: Diagnosis not present

## 2020-05-29 DIAGNOSIS — E7800 Pure hypercholesterolemia, unspecified: Secondary | ICD-10-CM | POA: Diagnosis not present

## 2020-05-29 DIAGNOSIS — J45909 Unspecified asthma, uncomplicated: Secondary | ICD-10-CM | POA: Diagnosis not present

## 2020-05-29 DIAGNOSIS — N183 Chronic kidney disease, stage 3 unspecified: Secondary | ICD-10-CM | POA: Diagnosis not present

## 2020-05-31 DIAGNOSIS — G4733 Obstructive sleep apnea (adult) (pediatric): Secondary | ICD-10-CM | POA: Diagnosis not present

## 2020-06-22 ENCOUNTER — Other Ambulatory Visit: Payer: Self-pay | Admitting: Adult Health

## 2020-06-26 ENCOUNTER — Emergency Department (HOSPITAL_COMMUNITY): Payer: Medicare HMO

## 2020-06-26 ENCOUNTER — Other Ambulatory Visit: Payer: Self-pay

## 2020-06-26 ENCOUNTER — Inpatient Hospital Stay (HOSPITAL_COMMUNITY)
Admission: EM | Admit: 2020-06-26 | Discharge: 2020-07-04 | DRG: 291 | Disposition: A | Discharge status: Home Health | Payer: Medicare HMO | Attending: Internal Medicine | Admitting: Internal Medicine

## 2020-06-26 ENCOUNTER — Encounter (HOSPITAL_COMMUNITY): Payer: Self-pay

## 2020-06-26 DIAGNOSIS — Z7982 Long term (current) use of aspirin: Secondary | ICD-10-CM

## 2020-06-26 DIAGNOSIS — Z825 Family history of asthma and other chronic lower respiratory diseases: Secondary | ICD-10-CM

## 2020-06-26 DIAGNOSIS — Z8546 Personal history of malignant neoplasm of prostate: Secondary | ICD-10-CM

## 2020-06-26 DIAGNOSIS — I2510 Atherosclerotic heart disease of native coronary artery without angina pectoris: Secondary | ICD-10-CM | POA: Diagnosis present

## 2020-06-26 DIAGNOSIS — I110 Hypertensive heart disease with heart failure: Secondary | ICD-10-CM | POA: Diagnosis present

## 2020-06-26 DIAGNOSIS — I5031 Acute diastolic (congestive) heart failure: Secondary | ICD-10-CM | POA: Diagnosis not present

## 2020-06-26 DIAGNOSIS — I25119 Atherosclerotic heart disease of native coronary artery with unspecified angina pectoris: Secondary | ICD-10-CM | POA: Diagnosis not present

## 2020-06-26 DIAGNOSIS — I252 Old myocardial infarction: Secondary | ICD-10-CM | POA: Diagnosis not present

## 2020-06-26 DIAGNOSIS — I5020 Unspecified systolic (congestive) heart failure: Secondary | ICD-10-CM | POA: Diagnosis not present

## 2020-06-26 DIAGNOSIS — Z8711 Personal history of peptic ulcer disease: Secondary | ICD-10-CM | POA: Diagnosis not present

## 2020-06-26 DIAGNOSIS — E7800 Pure hypercholesterolemia, unspecified: Secondary | ICD-10-CM | POA: Diagnosis not present

## 2020-06-26 DIAGNOSIS — Z23 Encounter for immunization: Secondary | ICD-10-CM | POA: Diagnosis not present

## 2020-06-26 DIAGNOSIS — Z79899 Other long term (current) drug therapy: Secondary | ICD-10-CM | POA: Diagnosis not present

## 2020-06-26 DIAGNOSIS — Z87891 Personal history of nicotine dependence: Secondary | ICD-10-CM | POA: Diagnosis not present

## 2020-06-26 DIAGNOSIS — I200 Unstable angina: Secondary | ICD-10-CM | POA: Diagnosis not present

## 2020-06-26 DIAGNOSIS — E669 Obesity, unspecified: Secondary | ICD-10-CM | POA: Diagnosis present

## 2020-06-26 DIAGNOSIS — Z955 Presence of coronary angioplasty implant and graft: Secondary | ICD-10-CM

## 2020-06-26 DIAGNOSIS — R0602 Shortness of breath: Secondary | ICD-10-CM | POA: Diagnosis present

## 2020-06-26 DIAGNOSIS — I25110 Atherosclerotic heart disease of native coronary artery with unstable angina pectoris: Secondary | ICD-10-CM | POA: Diagnosis present

## 2020-06-26 DIAGNOSIS — E785 Hyperlipidemia, unspecified: Secondary | ICD-10-CM | POA: Diagnosis present

## 2020-06-26 DIAGNOSIS — Z8249 Family history of ischemic heart disease and other diseases of the circulatory system: Secondary | ICD-10-CM

## 2020-06-26 DIAGNOSIS — M109 Gout, unspecified: Secondary | ICD-10-CM | POA: Diagnosis present

## 2020-06-26 DIAGNOSIS — I129 Hypertensive chronic kidney disease with stage 1 through stage 4 chronic kidney disease, or unspecified chronic kidney disease: Secondary | ICD-10-CM | POA: Diagnosis not present

## 2020-06-26 DIAGNOSIS — Z20822 Contact with and (suspected) exposure to covid-19: Secondary | ICD-10-CM | POA: Diagnosis present

## 2020-06-26 DIAGNOSIS — G4733 Obstructive sleep apnea (adult) (pediatric): Secondary | ICD-10-CM | POA: Diagnosis not present

## 2020-06-26 DIAGNOSIS — Z950 Presence of cardiac pacemaker: Secondary | ICD-10-CM | POA: Diagnosis not present

## 2020-06-26 DIAGNOSIS — K219 Gastro-esophageal reflux disease without esophagitis: Secondary | ICD-10-CM | POA: Diagnosis present

## 2020-06-26 DIAGNOSIS — Z6839 Body mass index (BMI) 39.0-39.9, adult: Secondary | ICD-10-CM

## 2020-06-26 DIAGNOSIS — I10 Essential (primary) hypertension: Secondary | ICD-10-CM | POA: Diagnosis not present

## 2020-06-26 DIAGNOSIS — Z888 Allergy status to other drugs, medicaments and biological substances status: Secondary | ICD-10-CM

## 2020-06-26 DIAGNOSIS — R079 Chest pain, unspecified: Secondary | ICD-10-CM | POA: Diagnosis not present

## 2020-06-26 DIAGNOSIS — J45909 Unspecified asthma, uncomplicated: Secondary | ICD-10-CM | POA: Diagnosis not present

## 2020-06-26 DIAGNOSIS — J9811 Atelectasis: Secondary | ICD-10-CM | POA: Diagnosis not present

## 2020-06-26 DIAGNOSIS — I441 Atrioventricular block, second degree: Secondary | ICD-10-CM | POA: Diagnosis present

## 2020-06-26 DIAGNOSIS — Z981 Arthrodesis status: Secondary | ICD-10-CM

## 2020-06-26 DIAGNOSIS — C61 Malignant neoplasm of prostate: Secondary | ICD-10-CM | POA: Diagnosis not present

## 2020-06-26 DIAGNOSIS — I255 Ischemic cardiomyopathy: Secondary | ICD-10-CM | POA: Diagnosis not present

## 2020-06-26 DIAGNOSIS — I5043 Acute on chronic combined systolic (congestive) and diastolic (congestive) heart failure: Secondary | ICD-10-CM | POA: Diagnosis not present

## 2020-06-26 DIAGNOSIS — I5033 Acute on chronic diastolic (congestive) heart failure: Secondary | ICD-10-CM | POA: Diagnosis not present

## 2020-06-26 DIAGNOSIS — I517 Cardiomegaly: Secondary | ICD-10-CM | POA: Diagnosis not present

## 2020-06-26 DIAGNOSIS — N1831 Chronic kidney disease, stage 3a: Secondary | ICD-10-CM | POA: Diagnosis not present

## 2020-06-26 LAB — BASIC METABOLIC PANEL
Anion gap: 10 (ref 5–15)
BUN: 16 mg/dL (ref 8–23)
CO2: 25 mmol/L (ref 22–32)
Calcium: 9.8 mg/dL (ref 8.9–10.3)
Chloride: 99 mmol/L (ref 98–111)
Creatinine, Ser: 1.2 mg/dL (ref 0.61–1.24)
GFR, Estimated: >60 mL/min (ref >60)
Glucose, Bld: 127 mg/dL — ABNORMAL HIGH (ref 70–99)
Potassium: 4 mmol/L (ref 3.5–5.1)
Sodium: 134 mmol/L — ABNORMAL LOW (ref 135–145)

## 2020-06-26 LAB — CBC
HCT: 48.1 % (ref 39.0–52.0)
Hemoglobin: 15.4 g/dL (ref 13.0–17.0)
MCH: 30 pg (ref 26.0–34.0)
MCHC: 32 g/dL (ref 30.0–36.0)
MCV: 93.8 fL (ref 80.0–100.0)
Platelets: 211 10*3/uL (ref 150–400)
RBC: 5.13 MIL/uL (ref 4.22–5.81)
RDW: 13.4 % (ref 11.5–15.5)
WBC: 8.1 10*3/uL (ref 4.0–10.5)
nRBC: 0 % (ref 0.0–0.2)

## 2020-06-26 LAB — TROPONIN I (HIGH SENSITIVITY)
Troponin I (High Sensitivity): 7 ng/L (ref <18)
Troponin I (High Sensitivity): 9 ng/L (ref <18)
Troponin I (High Sensitivity): 9 ng/L (ref <18)

## 2020-06-26 LAB — RESP PANEL BY RT-PCR (FLU A&B, COVID) ARPGX2
Influenza A by PCR: NEGATIVE
Influenza B by PCR: NEGATIVE
SARS Coronavirus 2 by RT PCR: NEGATIVE

## 2020-06-26 LAB — HEMOGLOBIN A1C
Hgb A1c MFr Bld: 5.4 % (ref 4.8–5.6)
Mean Plasma Glucose: 108.28 mg/dL

## 2020-06-26 MED ORDER — SODIUM CHLORIDE 0.9% FLUSH: 3.0000 mL | INTRAVENOUS | Status: DC | PRN

## 2020-06-26 MED ORDER — SODIUM CHLORIDE 0.9 % IV SOLN: 250.0000 mL | INTRAVENOUS | Status: DC | PRN

## 2020-06-26 MED ORDER — ENOXAPARIN SODIUM 40 MG/0.4ML ~~LOC~~ SOLN
40.0000 mg | SUBCUTANEOUS | Status: DC
  Administered 2020-06-27 – 2020-07-04 (×8): 40 mg via SUBCUTANEOUS
  Filled 2020-06-26 (×8): qty 0.4

## 2020-06-26 MED ORDER — LOSARTAN POTASSIUM 50 MG PO TABS
100.0000 mg | ORAL_TABLET | Freq: Every day | ORAL | Status: DC
  Administered 2020-06-27 – 2020-07-01 (×5): 100 mg via ORAL
  Filled 2020-06-26 (×5): qty 2

## 2020-06-26 MED ORDER — ALLOPURINOL 300 MG PO TABS
300.0000 mg | ORAL_TABLET | Freq: Every day | ORAL | Status: DC
  Administered 2020-06-27 – 2020-07-04 (×8): 300 mg via ORAL
  Filled 2020-06-26 (×8): qty 1

## 2020-06-26 MED ORDER — ACETAMINOPHEN 325 MG PO TABS
650.0000 mg | ORAL_TABLET | ORAL | Status: DC | PRN
  Administered 2020-06-28 – 2020-07-02 (×8): 650 mg via ORAL
  Filled 2020-06-26 (×10): qty 2

## 2020-06-26 MED ORDER — PANTOPRAZOLE SODIUM 40 MG PO TBEC
40.0000 mg | DELAYED_RELEASE_TABLET | Freq: Every day | ORAL | Status: DC
  Administered 2020-06-27 – 2020-07-04 (×8): 40 mg via ORAL
  Filled 2020-06-26 (×8): qty 1

## 2020-06-26 MED ORDER — ONDANSETRON HCL 4 MG/2ML IJ SOLN
4.0000 mg | Freq: Four times a day (QID) | INTRAMUSCULAR | Status: DC | PRN
  Administered 2020-06-27 – 2020-06-28 (×2): 4 mg via INTRAVENOUS
  Filled 2020-06-26 (×2): qty 2

## 2020-06-26 MED ORDER — FUROSEMIDE 10 MG/ML IJ SOLN
40.0000 mg | Freq: Three times a day (TID) | INTRAMUSCULAR | Status: DC
  Administered 2020-06-27 (×2): 40 mg via INTRAVENOUS
  Filled 2020-06-26 (×2): qty 4

## 2020-06-26 MED ORDER — ASPIRIN EC 81 MG PO TBEC
81.0000 mg | DELAYED_RELEASE_TABLET | Freq: Every day | ORAL | Status: DC
  Administered 2020-06-27 – 2020-07-04 (×8): 81 mg via ORAL
  Filled 2020-06-26 (×8): qty 1

## 2020-06-26 MED ORDER — SODIUM CHLORIDE 0.9% FLUSH
3.0000 mL | Freq: Two times a day (BID) | INTRAVENOUS | Status: DC
  Administered 2020-06-26 – 2020-07-04 (×16): 3 mL via INTRAVENOUS

## 2020-06-26 MED ORDER — AMLODIPINE BESYLATE 10 MG PO TABS
10.0000 mg | ORAL_TABLET | Freq: Every day | ORAL | Status: DC
  Administered 2020-06-27 – 2020-07-04 (×8): 10 mg via ORAL
  Filled 2020-06-26 (×8): qty 1

## 2020-06-26 MED ORDER — POTASSIUM CHLORIDE CRYS ER 20 MEQ PO TBCR
20.0000 meq | EXTENDED_RELEASE_TABLET | Freq: Two times a day (BID) | ORAL | Status: DC
  Administered 2020-06-27 – 2020-07-04 (×16): 20 meq via ORAL
  Filled 2020-06-26 (×16): qty 1

## 2020-06-26 MED ORDER — ATORVASTATIN CALCIUM 40 MG PO TABS
40.0000 mg | ORAL_TABLET | ORAL | Status: DC
  Administered 2020-07-03: 40 mg via ORAL
  Filled 2020-06-26: qty 1

## 2020-06-26 NOTE — ED Provider Notes (Signed)
[facility] EMERGENCY DEPARTMENT Provider Note   CSN: [id number] Arrival date & time: 06/26/20  1501     History Chief Complaint  Patient presents with  . Shortness of Breath    [name] is a 75 y.o. male.  HPI Patient presents with chest pain and shortness of breath.  Has had for close to the last week.  States that he does have an occasional cough with mild sputum production.  States some swelling in his legs.  States has been out of his fluid pill for a few days now.  But also is to had chest pain.  Is dull in his mid chest.  States he does not feel right now but for the last week he has not whenever he tries to have some exertion.  States if he walks a good amount he will get the pressure in his chest.  Has a history of 4 previous stents.  States this feels somewhat like that but that that time it was more like a heartburn.  He has had his COVID vaccines.  States had previously been on oxygen but not currently on it.    Past Medical History:  Diagnosis Date  . Arthritis    "left shoulder" (02/09/2015)  . Blind left eye   . CAD (coronary artery disease)   . Closed fracture of lateral portion of right tibial plateau 11/01/2017  . Complication of anesthesia    "they give me too much anesthesia & had to put me on life support for a little bit w/gallbladder OR"  . Coronary artery disease   . Former smoker   . GERD (gastroesophageal reflux disease)   . Gout   . History of bleeding ulcers   . History of gout   . History of peptic ulcer disease   . Hyperlipidemia   . Hypertension   . Hypertensive heart disease without CHF   . Kidney stones   . Lumbar disc disease   . Myocardial infarction [facility]) 1995; 2007  . Obesity   . OSA (obstructive sleep apnea)   . Pneumonia X 2  . Prostate cancer ([place])   . Pulmonary nodule   . Second degree Mobitz I AV block     Patient Active Problem List   Diagnosis Date Noted  . Allergic rhinitis 05/30/2019  . Second  degree AV block 05/17/2019  . Chest pain 07/12/2018  . Chronic respiratory failure with hypoxia ([place]) 07/12/2018  . Abnormal EKG 07/12/2018  . Hypertension 07/12/2018  . Generalized abdominal pain   . Closed fracture of medial plateau of right tibia   . Closed fracture of lateral portion of right tibial plateau 11/01/2017  . Asthma, chronic, unspecified asthma severity, with acute exacerbation 05/06/2017  . History of peptic ulcer disease   . Lumbar disc disease   . Heart block AV second degree   . Hyperlipidemia 09/21/2014  . Pulmonary nodule   . OSA (obstructive sleep apnea) 04/12/2009  . Obesity (BMI 30-39.9)   . CAD (coronary artery disease) 04/11/2009  . Hypertensive heart disease without CHF   . GERD     Past Surgical History:  Procedure Laterality Date  . APPENDECTOMY    . APPENDECTOMY  ~ 1985  . BACK SURGERY    . CARDIAC CATHETERIZATION N/A 02/08/2015   Procedure: Left Heart Cath and Coronary Angiography;  Surgeon: Leonie Man, MD;  Location: [place] CV LAB;  Service: Cardiovascular;  Laterality: N/A;  . CARDIAC CATHETERIZATION N/A 02/08/2015  Procedure: Coronary Stent Intervention;  Surgeon: Leonie Man, MD;  Location: [facility] CV LAB;  Service: Cardiovascular;  Laterality: N/A;  . CHOLECYSTECTOMY    . CORONARY ANGIOPLASTY WITH STENT PLACEMENT  ~ 1995; 2007   "1 stent; 2 stents"  . LAPAROSCOPIC CHOLECYSTECTOMY    . LEFT HEART CATH AND CORONARY ANGIOGRAPHY N/A 07/13/2018   Procedure: LEFT HEART CATH AND CORONARY ANGIOGRAPHY;  Surgeon: Martinique, Peter M, MD;  Location: [facility] CV LAB;  Service: Cardiovascular;  Laterality: N/A;  . LITHOTRIPSY  X 1  . LUMBAR LAMINECTOMY    . ORIF TIBIA PLATEAU Right 11/05/2017   Procedure: OPEN REDUCTION INTERNAL FIXATION (ORIF) TIBIAL PLATEAU;  Surgeon: Shona Needles, MD;  Location: [facility];  Service: Orthopedics;  Laterality: Right;  . PACEMAKER LEADLESS INSERTION N/A 05/17/2019   Procedure: PACEMAKER LEADLESS INSERTION;   Surgeon: Thompson Grayer, MD;  Location: [facility] CV LAB;  Service: Cardiovascular;  Laterality: N/A;  . POSTERIOR LUMBAR FUSION  2003?  . PROSTATE BIOPSY  2015       Family History  Problem Relation Age of Onset  . Hypertension Mother   . Coronary artery disease Mother   . Aneurysm Father   . Hypertension Father   . COPD Sister   . Sleep apnea Other        3 siblings    Social History   Tobacco Use  . Smoking status: Former Smoker    Packs/day: 3.00    Years: 35.00    Pack years: 105.00    Types: Cigarettes    Quit date: 06/17/1983    Years since quitting: 37.0  . Smokeless tobacco: Never Used  . Tobacco comment: "quit smoking cigarettes in 1985  Substance Use Topics  . Alcohol use: Yes    Alcohol/week: 0.0 standard drinks    Comment: "I drank a whole lot when I was younger; nothing since 1990"  . Drug use: No    Home Medications Prior to Admission medications   Medication Sig Start Date End Date Taking? Authorizing Provider  acetaminophen (TYLENOL) 500 MG tablet Take 500 mg by mouth every 6 (six) hours as needed for moderate pain.    [provider]  albuterol (VENTOLIN HFA) 108 (90 Base) MCG/ACT inhaler Inhale 1-2 puffs into the lungs every 6 (six) hours as needed for wheezing or shortness of breath.    [provider]  allopurinol (ZYLOPRIM) 300 MG tablet Take 300 mg by mouth Daily.  11/07/11   [provider]  amLODipine (NORVASC) 10 MG tablet Take 10 mg by mouth Daily.  11/07/11   [provider]  aspirin EC 81 MG EC tablet Take 1 tablet (81 mg total) by mouth daily. 02/10/15   Jacolyn Reedy, MD  atorvastatin (LIPITOR) 20 MG tablet Take 20 mg by mouth once a week.     [provider]  Cholecalciferol (DIALYVITE VITAMIN D 5000) 125 MCG (5000 UT) capsule Take 5,000 Units by mouth 3 (three) times a week.     [provider]  furosemide (LASIX) 20 MG tablet Take 1 tablet by mouth once daily 06/22/20   Hilty, Nadean Corwin, MD  hydrochlorothiazide (HYDRODIURIL) 25 MG tablet Take 25 mg by mouth daily. 06/07/18   [provider]  losartan (COZAAR) 100 MG tablet Take 100 mg by mouth daily. 06/07/18   [provider]  ondansetron (ZOFRAN) 8 MG tablet TAKE 1 2 TO 1 (ONE HALF TO ONE) TABLET BY MOUTH EVERY 8 HOURS AS  NEEDED FOR NAUSEA 06/03/19   [provider]  pantoprazole (PROTONIX) 40 MG tablet Take 40 mg by mouth daily. 06/07/18   [provider]    Allergies    Prednisone  Review of Systems   Review of Systems  Constitutional: Positive for fatigue. Negative for appetite change and fever.  HENT: Negative for congestion.   Respiratory: Positive for cough and shortness of breath.   Cardiovascular: Positive for chest pain and leg swelling.  Gastrointestinal: Negative for abdominal distention.  Endocrine: Negative for polyuria.  Genitourinary: Negative for flank pain.  Musculoskeletal: Positive for back pain.  Skin: Negative for rash.  Neurological: Negative for weakness.  Psychiatric/Behavioral: Negative for confusion.    Physical Exam Updated Vital Signs BP (!) 175/86 (BP Location: Left Arm)   Pulse 76   Temp 98.4 F (36.9 C) (Oral)   Resp 16   SpO2 96%   Physical Exam Vitals and nursing note reviewed.  HENT:     Head: Normocephalic.  Cardiovascular:     Rate and Rhythm: Normal rate and regular rhythm.  Pulmonary:     Breath sounds: No wheezing, rhonchi or rales.  Chest:     Chest wall: No tenderness.  Abdominal:     Tenderness: There is no abdominal tenderness.  Musculoskeletal:     Cervical back: Neck supple.     Right lower leg: Edema present.     Left lower leg: Edema present.     Comments: Pitting edema bilateral lower extremities, but worse on left.  Skin:    Capillary Refill: Capillary refill takes less than 2 seconds.  Neurological:     Mental Status: He is alert and oriented to person, place, and time.     ED Results / Procedures /  Treatments   Labs (all labs ordered are listed, but only abnormal results are displayed) Labs Reviewed  BASIC METABOLIC PANEL - Abnormal; Notable for the following components:      Result Value   Sodium 134 (*)    Glucose, Bld 127 (*)    All other components within normal limits  RESP PANEL BY RT-PCR (FLU A&B, COVID) ARPGX2  CBC  TROPONIN I (HIGH SENSITIVITY)  TROPONIN I (HIGH SENSITIVITY)    EKG EKG Interpretation  Date/Time:  Tuesday June 26 2020 15:13:46 EST Ventricular Rate:  95 PR Interval:    QRS Duration: 146 QT Interval:  400 QTC Calculation: 502 R Axis:   -109 Text Interpretation: Ventricular-paced rhythm Abnormal ECG Confirmed by Davonna Belling [phone]) on 06/26/2020 9:56:10 PM   Radiology DG Chest Portable 1 View  Result Date: 06/26/2020 CLINICAL DATA:  Chest pain. EXAM: PORTABLE CHEST 1 VIEW COMPARISON:  May 18, 2019. FINDINGS: Stable cardiomegaly. No pneumothorax or pleural effusion is noted. Right lung is clear. Minimal left basilar subsegmental atelectasis is noted. Bony thorax is unremarkable. IMPRESSION: Minimal left basilar subsegmental atelectasis. Electronically Signed   By: Marijo Conception M.D.   On: 06/26/2020 15:45    Procedures Procedures (including critical care time)  Medications Ordered in ED Medications - No data to display  ED Course  I have reviewed the triage vital signs and the nursing notes.  Pertinent labs & imaging results that were available during my care of the patient were reviewed by me and considered in my medical decision making (see chart for details).    MDM Rules/Calculators/A&P  Patient with chest pain shortness of breath.  Chest x-ray does not show pneumonia.  No pneumothorax.  EKG is paced.  Cardiac enzymes negative x2 but has known coronary artery disease and has had exertional chest pain over around the last week.  States if he walks a distance now it would come on.  With this it is  worrisome for an unstable angina.  Patient has a negative COVID test.  Will discuss with cardiology for admission.  CRITICAL CARE Performed by: Davonna Belling Total critical care time: 30 minutes Critical care time was exclusive of separately billable procedures and treating other patients. Critical care was necessary to treat or prevent imminent or life-threatening deterioration. Critical care was time spent personally by me on the following activities: development of treatment plan with patient and/or surrogate as well as nursing, discussions with consultants, evaluation of patient's response to treatment, examination of patient, obtaining history from patient or surrogate, ordering and performing treatments and interventions, ordering and review of laboratory studies, ordering and review of radiographic studies, pulse oximetry and re-evaluation of patient's condition.  Final Clinical Impression(s) / ED Diagnoses Final diagnoses:  Unstable angina [facility])    Rx / DC Orders ED Discharge Orders    None       Davonna Belling, MD 06/26/20 2209

## 2020-06-26 NOTE — ED Triage Notes (Signed)
Pt reports he is here today due to chest pain, sob. Pt reports he would also like to get tested for covid. Pt reports x1 week he developed s/s.

## 2020-06-27 ENCOUNTER — Inpatient Hospital Stay (HOSPITAL_COMMUNITY): Payer: Medicare HMO

## 2020-06-27 DIAGNOSIS — I10 Essential (primary) hypertension: Secondary | ICD-10-CM

## 2020-06-27 DIAGNOSIS — I255 Ischemic cardiomyopathy: Secondary | ICD-10-CM

## 2020-06-27 DIAGNOSIS — I5033 Acute on chronic diastolic (congestive) heart failure: Secondary | ICD-10-CM

## 2020-06-27 DIAGNOSIS — I25110 Atherosclerotic heart disease of native coronary artery with unstable angina pectoris: Secondary | ICD-10-CM

## 2020-06-27 DIAGNOSIS — R079 Chest pain, unspecified: Secondary | ICD-10-CM

## 2020-06-27 LAB — BASIC METABOLIC PANEL
Anion gap: 9 (ref 5–15)
BUN: 13 mg/dL (ref 8–23)
CO2: 30 mmol/L (ref 22–32)
Calcium: 9.9 mg/dL (ref 8.9–10.3)
Chloride: 99 mmol/L (ref 98–111)
Creatinine, Ser: 1.21 mg/dL (ref 0.61–1.24)
GFR, Estimated: >60 mL/min (ref >60)
Glucose, Bld: 95 mg/dL (ref 70–99)
Potassium: 3.7 mmol/L (ref 3.5–5.1)
Sodium: 138 mmol/L (ref 135–145)

## 2020-06-27 LAB — ECHOCARDIOGRAM COMPLETE
Area-P 1/2: 2.8 cm2
Height: 74 in
S' Lateral: 3.4 cm
Weight: 4917.14 oz

## 2020-06-27 LAB — LIPID PANEL
Cholesterol: 154 mg/dL (ref 0–200)
HDL: 30 mg/dL — ABNORMAL LOW (ref >40)
LDL Cholesterol: 83 mg/dL (ref 0–99)
Total CHOL/HDL Ratio: 5.1 RATIO
Triglycerides: 204 mg/dL — ABNORMAL HIGH (ref <150)
VLDL: 41 mg/dL — ABNORMAL HIGH (ref 0–40)

## 2020-06-27 LAB — MAGNESIUM
Magnesium: 1.9 mg/dL (ref 1.7–2.4)
Magnesium: 2 mg/dL (ref 1.7–2.4)

## 2020-06-27 LAB — TSH: TSH: 3.439 u[IU]/mL (ref 0.350–4.500)

## 2020-06-27 LAB — MRSA PCR SCREENING: MRSA by PCR: NEGATIVE

## 2020-06-27 LAB — BRAIN NATRIURETIC PEPTIDE: B Natriuretic Peptide: 264 pg/mL — ABNORMAL HIGH (ref 0.0–100.0)

## 2020-06-27 MED ORDER — FUROSEMIDE 10 MG/ML IJ SOLN
40.0000 mg | Freq: Two times a day (BID) | INTRAMUSCULAR | Status: DC
  Administered 2020-06-28 – 2020-07-02 (×9): 40 mg via INTRAVENOUS
  Filled 2020-06-27 (×10): qty 4

## 2020-06-27 MED ORDER — GUAIFENESIN-DM 100-10 MG/5ML PO SYRP
15.0000 mL | ORAL_SOLUTION | ORAL | Status: DC | PRN
  Administered 2020-06-27: 15 mL via ORAL
  Filled 2020-06-27: qty 15

## 2020-06-27 MED ORDER — MAGNESIUM HYDROXIDE 400 MG/5ML PO SUSP: 30.0000 mL | Freq: Every day | ORAL | Status: DC | PRN

## 2020-06-27 MED ORDER — SPIRONOLACTONE 25 MG PO TABS
25.0000 mg | ORAL_TABLET | Freq: Every day | ORAL | Status: DC
  Administered 2020-06-27 – 2020-07-04 (×8): 25 mg via ORAL
  Filled 2020-06-27 (×8): qty 1

## 2020-06-27 MED ORDER — POLYETHYLENE GLYCOL 3350 17 G PO PACK
17.0000 g | PACK | Freq: Every day | ORAL | Status: DC
  Administered 2020-06-27 – 2020-07-04 (×6): 17 g via ORAL
  Filled 2020-06-27 (×8): qty 1

## 2020-06-27 NOTE — Plan of Care (Signed)
  Problem: Education: Goal: Knowledge of General Education information will improve Description: Including pain rating scale, medication(s)/side effects and non-pharmacologic comfort measures Outcome: Progressing   Problem: Clinical Measurements: Goal: Ability to maintain clinical measurements within normal limits will improve Outcome: Progressing   Problem: Clinical Measurements: Goal: Diagnostic test results will improve Outcome: Progressing   Problem: Clinical Measurements: Goal: Cardiovascular complication will be avoided Outcome: Progressing   Problem: Activity: Goal: Risk for activity intolerance will decrease Outcome: Progressing   Problem: Nutrition: Goal: Adequate nutrition will be maintained Outcome: Progressing   Problem: Pain Managment: Goal: General experience of comfort will improve Outcome: Progressing

## 2020-06-27 NOTE — Progress Notes (Signed)
Physical Therapy Evaluation   Clinical Impression:    PTA pt living with wife in mobile home with ramped entry. Pt reports he is caregiver for his wife who has dementia. Pt reports using a cane for ambulation limited community distances, is independent in his own ADLs and iADLs. Pt reports he still works and when he is at work his son comes over to watch his wife. Pt is limited in safe mobility by increased HR 133 bpm with exertion  in presence of decreased strength, balance and endurance. Pt is supervision for bed mobility, and min guard for transfer to use urinal. Pt with difficulty in static stand and braces against bed. Session limited by need for ECHO, pt with lateral stepping to HoB to move hips up bed requiring min A for steadying. PT recommending HHPT at discharge to improve mobility. PT will continue to follow acutely.     06/27/20 1000  PT Visit Information  Last PT Received On 06/27/20  Assistance Needed +1  History of Present Illness 75 year old male with history of CAD s/p PCI LAD/RCA/LCx, HFpEF, 2nd degree heart block s/p AV-Micra ICP presenting with shortness of breath and chest pain especially with ambulation as well as peripheral edema. Admitted for acute on chronic diastolic heart failure, chest pain and hypertension.  Precautions  Precautions Fall  Precaution Comments 1x in last 6 months had fall with broken LE  Restrictions  Weight Bearing Restrictions No  Home Living  Family/patient expects to be discharged to: Private residence  Living Arrangements Spouse/significant other (pt is caregiver for wife with dementia)  Available Help at Discharge Family;Available PRN/intermittently  Type of Home Mobile home  Home Access Ramped entrance  [place] One level  Bathroom Shower/Tub Walk-in shower  [place] seat - built in;Cane - single point;Walker - 2 wheels  Prior Function  Level of Independence Independent with assistive  device(s)  Comments walks with cane in community, works 6 hrs/day, 2wk/month  Psychologist, clinical  Cognition  Arousal/Alertness Awake/alert  Behavior During Therapy WFL for tasks assessed/performed  Overall Cognitive Status Within Functional Limits for tasks assessed  Upper Extremity Assessment  Upper Extremity Assessment Generalized weakness  Lower Extremity Assessment  Lower Extremity Assessment RLE deficits/detail;LLE deficits/detail  RLE Deficits / Details historic R tibial plateau fx, knee lacking full ext/flex, strength grossly 3/5  RLE Sensation decreased light touch (feet s/p distant frost bite)  LLE Deficits / Details ROM WFL, strength grossly 3+/5  LLE Sensation decreased light touch (s/p distant frostbite)  Bed Mobility  Overal bed mobility Needs Assistance  Bed Mobility Supine to Sit;Sit to Supine  Supine to sit Supervision  Sit to supine Supervision  General bed mobility comments supervision for safety, increased time and effort with heavy use of bedrails to come to EoB and return to supine  Transfers  Overall transfer level Needs assistance  Equipment used None  Transfers Sit to/from Stand  Sit to Stand Min guard  General transfer comment close min guard for safety, pt reports dizziness with standing, which is his usual. pt utilizes posterior lean with LE against bed to stabilize, x 1 for using urinal, x1 for stepping to HoB  Ambulation/Gait  Ambulation/Gait assistance Min assist  Gait Distance (Feet) 3 Feet  Assistive device None  Gait Pattern/deviations Step-to pattern  General Gait Details min A for steadying with lateral stepping to HoB, ECHO tech waiting to perform test so further ambulation deferred  Gait velocity slowed  Gait velocity interpretation <1.31 ft/sec, indicative of household ambulator  Balance  Overall balance assessment Needs assistance  Sitting-balance support Feet supported;No upper extremity supported  Sitting balance-Leahy  Scale Fair  Standing balance support No upper extremity supported;During functional activity  Standing balance-Leahy Scale Poor  Standing balance comment utilizes side of bed to steady without UE support  General Comments  General comments (skin integrity, edema, etc.) BP 165/75 in supine, resting HR 62 bpm, increase in HR to 133 bpm to stand and use urinal, BP after mobility 169/93  PT - End of Session  Activity Tolerance Other (comment);Patient tolerated treatment well (Treatment limited by need for ECHO)  Patient left in bed;Other (comment) (ECHO tech in room)  Nurse Communication Mobility status  PT Assessment  PT Recommendation/Assessment Patient needs continued PT services  PT Visit Diagnosis Unsteadiness on feet (R26.81);Other abnormalities of gait and mobility (R26.89);Muscle weakness (generalized) (M62.81);History of falling (Z91.81);Repeated falls (R29.6);Difficulty in walking, not elsewhere classified (R26.2);Dizziness and giddiness (R42)  PT Problem List Decreased strength;Decreased activity tolerance;Decreased balance;Decreased mobility;Decreased safety awareness;Cardiopulmonary status limiting activity  PT Plan  PT Frequency (ACUTE ONLY) Min 3X/week  PT Treatment/Interventions (ACUTE ONLY) DME instruction;Gait training;Functional mobility training;Therapeutic activities;Therapeutic exercise;Balance training;Cognitive remediation;Patient/family education  AM-PAC PT "6 Clicks" Mobility Outcome Measure (Version 2)  Help needed turning from your back to your side while in a flat bed without using bedrails? 4  Help needed moving from lying on your back to sitting on the side of a flat bed without using bedrails? 4  Help needed moving to and from a bed to a chair (including a wheelchair)? 3  Help needed standing up from a chair using your arms (e.g., wheelchair or bedside chair)? 3  Help needed to walk in hospital room? 3  Help needed climbing 3-5 steps with a railing?  2  6 Click  Score 19  Consider Recommendation of Discharge To: Home with [facility]  PT Recommendation  Recommendations for Other Services OT consult  Follow Up Recommendations Home health PT;Supervision for mobility/OOB  PT equipment None recommended by PT (has necessary equipment)  Individuals Consulted  Consulted and Agree with Results and Recommendations Patient  Acute Rehab PT Goals  Patient Stated Goal go home and take care of wife  PT Goal Formulation With patient  Time For Goal Achievement 07/11/20  Potential to Achieve Goals Good  PT Time Calculation  PT Start Time (ACUTE ONLY) 1018  PT Stop Time (ACUTE ONLY) 1044  PT Time Calculation (min) (ACUTE ONLY) 26 min  PT General Charges  $$ ACUTE PT VISIT 1 Visit  PT Evaluation  $PT Eval Moderate Complexity 1 Mod  PT Treatments  $Therapeutic Activity 8-22 mins  Written Expression  Dominant Hand Right   Tanganika Barradas B. Migdalia Dk PT, DPT Acute Rehabilitation Services Pager [phone] Office [phone]

## 2020-06-27 NOTE — Plan of Care (Signed)
  Problem: Education: Goal: Knowledge of General Education information will improve Description: Including pain rating scale, medication(s)/side effects and non-pharmacologic comfort measures Outcome: Progressing   Problem: Health Behavior/Discharge Planning: Goal: Ability to manage health-related needs will improve Outcome: Progressing   Problem: Clinical Measurements: Goal: Will remain free from infection Outcome: Progressing   Problem: Clinical Measurements: Goal: Respiratory complications will improve Outcome: Progressing   Problem: Nutrition: Goal: Adequate nutrition will be maintained Outcome: Progressing   Problem: Pain Managment: Goal: General experience of comfort will improve Outcome: Progressing   Problem: Safety: Goal: Ability to remain free from injury will improve Outcome: Progressing

## 2020-06-27 NOTE — H&P (Signed)
Cardiology History & Physical    Patient ID: [name] MRN: [id number], DOB/AGE: 76-May-1947   Admit date: 06/26/2020  Primary Physician: Maury Dus, MD Primary Cardiologist: Pixie Casino, MD  Patient Profile    75 year old male with history of CAD s/p PCI LAD/RCA/LCx, HFpEF, 2nd degree heart block s/p AV-Micra presenting with shortness of breath and chest pain.  History of Present Illness    75 year old male with history as above who came to the ED with his wife because he's been experiencing some shortness of breath and chest pain for the last several days.  He reports that he has multiple family members with COVID19 pneumonia and wanted to make sure that he wasn't infected as well.  Whereas he's generally able to go about his activities of daily living without difficulty he's noticed that over the last week, with just minimal exertion, he will have shortness of breath.  With sustained exertion he will notice some chest tightness across his sternum and to his left shoulder that abates with rest.  He's noticed worsening orthopnea, PND, and peripheral edema but denies palpitations.  His abdominal girth has increased.  He reports that he takes furosemide 20 mg every other day but his prescription ran out about a week ago and he hasn't refilled subsequently.  He is prescribed once daily per review of medicaitons.  Past Medical History   Past Medical History:  Diagnosis Date  . Arthritis    "left shoulder" (02/09/2015)  . Blind left eye   . CAD (coronary artery disease)   . Closed fracture of lateral portion of right tibial plateau 11/01/2017  . Complication of anesthesia    "they give me too much anesthesia & had to put me on life support for a little bit w/gallbladder OR"  . Coronary artery disease   . Former smoker   . GERD (gastroesophageal reflux disease)   . Gout   . History of bleeding ulcers   . History of gout   . History of peptic ulcer disease   .  Hyperlipidemia   . Hypertension   . Hypertensive heart disease without CHF   . Kidney stones   . Lumbar disc disease   . Myocardial infarction [facility]) 1995; 2007  . Obesity   . OSA (obstructive sleep apnea)   . Pneumonia X 2  . Prostate cancer ([place])   . Pulmonary nodule   . Second degree Mobitz I AV block     Past Surgical History:  Procedure Laterality Date  . APPENDECTOMY    . APPENDECTOMY  ~ 1985  . BACK SURGERY    . CARDIAC CATHETERIZATION N/A 02/08/2015   Procedure: Left Heart Cath and Coronary Angiography;  Surgeon: Leonie Man, MD;  Location: [place] [facility];  Service: Cardiovascular;  Laterality: N/A;  . CARDIAC CATHETERIZATION N/A 02/08/2015   Procedure: Coronary Stent Intervention;  Surgeon: Leonie Man, MD;  Location: [place] [facility];  Service: Cardiovascular;  Laterality: N/A;  . CHOLECYSTECTOMY    . CORONARY ANGIOPLASTY WITH STENT PLACEMENT  ~ 1995; 2007   "1 stent; 2 stents"  . LAPAROSCOPIC CHOLECYSTECTOMY    . LEFT HEART CATH AND CORONARY ANGIOGRAPHY N/A 07/13/2018   Procedure: LEFT HEART CATH AND CORONARY ANGIOGRAPHY;  Surgeon: Martinique, Peter M, MD;  Location: [place] [facility];  Service: Cardiovascular;  Laterality: N/A;  . LITHOTRIPSY  X 1  . LUMBAR LAMINECTOMY    . ORIF TIBIA PLATEAU Right 11/05/2017  Procedure: OPEN REDUCTION INTERNAL FIXATION (ORIF) TIBIAL PLATEAU;  Surgeon: Shona Needles, MD;  Location: [place];  Service: Orthopedics;  Laterality: Right;  . PACEMAKER LEADLESS INSERTION N/A 05/17/2019   Procedure: PACEMAKER LEADLESS INSERTION;  Surgeon: Thompson Grayer, MD;  Location: [place] [facility];  Service: Cardiovascular;  Laterality: N/A;  . POSTERIOR LUMBAR FUSION  2003?  . PROSTATE BIOPSY  2015     Allergies Allergies  Allergen Reactions  . Prednisone Swelling    Home Medications    Prior to Admission medications   Medication Sig Start Date End Date Taking? Authorizing Provider  acetaminophen (TYLENOL) 500 MG tablet Take 500 mg by  mouth every 6 (six) hours as needed for moderate pain.   Yes [provider]  albuterol (VENTOLIN HFA) 108 (90 Base) MCG/ACT inhaler Inhale 1-2 puffs into the lungs every 6 (six) hours as needed for wheezing or shortness of breath.   Yes [provider]  allopurinol (ZYLOPRIM) 300 MG tablet Take 300 mg by mouth Daily.  11/07/11  Yes [provider]  amLODipine (NORVASC) 10 MG tablet Take 10 mg by mouth Daily.  11/07/11  Yes [provider]  Apple Cider Vinegar 300 MG TABS Take 2 tablets by mouth daily.   Yes [provider]  aspirin EC 81 MG EC tablet Take 1 tablet (81 mg total) by mouth daily. 02/10/15  Yes Jacolyn Reedy, MD  diphenhydrAMINE (BENADRYL) 25 MG tablet Take 50 mg by mouth 2 (two) times daily as needed for allergies.   Yes [provider]  furosemide (LASIX) 20 MG tablet Take 1 tablet by mouth once daily 06/22/20  Yes Hilty, Nadean Corwin, MD  hydrochlorothiazide (HYDRODIURIL) 25 MG tablet Take 25 mg by mouth daily. 06/07/18  Yes [provider]  losartan (COZAAR) 100 MG tablet Take 100 mg by mouth daily. 06/07/18  Yes [provider]  ondansetron (ZOFRAN) 8 MG tablet Take 4-8 mg by mouth every 8 (eight) hours as needed for nausea or vomiting. 06/03/19  Yes [provider]  pantoprazole (PROTONIX) 40 MG tablet Take 40 mg by mouth daily. 06/07/18  Yes [provider]    Family History    Family History  Problem Relation Age of Onset  . Hypertension Mother   . Coronary artery disease Mother   . Aneurysm Father   . Hypertension Father   . COPD Sister   . Sleep apnea Other        3 siblings   He indicated that his mother is deceased. He indicated that his father is deceased. He indicated that the status of his sister is unknown. He indicated that his brother is deceased. He indicated that the status of his other is unknown.   Social History    Social History   Socioeconomic History  .  Marital status: Married    Spouse name: Not on file  . Number of children: 2  . Years of education: Not on file  . Highest education level: Not on file  Occupational History  . Occupation: retired  Tobacco Use  . Smoking status: Former Smoker    Packs/day: 3.00    Years: 35.00    Pack years: 105.00    Types: Cigarettes    Quit date: 06/17/1983    Years since quitting: 37.0  . Smokeless tobacco: Never Used  . Tobacco comment: "quit smoking cigarettes in 1985  Substance and Sexual Activity  . Alcohol use: Yes    Alcohol/week: 0.0 standard drinks  Comment: "I drank a whole lot when I was younger; nothing since 1990"  . Drug use: No  . Sexual activity: Never  Other Topics Concern  . Not on file  Social History Narrative   ** Merged History Encounter **       Social Determinants of Health   Financial Resource Strain: Not on file  Food Insecurity: Not on file  Transportation Needs: Not on file  Physical Activity: Not on file  Stress: Not on file  Social Connections: Not on file  Intimate Partner Violence: Not on file     Review of Systems    General:  No chills, fever, night sweats or weight changes.  Cardiovascular:  No chest pain, dyspnea on exertion, edema, orthopnea, palpitations, paroxysmal nocturnal dyspnea. Dermatological: No rash, lesions/masses Respiratory: No cough, dyspnea Urologic: No hematuria, dysuria Abdominal:   No nausea, vomiting, diarrhea, bright red blood per rectum, melena, or hematemesis Neurologic:  No visual changes, wkns, changes in mental status. All other systems reviewed and are otherwise negative except as noted above.  Physical Exam    BP (!) 141/69 (BP Location: Left Arm)   Pulse 66   Temp (!) 97.5 F (36.4 C) (Oral)   Resp 20   Ht 6\' 2"  (1.88 m)   Wt (!) 139.4 kg   SpO2 92%   BMI 39.46 kg/m  General: Alert, NAD HEENT: Normal  Neck: No bruits.  JVP to 11 cm H20 Lungs:  Resp regular and unlabored, CTA bilaterally. Heart:  Regular rhythm, no s3, s4, or murmurs. Abdomen: Soft, non-tender, +distended, BS +.  Extremities: Warm.  2+ bilateral LE edema to the thigh. Psych: Normal affect. Neuro: Alert and oriented. No gross focal deficits. No abnormal movements.  Labs    Troponin I 7, 9, 9 BNP 264, highest in our system GFR > 60 Hematology wnl  Radiology Studies    DG Chest Portable 1 View  Result Date: 06/26/2020 CLINICAL DATA:  Chest pain. EXAM: PORTABLE CHEST 1 VIEW COMPARISON:  May 18, 2019. FINDINGS: Stable cardiomegaly. No pneumothorax or pleural effusion is noted. Right lung is clear. Minimal left basilar subsegmental atelectasis is noted. Bony thorax is unremarkable. IMPRESSION: Minimal left basilar subsegmental atelectasis. Electronically Signed   By: Marijo Conception M.D.   On: 06/26/2020 15:45    ECG & Cardiac Imaging    ECG personally reviewed and shows sinus rhythm with atrial sensing and appropriate ventricular pacing (has AV micra).  LHC 07/13/18 1. Single vessel occlusive CAD involving the distal LCx. 2. Patent stents in the LAD, RCA and LCx 3. Normal LV function 4. Mildly elevated LVEDP Medically managed  TTE 03/2017 - Left ventricle: The cavity size was normal. There was moderate  concentric hypertrophy. Systolic function was normal. The  estimated ejection fraction was in the range of 60% to 65%. Wall  motion was normal; there were no regional wall motion  abnormalities.  - Aortic valve: Transvalvular velocity was within the normal range.  There was no stenosis. There was no regurgitation.  - Mitral valve: Transvalvular velocity was within the normal range.  There was no evidence for stenosis. There was trivial  regurgitation.  - Left atrium: The atrium was moderately dilated.  - Right ventricle: The cavity size was normal. Wall thickness was  normal. Systolic function was normal.  - Tricuspid valve: There was mild regurgitation.  - Pulmonary arteries:  Systolic pressure was within the normal  range. PA peak pressure: 36 mm Hg (S).  - Pericardium,  extracardiac: A trivial pericardial effusion was  identified.    Assessment & Plan    75 year old male on whom we were consulted by ED for unstable angina.  He endorses shortness of breath and chest tightness with ambulation but, with low high sensitivity troponin and volume overload on exam favor exacerbation of his underlying heart failure with preserved ejection fraction.  Pertinent comorbidities include 2nd degree heart block with leadless pacemaker and coronary artery disease with multiple prior PCI.  Plan for admission with diuresis to observe for symptom improvement.  Repeat echocardiogram to ensure no new systolic dysfunction.  If he does have systolic dysfunction, may have to give consideration to pacemaker mediated dysfunction as, on review of recent device check, he's 97% RV-paced.  Problem list Acute on chronic diastolic heart failure Chest pain Coronary artery disease s/p multiple prior PCI Obesity Hypertension Hyperlipidemia  Plan #Acute on chronic diastolic heart failure.  Precipitant medication nonadherence. - Lasix 40 mg IV TID and evaluate response. Suspect he will need increased dosing as an outpatient. - Start spironolactone 25 mg daily - Can price check SGLT2i but suspect he would benefit  #Chest pain #Coronary artery disease - Continue aspirin - Increase atorvastatin to 40 mg, high intensity w/ histoyr CAD  #Hypertension - Continue losartan 100 mg daily - Continue amlodipine 10 mg daily - Start spironolactone 25 mg daily - Stopped HCTZ w/ IV loop diuretic; favor optimizing loop diuretic dosing prior to discharge.   Nutrition: Low sodium DVT ppx: Enoxaparin GI ppx: None indicated Advanced Care Planning: Full code  Signed, Delight Hoh, MD 06/27/2020, 3:15 AM

## 2020-06-27 NOTE — Progress Notes (Addendum)
Progress Note  Patient Name: [name] Date of Encounter: 06/27/2020  [facility] Cardiologist: Pixie Casino, MD    Subjective   75 year old gentleman with a history of coronary artery disease, chronic diastolic congestive heart failure, second-degree heart block status post leadless pacemaker.  The patient only takes his furosemide 20 mg every other day or so.  His prescription was written for daily dosing.  He ran out of his furosemide about a week ago.  He has noticed increasing shortness of breath over this past week.  He is also noted some chest pain and pressure when he walks quickly.  I am concerned about this chest pain and pressure being angina.  He was given IV Lasix.  He is already feeling somewhat better.  Inpatient Medications    Scheduled Meds: . allopurinol  300 mg Oral Daily  . amLODipine  10 mg Oral Daily  . aspirin EC  81 mg Oral Daily  . [START ON 07/03/2020] atorvastatin  40 mg Oral Weekly  . enoxaparin (LOVENOX) injection  40 mg Subcutaneous Q24H  . furosemide  40 mg Intravenous BID  . losartan  100 mg Oral Daily  . pantoprazole  40 mg Oral Daily  . potassium chloride  20 mEq Oral BID  . sodium chloride flush  3 mL Intravenous Q12H  . spironolactone  25 mg Oral Daily   Continuous Infusions: . sodium chloride     PRN Meds: sodium chloride, acetaminophen, magnesium hydroxide, ondansetron (ZOFRAN) IV, sodium chloride flush   Vital Signs    Vitals:   06/27/20 0030 06/27/20 0132 06/27/20 0409 06/27/20 0758  BP: (!) 151/70 (!) 141/69 131/78 (!) 168/87  Pulse:  66 64 66  Resp: (!) 26 20 20 15   Temp:  (!) 97.5 F (36.4 C) (!) 97.4 F (36.3 C) 97.6 F (36.4 C)  TempSrc:  Oral Oral Oral  SpO2: 96% 92% 91% 90%  Weight:  (!) 139.4 kg    Height:  6\' 2"  (1.88 m)      Intake/Output Summary (Last 24 hours) at 06/27/2020 1013 Last data filed at 06/27/2020 0900 Gross per 24 hour  Intake 240 ml  Output 2020 ml  Net -1780 ml   Last 3 Weights  06/27/2020 02/06/2020 09/05/2019  Weight (lbs) 307 lb 5.1 oz 300 lb 302 lb 12.8 oz  Weight (kg) 139.4 kg 136.079 kg 137.349 kg      Telemetry    NSR  - Personally Reviewed  ECG     NSR  - Personally Reviewed  Physical Exam   GEN:  Elderly male,  Morbidly obese,  NAD    Neck: No JVD Cardiac: RRR, no murmurs, rubs, or gallops.  Respiratory: Clear to auscultation bilaterally. GI: Soft, nontender, non-distended  MS: No edema; No deformity. Neuro:  Nonfocal  Psych: Normal affect   Labs    High Sensitivity Troponin:   Recent Labs  Lab 06/26/20 1511 06/26/20 1711 06/26/20 2238  TROPONINIHS 7 9 9       Chemistry Recent Labs  Lab 06/26/20 1511 06/27/20 0500  NA 134* 138  K 4.0 3.7  CL 99 99  CO2 25 30  GLUCOSE 127* 95  BUN 16 13  CREATININE 1.20 1.21  CALCIUM 9.8 9.9  GFRNONAA >60 >60  ANIONGAP 10 9     Hematology Recent Labs  Lab 06/26/20 1511  WBC 8.1  RBC 5.13  HGB 15.4  HCT 48.1  MCV 93.8  MCH 30.0  MCHC 32.0  RDW 13.4  PLT 211    BNP Recent Labs  Lab 06/26/20 2343  BNP 264.0*     DDimer No results for input(s): DDIMER in the last 168 hours.   Radiology    DG Chest Portable 1 View  Result Date: 06/26/2020 CLINICAL DATA:  Chest pain. EXAM: PORTABLE CHEST 1 VIEW COMPARISON:  May 18, 2019. FINDINGS: Stable cardiomegaly. No pneumothorax or pleural effusion is noted. Right lung is clear. Minimal left basilar subsegmental atelectasis is noted. Bony thorax is unremarkable. IMPRESSION: Minimal left basilar subsegmental atelectasis. Electronically Signed   By: Marijo Conception M.D.   On: 06/26/2020 15:45    Cardiac Studies      Patient Profile     75 y.o. male with hx of CAD presents with increasing dyspnea and chest pressure after running out of his lasix for the past week   Assessment & Plan    1. acute on chronic diastolic congestive heart failure: The patient has known chronic diastolic heart failure.  He supposed be on Lasix every  day.  He only takes his Lasix every other day but then ran out last week.  I suspect a lot of this heart failure is due to medical noncompliance.  Agree with getting an echocardiogram to make sure that he is not developed systolic heart failure as well.  He was written for IV Lasix 3 times a day.  I think we should decrease that to twice a day.  We will start spironolactone 25 mg a day.  Hydrochlorothiazide has been stopped.  I have counseled him on avoiding high salt foods.  He still eats a very high salt diet including bacon and sausage and hot dogs on a regular basis. Apparently his wife has worsening dementia and he is having to take care of her.  He would definitely benefit from nutritional consultation.  2.  Chest pressure: He describes exertional chest pressure for the past 2 weeks or so.  I am concerned that this might be an anginal equivalent.  It may be all related to volume overload and acute diastolic heart failure but I think he will need an ischemic work-up either later in the hospitalization or perhaps as an outpatient.  His last heart catheterization was January 2020.  He has a mid RCA stent that is widely patent.  He has a distal RCA stenosis of 30%.  The proximal LAD has a 35% stenosis.  Stents in the proximal circumflex and mid circumflex artery are widely patent.  The distal circumflex is completely occluded.  He had normal left ventricular systolic function at that time.  3.  Hyperlipidemia:  Last lipid profile was in 2020 Will redraw today  He is intolerant to statins He may need nexlatol or PCSK9 inhibitors  Needs dietary consult    For questions or updates, please contact [place] Please consult [email or web address] for contact info under        Signed, Mertie Moores, MD  06/27/2020, 10:13 AM

## 2020-06-27 NOTE — Progress Notes (Signed)
  Echocardiogram 2D Echocardiogram has been performed.  [name] 06/27/2020, 11:31 AM

## 2020-06-27 NOTE — Plan of Care (Signed)
  Problem: Education: Goal: Knowledge of General Education information will improve Description: Including pain rating scale, medication(s)/side effects and non-pharmacologic comfort measures Outcome: Progressing   Problem: Clinical Measurements: Goal: Ability to maintain clinical measurements within normal limits will improve Outcome: Progressing   Problem: Clinical Measurements: Goal: Diagnostic test results will improve Outcome: Progressing   Problem: Clinical Measurements: Goal: Respiratory complications will improve Outcome: Progressing   Problem: Elimination: Goal: Will not experience complications related to urinary retention Outcome: Progressing   Problem: Pain Managment: Goal: General experience of comfort will improve Outcome: Progressing

## 2020-06-27 NOTE — Progress Notes (Signed)
Brief Nutrition Note  RD consulted for diet education regarding CHF. RD working remotely.  Spoke with pt via phone call to room. Pt having difficulty hearing RD despite turning up volume on phone to max setting. Pt requested RD provide in-person diet education if possible. RD will postpone diet education until tomorrow when RD is on campus and able to visit with pt in-person. Pt is aware and ready for education tomorrow, 06/28/20.   Gustavus Bryant, MS, RD, LDN Inpatient Clinical Dietitian Please see AMiON for contact information.

## 2020-06-28 DIAGNOSIS — I5043 Acute on chronic combined systolic (congestive) and diastolic (congestive) heart failure: Secondary | ICD-10-CM

## 2020-06-28 LAB — BASIC METABOLIC PANEL
Anion gap: 8 (ref 5–15)
BUN: 17 mg/dL (ref 8–23)
CO2: 31 mmol/L (ref 22–32)
Calcium: 9.6 mg/dL (ref 8.9–10.3)
Chloride: 96 mmol/L — ABNORMAL LOW (ref 98–111)
Creatinine, Ser: 1.47 mg/dL — ABNORMAL HIGH (ref 0.61–1.24)
GFR, Estimated: 49 mL/min — ABNORMAL LOW (ref >60)
Glucose, Bld: 103 mg/dL — ABNORMAL HIGH (ref 70–99)
Potassium: 4.1 mmol/L (ref 3.5–5.1)
Sodium: 135 mmol/L (ref 135–145)

## 2020-06-28 LAB — MAGNESIUM: Magnesium: 1.9 mg/dL (ref 1.7–2.4)

## 2020-06-28 NOTE — Plan of Care (Signed)
Nutrition Education Note  RD consulted for nutrition education regarding CHF.  Spoke with pt at bedside. Pt reports good appetite but does not like the food here due to lack of flavor. Meal completions have been 100%.  Pt reports that he typically eats 3 meals daily. Pt does eat out frequently. He is the primary caregiver for his wife who has early dementia.  Breakfast: bacon, sausage, eggs Lunch: spaghetti OR [place] and gravy with vegetables Dinner: ham sandwich  Pt reports that he does have a scale at home but does not weigh himself daily. Pt has a family member who follows a low sodium diet and is going to reach out to him for resources and ideas.  RD provided "Low Sodium Nutrition Therapy" handout from the Academy of [employer]. Reviewed patient's dietary recall. Provided examples on ways to decrease sodium intake in diet. Discouraged intake of processed foods and use of salt shaker. Encouraged fresh fruits and vegetables as well as whole grain sources of carbohydrates to maximize fiber intake.   RD discussed why it is important for patient to adhere to diet recommendations, and emphasized the role of fluids, foods to avoid, and importance of weighing self daily. Teach back method used.  Expect fair to good compliance.  Current diet order is 2 gram sodium, patient is consuming approximately 100% of meals at this time. Labs and medications reviewed. No further nutrition interventions warranted at this time. RD contact information provided. If additional nutrition issues arise, please re-consult RD.    Gustavus Bryant, MS, RD, LDN Inpatient Clinical Dietitian Please see AMiON for contact information.

## 2020-06-28 NOTE — Evaluation (Signed)
Occupational Therapy Evaluation Patient Details Name: [name] MRN: [id number] DOB: [birth year] Today's Date: 06/28/2020    History of Present Illness 75 year old male with history of CAD s/p PCI LAD/RCA/LCx, HFpEF, 2nd degree heart block s/p AV-Micra ICP presenting with shortness of breath and chest pain especially with ambulation as well as peripheral edema. Admitted for acute on chronic diastolic heart failure, chest pain and hypertension.   Clinical Impression   Patient admitted for the above diagnosis.  Patient admits to not performing daily weights to monitor fluid retention.  He states his wife is currently admitted to [facility] as well.  Overall he is doing pretty well, he is usually at a Mod I level, but currently at a supervision level.  His biggest deficit is his O2 saturation.  The patient wanted to walk in the room without O2.  Patient walked from the recliner, around the foot of the bed to the door and back twice.  O2 saturation was 78% on RA.  O2 was reapplied and he rose to 91% on 2 L.  He is okay with home O2 if needed.  OT will follow in the acute level, but it is not anticipated he will need HH OT post acute.        Follow Up Recommendations  No OT follow up    Equipment Recommendations  None recommended by OT    Recommendations for Other Services       Precautions / Restrictions Precautions Precautions: Fall Restrictions Weight Bearing Restrictions: No Other Position/Activity Restrictions: O2 desaturation      Mobility Bed Mobility               General bed mobility comments: up in recliner    Transfers Overall transfer level: Needs assistance   Transfers: Sit to/from Stand Sit to Stand: Supervision              Balance Overall balance assessment: Needs assistance Sitting-balance support: Feet supported;No upper extremity supported Sitting balance-Leahy Scale: Good     Standing balance support: Single extremity supported Standing  balance-Leahy Scale: Fair Standing balance comment: able to walk in room without LOB noted and SPC                           ADL either performed or assessed with clinical judgement   ADL Overall ADL's : Needs assistance/impaired Eating/Feeding: Independent;Sitting   Grooming: Wash/dry hands;Wash/dry face;Supervision/safety;Standing           Upper Body Dressing : Set up;Sitting   Lower Body Dressing: Minimal assistance;Sit to/from stand   Toilet Transfer: Copy Details (indicate cue type and reason): SPC                 Vision Baseline Vision/History: Wears glasses Wears Glasses: Reading only Patient Visual Report: No change from baseline       Perception     Praxis      Pertinent Vitals/Pain Pain Assessment: No/denies pain     Hand Dominance Right   Extremity/Trunk Assessment Upper Extremity Assessment Upper Extremity Assessment: Overall WFL for tasks assessed   Lower Extremity Assessment Lower Extremity Assessment: Defer to PT evaluation       Communication Communication Communication: HOH   Cognition Arousal/Alertness: Awake/alert Behavior During Therapy: WFL for tasks assessed/performed Overall Cognitive Status: Within Functional Limits for tasks assessed  General Comments       Exercises     Shoulder Instructions      Home Living Family/patient expects to be discharged to:: Private residence Living Arrangements: Spouse/significant other Available Help at Discharge: Family;Available PRN/intermittently Type of Home: Mobile home Home Access: Ramped entrance     Home Layout: One level     Bathroom Shower/Tub: Occupational psychologist: Handicapped height     Home Equipment: Shower seat - built in;Cane - single point;Walker - 2 wheels          Prior Functioning/Environment Level of Independence: Independent with assistive  device(s)        Comments: walks with cane in community, works 6 hrs/day, 2wk/month at [employer].  Completes his own ADL with [employer] and sock aid.  Does all home mangement and meal prep at home.  spouse has dementia.  Cares for his own meds and continues to drive.        OT Problem List: Decreased activity tolerance;Impaired balance (sitting and/or standing)      OT Treatment/Interventions: Self-care/ADL training;Energy conservation;DME and/or AE instruction;Balance training;Therapeutic activities    OT Goals(Current goals can be found in the care plan section) Acute Rehab OT Goals Patient Stated Goal: Get back home, I'm supposed to work next week. OT Goal Formulation: With patient Time For Goal Achievement: 07/12/20 Potential to Achieve Goals: Good ADL Goals Pt Will Perform Lower Body Bathing: with modified independence;sit to/from stand Pt Will Perform Lower Body Dressing: with modified independence;sit to/from stand Pt Will Transfer to Toilet: with modified independence;ambulating;regular height toilet  OT Frequency: Min 2X/week   Barriers to D/C:    none       Co-evaluation              AM-PAC OT "6 Clicks" Daily Activity     Outcome Measure Help from another person eating meals?: None Help from another person taking care of personal grooming?: A Little Help from another person toileting, which includes using toliet, bedpan, or urinal?: A Little Help from another person bathing (including washing, rinsing, drying)?: A Little Help from another person to put on and taking off regular upper body clothing?: None Help from another person to put on and taking off regular lower body clothing?: A Little 6 Click Score: 20   End of Session Equipment Utilized During Treatment: Other (comment);Oxygen Nurse Communication: Mobility status  Activity Tolerance: Patient tolerated treatment well Patient left: in chair;with call bell/phone within reach                    Time: 1220-1241 OT Time Calculation (min): 21 min Charges:  OT General Charges $OT Visit: 1 Visit OT Evaluation $OT Eval Moderate Complexity: 1 Mod  06/28/2020  Rich, OTR/L  Acute Rehabilitation Services  Office:  [phone]   Metta Clines 06/28/2020, 12:49 PM

## 2020-06-28 NOTE — Progress Notes (Signed)
Progress Note  Patient Name: [name] Date of Encounter: 06/28/2020  [facility] Cardiologist: Pixie Casino, MD    Subjective   75 year old gentleman with a history of coronary artery disease, chronic diastolic congestive heart failure, second-degree heart block status post leadless pacemaker.  The patient only takes his furosemide 20 mg every other day or so.  His prescription was written for daily dosing.  He ran out of his furosemide about a week ago.  He has noticed increasing shortness of breath over this past week.  He is also noted some chest pain and pressure when he walks quickly.  I am concerned about this chest pain and pressure being angina.  He has diuresed 2 liters so far during this admission  Echo from yesterday showed LVEF of 45-50%.   Inpatient Medications    Scheduled Meds: . allopurinol  300 mg Oral Daily  . amLODipine  10 mg Oral Daily  . aspirin EC  81 mg Oral Daily  . [START ON 07/03/2020] atorvastatin  40 mg Oral Weekly  . enoxaparin (LOVENOX) injection  40 mg Subcutaneous Q24H  . furosemide  40 mg Intravenous BID  . losartan  100 mg Oral Daily  . pantoprazole  40 mg Oral Daily  . polyethylene glycol  17 g Oral Daily  . potassium chloride  20 mEq Oral BID  . sodium chloride flush  3 mL Intravenous Q12H  . spironolactone  25 mg Oral Daily   Continuous Infusions: . sodium chloride     PRN Meds: sodium chloride, acetaminophen, guaiFENesin-dextromethorphan, magnesium hydroxide, ondansetron (ZOFRAN) IV, sodium chloride flush   Vital Signs    Vitals:   06/27/20 2344 06/28/20 0300 06/28/20 0745 06/28/20 0823  BP: 97/62 121/70  (!) 120/51  Pulse: 62 72 66 61  Resp: 17 20 20  (!) 23  Temp: 97.9 F (36.6 C) 97.6 F (36.4 C)  98.1 F (36.7 C)  TempSrc: Oral Oral  Oral  SpO2: 98% 95% 91% 92%  Weight:  135.1 kg    Height:        Intake/Output Summary (Last 24 hours) at 06/28/2020 0938 Last data filed at 06/28/2020 0300 Gross per 24 hour   Intake 776 ml  Output 1250 ml  Net -474 ml   Last 3 Weights 06/28/2020 06/27/2020 02/06/2020  Weight (lbs) 297 lb 13.5 oz 307 lb 5.1 oz 300 lb  Weight (kg) 135.1 kg 139.4 kg 136.079 kg      Telemetry    NSR  - Personally Reviewed  ECG     NSR  - Personally Reviewed  Physical Exam   Physical Exam: Blood pressure (!) 120/51, pulse 61, temperature 98.1 F (36.7 C), temperature source Oral, resp. rate (!) 23, height 6\' 2"  (1.88 m), weight 135.1 kg, SpO2 92 %.  GEN: middle age, morbidly obese man.  NAD  HEENT: Normal NECK: No JVD; No carotid bruits LYMPHATICS: No lymphadenopathy CARDIAC: RR ,  Some chest wall tenderness this am  RESPIRATORY:  No rales,  Distant breath sounds  ABDOMEN: Soft, non-tender, non-distended MUSCULOSKELETAL:  1+ - 2+ pitting edema   SKIN: Warm and dry NEUROLOGIC:  Alert and oriented x 3   Labs    High Sensitivity Troponin:   Recent Labs  Lab 06/26/20 1511 06/26/20 1711 06/26/20 2238  TROPONINIHS 7 9 9       Chemistry Recent Labs  Lab 06/26/20 1511 06/27/20 0500 06/28/20 0007  NA 134* 138 135  K 4.0 3.7 4.1  CL 99  99 96*  CO2 25 30 31   GLUCOSE 127* 95 103*  BUN 16 13 17   CREATININE 1.20 1.21 1.47*  CALCIUM 9.8 9.9 9.6  GFRNONAA >60 >60 49*  ANIONGAP 10 9 8      Hematology Recent Labs  Lab 06/26/20 1511  WBC 8.1  RBC 5.13  HGB 15.4  HCT 48.1  MCV 93.8  MCH 30.0  MCHC 32.0  RDW 13.4  PLT 211    BNP Recent Labs  Lab 06/26/20 2343  BNP 264.0*     DDimer No results for input(s): DDIMER in the last 168 hours.   Radiology    DG Chest Portable 1 View  Result Date: 06/26/2020 CLINICAL DATA:  Chest pain. EXAM: PORTABLE CHEST 1 VIEW COMPARISON:  May 18, 2019. FINDINGS: Stable cardiomegaly. No pneumothorax or pleural effusion is noted. Right lung is clear. Minimal left basilar subsegmental atelectasis is noted. Bony thorax is unremarkable. IMPRESSION: Minimal left basilar subsegmental atelectasis. Electronically  Signed   By: Marijo Conception M.D.   On: 06/26/2020 15:45   ECHOCARDIOGRAM COMPLETE  Result Date: 06/27/2020    ECHOCARDIOGRAM REPORT   Patient Name:   [name] [facility] Date of Exam: 06/27/2020 Medical Rec #:  [id number]       Height:       74.0 in Accession #:    [id number]      Weight:       307.3 lb Date of Birth:  October 09, 1945       BSA:          2.609 m Patient Age:    60 years        BP:           168/87 mmHg Patient Gender: M               HR:           84 bpm. Exam Location:  Inpatient Procedure: 2D Echo, Color Doppler and Cardiac Doppler Indications:    Cardiomyopathy-Ischemic I25.5  History:        Patient has prior history of Echocardiogram examinations, most                 recent 04/15/2017. CAD and Previous Myocardial Infarction; Risk                 Factors:Former Smoker, Hypertension and Dyslipidemia.  Sonographer:    Bernadene Person RDCS Referring Phys: [id number] [place]  1. ABnormal septal motion distal septal and apical hypokinesis . Left ventricular ejection fraction, by estimation, is 45 to 50%. The left ventricle has mildly decreased function. The left ventricle has no regional wall motion abnormalities. There is mild left ventricular hypertrophy. Left ventricular diastolic parameters were normal.  2. Right ventricular systolic function is normal. The right ventricular size is normal. There is normal pulmonary artery systolic pressure.  3. Left atrial size was mildly dilated.  4. The mitral valve is normal in structure. Trivial mitral valve regurgitation. No evidence of mitral stenosis.  5. The aortic valve was not well visualized. Aortic valve regurgitation is not visualized. Mild aortic valve sclerosis is present, with no evidence of aortic valve stenosis.  6. The inferior vena cava is normal in size with greater than 50% respiratory variability, suggesting right atrial pressure of 3 mmHg. FINDINGS  Left Ventricle: ABnormal septal motion distal septal and apical hypokinesis.  Left ventricular ejection fraction, by estimation, is 45 to 50%. The left ventricle has mildly decreased function. The left  ventricle has no regional wall motion abnormalities.  The left ventricular internal cavity size was normal in size. There is mild left ventricular hypertrophy. Left ventricular diastolic parameters were normal. Right Ventricle: The right ventricular size is normal. No increase in right ventricular wall thickness. Right ventricular systolic function is normal. There is normal pulmonary artery systolic pressure. The tricuspid regurgitant velocity is 1.67 m/s, and  with an assumed right atrial pressure of 3 mmHg, the estimated right ventricular systolic pressure is 34.7 mmHg. Left Atrium: Left atrial size was mildly dilated. Right Atrium: Right atrial size was normal in size. Pericardium: There is no evidence of pericardial effusion. Mitral Valve: The mitral valve is normal in structure. Trivial mitral valve regurgitation. No evidence of mitral valve stenosis. Tricuspid Valve: The tricuspid valve is normal in structure. Tricuspid valve regurgitation is trivial. No evidence of tricuspid stenosis. Aortic Valve: The aortic valve was not well visualized. Aortic valve regurgitation is not visualized. Mild aortic valve sclerosis is present, with no evidence of aortic valve stenosis. Pulmonic Valve: The pulmonic valve was normal in structure. Pulmonic valve regurgitation is not visualized. No evidence of pulmonic stenosis. Aorta: The aortic root is normal in size and structure. Venous: The inferior vena cava is normal in size with greater than 50% respiratory variability, suggesting right atrial pressure of 3 mmHg. IAS/Shunts: No atrial level shunt detected by color flow Doppler.  LEFT VENTRICLE PLAX 2D LVIDd:         5.60 cm  Diastology LVIDs:         3.40 cm  LV e' medial:    4.78 cm/s LV PW:         1.20 cm  LV E/e' medial:  18.1 LV IVS:        1.30 cm  LV e' lateral:   5.92 cm/s LVOT diam:     2.00  cm  LV E/e' lateral: 14.6 LV SV:         79 LV SV Index:   30 LVOT Area:     3.14 cm  RIGHT VENTRICLE RV S prime:     11.80 cm/s TAPSE (M-mode): 1.7 cm LEFT ATRIUM              Index       RIGHT ATRIUM           Index LA diam:        4.10 cm  1.57 cm/m  RA Area:     17.70 cm LA Vol (A2C):   94.8 ml  36.34 ml/m RA Volume:   48.50 ml  18.59 ml/m LA Vol (A4C):   107.0 ml 41.01 ml/m LA Biplane Vol: 108.0 ml 41.40 ml/m  AORTIC VALVE LVOT Vmax:   119.00 cm/s LVOT Vmean:  85.000 cm/s LVOT VTI:    0.250 m  AORTA Ao Root diam: 3.50 cm Ao Asc diam:  3.20 cm MITRAL VALVE               TRICUSPID VALVE MV Area (PHT): 2.80 cm    TR Peak grad:   11.2 mmHg MV Decel Time: 271 msec    TR Vmax:        167.00 cm/s MV E velocity: 86.30 cm/s MV A velocity: 69.80 cm/s  SHUNTS MV E/A ratio:  1.24        Systemic VTI:  0.25 m  Systemic Diam: 2.00 cm Jenkins Rouge MD Electronically signed by Jenkins Rouge MD Signature Date/Time: 06/27/2020/12:15:20 PM    Final     Cardiac Studies      Patient Profile     75 y.o. male with hx of CAD presents with increasing dyspnea and chest pressure after running out of his lasix for the past week   Assessment & Plan    1. acute on chronic diastolic congestive heart failure:  He has diuresed 2 liters so far.  Cont IV lasix and spironolactone .  2.  Chest pressure:  No further cp that sounds like angina . He has some chest wall pain that is related to arm movement / chest movement   3.  Hyperlipidemia:   Chol = 154,  LDL is 83,  Trigs = 204 Cont diet     For questions or updates, please contact [place] Please consult [email or web address] for contact info under        Signed, Mertie Moores, MD  06/28/2020, 9:38 AM

## 2020-06-28 NOTE — Plan of Care (Signed)
  Problem: Education: Goal: Knowledge of General Education information will improve Description: Including pain rating scale, medication(s)/side effects and non-pharmacologic comfort measures Outcome: Progressing   Problem: Health Behavior/Discharge Planning: Goal: Ability to manage health-related needs will improve Outcome: Progressing   Problem: Clinical Measurements: Goal: Cardiovascular complication will be avoided Outcome: Progressing   Problem: Activity: Goal: Risk for activity intolerance will decrease Outcome: Progressing   Problem: Nutrition: Goal: Adequate nutrition will be maintained Outcome: Progressing   Problem: Elimination: Goal: Will not experience complications related to urinary retention Outcome: Progressing   Problem: Pain Managment: Goal: General experience of comfort will improve Outcome: Progressing

## 2020-06-28 NOTE — Plan of Care (Signed)

## 2020-06-29 DIAGNOSIS — I5031 Acute diastolic (congestive) heart failure: Secondary | ICD-10-CM

## 2020-06-29 LAB — MAGNESIUM: Magnesium: 2 mg/dL (ref 1.7–2.4)

## 2020-06-29 LAB — BASIC METABOLIC PANEL
Anion gap: 9 (ref 5–15)
BUN: 25 mg/dL — ABNORMAL HIGH (ref 8–23)
CO2: 33 mmol/L — ABNORMAL HIGH (ref 22–32)
Calcium: 10 mg/dL (ref 8.9–10.3)
Chloride: 94 mmol/L — ABNORMAL LOW (ref 98–111)
Creatinine, Ser: 1.87 mg/dL — ABNORMAL HIGH (ref 0.61–1.24)
GFR, Estimated: 37 mL/min — ABNORMAL LOW (ref >60)
Glucose, Bld: 87 mg/dL (ref 70–99)
Potassium: 4.7 mmol/L (ref 3.5–5.1)
Sodium: 136 mmol/L (ref 135–145)

## 2020-06-29 NOTE — Plan of Care (Signed)

## 2020-06-29 NOTE — Progress Notes (Signed)
Physical Therapy Treatment Patient Details Name: [name] MRN: [id number] DOB: 06-30-1945 Today's Date: 06/29/2020    History of Present Illness 75 year old male with history of CAD s/p PCI LAD/RCA/LCx, HFpEF, 2nd degree heart block s/p AV-Micra ICP presenting with shortness of breath and chest pain especially with ambulation as well as peripheral edema. Admitted for acute on chronic diastolic heart failure, chest pain and hypertension.    PT Comments    Pt is making progress towards his goals however continues to be limited in mobility by decreased safety awareness, and oxygen desaturation in presence of decreased strength, balance and endurance. Pt is currently supervision for transfers and minA for ambulation of 300 feet with RW. Pt initially attempted to ambulate with his [employer] but also heavily utilized furniture in the room for support. When pt reached out for a trash bin on wheels and needed additional assist to steady pt agreeable to use RW. D/c plans remain appropriate at this time. PT will continue to follow acutely.    Follow Up Recommendations  Home health PT;Supervision for mobility/OOB     Equipment Recommendations  None recommended by PT (has necessary equipment)    Recommendations for Other Services OT consult     Precautions / Restrictions Precautions Precautions: Fall Precaution Comments: 1x in last 6 months had fall with broken LE Restrictions Weight Bearing Restrictions: No Other Position/Activity Restrictions: O2 desaturation    Mobility  Bed Mobility               General bed mobility comments: OOB in recliner  Transfers Overall transfer level: Needs assistance Equipment used: None Transfers: Sit to/from Stand Sit to Stand: Supervision         General transfer comment: supervision for power up and steadying with cane to use urinal and again before walking, slightly unsteady but able to get his feet under himself and  steady  Ambulation/Gait Ambulation/Gait assistance: Min assist Gait Distance (Feet): 300 Feet Assistive device: Straight cane;Rolling walker (2 wheeled) Gait Pattern/deviations: Step-through pattern;Decreased step length - right;Decreased step length - left;Trunk flexed Gait velocity: slowed Gait velocity interpretation: <1.8 ft/sec, indicate of risk for recurrent falls General Gait Details: pt initially refused RW and ambulated to door of room with cane and heavy use of when he reached the door he instictively reached out for the next piece of furniture which happened to be a trash cart on wheels, requires min A for steadying as the cart rolled and then pt agreeable to using RW, pt with slow, steady gait using RW. encouraged use until he is steadier, pt voices understanding         Balance Overall balance assessment: Needs assistance Sitting-balance support: Feet supported;No upper extremity supported Sitting balance-Leahy Scale: Fair     Standing balance support: No upper extremity supported;During functional activity Standing balance-Leahy Scale: Poor Standing balance comment: requires single UE support on SPC for static balance                            Cognition Arousal/Alertness: Awake/alert Behavior During Therapy: WFL for tasks assessed/performed Overall Cognitive Status: Within Functional Limits for tasks assessed                                           General Comments General comments (skin integrity, edema, etc.): Pt on 3 L  O2 via [place] on entry with SaO2 96%O2 poor pleth waveform with ambulation however when reestablished SaO2 92%O2      Pertinent Vitals/Pain Pain Assessment: Faces Faces Pain Scale: Hurts little more Pain Location: R shoulder OA Pain Descriptors / Indicators: Grimacing;Guarding Pain Intervention(s): Limited activity within patient's tolerance;Monitored during session;Repositioned    Home Living Family/patient  expects to be discharged to:: Private residence Living Arrangements: Spouse/significant other (pt is caregiver for wife with dementia) Available Help at Discharge: Family;Available PRN/intermittently Type of Home: Mobile home Home Access: Ramped entrance   Home Layout: One level Home Equipment: Shower seat - built in;Cane - single point;Walker - 2 wheels      Prior Function Level of Independence: Independent with assistive device(s)      Comments: walks with cane in community, works 6 hrs/day, 2wk/month   PT Goals (current goals can now be found in the care plan section) Acute Rehab PT Goals Patient Stated Goal: go home and take care of wife PT Goal Formulation: With patient Time For Goal Achievement: 07/11/20 Potential to Achieve Goals: Good Progress towards PT goals: Progressing toward goals    Frequency    Min 3X/week      PT Plan Current plan remains appropriate       AM-PAC PT "6 Clicks" Mobility   Outcome Measure  Help needed turning from your back to your side while in a flat bed without using bedrails?: None Help needed moving from lying on your back to sitting on the side of a flat bed without using bedrails?: None Help needed moving to and from a bed to a chair (including a wheelchair)?: A Little Help needed standing up from a chair using your arms (e.g., wheelchair or bedside chair)?: A Little Help needed to walk in hospital room?: A Little Help needed climbing 3-5 steps with a railing? : A Lot 6 Click Score: 19    End of Session Equipment Utilized During Treatment: Gait belt;Oxygen Activity Tolerance: Patient tolerated treatment well Patient left: in chair;with call bell/phone within reach Nurse Communication: Mobility status PT Visit Diagnosis: Unsteadiness on feet (R26.81);Other abnormalities of gait and mobility (R26.89);Muscle weakness (generalized) (M62.81);History of falling (Z91.81);Repeated falls (R29.6);Difficulty in walking, not elsewhere  classified (R26.2);Dizziness and giddiness (R42)     Time: 0165-5374 PT Time Calculation (min) (ACUTE ONLY): 15 min  Charges:  $Gait Training: 8-22 mins                     [name] B. Migdalia Dk PT, DPT Acute Rehabilitation Services Pager [phone] Office [phone]    [place] 06/29/2020, 12:28 PM

## 2020-06-29 NOTE — Progress Notes (Signed)
Patient refused the use of CPAP for the evening.  

## 2020-06-29 NOTE — Progress Notes (Signed)
Patient refused use of CPAP for the evening 

## 2020-06-29 NOTE — Progress Notes (Signed)
Progress Note  Patient Name: [name] Date of Encounter: 06/29/2020  [facility] Cardiologist: Pixie Casino, MD    Subjective   75 year old gentleman with a history of coronary artery disease, chronic diastolic congestive heart failure, second-degree heart block status post leadless pacemaker.  The patient only takes his furosemide 20 mg every other day or so.  His prescription was written for daily dosing.  He ran out of his furosemide about a week ago.  He has noticed increasing shortness of breath over this past week.  He is also noted some chest pain and pressure when he walks quickly.  I am concerned about this chest pain and pressure being angina.  He has diuresed 2.9 liters  so far during this admission  Echo from yesterday showed LVEF of 45-50%.   Inpatient Medications    Scheduled Meds: . allopurinol  300 mg Oral Daily  . amLODipine  10 mg Oral Daily  . aspirin EC  81 mg Oral Daily  . [START ON 07/03/2020] atorvastatin  40 mg Oral Weekly  . enoxaparin (LOVENOX) injection  40 mg Subcutaneous Q24H  . furosemide  40 mg Intravenous BID  . losartan  100 mg Oral Daily  . pantoprazole  40 mg Oral Daily  . polyethylene glycol  17 g Oral Daily  . potassium chloride  20 mEq Oral BID  . sodium chloride flush  3 mL Intravenous Q12H  . spironolactone  25 mg Oral Daily   Continuous Infusions: . sodium chloride     PRN Meds: sodium chloride, acetaminophen, guaiFENesin-dextromethorphan, magnesium hydroxide, ondansetron (ZOFRAN) IV, sodium chloride flush   Vital Signs    Vitals:   06/28/20 2247 06/29/20 0251 06/29/20 0506 06/29/20 0754  BP: 112/67 127/62  117/68  Pulse: 61 66  76  Resp: 20 18  (!) 21  Temp: 98.1 F (36.7 C) 98 F (36.7 C)  98.2 F (36.8 C)  TempSrc: Oral Oral  Oral  SpO2: 91% 95%  90%  Weight:   (!) 136.3 kg   Height:        Intake/Output Summary (Last 24 hours) at 06/29/2020 0854 Last data filed at 06/29/2020 0754 Gross per 24 hour   Intake 606 ml  Output 1575 ml  Net -969 ml   Last 3 Weights 06/29/2020 06/28/2020 06/27/2020  Weight (lbs) 300 lb 7.8 oz 297 lb 13.5 oz 307 lb 5.1 oz  Weight (kg) 136.3 kg 135.1 kg 139.4 kg      Telemetry    NSR  - Personally Reviewed  ECG     NSR  - Personally Reviewed  Physical Exam   Physical Exam: Blood pressure 117/68, pulse 76, temperature 98.2 F (36.8 C), temperature source Oral, resp. rate (!) 21, height 6\' 2"  (1.88 m), weight (!) 136.3 kg, SpO2 90 %.  GEN:  Elderly male,   Morbidly obese.  HEENT: Normal NECK: No JVD; No carotid bruits LYMPHATICS: No lymphadenopathy CARDIAC: RRR , soft systolic murmur   RESPIRATORY:  bilat rhonchi  ABDOMEN: Soft, non-tender, non-distended MUSCULOSKELETAL:  1+ edema  SKIN: Warm and dry NEUROLOGIC:  Alert and oriented x 3   Labs    High Sensitivity Troponin:   Recent Labs  Lab 06/26/20 1511 06/26/20 1711 06/26/20 2238  TROPONINIHS 7 9 9       Chemistry Recent Labs  Lab 06/27/20 0500 06/28/20 0007 06/29/20 0040  NA 138 135 136  K 3.7 4.1 4.7  CL 99 96* 94*  CO2 30 31 33*  GLUCOSE 95 103* 87  BUN 13 17 25*  CREATININE 1.21 1.47* 1.87*  CALCIUM 9.9 9.6 10.0  GFRNONAA >60 49* 37*  ANIONGAP 9 8 9      Hematology Recent Labs  Lab 06/26/20 1511  WBC 8.1  RBC 5.13  HGB 15.4  HCT 48.1  MCV 93.8  MCH 30.0  MCHC 32.0  RDW 13.4  PLT 211    BNP Recent Labs  Lab 06/26/20 2343  BNP 264.0*     DDimer No results for input(s): DDIMER in the last 168 hours.   Radiology    ECHOCARDIOGRAM COMPLETE  Result Date: 06/27/2020    ECHOCARDIOGRAM REPORT   Patient Name:   [name] [facility] Date of Exam: 06/27/2020 Medical Rec #:  [id number]       Height:       74.0 in Accession #:    [id number]      Weight:       307.3 lb Date of Birth:  07-Jul-1945       BSA:          2.609 m Patient Age:    53 years        BP:           168/87 mmHg Patient Gender: M               HR:           84 bpm. Exam Location:  Inpatient  Procedure: 2D Echo, Color Doppler and Cardiac Doppler Indications:    Cardiomyopathy-Ischemic I25.5  History:        Patient has prior history of Echocardiogram examinations, most                 recent 04/15/2017. CAD and Previous Myocardial Infarction; Risk                 Factors:Former Smoker, Hypertension and Dyslipidemia.  Sonographer:    Bernadene Person RDCS Referring Phys: [id number] [place]  1. ABnormal septal motion distal septal and apical hypokinesis . Left ventricular ejection fraction, by estimation, is 45 to 50%. The left ventricle has mildly decreased function. The left ventricle has no regional wall motion abnormalities. There is mild left ventricular hypertrophy. Left ventricular diastolic parameters were normal.  2. Right ventricular systolic function is normal. The right ventricular size is normal. There is normal pulmonary artery systolic pressure.  3. Left atrial size was mildly dilated.  4. The mitral valve is normal in structure. Trivial mitral valve regurgitation. No evidence of mitral stenosis.  5. The aortic valve was not well visualized. Aortic valve regurgitation is not visualized. Mild aortic valve sclerosis is present, with no evidence of aortic valve stenosis.  6. The inferior vena cava is normal in size with greater than 50% respiratory variability, suggesting right atrial pressure of 3 mmHg. FINDINGS  Left Ventricle: ABnormal septal motion distal septal and apical hypokinesis. Left ventricular ejection fraction, by estimation, is 45 to 50%. The left ventricle has mildly decreased function. The left ventricle has no regional wall motion abnormalities.  The left ventricular internal cavity size was normal in size. There is mild left ventricular hypertrophy. Left ventricular diastolic parameters were normal. Right Ventricle: The right ventricular size is normal. No increase in right ventricular wall thickness. Right ventricular systolic function is normal. There is  normal pulmonary artery systolic pressure. The tricuspid regurgitant velocity is 1.67 m/s, and  with an assumed right atrial pressure of 3 mmHg, the estimated right ventricular  systolic pressure is 05.3 mmHg. Left Atrium: Left atrial size was mildly dilated. Right Atrium: Right atrial size was normal in size. Pericardium: There is no evidence of pericardial effusion. Mitral Valve: The mitral valve is normal in structure. Trivial mitral valve regurgitation. No evidence of mitral valve stenosis. Tricuspid Valve: The tricuspid valve is normal in structure. Tricuspid valve regurgitation is trivial. No evidence of tricuspid stenosis. Aortic Valve: The aortic valve was not well visualized. Aortic valve regurgitation is not visualized. Mild aortic valve sclerosis is present, with no evidence of aortic valve stenosis. Pulmonic Valve: The pulmonic valve was normal in structure. Pulmonic valve regurgitation is not visualized. No evidence of pulmonic stenosis. Aorta: The aortic root is normal in size and structure. Venous: The inferior vena cava is normal in size with greater than 50% respiratory variability, suggesting right atrial pressure of 3 mmHg. IAS/Shunts: No atrial level shunt detected by color flow Doppler.  LEFT VENTRICLE PLAX 2D LVIDd:         5.60 cm  Diastology LVIDs:         3.40 cm  LV e' medial:    4.78 cm/s LV PW:         1.20 cm  LV E/e' medial:  18.1 LV IVS:        1.30 cm  LV e' lateral:   5.92 cm/s LVOT diam:     2.00 cm  LV E/e' lateral: 14.6 LV SV:         79 LV SV Index:   30 LVOT Area:     3.14 cm  RIGHT VENTRICLE RV S prime:     11.80 cm/s TAPSE (M-mode): 1.7 cm LEFT ATRIUM              Index       RIGHT ATRIUM           Index LA diam:        4.10 cm  1.57 cm/m  RA Area:     17.70 cm LA Vol (A2C):   94.8 ml  36.34 ml/m RA Volume:   48.50 ml  18.59 ml/m LA Vol (A4C):   107.0 ml 41.01 ml/m LA Biplane Vol: 108.0 ml 41.40 ml/m  AORTIC VALVE LVOT Vmax:   119.00 cm/s LVOT Vmean:  85.000 cm/s LVOT  VTI:    0.250 m  AORTA Ao Root diam: 3.50 cm Ao Asc diam:  3.20 cm MITRAL VALVE               TRICUSPID VALVE MV Area (PHT): 2.80 cm    TR Peak grad:   11.2 mmHg MV Decel Time: 271 msec    TR Vmax:        167.00 cm/s MV E velocity: 86.30 cm/s MV A velocity: 69.80 cm/s  SHUNTS MV E/A ratio:  1.24        Systemic VTI:  0.25 m                            Systemic Diam: 2.00 cm Jenkins Rouge MD Electronically signed by Jenkins Rouge MD Signature Date/Time: 06/27/2020/12:15:20 PM    Final     Cardiac Studies      Patient Profile     75 y.o. male with hx of CAD presents with increasing dyspnea and chest pressure after running out of his lasix for the past week   Assessment & Plan    1. acute on chronic diastolic congestive heart  failure:  Has diuresed 2.9 liters.   Cont IV lasix and spiro  Still has leg edema .  Cont to diurese   2.  Chest pressure:  No further cp that sounds like angina . He has some chest wall pain that is related to arm movement / chest movement   3.  Hyperlipidemia:   Chol = 154,  LDL is 83,  Trigs = 204 Cont diet     For questions or updates, please contact [place] Please consult [email or web address] for contact info under        Signed, Mertie Moores, MD  06/29/2020, 8:54 AM

## 2020-06-30 DIAGNOSIS — I5031 Acute diastolic (congestive) heart failure: Secondary | ICD-10-CM | POA: Diagnosis not present

## 2020-06-30 LAB — BASIC METABOLIC PANEL
Anion gap: 9 (ref 5–15)
BUN: 31 mg/dL — ABNORMAL HIGH (ref 8–23)
CO2: 31 mmol/L (ref 22–32)
Calcium: 9.9 mg/dL (ref 8.9–10.3)
Chloride: 96 mmol/L — ABNORMAL LOW (ref 98–111)
Creatinine, Ser: 1.94 mg/dL — ABNORMAL HIGH (ref 0.61–1.24)
GFR, Estimated: 35 mL/min — ABNORMAL LOW (ref >60)
Glucose, Bld: 103 mg/dL — ABNORMAL HIGH (ref 70–99)
Potassium: 4.7 mmol/L (ref 3.5–5.1)
Sodium: 136 mmol/L (ref 135–145)

## 2020-06-30 LAB — MAGNESIUM: Magnesium: 2 mg/dL (ref 1.7–2.4)

## 2020-06-30 NOTE — Plan of Care (Signed)
  Problem: Activity: Goal: Risk for activity intolerance will decrease Outcome: Progressing   Problem: Coping: Goal: Level of anxiety will decrease Outcome: Progressing   

## 2020-06-30 NOTE — Progress Notes (Signed)
Occupational Therapy Treatment Patient Details Name: [name] MRN: [id number] DOB: 04-22-1946 Today's Date: 06/30/2020    History of present illness 75 year old male with history of CAD s/p PCI LAD/RCA/LCx, HFpEF, 2nd degree heart block s/p AV-Micra ICP presenting with shortness of breath and chest pain especially with ambulation as well as peripheral edema. Admitted for acute on chronic diastolic heart failure, chest pain and hypertension.   OT comments  Patient seen today for full ADL in sit/stand level.  Patient is functioning close to his baseline, needing Min A for lower body ADL and setup for upper body ADL.  OT dropped his frequency to 1x/wk.  He was encouraged to walk the halls with staff, he's hoping to discharge home early next week.  Patient has a hip kit at home and understands its usage.        Follow Up Recommendations  No OT follow up    Equipment Recommendations  None recommended by OT    Recommendations for Other Services      Precautions / Restrictions Precautions Precautions: Fall Precaution Comments: O2 sats Restrictions Weight Bearing Restrictions: No       Mobility Bed Mobility Overal bed mobility: Modified Independent Bed Mobility: Supine to Sit     Supine to sit: Modified independent (Device/Increase time);HOB elevated        Transfers Overall transfer level: Modified independent   Transfers: Sit to/from [facility] Sit to Stand: Modified independent (Device/Increase time) Stand pivot transfers: Supervision       General transfer comment: continues to be slightly unsteady at first.    Balance Overall balance assessment: Mild deficits observed, not formally tested                                         ADL either performed or assessed with clinical judgement   ADL   Eating/Feeding: Independent;Sitting   Grooming: Wash/dry hands;Wash/dry face;Standing;Set up;Sitting   Upper Body Bathing: Set  up;Sitting   Lower Body Bathing: Set up;Sit to/from stand   Upper Body Dressing : Set up;Sitting   Lower Body Dressing: Minimal assistance;Sit to/from stand Lower Body Dressing Details (indicate cue type and reason): unable to reach B feet - assist to start mesh underware             Functional mobility during ADLs: Modified independent;Cane                         Cognition Arousal/Alertness: Awake/alert Behavior During Therapy: WFL for tasks assessed/performed Overall Cognitive Status: Within Functional Limits for tasks assessed                                                      General Comments Continues on 3L - O2 removed for ADL - desat'd to 83% on RA.  O2 placed back on.    Pertinent Vitals/ Pain       Pain Assessment: No/denies pain  Frequency  Min 1X/week        Progress Toward Goals  OT Goals(current goals can now be found in the care plan section)  Progress towards OT goals: Progressing toward goals  Acute Rehab OT Goals Patient Stated Goal: go home and take care of wife OT Goal Formulation: With patient Time For Goal Achievement: 07/12/20 Potential to Achieve Goals: Good  Plan Discharge plan remains appropriate    Co-evaluation                 AM-PAC OT "6 Clicks" Daily Activity     Outcome Measure   Help from another person eating meals?: None Help from another person taking care of personal grooming?: None Help from another person toileting, which includes using toliet, bedpan, or urinal?: A Little Help from another person bathing (including washing, rinsing, drying)?: A Little Help from another person to put on and taking off regular upper body clothing?: None Help from another person to put on and taking off regular lower body clothing?: A Little 6 Click Score: 21    End of Session Equipment Utilized During Treatment:  Oxygen  OT Visit Diagnosis: Unsteadiness on feet (R26.81)   Activity Tolerance Patient tolerated treatment well   Patient Left in chair;with call bell/phone within reach   Nurse Communication          Time: 1244-1310 OT Time Calculation (min): 26 min  Charges: OT General Charges $OT Visit: 1 Visit OT Treatments $Self Care/Home Management : 23-37 mins  06/30/2020  Rich, OTR/L  Acute Rehabilitation Services  Office:  [phone]    Metta Clines 06/30/2020, 3:07 PM

## 2020-06-30 NOTE — Progress Notes (Signed)
Progress Note  Patient Name: [name] Date of Encounter: 06/30/2020  [facility] Cardiologist: Pixie Casino, MD    Subjective   75 year old gentleman with a history of coronary artery disease, chronic diastolic congestive heart failure, second-degree heart block status post leadless pacemaker.  The patient only takes his furosemide 20 mg every other day or so.  His prescription was written for daily dosing.  He ran out of his furosemide about a week ago.  He has noticed increasing shortness of breath over this past week.  He is also noted some chest pain and pressure when he walks quickly.  I am concerned about this chest pain and pressure being angina  Worried about wife who is on 5 th floor She has dementia   Inpatient Medications    Scheduled Meds: . allopurinol  300 mg Oral Daily  . amLODipine  10 mg Oral Daily  . aspirin EC  81 mg Oral Daily  . [START ON 07/03/2020] atorvastatin  40 mg Oral Weekly  . enoxaparin (LOVENOX) injection  40 mg Subcutaneous Q24H  . furosemide  40 mg Intravenous BID  . losartan  100 mg Oral Daily  . pantoprazole  40 mg Oral Daily  . polyethylene glycol  17 g Oral Daily  . potassium chloride  20 mEq Oral BID  . sodium chloride flush  3 mL Intravenous Q12H  . spironolactone  25 mg Oral Daily   Continuous Infusions: . sodium chloride     PRN Meds: sodium chloride, acetaminophen, guaiFENesin-dextromethorphan, magnesium hydroxide, ondansetron (ZOFRAN) IV, sodium chloride flush   Vital Signs    Vitals:   06/30/20 0334 06/30/20 0400 06/30/20 0504 06/30/20 0729  BP: 107/63   102/74  Pulse: 60 72  71  Resp: 15 (!) 25  17  Temp: 97.7 F (36.5 C)   97.7 F (36.5 C)  TempSrc: Oral   Oral  SpO2: 93% 91%  92%  Weight:   (!) 136.2 kg   Height:        Intake/Output Summary (Last 24 hours) at 06/30/2020 0903 Last data filed at 06/30/2020 0729 Gross per 24 hour  Intake 600 ml  Output 1900 ml  Net -1300 ml   Last 3 Weights 06/30/2020  06/29/2020 06/28/2020  Weight (lbs) 300 lb 4.3 oz 300 lb 7.8 oz 297 lb 13.5 oz  Weight (kg) 136.2 kg 136.3 kg 135.1 kg      Telemetry    NSR  - Personally Reviewed  ECG     NSR  - Personally Reviewed  Physical Exam   Physical Exam: Blood pressure 102/74, pulse 71, temperature 97.7 F (36.5 C), temperature source Oral, resp. rate 17, height 6\' 2"  (1.88 m), weight (!) 136.2 kg, SpO2 92 %.  Obese male No distress Lungs clear No murmur Plus 2 bilateral edema with chronic stasis changes    Labs    High Sensitivity Troponin:   Recent Labs  Lab 06/26/20 1511 06/26/20 1711 06/26/20 2238  TROPONINIHS 7 9 9       Chemistry Recent Labs  Lab 06/28/20 0007 06/29/20 0040 06/30/20 0021  NA 135 136 136  K 4.1 4.7 4.7  CL 96* 94* 96*  CO2 31 33* 31  GLUCOSE 103* 87 103*  BUN 17 25* 31*  CREATININE 1.47* 1.87* 1.94*  CALCIUM 9.6 10.0 9.9  GFRNONAA 49* 37* 35*  ANIONGAP 8 9 9      Hematology Recent Labs  Lab 06/26/20 1511  WBC 8.1  RBC 5.13  HGB 15.4  HCT 48.1  MCV 93.8  MCH 30.0  MCHC 32.0  RDW 13.4  PLT 211    BNP Recent Labs  Lab 06/26/20 2343  BNP 264.0*     DDimer No results for input(s): DDIMER in the last 168 hours.   Radiology    No results found.  Cardiac Studies      Patient Profile     75 y.o. male with hx of CAD presents with increasing dyspnea and chest pressure after running out of his lasix for the past week   Assessment & Plan    1. acute on chronic diastolic congestive heart failure: EF 40-45% by TTE this admission  1.3 L out yesterday continue current diuresis    2.  Chest pressure:  No further cp that sounds like angina . He has some chest wall pain that is related to arm movement / chest movement Can consider outpatient stress testing   3.  Hyperlipidemia:   Chol = 154,  LDL is 83,  Trigs = 204 Cont diet   4. PPM:  Leadless apical system seems in stable position LV apex on CXR Telemetry with normal function      For questions or updates, please contact [facility] Please consult [email or web address] for contact info under        Signed, Jenkins Rouge, MD  06/30/2020, 9:03 AM

## 2020-06-30 NOTE — Progress Notes (Signed)
Pt has refused cpap at this time.  RT will continue to monitor. 

## 2020-07-01 DIAGNOSIS — I5031 Acute diastolic (congestive) heart failure: Secondary | ICD-10-CM | POA: Diagnosis not present

## 2020-07-01 LAB — MAGNESIUM: Magnesium: 2.1 mg/dL (ref 1.7–2.4)

## 2020-07-01 LAB — BASIC METABOLIC PANEL
Anion gap: 9 (ref 5–15)
BUN: 36 mg/dL — ABNORMAL HIGH (ref 8–23)
CO2: 32 mmol/L (ref 22–32)
Calcium: 10 mg/dL (ref 8.9–10.3)
Chloride: 95 mmol/L — ABNORMAL LOW (ref 98–111)
Creatinine, Ser: 1.68 mg/dL — ABNORMAL HIGH (ref 0.61–1.24)
GFR, Estimated: 42 mL/min — ABNORMAL LOW (ref >60)
Glucose, Bld: 106 mg/dL — ABNORMAL HIGH (ref 70–99)
Potassium: 4.6 mmol/L (ref 3.5–5.1)
Sodium: 136 mmol/L (ref 135–145)

## 2020-07-01 NOTE — Progress Notes (Signed)
Progress Note  Patient Name: [name] Date of Encounter: 07/01/2020  [facility] Cardiologist: Pixie Casino, MD    Subjective   75 year old gentleman with a history of coronary artery disease, chronic diastolic congestive heart failure, second-degree heart block status post leadless pacemaker.  The patient only takes his furosemide 20 mg every other day or so.  His prescription was written for daily dosing.  He ran out of his furosemide about a week ago.  He has noticed increasing shortness of breath over this past week.  He is also noted some chest pain and pressure when he walks quickly.  I am concerned about this chest pain and pressure being angina  Worried about wife who is on 5 th floor She has dementia  Ate breakfast and ambulated Less dyspnea   Inpatient Medications    Scheduled Meds: . allopurinol  300 mg Oral Daily  . amLODipine  10 mg Oral Daily  . aspirin EC  81 mg Oral Daily  . [START ON 07/03/2020] atorvastatin  40 mg Oral Weekly  . enoxaparin (LOVENOX) injection  40 mg Subcutaneous Q24H  . furosemide  40 mg Intravenous BID  . losartan  100 mg Oral Daily  . pantoprazole  40 mg Oral Daily  . polyethylene glycol  17 g Oral Daily  . potassium chloride  20 mEq Oral BID  . sodium chloride flush  3 mL Intravenous Q12H  . spironolactone  25 mg Oral Daily   Continuous Infusions: . sodium chloride     PRN Meds: sodium chloride, acetaminophen, guaiFENesin-dextromethorphan, magnesium hydroxide, ondansetron (ZOFRAN) IV, sodium chloride flush   Vital Signs    Vitals:   07/01/20 0200 07/01/20 0300 07/01/20 0415 07/01/20 0742  BP:   125/63 110/69  Pulse: 60 (!) 56 61 64  Resp: (!) 23 17 19 19   Temp:   (!) 97.5 F (36.4 C) 97.9 F (36.6 C)  TempSrc:   Oral Oral  SpO2: 90% 94% 93% 90%  Weight:   135.2 kg   Height:        Intake/Output Summary (Last 24 hours) at 07/01/2020 0846 Last data filed at 07/01/2020 0748 Gross per 24 hour  Intake 1350 ml   Output 2350 ml  Net -1000 ml   Last 3 Weights 07/01/2020 06/30/2020 06/29/2020  Weight (lbs) 298 lb 300 lb 4.3 oz 300 lb 7.8 oz  Weight (kg) 135.172 kg 136.2 kg 136.3 kg      Telemetry    NSR  - Personally Reviewed  ECG     NSR  - Personally Reviewed  Physical Exam   Physical Exam: Blood pressure 110/69, pulse 64, temperature 97.9 F (36.6 C), temperature source Oral, resp. rate 19, height 6\' 2"  (1.88 m), weight 135.2 kg, SpO2 90 %.  Obese male No distress Lungs clear No murmur Plus 2 bilateral edema with chronic stasis changes    Labs    High Sensitivity Troponin:   Recent Labs  Lab 06/26/20 1511 06/26/20 1711 06/26/20 2238  TROPONINIHS 7 9 9       Chemistry Recent Labs  Lab 06/29/20 0040 06/30/20 0021 07/01/20 0100  NA 136 136 136  K 4.7 4.7 4.6  CL 94* 96* 95*  CO2 33* 31 32  GLUCOSE 87 103* 106*  BUN 25* 31* 36*  CREATININE 1.87* 1.94* 1.68*  CALCIUM 10.0 9.9 10.0  GFRNONAA 37* 35* 42*  ANIONGAP 9 9 9      Hematology Recent Labs  Lab 06/26/20 1511  WBC  8.1  RBC 5.13  HGB 15.4  HCT 48.1  MCV 93.8  MCH 30.0  MCHC 32.0  RDW 13.4  PLT 211    BNP Recent Labs  Lab 06/26/20 2343  BNP 264.0*     DDimer No results for input(s): DDIMER in the last 168 hours.   Radiology    No results found.  Cardiac Studies      Patient Profile     75 y.o. male with hx of CAD presents with increasing dyspnea and chest pressure after running out of his lasix for the past week   Assessment & Plan    1. acute on chronic diastolic congestive heart failure: EF 40-45% by TTE this admission  1.0 L out yesterday continue current diuresis    2.  Chest pressure:  No further cp that sounds like angina . He has some chest wall pain that is related to arm movement / chest movement Can consider outpatient stress testing   3.  Hyperlipidemia:   Chol = 154,  LDL is 83,  Trigs = 204 Cont diet   4. PPM:  Leadless apical system seems in stable position  LV apex on CXR Telemetry with normal function     For questions or updates, please contact [facility] Please consult [email or web address] for contact info under        Signed, Jenkins Rouge, MD  07/01/2020, 8:46 AM

## 2020-07-02 DIAGNOSIS — C61 Malignant neoplasm of prostate: Secondary | ICD-10-CM | POA: Diagnosis not present

## 2020-07-02 DIAGNOSIS — E7800 Pure hypercholesterolemia, unspecified: Secondary | ICD-10-CM | POA: Diagnosis not present

## 2020-07-02 DIAGNOSIS — N1831 Chronic kidney disease, stage 3a: Secondary | ICD-10-CM | POA: Diagnosis not present

## 2020-07-02 DIAGNOSIS — I252 Old myocardial infarction: Secondary | ICD-10-CM | POA: Diagnosis not present

## 2020-07-02 DIAGNOSIS — K219 Gastro-esophageal reflux disease without esophagitis: Secondary | ICD-10-CM | POA: Diagnosis not present

## 2020-07-02 DIAGNOSIS — I2510 Atherosclerotic heart disease of native coronary artery without angina pectoris: Secondary | ICD-10-CM | POA: Diagnosis not present

## 2020-07-02 DIAGNOSIS — I5020 Unspecified systolic (congestive) heart failure: Secondary | ICD-10-CM

## 2020-07-02 DIAGNOSIS — J45909 Unspecified asthma, uncomplicated: Secondary | ICD-10-CM | POA: Diagnosis not present

## 2020-07-02 DIAGNOSIS — I129 Hypertensive chronic kidney disease with stage 1 through stage 4 chronic kidney disease, or unspecified chronic kidney disease: Secondary | ICD-10-CM | POA: Diagnosis not present

## 2020-07-02 DIAGNOSIS — I25119 Atherosclerotic heart disease of native coronary artery with unspecified angina pectoris: Secondary | ICD-10-CM | POA: Diagnosis not present

## 2020-07-02 DIAGNOSIS — I10 Essential (primary) hypertension: Secondary | ICD-10-CM | POA: Diagnosis not present

## 2020-07-02 MED ORDER — FUROSEMIDE 10 MG/ML IJ SOLN
80.0000 mg | Freq: Two times a day (BID) | INTRAMUSCULAR | Status: DC
  Administered 2020-07-02 – 2020-07-03 (×2): 80 mg via INTRAVENOUS
  Filled 2020-07-02 (×2): qty 8

## 2020-07-02 MED ORDER — SACUBITRIL-VALSARTAN 49-51 MG PO TABS
1.0000 | ORAL_TABLET | Freq: Two times a day (BID) | ORAL | Status: DC
  Administered 2020-07-02 (×2): 1 via ORAL
  Filled 2020-07-02 (×3): qty 1

## 2020-07-02 NOTE — Progress Notes (Signed)
Physical Therapy Treatment Patient Details Name: [name] MRN: [id number] DOB: 1946-01-01 Today's Date: 07/02/2020    History of Present Illness 75 year old male with history of CAD s/p PCI LAD/RCA/LCx, HFpEF, 2nd degree heart block s/p AV-Micra ICP presenting with shortness of breath and chest pain especially with ambulation as well as peripheral edema. Admitted for acute on chronic diastolic heart failure, chest pain and hypertension.    PT Comments    Pt supine in bed on arrival this session.  Pt eager to move and reports he feels better today.  SPO2 on RA did decrease to 86% but quickly recovers to 92% on RA.  Plan remains for HHPT.     Follow Up Recommendations  Home health PT;Supervision for mobility/OOB     Equipment Recommendations  None recommended by PT (has necessary DME)    Recommendations for Other Services       Precautions / Restrictions Precautions Precautions: Fall Precaution Comments: O2 sats Restrictions Weight Bearing Restrictions: No Other Position/Activity Restrictions: O2 desaturation    Mobility  Bed Mobility Overal bed mobility: Modified Independent Bed Mobility: Supine to Sit     Supine to sit: Modified independent (Device/Increase time)        Transfers Overall transfer level: Needs assistance Equipment used: Rolling walker (2 wheeled) Transfers: Sit to/from Stand Sit to Stand: Supervision         General transfer comment: Cues for hand placement to and from seated surface this session.  Pt with poor eccentric load back to seated surface.  Ambulation/Gait Ambulation/Gait assistance: Min guard Gait Distance (Feet): 150 Feet Assistive device: Rolling walker (2 wheeled) Gait Pattern/deviations: Step-through pattern;Decreased step length - right;Decreased step length - left;Trunk flexed Gait velocity: slowed   General Gait Details: Cues for upper trunk control and pacing.  Pt sats 86%-90% on RA, dropping at very end of session  to 80% with poor waveform.  Pt returned to 92% on RA post session.   Stairs             Wheelchair Mobility    Modified Rankin (Stroke Patients Only)       Balance Overall balance assessment: Mild deficits observed, not formally tested Sitting-balance support: Feet supported;No upper extremity supported Sitting balance-Leahy Scale: Fair       Standing balance-Leahy Scale: Poor                              Cognition Arousal/Alertness: Awake/alert Behavior During Therapy: WFL for tasks assessed/performed Overall Cognitive Status: Within Functional Limits for tasks assessed                                        Exercises      General Comments        Pertinent Vitals/Pain Pain Assessment: 0-10 Pain Score: 3  Pain Location: R knee Pain Descriptors / Indicators: Grimacing;Guarding Pain Intervention(s): Monitored during session;Repositioned    Home Living                      Prior Function            PT Goals (current goals can now be found in the care plan section) Acute Rehab PT Goals Patient Stated Goal: go home and take care of wife Potential to Achieve Goals: Good Progress towards PT goals: Progressing toward  goals    Frequency    Min 3X/week      PT Plan Current plan remains appropriate    Co-evaluation              AM-PAC PT "6 Clicks" Mobility   Outcome Measure  Help needed turning from your back to your side while in a flat bed without using bedrails?: None Help needed moving from lying on your back to sitting on the side of a flat bed without using bedrails?: None Help needed moving to and from a bed to a chair (including a wheelchair)?: A Little Help needed standing up from a chair using your arms (e.g., wheelchair or bedside chair)?: A Little Help needed to walk in hospital room?: A Little Help needed climbing 3-5 steps with a railing? : A Little 6 Click Score: 20    End of Session  Equipment Utilized During Treatment: Gait belt Activity Tolerance: Patient tolerated treatment well Patient left: in chair;with call bell/phone within reach;with chair alarm set Nurse Communication: Mobility status PT Visit Diagnosis: Unsteadiness on feet (R26.81);Other abnormalities of gait and mobility (R26.89);Muscle weakness (generalized) (M62.81);History of falling (Z91.81);Repeated falls (R29.6);Difficulty in walking, not elsewhere classified (R26.2);Dizziness and giddiness (R42)     Time: 1012-1029 PT Time Calculation (min) (ACUTE ONLY): 17 min  Charges:  $Gait Training: 8-22 mins                     Erasmo Leventhal , PTA Acute Rehabilitation Services Pager [phone] Office [phone]     [name] 07/02/2020, 10:39 AM

## 2020-07-02 NOTE — Progress Notes (Signed)
Progress Note  Patient Name: [name] Date of Encounter: 07/02/2020  Primary Cardiologist: Pixie Casino, MD   Subjective   75 yo M CAD NOS, HFpEF, 2nd HB s/p Micra with HF after running out of lasix.  Found to have new HFmrEF  Patient notes that he is doing well.  Since day prior notes improvement in his shortness of breath.    No chest pain or pressure since admission.  No SOB but has not gotten up to walk since Saturday.  Notes no weight gain and notes improved leg swelling.  No palpitations or syncope .  Inpatient Medications    Scheduled Meds: . allopurinol  300 mg Oral Daily  . amLODipine  10 mg Oral Daily  . aspirin EC  81 mg Oral Daily  . [START ON 07/03/2020] atorvastatin  40 mg Oral Weekly  . enoxaparin (LOVENOX) injection  40 mg Subcutaneous Q24H  . furosemide  40 mg Intravenous BID  . losartan  100 mg Oral Daily  . pantoprazole  40 mg Oral Daily  . polyethylene glycol  17 g Oral Daily  . potassium chloride  20 mEq Oral BID  . sodium chloride flush  3 mL Intravenous Q12H  . spironolactone  25 mg Oral Daily   Continuous Infusions: . sodium chloride     PRN Meds: sodium chloride, acetaminophen, guaiFENesin-dextromethorphan, magnesium hydroxide, ondansetron (ZOFRAN) IV, sodium chloride flush   Vital Signs    Vitals:   07/02/20 0108 07/02/20 0431 07/02/20 0554 07/02/20 0737  BP: 110/66 (!) 115/52  139/84  Pulse: 63 68  68  Resp: 20 18  (!) 25  Temp: 97.6 F (36.4 C) (!) 97.5 F (36.4 C)  97.9 F (36.6 C)  TempSrc: Oral Oral  Oral  SpO2: 91% 92%  (!) 89%  Weight:   133.9 kg   Height:        Intake/Output Summary (Last 24 hours) at 07/02/2020 0935 Last data filed at 07/02/2020 0800 Gross per 24 hour  Intake 886 ml  Output 2225 ml  Net -1339 ml   Filed Weights   06/30/20 0504 07/01/20 0415 07/02/20 0554  Weight: (!) 136.2 kg 135.2 kg 133.9 kg    Telemetry    SR 1st HB with V Pacing  - Personally Reviewed  ECG    V paced rate 95 -  Personally Reviewed  Physical Exam   GEN: No acute distress.  Obese MAle Neck: Minimal JVD Cardiac: RRR, no murmurs, rubs, or gallops.  Respiratory: Clear to auscultation bilaterally. GI: Soft, nontender, non-distended  MS: +1 edema bilaterally; No deformity. Neuro:  Nonfocal  Psych: Normal affect   Labs    Chemistry Recent Labs  Lab 06/29/20 0040 06/30/20 0021 07/01/20 0100  NA 136 136 136  K 4.7 4.7 4.6  CL 94* 96* 95*  CO2 33* 31 32  GLUCOSE 87 103* 106*  BUN 25* 31* 36*  CREATININE 1.87* 1.94* 1.68*  CALCIUM 10.0 9.9 10.0  GFRNONAA 37* 35* 42*  ANIONGAP 9 9 9      Hematology Recent Labs  Lab 06/26/20 1511  WBC 8.1  RBC 5.13  HGB 15.4  HCT 48.1  MCV 93.8  MCH 30.0  MCHC 32.0  RDW 13.4  PLT 211    Cardiac EnzymesNo results for input(s): TROPONINI in the last 168 hours. No results for input(s): TROPIPOC in the last 168 hours.   BNP Recent Labs  Lab 06/26/20 2343  BNP 264.0*     DDimer No  results for input(s): DDIMER in the last 168 hours.   Radiology    No results found.  Cardiac Studies   Transthoracic Echocardiogram: Date:06/27/2020 Results: decrease in EF from 2018 to 2022 1. Abnormal septal motion distal septal and apical hypokinesis . Left  ventricular ejection fraction, by estimation, is 45 to 50%. The left  ventricle has mildly decreased function. The left ventricle has no  regional wall motion abnormalities. There is  mild left ventricular hypertrophy. Left ventricular diastolic parameters  were normal.  2. Right ventricular systolic function is normal. The right ventricular  size is normal. There is normal pulmonary artery systolic pressure.  3. Left atrial size was mildly dilated.  4. The mitral valve is normal in structure. Trivial mitral valve  regurgitation. No evidence of mitral stenosis.  5. The aortic valve was not well visualized. Aortic valve regurgitation  is not visualized. Mild aortic valve sclerosis is present,  with no  evidence of aortic valve stenosis.  6. The inferior vena cava is normal in size with greater than 50%  respiratory variability, suggesting right atrial pressure of 3 mmHg.   Left/Right Heart Catheterizations: Date: 07/13/2018 Results:  Previously placed Mid RCA stent (unknown type) is widely patent.  Dist RCA lesion is 30% stenosed.  Prox LAD lesion is 35% stenosed.  Previously placed Prox Cx stent (unknown type) is widely patent.  Previously placed Mid Cx stent (unknown type) is widely patent. Dist Cx lesion is 100% stenosed with 100% stenosed side branch in Ost 3rd Mrg.  The left ventricular systolic function is normal.  LV end diastolic pressure is mildly elevated.  The left ventricular ejection fraction is 55-65% by visual estimate.   1. Single vessel occlusive CAD involving the distal LCx. 2. Patent stents in the LAD, RCA and LCx 3. Normal LV function 4. Mildly elevated LVEDP   Patient Profile     75 y.o. male with new HF in the setting of prior CAD and 2nd HB  Assessment & Plan    Heart Failure mildly reduced Ejection Fraction  HB s/p Micra Morbid Obesity, HTN - NYHA class III, Stage C, hypervolemic, etiology from either his CAD or RV pacing - Diuretic regimen: Lasix 80 mg IV BID  - Strict I/Os, daily weights, and fluid restriction of < 2 L  - Replace electrolytes PRN and keep K>4 and Mg>2.  - BB and ARB/ARNI addressed below - aldactone 25 mg PO BID - SGLT2i as outpatient  Coronary Artery Disease; Obstructive/Nonobstructive HLD - asymptomatic:   - anatomy: Prior prox and mid LCx stens patent as of 2020; Dist Cx lesion is 100% stenosed with 100% stenosed side branch in Ost 3rd Mrg. - continue ASA 81 mg - continue statin weekly, goal LDL < 70 - On no BB with the concern that this would increase his pacing and worsen his EF - nitrates as above - Discussed with patient losartan 100 mg transition to Entresto 49/51 mg BID for 07/03/20 start -  discussed cardiac rehab (decompensated HF admission)  Potential 07/04/20 DC if CP and SOB improves and able to transition to PO diuretics 07/03/20  For questions or updates, please contact [facility] Please consult [email or web address] for contact info under Cardiology/STEMI.      Signed, Werner Lean, MD  07/02/2020, 9:35 AM

## 2020-07-02 NOTE — Plan of Care (Signed)
  Problem: Education: Goal: Knowledge of General Education information will improve Description: Including pain rating scale, medication(s)/side effects and non-pharmacologic comfort measures Outcome: Progressing   Problem: Health Behavior/Discharge Planning: Goal: Ability to manage health-related needs will improve Outcome: Progressing   Problem: Clinical Measurements: Goal: Ability to maintain clinical measurements within normal limits will improve Outcome: Progressing Goal: Will remain free from infection Outcome: Progressing Goal: Diagnostic test results will improve Outcome: Progressing Goal: Respiratory complications will improve Outcome: Progressing Goal: Cardiovascular complication will be avoided Outcome: Progressing   Problem: Activity: Goal: Risk for activity intolerance will decrease Outcome: Progressing   Problem: Nutrition: Goal: Adequate nutrition will be maintained Outcome: Progressing   Problem: Coping: Goal: Level of anxiety will decrease Outcome: Progressing   Problem: Elimination: Goal: Will not experience complications related to bowel motility Outcome: Progressing Goal: Will not experience complications related to urinary retention Outcome: Progressing   Problem: Pain Managment: Goal: General experience of comfort will improve Outcome: Progressing   Problem: Safety: Goal: Ability to remain free from injury will improve Outcome: Progressing   Problem: Skin Integrity: Goal: Risk for impaired skin integrity will decrease Outcome: Progressing   Problem: Education: Goal: Ability to demonstrate management of disease process will improve Outcome: Progressing Goal: Ability to verbalize understanding of medication therapies will improve Outcome: Progressing Goal: Individualized Educational Video(s) Outcome: Progressing   Problem: Cardiac: Goal: Ability to achieve and maintain adequate cardiopulmonary perfusion will improve Outcome:  Progressing

## 2020-07-02 NOTE — Progress Notes (Signed)
Occupational Therapy Treatment Patient Details Name: [name] MRN: [id number] DOB: [birth year] Today's Date: 07/02/2020    History of present illness 75 year old male with history of CAD s/p PCI LAD/RCA/LCx, HFpEF, 2nd degree heart block s/p AV-Micra ICP presenting with shortness of breath and chest pain especially with ambulation as well as peripheral edema. Admitted for acute on chronic diastolic heart failure, chest pain and hypertension.   OT comments  Pt making steady progress towards OT goals this session. Pt received seated in recliner reporting no pain and agreeable to OT intervention. Pt completed UB ADLs at sink with RW and min guard. Pt eager to ambulate completing functional mobility greater than a household distance with RW and min guard assist. Pt on RA during session with sats >96% during ambulation. DC plan remains appropriate, will continue to follow pt acutely per POC.    Follow Up Recommendations  No OT follow up    Equipment Recommendations  None recommended by OT    Recommendations for Other Services      Precautions / Restrictions Precautions Precautions: Fall Precaution Comments: O2 sats Restrictions Weight Bearing Restrictions: No       Mobility Bed Mobility               General bed mobility comments: pt recieved in recliner and returned to recliner  Transfers Overall transfer level: Needs assistance Equipment used: Rolling walker (2 wheeled) Transfers: Sit to/from Stand Sit to Stand: Supervision         General transfer comment: supervision for sit<>stand from recliner    Balance Overall balance assessment: Mild deficits observed, not formally tested Sitting-balance support: Feet supported;No upper extremity supported Sitting balance-Leahy Scale: Fair     Standing balance support: Single extremity supported;During functional activity Standing balance-Leahy Scale: Poor Standing balance comment: at least one UE supported during  ADLs                           ADL either performed or assessed with clinical judgement   ADL Overall ADL's : Needs assistance/impaired     Grooming: Wash/dry face;Standing;Min guard;Cueing for safety Grooming Details (indicate cue type and reason): pt stood at sink to wash face with min guard for safety, cues for safety related to RW placement                 Toilet Transfer: Min Marine scientist Details (indicate cue type and reason): simulated via functional mobility with RW and min guard for safety       Tub/Shower Transfer Details (indicate cue type and reason): pt reports walkin shower at home with seat Functional mobility during ADLs: Rolling walker;Min guard General ADL Comments: pt able to complete functional mobility greater than a household distance on RA and complete standing grooming tasks at sink. pt likely nearing baseline level of function     Vision       Perception     Praxis      Cognition Arousal/Alertness: Awake/alert Behavior During Therapy: WFL for tasks assessed/performed Overall Cognitive Status: Within Functional Limits for tasks assessed                                          Exercises     Shoulder Instructions       General Comments pt on 3L at start of session however  doffed O2 for session as pt reports not wearing O2 at baseline. Pt sats WFL during ambulation and UB ADLs with sats 96% during ambulation. left pt on RA- RN aware    Pertinent Vitals/ Pain       Pain Assessment: No/denies pain  Home Living                                          Prior Functioning/Environment              Frequency  Min 1X/week        Progress Toward Goals  OT Goals(current goals can now be found in the care plan section)  Progress towards OT goals: Progressing toward goals  Acute Rehab OT Goals Patient Stated Goal: go home and take care of wife OT Goal Formulation:  With patient Time For Goal Achievement: 07/12/20 Potential to Achieve Goals: Good  Plan Discharge plan remains appropriate;Frequency remains appropriate    Co-evaluation                 AM-PAC OT "6 Clicks" Daily Activity     Outcome Measure   Help from another person eating meals?: None Help from another person taking care of personal grooming?: A Little Help from another person toileting, which includes using toliet, bedpan, or urinal?: A Little Help from another person bathing (including washing, rinsing, drying)?: A Little Help from another person to put on and taking off regular upper body clothing?: None Help from another person to put on and taking off regular lower body clothing?: A Little 6 Click Score: 20    End of Session Equipment Utilized During Treatment: Rolling walker  OT Visit Diagnosis: Unsteadiness on feet (R26.81)   Activity Tolerance Patient tolerated treatment well   Patient Left in chair;with call bell/phone within reach;with chair alarm set   Nurse Communication Mobility status;Other (comment) (left pt on RA)        Time: 4163-8453 OT Time Calculation (min): 23 min  Charges: OT General Charges $OT Visit: 1 Visit OT Treatments $Self Care/Home Management : 23-37 mins  [name], COTA/L Acute Rehabilitation Services [phone]    Precious Haws 07/02/2020, 2:35 PM

## 2020-07-03 DIAGNOSIS — I5020 Unspecified systolic (congestive) heart failure: Secondary | ICD-10-CM | POA: Diagnosis not present

## 2020-07-03 LAB — BASIC METABOLIC PANEL
Anion gap: 12 (ref 5–15)
BUN: 37 mg/dL — ABNORMAL HIGH (ref 8–23)
CO2: 30 mmol/L (ref 22–32)
Calcium: 10.8 mg/dL — ABNORMAL HIGH (ref 8.9–10.3)
Chloride: 94 mmol/L — ABNORMAL LOW (ref 98–111)
Creatinine, Ser: 1.66 mg/dL — ABNORMAL HIGH (ref 0.61–1.24)
GFR, Estimated: 43 mL/min — ABNORMAL LOW (ref >60)
Glucose, Bld: 132 mg/dL — ABNORMAL HIGH (ref 70–99)
Potassium: 4.4 mmol/L (ref 3.5–5.1)
Sodium: 136 mmol/L (ref 135–145)

## 2020-07-03 MED ORDER — INFLUENZA VAC A&B SA ADJ QUAD 0.5 ML IM PRSY
0.5000 mL | PREFILLED_SYRINGE | INTRAMUSCULAR | Status: AC
  Administered 2020-07-04: 0.5 mL via INTRAMUSCULAR
  Filled 2020-07-03: qty 0.5

## 2020-07-03 MED ORDER — FUROSEMIDE 40 MG PO TABS
40.0000 mg | ORAL_TABLET | Freq: Two times a day (BID) | ORAL | Status: DC
  Administered 2020-07-03 – 2020-07-04 (×2): 40 mg via ORAL
  Filled 2020-07-03 (×2): qty 1

## 2020-07-03 MED ORDER — SACUBITRIL-VALSARTAN 24-26 MG PO TABS
1.0000 | ORAL_TABLET | Freq: Two times a day (BID) | ORAL | Status: DC
  Administered 2020-07-03 – 2020-07-04 (×3): 1 via ORAL
  Filled 2020-07-03 (×2): qty 1

## 2020-07-03 NOTE — Progress Notes (Signed)
Progress Note  Patient Name: [name] Date of Encounter: 07/03/2020  Primary Cardiologist: Pixie Casino, MD   Subjective   75 yo M CAD NOS, HFpEF, 2nd HB s/p Micra with HF after running out of lasix.  Found to have new HFmrEF  Patient notes that he is doing well.  Since day prior notes improvement in his shortness of breath.  Feels a little woozy on his entresto.  No chest pain or pressure since admission.  No SOB but has not gotten up to walk since Saturday.  Notes no weight gain and notes improved leg swelling.  No palpitations or syncope .  Inpatient Medications    Scheduled Meds: . allopurinol  300 mg Oral Daily  . amLODipine  10 mg Oral Daily  . aspirin EC  81 mg Oral Daily  . atorvastatin  40 mg Oral Weekly  . enoxaparin (LOVENOX) injection  40 mg Subcutaneous Q24H  . pantoprazole  40 mg Oral Daily  . polyethylene glycol  17 g Oral Daily  . potassium chloride  20 mEq Oral BID  . sacubitril-valsartan  1 tablet Oral BID  . sodium chloride flush  3 mL Intravenous Q12H  . spironolactone  25 mg Oral Daily   Continuous Infusions: . sodium chloride     PRN Meds: sodium chloride, acetaminophen, guaiFENesin-dextromethorphan, magnesium hydroxide, ondansetron (ZOFRAN) IV, sodium chloride flush   Vital Signs    Vitals:   07/02/20 2346 07/03/20 0353 07/03/20 0451 07/03/20 0800  BP: 93/77 (!) 122/54  109/71  Pulse: 65 61  68  Resp: 19 20  (!) 21  Temp: 97.7 F (36.5 C) 97.7 F (36.5 C)  97.7 F (36.5 C)  TempSrc: Oral Oral  Oral  SpO2: 91% 97%    Weight:   133 kg   Height:        Intake/Output Summary (Last 24 hours) at 07/03/2020 0909 Last data filed at 07/03/2020 0800 Gross per 24 hour  Intake 450 ml  Output 3080 ml  Net -2630 ml   Filed Weights   07/01/20 0415 07/02/20 0554 07/03/20 0451  Weight: 135.2 kg 133.9 kg 133 kg    Telemetry    1st HB and V paced  - Personally Reviewed  ECG    No new - Personally Reviewed  Physical Exam   GEN:  No acute distress.  Obese Male Neck: Minimal JVD Cardiac: RRR, no murmurs, rubs, or gallops.  Respiratory: Clear to auscultation bilaterally. GI: Soft, nontender, non-distended  MS: +1 edema bilaterally; No deformity. Neuro:  Nonfocal  Psych: Normal affect   Labs    Chemistry Recent Labs  Lab 06/29/20 0040 06/30/20 0021 07/01/20 0100  NA 136 136 136  K 4.7 4.7 4.6  CL 94* 96* 95*  CO2 33* 31 32  GLUCOSE 87 103* 106*  BUN 25* 31* 36*  CREATININE 1.87* 1.94* 1.68*  CALCIUM 10.0 9.9 10.0  GFRNONAA 37* 35* 42*  ANIONGAP 9 9 9      Hematology Recent Labs  Lab 06/26/20 1511  WBC 8.1  RBC 5.13  HGB 15.4  HCT 48.1  MCV 93.8  MCH 30.0  MCHC 32.0  RDW 13.4  PLT 211    Cardiac EnzymesNo results for input(s): TROPONINI in the last 168 hours. No results for input(s): TROPIPOC in the last 168 hours.   BNP Recent Labs  Lab 06/26/20 2343  BNP 264.0*     DDimer No results for input(s): DDIMER in the last 168 hours.  Radiology    No results found.  Cardiac Studies   Transthoracic Echocardiogram: Date:06/27/2020 Results: decrease in EF from 2018 to 2022 1. Abnormal septal motion distal septal and apical hypokinesis . Left  ventricular ejection fraction, by estimation, is 45 to 50%. The left  ventricle has mildly decreased function. The left ventricle has no  regional wall motion abnormalities. There is  mild left ventricular hypertrophy. Left ventricular diastolic parameters  were normal.  2. Right ventricular systolic function is normal. The right ventricular  size is normal. There is normal pulmonary artery systolic pressure.  3. Left atrial size was mildly dilated.  4. The mitral valve is normal in structure. Trivial mitral valve  regurgitation. No evidence of mitral stenosis.  5. The aortic valve was not well visualized. Aortic valve regurgitation  is not visualized. Mild aortic valve sclerosis is present, with no  evidence of aortic valve stenosis.   6. The inferior vena cava is normal in size with greater than 50%  respiratory variability, suggesting right atrial pressure of 3 mmHg.   Left/Right Heart Catheterizations: Date: 07/13/2018 Results:  Previously placed Mid RCA stent (unknown type) is widely patent.  Dist RCA lesion is 30% stenosed.  Prox LAD lesion is 35% stenosed.  Previously placed Prox Cx stent (unknown type) is widely patent.  Previously placed Mid Cx stent (unknown type) is widely patent. Dist Cx lesion is 100% stenosed with 100% stenosed side branch in Ost 3rd Mrg.  The left ventricular systolic function is normal.  LV end diastolic pressure is mildly elevated.  The left ventricular ejection fraction is 55-65% by visual estimate.   1. Single vessel occlusive CAD involving the distal LCx. 2. Patent stents in the LAD, RCA and LCx 3. Normal LV function 4. Mildly elevated LVEDP   Patient Profile     75 y.o. male with new HF in the setting of prior CAD and 2nd HB  Assessment & Plan    Heart Failure mildly reduced Ejection Fraction  HB s/p Micra Morbid Obesity, HTN - NYHA class III, Stage C, euvolemic, etiology from either his CAD or RV pacing - Diuretic regimen: Lasix 40 mg PO BID starting today - Strict I/Os, daily weights, and fluid restriction of < 2 L  - Replace electrolytes PRN and keep K>4 and Mg>2.  - BB and ARB/ARNI addressed below - aldactone 25 mg PO BID - SGLT2i as outpatient  Coronary Artery Disease; Obstructive/Nonobstructive HLD - asymptomatic:   - anatomy: Prior prox and mid LCx stens patent as of 2020; Dist Cx lesion is 100% stenosed with 100% stenosed side branch in Ost 3rd Mrg. - continue ASA 81 mg - continue statin weekly, goal LDL < 70 - On no BB with the concern that this would increase his pacing and worsen his EF - nitrates as above - Will decrease in low dose entresto - discussed cardiac rehab (decompensated HF admission)  Potential 07/04/20 DC   For questions or  updates, please contact [facility] Please consult [email or web address] for contact info under Cardiology/STEMI.      Signed, Werner Lean, MD  07/03/2020, 9:09 AM

## 2020-07-03 NOTE — Plan of Care (Signed)
  Problem: Education: Goal: Knowledge of General Education information will improve Description: Including pain rating scale, medication(s)/side effects and non-pharmacologic comfort measures Outcome: Progressing   Problem: Health Behavior/Discharge Planning: Goal: Ability to manage health-related needs will improve Outcome: Progressing   Problem: Clinical Measurements: Goal: Ability to maintain clinical measurements within normal limits will improve Outcome: Progressing Goal: Will remain free from infection Outcome: Progressing Goal: Diagnostic test results will improve Outcome: Progressing Goal: Respiratory complications will improve Outcome: Progressing Goal: Cardiovascular complication will be avoided Outcome: Progressing   Problem: Activity: Goal: Risk for activity intolerance will decrease Outcome: Progressing   Problem: Nutrition: Goal: Adequate nutrition will be maintained Outcome: Progressing   Problem: Coping: Goal: Level of anxiety will decrease Outcome: Progressing   Problem: Elimination: Goal: Will not experience complications related to bowel motility Outcome: Progressing Goal: Will not experience complications related to urinary retention Outcome: Progressing   Problem: Pain Managment: Goal: General experience of comfort will improve Outcome: Progressing   Problem: Safety: Goal: Ability to remain free from injury will improve Outcome: Progressing   Problem: Skin Integrity: Goal: Risk for impaired skin integrity will decrease Outcome: Progressing   Problem: Education: Goal: Ability to demonstrate management of disease process will improve Outcome: Progressing Goal: Ability to verbalize understanding of medication therapies will improve Outcome: Progressing Goal: Individualized Educational Video(s) Outcome: Progressing   Problem: Cardiac: Goal: Ability to achieve and maintain adequate cardiopulmonary perfusion will improve Outcome:  Progressing

## 2020-07-03 NOTE — Plan of Care (Signed)
  Problem: Education: Goal: Knowledge of General Education information will improve Description: Including pain rating scale, medication(s)/side effects and non-pharmacologic comfort measures Outcome: Progressing   Problem: Clinical Measurements: Goal: Ability to maintain clinical measurements within normal limits will improve Outcome: Progressing   Problem: Clinical Measurements: Goal: Will remain free from infection Outcome: Progressing   Problem: Clinical Measurements: Goal: Respiratory complications will improve Outcome: Progressing   Problem: Nutrition: Goal: Adequate nutrition will be maintained Outcome: Progressing   Problem: Elimination: Goal: Will not experience complications related to urinary retention Outcome: Progressing   Problem: Safety: Goal: Ability to remain free from injury will improve Outcome: Progressing   Problem: Education: Goal: Ability to demonstrate management of disease process will improve Outcome: Progressing   Problem: Cardiac: Goal: Ability to achieve and maintain adequate cardiopulmonary perfusion will improve Outcome: Progressing   Problem: Education: Goal: Ability to verbalize understanding of medication therapies will improve Outcome: Progressing

## 2020-07-04 ENCOUNTER — Telehealth: Payer: Self-pay | Admitting: Internal Medicine

## 2020-07-04 DIAGNOSIS — I5043 Acute on chronic combined systolic (congestive) and diastolic (congestive) heart failure: Secondary | ICD-10-CM | POA: Diagnosis not present

## 2020-07-04 LAB — BASIC METABOLIC PANEL
Anion gap: 9 (ref 5–15)
BUN: 42 mg/dL — ABNORMAL HIGH (ref 8–23)
CO2: 29 mmol/L (ref 22–32)
Calcium: 10.6 mg/dL — ABNORMAL HIGH (ref 8.9–10.3)
Chloride: 97 mmol/L — ABNORMAL LOW (ref 98–111)
Creatinine, Ser: 1.77 mg/dL — ABNORMAL HIGH (ref 0.61–1.24)
GFR, Estimated: 40 mL/min — ABNORMAL LOW (ref >60)
Glucose, Bld: 139 mg/dL — ABNORMAL HIGH (ref 70–99)
Potassium: 4.3 mmol/L (ref 3.5–5.1)
Sodium: 135 mmol/L (ref 135–145)

## 2020-07-04 LAB — MAGNESIUM: Magnesium: 2.3 mg/dL (ref 1.7–2.4)

## 2020-07-04 MED ORDER — SPIRONOLACTONE 25 MG PO TABS: 25.0000 mg | ORAL_TABLET | Freq: Every day | ORAL | 3 refills | Status: DC

## 2020-07-04 MED ORDER — SACUBITRIL-VALSARTAN 24-26 MG PO TABS: 1.0000 | ORAL_TABLET | Freq: Two times a day (BID) | ORAL | 6 refills | Status: DC

## 2020-07-04 MED ORDER — ATORVASTATIN CALCIUM 40 MG PO TABS: 40.0000 mg | ORAL_TABLET | ORAL | 3 refills | Status: DC

## 2020-07-04 MED ORDER — POTASSIUM CHLORIDE CRYS ER 20 MEQ PO TBCR: 20.0000 meq | EXTENDED_RELEASE_TABLET | Freq: Two times a day (BID) | ORAL | 6 refills | Status: DC

## 2020-07-04 MED ORDER — FUROSEMIDE 40 MG PO TABS: 40.0000 mg | ORAL_TABLET | Freq: Two times a day (BID) | ORAL | 3 refills | Status: DC

## 2020-07-04 MED FILL — ATORVASTATIN CALCIUM 40 MG: 40 | 28 days supply | Qty: 4 | Fill #0

## 2020-07-04 MED FILL — ENTRESTO 24 MG-26 MG TABLET: 24-26 | 30 days supply | Qty: 60 | Fill #0

## 2020-07-04 MED FILL — FUROSEMIDE 40 MG TABLET: 40 | 30 days supply | Qty: 60 | Fill #0

## 2020-07-04 MED FILL — SPIRONOLACTONE 25 MG TABLET: 25 | 30 days supply | Qty: 30 | Fill #0

## 2020-07-04 MED FILL — POTASSIUM CHLORIDE 20meqER: 20 | 30 days supply | Qty: 60 | Fill #0

## 2020-07-04 NOTE — Discharge Instructions (Signed)
Medication changes: - STOP losartan and hydrochlorothiazide (HCTZ) - INCREASE lasix to 40mg  two times per day and atorvastatin to 40 mg weekly - START spironolactone 25mg  daily and entresto 24-26mg  daily  Heart Failure Education: 1. Weigh yourself EVERY morning after you go to the bathroom but before you eat or drink anything. Write this number down in a weight log/diary. If you gain 3 pounds overnight or 5 pounds in a week, call the office. 2. Take your medicines as prescribed. If you have concerns about your medications, please call us before you stop taking them.  3. Eat low salt foods--Limit salt (sodium) to 2000 mg per day. This will help prevent your body from holding onto fluid. Read food labels as many processed foods have a lot of sodium, especially canned goods and prepackaged meats. If you would like some assistance choosing low sodium foods, we would be happy to set you up with a nutritionist. 4. Limit your fluid intake to 2L per day (around 8, 8oz cups) - this includes all your liquids including tea, coffee, water, juice, sodas, alcohol, and even soups.  5. Stay as active as you can everyday. Staying active will give you more energy and make your muscles stronger. Start with 5 minutes at a time and work your way up to 30 minutes a day. Break up your activities--do some in the morning and some in the afternoon. Start with 3 days per week and work your way up to 5 days as you can.  If you have chest pain, feel short of breath, dizzy, or lightheaded, STOP. If you don't feel better after a short rest, call 911. If you do feel better, call the office to let us know you have symptoms with exercise. 6. Limit all fluids for the day to less than 2 liters. Fluid includes all drinks, coffee, juice, ice chips, soup, jello, and all other liquids.

## 2020-07-04 NOTE — Telephone Encounter (Signed)
Patient discharged today 07/04/2020 First TOC outreach attempt should be 07/05/2020

## 2020-07-04 NOTE — Telephone Encounter (Signed)
-----   Message from Abigail Butts, PA-C sent at 07/04/2020 10:40 AM EST ----- Regarding: TOC visit Hey there! This patient has an upcoming visit with Dr. Debara Pickett 07/16/20. Can you please place a TOC call to the patient? Thank you!

## 2020-07-04 NOTE — Plan of Care (Signed)
  Problem: Education: Goal: Knowledge of General Education information will improve Description: Including pain rating scale, medication(s)/side effects and non-pharmacologic comfort measures Outcome: Adequate for Discharge   Problem: Health Behavior/Discharge Planning: Goal: Ability to manage health-related needs will improve Outcome: Adequate for Discharge   Problem: Clinical Measurements: Goal: Will remain free from infection Outcome: Adequate for Discharge   Problem: Nutrition: Goal: Adequate nutrition will be maintained Outcome: Adequate for Discharge   Problem: Elimination: Goal: Will not experience complications related to urinary retention Outcome: Adequate for Discharge   Problem: Pain Managment: Goal: General experience of comfort will improve Outcome: Adequate for Discharge   Problem: Safety: Goal: Ability to remain free from injury will improve Outcome: Adequate for Discharge   Problem: Skin Integrity: Goal: Risk for impaired skin integrity will decrease Outcome: Adequate for Discharge   Problem: Education: Goal: Ability to verbalize understanding of medication therapies will improve Outcome: Adequate for Discharge

## 2020-07-04 NOTE — Progress Notes (Signed)
Physical Therapy Treatment Patient Details Name: [name] MRN: [id number] DOB: [birth year] Today's Date: 07/04/2020    History of Present Illness 75 year old male with history of CAD s/p PCI LAD/RCA/LCx, HFpEF, 2nd degree heart block s/p AV-Micra ICP presenting with shortness of breath and chest pain especially with ambulation as well as peripheral edema. Admitted for acute on chronic diastolic heart failure, chest pain and hypertension.    PT Comments    Pt sitting in recliner on entry. Able to reach forward and put his socks on although reports back pain afterwards and states he uses a SockAid at home. Pt supervision for transfers and min guard for 400 feet ambulation with RW. Pt with slight SoB with ambulation however is able to maintain good oxygen saturation. Pt is hopeful for d/c home today. PT recommended he continue to use his RW for safety until he starts HHPT at home. Pt in agreement.     Follow Up Recommendations  Home health PT;Supervision for mobility/OOB     Equipment Recommendations  None recommended by PT (has necessary equipment)    Recommendations for Other Services       Precautions / Restrictions Precautions Precautions: Fall Precaution Comments: 1x in last 6 months had fall with broken LE Restrictions Other Position/Activity Restrictions: O2 desaturation    Mobility  Bed Mobility               General bed mobility comments: OOB in recliner  Transfers Overall transfer level: Needs assistance Equipment used: None Transfers: Sit to/from Stand Sit to Stand: Supervision         General transfer comment: supervsion for safety, good power up and steadying  Ambulation/Gait Ambulation/Gait assistance: Min guard Gait Distance (Feet): 400 Feet Assistive device: Rolling walker (2 wheeled) Gait Pattern/deviations: Step-through pattern;Decreased step length - right;Decreased step length - left;Trunk flexed Gait velocity: slowed Gait velocity  interpretation: 1.31 - 2.62 ft/sec, indicative of limited community ambulator General Gait Details: min guard for safety with strong, steady  gait, slowed with distance, given vc for upright posture      Balance Overall balance assessment: Needs assistance Sitting-balance support: Feet supported;No upper extremity supported Sitting balance-Leahy Scale: Good Sitting balance - Comments: able to reach forward and put socks on   Standing balance support: No upper extremity supported;During functional activity Standing balance-Leahy Scale: Good                              Cognition Arousal/Alertness: Awake/alert Behavior During Therapy: WFL for tasks assessed/performed Overall Cognitive Status: Within Functional Limits for tasks assessed                                           General Comments General comments (skin integrity, edema, etc.): Pt on RA with slight SoB at end of ambulation SaO2 91%O2, HR 78bpm      Pertinent Vitals/Pain Pain Assessment: No/denies pain           PT Goals (current goals can now be found in the care plan section) Acute Rehab PT Goals Patient Stated Goal: go home and take care of wife PT Goal Formulation: With patient Time For Goal Achievement: 07/11/20 Potential to Achieve Goals: Good Progress towards PT goals: Progressing toward goals    Frequency    Min 3X/week      PT  Plan Current plan remains appropriate    Co-evaluation              AM-PAC PT "6 Clicks" Mobility   Outcome Measure  Help needed turning from your back to your side while in a flat bed without using bedrails?: None Help needed moving from lying on your back to sitting on the side of a flat bed without using bedrails?: None Help needed moving to and from a bed to a chair (including a wheelchair)?: A Little Help needed standing up from a chair using your arms (e.g., wheelchair or bedside chair)?: A Little Help needed to walk in  hospital room?: A Little Help needed climbing 3-5 steps with a railing? : A Lot 6 Click Score: 19    End of Session Equipment Utilized During Treatment: Gait belt;Oxygen Activity Tolerance: Patient tolerated treatment well Patient left: in chair;with call bell/phone within reach Nurse Communication: Mobility status PT Visit Diagnosis: Unsteadiness on feet (R26.81);Other abnormalities of gait and mobility (R26.89);Muscle weakness (generalized) (M62.81);History of falling (Z91.81);Repeated falls (R29.6);Difficulty in walking, not elsewhere classified (R26.2);Dizziness and giddiness (R42)     Time: 3818-4037 PT Time Calculation (min) (ACUTE ONLY): 12 min  Charges:  $Therapeutic Exercise: 8-22 mins                     Dominiq Fontaine B. Migdalia Dk PT, DPT Acute Rehabilitation Services Pager [phone] Office [phone]    [place] 07/04/2020, 4:23 PM

## 2020-07-04 NOTE — TOC Progression Note (Addendum)
Transition of Care [facility]) - Progression Note    Patient Details  Name: [name] MRN: [id number] Date of Birth: [birth year]  Transition of Care [facility]) CM/SW Contact  Zenon Mayo, RN Phone Number: 07/04/2020, 2:48 PM  Clinical Narrative:    NCM spoke with patient , offered choice he states, he does not have a preference, for [place], [place]. NCM made referral to [place] with [facility], awaiting call back. He his for dc home today. NCM received call back from [place], stating she can take referral for [facility], HHPT, soc will begin with 3 to 5 day delay due to wheather.  NCM informed patient of this information.        Expected Discharge Plan and Services           Expected Discharge Date: 07/04/20                                     Social Determinants of Health (SDOH) Interventions    Readmission Risk Interventions No flowsheet data found.

## 2020-07-04 NOTE — Discharge Summary (Addendum)
Discharge Summary    Patient ID: [name] MRN: [id number]; DOB: 08-Sep-1945  Admit date: 06/26/2020 Discharge date: 07/04/2020  Primary Care Provider: Maury Dus, MD  Primary Cardiologist: Pixie Casino, MD  Primary Electrophysiologist:  None   Discharge Diagnoses    Principal Problem:   Acute on chronic combined systolic and diastolic CHF (congestive heart failure) ([place]) Active Problems:   Obesity (BMI 30-39.9)   CAD (coronary artery disease)   OSA (obstructive sleep apnea)   Hyperlipidemia   Second degree AV block    Diagnostic Studies/Procedures    Echocardiogram 06/27/2020: 1. ABnormal septal motion distal septal and apical hypokinesis . Left  ventricular ejection fraction, by estimation, is 45 to 50%. The left  ventricle has mildly decreased function. The left ventricle has no  regional wall motion abnormalities. There is  mild left ventricular hypertrophy. Left ventricular diastolic parameters  were normal.   2. Right ventricular systolic function is normal. The right ventricular  size is normal. There is normal pulmonary artery systolic pressure.   3. Left atrial size was mildly dilated.   4. The mitral valve is normal in structure. Trivial mitral valve  regurgitation. No evidence of mitral stenosis.   5. The aortic valve was not well visualized. Aortic valve regurgitation  is not visualized. Mild aortic valve sclerosis is present, with no  evidence of aortic valve stenosis.   6. The inferior vena cava is normal in size with greater than 50%  respiratory variability, suggesting right atrial pressure of 3 mmHg.  _____________   History of Present Illness     [name] is a 75 y.o. male with a PMH of CAD s/p PCI to RCA, pLCx and mLCx, and LAD, 2nd degree HB s/p AV-Micra, HTN, HLD, OSA on CPAP, and obesity, who presented   Hospital Course     Consultants: None   1. Acute on chronic CHF with new mildly reduced EF: patient presented with SOB and  chest pain. He was felt to be volume overloaded on exam. BNP elevated to 264. CXR with minimal left basilar atelectasis. Echo this admission showed E45-50%, no RWMA, mild LVH, mild LAE, and no significant valvular abnormalities.  He was started on IV lasix for diuresis. UOP net -9L this admission with weight 294lbs on the day of discharge. He was transitioned to po lasix 40mg  BID prior to discharge. Home losartan and HCTZ were discontinued and he was started on spironolactone 25mg  daily and low dose entresto. BBlocker not started given concerns this would increase his pacing and worsen his EF. Patient ambulated on the day of discharge without difficulty and deemed stable for discharge home by Dr. Gasper Sells. - Continue lasix 40mg  BID with po potassium 20 mEq BID - plan to repeat BMET at follow-up for close monitoring - Continue spironolactone 25mg  daily - Continue entresto and uptitrate outpatient as tolerated - Low sodium diet and fluid restriction of 2L per day was encouraged.  - Consider addition of SGLT2 inhibitor outpatient   2. CAD with history of PCI to LAD, RCA, and LCx: HsTrops were negative x3. EKG was V-paced. Echo showed EF 45-50% without RWMA.  Ischemic evaluation not pursued this admission. Home atorvastatin increased from 20mg  weekly to 40mg  weekly - Continue aspirin and statin  3. HTN: BP stable with the above adjustments - Managed in the context of #1  4. HLD: LDL 83 this admission, home atorvastatin increased from 20mg  weekly to 40mg  weekly. - Continue statin   5. OSA: questionable  compliance with home CPAP - Continue to encourage CPAP compliance    Did the patient have an acute coronary syndrome (MI, NSTEMI, STEMI, etc) this admission?:  No                               Did the patient have a percutaneous coronary intervention (stent / angioplasty)?:  No.       _____________  Discharge Vitals Blood pressure 102/61, pulse 71, temperature 98.6 F (37 C), temperature  source Oral, resp. rate 15, height 6\' 2"  (1.88 m), weight 133.6 kg, SpO2 93 %.  Filed Weights   07/02/20 0554 07/03/20 0451 07/04/20 0800  Weight: 133.9 kg 133 kg 133.6 kg    Labs & Radiologic Studies    CBC No results for input(s): WBC, NEUTROABS, HGB, HCT, MCV, PLT in the last 72 hours. Basic Metabolic Panel Recent Labs    07/03/20 0917 07/04/20 0033  NA 136 135  K 4.4 4.3  CL 94* 97*  CO2 30 29  GLUCOSE 132* 139*  BUN 37* 42*  CREATININE 1.66* 1.77*  CALCIUM 10.8* 10.6*  MG  --  2.3   Liver Function Tests No results for input(s): AST, ALT, ALKPHOS, BILITOT, PROT, ALBUMIN in the last 72 hours. No results for input(s): LIPASE, AMYLASE in the last 72 hours. High Sensitivity Troponin:   Recent Labs  Lab 06/26/20 1511 06/26/20 1711 06/26/20 2238  TROPONINIHS 7 9 9     BNP Invalid input(s): POCBNP D-Dimer No results for input(s): DDIMER in the last 72 hours. Hemoglobin A1C No results for input(s): HGBA1C in the last 72 hours. Fasting Lipid Panel No results for input(s): CHOL, HDL, LDLCALC, TRIG, CHOLHDL, LDLDIRECT in the last 72 hours. Thyroid Function Tests No results for input(s): TSH, T4TOTAL, T3FREE, THYROIDAB in the last 72 hours.  Invalid input(s): FREET3 _____________  DG Chest Portable 1 View  Result Date: 06/26/2020 CLINICAL DATA:  Chest pain. EXAM: PORTABLE CHEST 1 VIEW COMPARISON:  May 18, 2019. FINDINGS: Stable cardiomegaly. No pneumothorax or pleural effusion is noted. Right lung is clear. Minimal left basilar subsegmental atelectasis is noted. Bony thorax is unremarkable. IMPRESSION: Minimal left basilar subsegmental atelectasis. Electronically Signed   By: Marijo Conception M.D.   On: 06/26/2020 15:45   ECHOCARDIOGRAM COMPLETE  Result Date: 06/27/2020    ECHOCARDIOGRAM REPORT   Patient Name:   [name] [facility] Date of Exam: 06/27/2020 Medical Rec #:  [id number]       Height:       74.0 in Accession #:    [id number]      Weight:       307.3 lb Date of  Birth:  1945-11-27       BSA:          2.609 m Patient Age:    75 years        BP:           168/87 mmHg Patient Gender: M               HR:           84 bpm. Exam Location:  Inpatient Procedure: 2D Echo, Color Doppler and Cardiac Doppler Indications:    Cardiomyopathy-Ischemic I25.5  History:        Patient has prior history of Echocardiogram examinations, most                 recent 04/15/2017. CAD and Previous Myocardial  Infarction; Risk                 Factors:Former Smoker, Hypertension and Dyslipidemia.  Sonographer:    Bernadene Person RDCS Referring Phys: [id number] [place]  1. ABnormal septal motion distal septal and apical hypokinesis . Left ventricular ejection fraction, by estimation, is 45 to 50%. The left ventricle has mildly decreased function. The left ventricle has no regional wall motion abnormalities. There is mild left ventricular hypertrophy. Left ventricular diastolic parameters were normal.  2. Right ventricular systolic function is normal. The right ventricular size is normal. There is normal pulmonary artery systolic pressure.  3. Left atrial size was mildly dilated.  4. The mitral valve is normal in structure. Trivial mitral valve regurgitation. No evidence of mitral stenosis.  5. The aortic valve was not well visualized. Aortic valve regurgitation is not visualized. Mild aortic valve sclerosis is present, with no evidence of aortic valve stenosis.  6. The inferior vena cava is normal in size with greater than 50% respiratory variability, suggesting right atrial pressure of 3 mmHg. FINDINGS  Left Ventricle: ABnormal septal motion distal septal and apical hypokinesis. Left ventricular ejection fraction, by estimation, is 45 to 50%. The left ventricle has mildly decreased function. The left ventricle has no regional wall motion abnormalities.  The left ventricular internal cavity size was normal in size. There is mild left ventricular hypertrophy. Left ventricular diastolic  parameters were normal. Right Ventricle: The right ventricular size is normal. No increase in right ventricular wall thickness. Right ventricular systolic function is normal. There is normal pulmonary artery systolic pressure. The tricuspid regurgitant velocity is 1.67 m/s, and  with an assumed right atrial pressure of 3 mmHg, the estimated right ventricular systolic pressure is 58.5 mmHg. Left Atrium: Left atrial size was mildly dilated. Right Atrium: Right atrial size was normal in size. Pericardium: There is no evidence of pericardial effusion. Mitral Valve: The mitral valve is normal in structure. Trivial mitral valve regurgitation. No evidence of mitral valve stenosis. Tricuspid Valve: The tricuspid valve is normal in structure. Tricuspid valve regurgitation is trivial. No evidence of tricuspid stenosis. Aortic Valve: The aortic valve was not well visualized. Aortic valve regurgitation is not visualized. Mild aortic valve sclerosis is present, with no evidence of aortic valve stenosis. Pulmonic Valve: The pulmonic valve was normal in structure. Pulmonic valve regurgitation is not visualized. No evidence of pulmonic stenosis. Aorta: The aortic root is normal in size and structure. Venous: The inferior vena cava is normal in size with greater than 50% respiratory variability, suggesting right atrial pressure of 3 mmHg. IAS/Shunts: No atrial level shunt detected by color flow Doppler.  LEFT VENTRICLE PLAX 2D LVIDd:         5.60 cm  Diastology LVIDs:         3.40 cm  LV e' medial:    4.78 cm/s LV PW:         1.20 cm  LV E/e' medial:  18.1 LV IVS:        1.30 cm  LV e' lateral:   5.92 cm/s LVOT diam:     2.00 cm  LV E/e' lateral: 14.6 LV SV:         79 LV SV Index:   30 LVOT Area:     3.14 cm  RIGHT VENTRICLE RV S prime:     11.80 cm/s TAPSE (M-mode): 1.7 cm LEFT ATRIUM              Index  RIGHT ATRIUM           Index LA diam:        4.10 cm  1.57 cm/m  RA Area:     17.70 cm LA Vol (A2C):   94.8 ml  36.34  ml/m RA Volume:   48.50 ml  18.59 ml/m LA Vol (A4C):   107.0 ml 41.01 ml/m LA Biplane Vol: 108.0 ml 41.40 ml/m  AORTIC VALVE LVOT Vmax:   119.00 cm/s LVOT Vmean:  85.000 cm/s LVOT VTI:    0.250 m  AORTA Ao Root diam: 3.50 cm Ao Asc diam:  3.20 cm MITRAL VALVE               TRICUSPID VALVE MV Area (PHT): 2.80 cm    TR Peak grad:   11.2 mmHg MV Decel Time: 271 msec    TR Vmax:        167.00 cm/s MV E velocity: 86.30 cm/s MV A velocity: 69.80 cm/s  SHUNTS MV E/A ratio:  1.24        Systemic VTI:  0.25 m                            Systemic Diam: 2.00 cm Jenkins Rouge MD Electronically signed by Jenkins Rouge MD Signature Date/Time: 06/27/2020/12:15:20 PM    Final    Disposition   Pt is being discharged home today in good condition.  Follow-up Plans & Appointments     Follow-up Information     Pixie Casino, MD Follow up on 07/16/2020.   Specialty: Cardiology Why: Please arrive 15 minutes early for your 8:30am post-hospital cardiology appointment Contact information: [place] [address] [phone]                   Discharge Medications   Allergies as of 07/04/2020       Reactions   Prednisone Swelling        Medication List     STOP taking these medications    hydrochlorothiazide 25 MG tablet Commonly known as: HYDRODIURIL   losartan 100 MG tablet Commonly known as: COZAAR       TAKE these medications    acetaminophen 500 MG tablet Commonly known as: TYLENOL Take 500 mg by mouth every 6 (six) hours as needed for moderate pain.   albuterol 108 (90 Base) MCG/ACT inhaler Commonly known as: VENTOLIN HFA Inhale 1-2 puffs into the lungs every 6 (six) hours as needed for wheezing or shortness of breath.   allopurinol 300 MG tablet Commonly known as: ZYLOPRIM Take 300 mg by mouth Daily.   amLODipine 10 MG tablet Commonly known as: NORVASC Take 10 mg by mouth Daily.   Apple Cider Vinegar 300 MG Tabs Take 2 tablets by mouth  daily.   aspirin 81 MG EC tablet Take 1 tablet (81 mg total) by mouth daily.   atorvastatin 40 MG tablet Commonly known as: LIPITOR Take 1 tablet (40 mg total) by mouth once a week. Start taking on: July 10, 2020 What changed:  medication strength how much to take   diphenhydrAMINE 25 MG tablet Commonly known as: BENADRYL Take 50 mg by mouth 2 (two) times daily as needed for allergies.   furosemide 40 MG tablet Commonly known as: LASIX Take 1 tablet (40 mg total) by mouth 2 (two) times daily. What changed:  medication strength how much to take when to take this   ondansetron  8 MG tablet Commonly known as: ZOFRAN Take 4-8 mg by mouth every 8 (eight) hours as needed for nausea or vomiting.   pantoprazole 40 MG tablet Commonly known as: PROTONIX Take 40 mg by mouth daily.   potassium chloride SA 20 MEQ tablet Commonly known as: KLOR-CON Take 1 tablet (20 mEq total) by mouth 2 (two) times daily.   sacubitril-valsartan 24-26 MG Commonly known as: ENTRESTO Take 1 tablet by mouth 2 (two) times daily.   spironolactone 25 MG tablet Commonly known as: ALDACTONE Take 1 tablet (25 mg total) by mouth daily. Start taking on: July 05, 2020           Outstanding Labs/Studies   Check BMET at follow-up  Duration of Discharge Encounter   Greater than 30 minutes including physician time.  Signed, Abigail Butts, PA-C 07/04/2020, 1:08 PM  Personally seen and examined. Agree with APP above with the following comments: Briefly 75 yo M with SOB and new HFmrEF in the setting of frequent pacing and known prior CAD Patient notes significant improvement with his lasix medication Exam notable for significant weight loss, DC weight 133 kg Labs notable for slight increase in creatinine but with in his range (1.6-1/9) Would recommend Follow up BMP and discharge on Entresto (noted wooziness at high dose, but in the setting of IV diuresis); reasonable to re-challenge high  dose again  (was on losartan 100 mg prior to this)  Werner Lean, MD

## 2020-07-06 NOTE — Telephone Encounter (Signed)
Attempted to contact pt. Unable to leave message.

## 2020-07-10 NOTE — Telephone Encounter (Signed)
Tried to call pt received message "call cannot be completed at this time" will have to call again later

## 2020-07-12 NOTE — Telephone Encounter (Signed)
Attempted to contact patient to discuss TOC- patient did not answer. Unable to leave voicemail.

## 2020-07-15 ENCOUNTER — Other Ambulatory Visit: Payer: Self-pay

## 2020-07-15 ENCOUNTER — Emergency Department (HOSPITAL_COMMUNITY): Payer: Medicare HMO

## 2020-07-15 ENCOUNTER — Emergency Department (HOSPITAL_COMMUNITY)
Admission: EM | Admit: 2020-07-15 | Discharge: 2020-07-16 | Disposition: A | Discharge status: Home / Self Care | Payer: Medicare HMO | Attending: Emergency Medicine | Admitting: Emergency Medicine

## 2020-07-15 DIAGNOSIS — Z8546 Personal history of malignant neoplasm of prostate: Secondary | ICD-10-CM | POA: Insufficient documentation

## 2020-07-15 DIAGNOSIS — Z955 Presence of coronary angioplasty implant and graft: Secondary | ICD-10-CM | POA: Insufficient documentation

## 2020-07-15 DIAGNOSIS — R059 Cough, unspecified: Secondary | ICD-10-CM

## 2020-07-15 DIAGNOSIS — J029 Acute pharyngitis, unspecified: Secondary | ICD-10-CM | POA: Diagnosis not present

## 2020-07-15 DIAGNOSIS — R079 Chest pain, unspecified: Secondary | ICD-10-CM

## 2020-07-15 DIAGNOSIS — I517 Cardiomegaly: Secondary | ICD-10-CM | POA: Diagnosis not present

## 2020-07-15 DIAGNOSIS — I10 Essential (primary) hypertension: Secondary | ICD-10-CM | POA: Insufficient documentation

## 2020-07-15 DIAGNOSIS — I2510 Atherosclerotic heart disease of native coronary artery without angina pectoris: Secondary | ICD-10-CM | POA: Insufficient documentation

## 2020-07-15 DIAGNOSIS — Z950 Presence of cardiac pacemaker: Secondary | ICD-10-CM | POA: Diagnosis not present

## 2020-07-15 DIAGNOSIS — R0602 Shortness of breath: Secondary | ICD-10-CM

## 2020-07-15 DIAGNOSIS — Z87891 Personal history of nicotine dependence: Secondary | ICD-10-CM | POA: Diagnosis not present

## 2020-07-15 DIAGNOSIS — U071 COVID-19: Secondary | ICD-10-CM | POA: Insufficient documentation

## 2020-07-15 LAB — CBC
HCT: 49.7 % (ref 39.0–52.0)
Hemoglobin: 15.7 g/dL (ref 13.0–17.0)
MCH: 29.6 pg (ref 26.0–34.0)
MCHC: 31.6 g/dL (ref 30.0–36.0)
MCV: 93.6 fL (ref 80.0–100.0)
Platelets: 209 10*3/uL (ref 150–400)
RBC: 5.31 MIL/uL (ref 4.22–5.81)
RDW: 13.4 % (ref 11.5–15.5)
WBC: 8.1 10*3/uL (ref 4.0–10.5)
nRBC: 0 % (ref 0.0–0.2)

## 2020-07-15 LAB — BASIC METABOLIC PANEL
Anion gap: 9 (ref 5–15)
BUN: 27 mg/dL — ABNORMAL HIGH (ref 8–23)
CO2: 29 mmol/L (ref 22–32)
Calcium: 10.2 mg/dL (ref 8.9–10.3)
Chloride: 102 mmol/L (ref 98–111)
Creatinine, Ser: 1.67 mg/dL — ABNORMAL HIGH (ref 0.61–1.24)
GFR, Estimated: 42 mL/min — ABNORMAL LOW (ref >60)
Glucose, Bld: 93 mg/dL (ref 70–99)
Potassium: 4.5 mmol/L (ref 3.5–5.1)
Sodium: 140 mmol/L (ref 135–145)

## 2020-07-15 LAB — SARS CORONAVIRUS 2 BY RT PCR (HOSPITAL ORDER, PERFORMED IN ~~LOC~~ HOSPITAL LAB): SARS Coronavirus 2: POSITIVE — AB

## 2020-07-15 LAB — TROPONIN I (HIGH SENSITIVITY): Troponin I (High Sensitivity): 5 ng/L (ref <18)

## 2020-07-15 MED ORDER — ALBUTEROL SULFATE HFA 108 (90 BASE) MCG/ACT IN AERS: 2.0000 | INHALATION_SPRAY | RESPIRATORY_TRACT | Status: DC | PRN

## 2020-07-15 NOTE — ED Provider Notes (Signed)
[facility] Emergency Department Provider Note MRN:  [id number]  Arrival date & time: 07/15/20     Chief Complaint   Shortness of Breath   History of Present Illness   [name] is a 75 y.o. year-old male with a history of CAD, CHF presenting to the ED with chief complaint of shortness of.  For the past 4 days patient has been experiencing cough, nasal congestion, sore throat.  Wife has recently tested positive for COVID-19.  For the past 1 to 2 days he has been experiencing some mild shortness of breath and some left-sided chest discomfort, described as a sharp pain, mostly with coughing.  Denies abdominal pain, no nausea vomiting or diarrhea, no other complaints.  Symptoms are mild, constant.  Review of Systems  A complete 10 system review of systems was obtained and all systems are negative except as noted in the HPI and PMH.   Patient's Health History    Past Medical History:  Diagnosis Date  . Arthritis    "left shoulder" (02/09/2015)  . Blind left eye   . CAD (coronary artery disease)   . Closed fracture of lateral portion of right tibial plateau 11/01/2017  . Complication of anesthesia    "they give me too much anesthesia & had to put me on life support for a little bit w/gallbladder OR"  . Coronary artery disease   . Former smoker   . GERD (gastroesophageal reflux disease)   . Gout   . History of bleeding ulcers   . History of gout   . History of peptic ulcer disease   . Hyperlipidemia   . Hypertension   . Hypertensive heart disease without CHF   . Kidney stones   . Lumbar disc disease   . Myocardial infarction [facility]) 1995; 2007  . Obesity   . OSA (obstructive sleep apnea)   . Pneumonia X 2  . Prostate cancer ([place])   . Pulmonary nodule   . Second degree Mobitz I AV block     Past Surgical History:  Procedure Laterality Date  . APPENDECTOMY    . APPENDECTOMY  ~ 1985  . BACK SURGERY    . CARDIAC CATHETERIZATION N/A 02/08/2015    Procedure: Left Heart Cath and Coronary Angiography;  Surgeon: Leonie Man, MD;  Location: [place] [facility];  Service: Cardiovascular;  Laterality: N/A;  . CARDIAC CATHETERIZATION N/A 02/08/2015   Procedure: Coronary Stent Intervention;  Surgeon: Leonie Man, MD;  Location: [place] [facility];  Service: Cardiovascular;  Laterality: N/A;  . CHOLECYSTECTOMY    . CORONARY ANGIOPLASTY WITH STENT PLACEMENT  ~ 1995; 2007   "1 stent; 2 stents"  . LAPAROSCOPIC CHOLECYSTECTOMY    . LEFT HEART CATH AND CORONARY ANGIOGRAPHY N/A 07/13/2018   Procedure: LEFT HEART CATH AND CORONARY ANGIOGRAPHY;  Surgeon: Martinique, Peter M, MD;  Location: [place] [facility];  Service: Cardiovascular;  Laterality: N/A;  . LITHOTRIPSY  X 1  . LUMBAR LAMINECTOMY    . ORIF TIBIA PLATEAU Right 11/05/2017   Procedure: OPEN REDUCTION INTERNAL FIXATION (ORIF) TIBIAL PLATEAU;  Surgeon: Shona Needles, MD;  Location: [facility];  Service: Orthopedics;  Laterality: Right;  . PACEMAKER LEADLESS INSERTION N/A 05/17/2019   Procedure: PACEMAKER LEADLESS INSERTION;  Surgeon: Thompson Grayer, MD;  Location: [place] [facility];  Service: Cardiovascular;  Laterality: N/A;  . POSTERIOR LUMBAR FUSION  2003?  . PROSTATE BIOPSY  2015    Family History  Problem Relation Age  of Onset  . Hypertension Mother   . Coronary artery disease Mother   . Aneurysm Father   . Hypertension Father   . COPD Sister   . Sleep apnea Other        3 siblings    Social History   Socioeconomic History  . Marital status: Married    Spouse name: Not on file  . Number of children: 2  . Years of education: Not on file  . Highest education level: Not on file  Occupational History  . Occupation: retired  Tobacco Use  . Smoking status: Former Smoker    Packs/day: 3.00    Years: 35.00    Pack years: 105.00    Types: Cigarettes    Quit date: 06/17/1983    Years since quitting: 37.1  . Smokeless tobacco: Never Used  . Tobacco comment: "quit smoking cigarettes  in 1985  Substance and Sexual Activity  . Alcohol use: Yes    Alcohol/week: 0.0 standard drinks    Comment: "I drank a whole lot when I was younger; nothing since 1990"  . Drug use: No  . Sexual activity: Never  Other Topics Concern  . Not on file  Social History Narrative   ** Merged History Encounter **       Social Determinants of Health   Financial Resource Strain: Not on file  Food Insecurity: Not on file  Transportation Needs: Not on file  Physical Activity: Not on file  Stress: Not on file  Social Connections: Not on file  Intimate Partner Violence: Not on file     Physical Exam   Vitals:   07/15/20 1916 07/15/20 2309  BP: 136/70 136/66  Pulse: 69 68  Resp: 19 20  Temp: 98.4 F (36.9 C) 98 F (36.7 C)  SpO2: 97% 94%    CONSTITUTIONAL: Chronically ill-appearing, NAD NEURO:  Alert and oriented x 3, no focal deficits EYES:  eyes equal and reactive ENT/NECK:  no LAD, no JVD CARDIO: Regular rate, well-perfused, normal S1 and S2 PULM:  CTAB no wheezing or rhonchi GI/GU:  normal bowel sounds, non-distended, non-tender MSK/SPINE:  No gross deformities, no edema SKIN:  no rash, atraumatic PSYCH:  Appropriate speech and behavior  *Additional and/or pertinent findings included in MDM below  Diagnostic and Interventional Summary    EKG Interpretation  Date/Time:  Sunday July 15 2020 20:10:34 EST Ventricular Rate:  67 PR Interval:  248 QRS Duration: 150 QT Interval:  436 QTC Calculation: 460 R Axis:   -88 Text Interpretation: Atrial-sensed ventricular-paced rhythm with prolonged AV conduction Abnormal ECG No significant change was found Confirmed by Gerlene Fee [phone]) on 07/15/2020 11:00:54 PM      Labs Reviewed  SARS CORONAVIRUS 2 BY RT PCR (HOSPITAL ORDER, [place] [facility]) - Abnormal; Notable for the following components:      Result Value   SARS Coronavirus 2 POSITIVE (*)    All other components within normal limits  BASIC  METABOLIC PANEL - Abnormal; Notable for the following components:   BUN 27 (*)    Creatinine, Ser 1.67 (*)    GFR, Estimated 42 (*)    All other components within normal limits  CBC  TROPONIN I (HIGH SENSITIVITY)  TROPONIN I (HIGH SENSITIVITY)    DG Chest Portable 1 View  Final Result      Medications  albuterol (VENTOLIN HFA) 108 (90 Base) MCG/ACT inhaler 2 puff (has no administration in time range)     Procedures  /  Critical Care Procedures  ED Course and Medical Decision Making  I have reviewed the triage vital signs, the nursing notes, and pertinent available records from the EMR.  Listed above are laboratory and imaging tests that I personally ordered, reviewed, and interpreted and then considered in my medical decision making (see below for details).  Symptoms seem well explained by COVID-19.  Given patient's cardiac history also considering ACS however EKG is reassuring with no ischemic features.  Troponin is negative.  Has some mild lower extremity edema which is chronic, no asymmetry, no increased work of breathing, no hypoxia, no tachycardia, doubt VTE.  Patient is appropriate for reassurance and discharge with isolation instructions.  Will refer for possible outpatient monoclonal antibody therapy.  [name] was evaluated in Emergency Department on 07/15/2020 for the symptoms described in the history of present illness. He was evaluated in the context of the global COVID-19 pandemic, which necessitated consideration that the patient might be at risk for infection with the SARS-CoV-2 virus that causes COVID-19. Institutional protocols and algorithms that pertain to the evaluation of patients at risk for COVID-19 are in a state of rapid change based on information released by regulatory bodies including the CDC and federal and state organizations. These policies and algorithms were followed during the patient's care in the ED.   Barth Kirks. [place]onia Small, [place] [email or web address]  Final Clinical Impressions(s) / ED Diagnoses     ICD-10-CM   1. Cough  R05.9   2. Sore throat  J02.9   3. SOB (shortness of breath)  R06.02   4. Chest pain, unspecified type  R07.9   5. COVID-19  U07.1     ED Discharge Orders    None       Discharge Instructions Discussed with and Provided to Patient:     Discharge Instructions     You were evaluated in the Emergency Department and after careful evaluation, we did not find any emergent condition requiring admission or further testing in the hospital.  Your exam/testing today was overall reassuring.  Your symptoms seem to be due to COVID-19.  You have tested positive here in the emergency department.  Please continue home isolation as we discussed.  Use Tylenol for discomfort.  Please return to the Emergency Department if you experience any worsening of your condition.  Thank you for allowing [place] to be a part of your care.       Maudie Flakes, MD 07/15/20 [phone]

## 2020-07-15 NOTE — ED Triage Notes (Addendum)
Pt presents to ED POV. Pt c/o SOB, CP, sore throat. Pt reports that his wife tested +covid on wed. Pt reports that s/s began yesterday. CP is on L side and is only upon movement. Pt states that he is vaccinated. resp e/u

## 2020-07-15 NOTE — Discharge Instructions (Addendum)
You were evaluated in the Emergency Department and after careful evaluation, we did not find any emergent condition requiring admission or further testing in the hospital.  Your exam/testing today was overall reassuring.  Your symptoms seem to be due to COVID-19.  You have tested positive here in the emergency department.  Please continue home isolation as we discussed.  Use Tylenol for discomfort.  Please return to the Emergency Department if you experience any worsening of your condition.  Thank you for allowing [place] to be a part of your care.

## 2020-07-15 NOTE — ED Notes (Signed)
Patient moved to appropriate waiting area

## 2020-07-16 ENCOUNTER — Other Ambulatory Visit: Payer: Self-pay | Admitting: Physician Assistant

## 2020-07-16 ENCOUNTER — Ambulatory Visit: Payer: Medicare HMO | Admitting: Internal Medicine

## 2020-07-16 ENCOUNTER — Telehealth: Payer: Self-pay

## 2020-07-16 DIAGNOSIS — I10 Essential (primary) hypertension: Secondary | ICD-10-CM

## 2020-07-16 DIAGNOSIS — I2510 Atherosclerotic heart disease of native coronary artery without angina pectoris: Secondary | ICD-10-CM

## 2020-07-16 DIAGNOSIS — U071 COVID-19: Secondary | ICD-10-CM

## 2020-07-16 DIAGNOSIS — E669 Obesity, unspecified: Secondary | ICD-10-CM

## 2020-07-16 LAB — TROPONIN I (HIGH SENSITIVITY): Troponin I (High Sensitivity): 6 ng/L (ref <18)

## 2020-07-16 NOTE — Telephone Encounter (Signed)
Called to discuss with patient about COVID-19 symptoms and the use of one of the available treatments for those with mild to moderate Covid symptoms and at a high risk of hospitalization.  Pt appears to qualify for outpatient treatment due to co-morbid conditions and/or a member of an at-risk group in accordance with the FDA Emergency Use Authorization.    Symptom onset: 07/11/20 Vaccinated: Yes Booster? No Immunocompromised? No Qualifiers: CHF, CAD  Discussed with patient via phone. I have asked them to expect a call from APP to discuss treatment options. Moved to pre-screened list.    [name]

## 2020-07-16 NOTE — Progress Notes (Signed)
I connected by phone with [name] on 07/16/2020 at 1:27 PM to discuss the potential use of a new treatment for mild to moderate COVID-19 viral infection in non-hospitalized patients.  This patient is a 75 y.o. male that meets the FDA criteria for Emergency Use Authorization of COVID monoclonal antibody sotrovimab.  Has a (+) direct SARS-CoV-2 viral test result  Has mild or moderate COVID-19   Is NOT hospitalized due to COVID-19  Is within 10 days of symptom onset  Has at least one of the high risk factor(s) for progression to severe COVID-19 and/or hospitalization as defined in EUA.  Specific high risk criteria : Older age (>/= 76 yo), BMI > 25, Cardiovascular disease or hypertension and Other high risk medical condition per CDC:  high SVI   I have spoken and communicated the following to the patient or parent/caregiver regarding COVID monoclonal antibody treatment:  1. FDA has authorized the emergency use for the treatment of mild to moderate COVID-19 in adults and pediatric patients with positive results of direct SARS-CoV-2 viral testing who are 50 years of age and older weighing at least 40 kg, and who are at high risk for progressing to severe COVID-19 and/or hospitalization.  2. The significant known and potential risks and benefits of COVID monoclonal antibody, and the extent to which such potential risks and benefits are unknown.  3. Information on available alternative treatments and the risks and benefits of those alternatives, including clinical trials.  4. Patients treated with COVID monoclonal antibody should continue to self-isolate and use infection control measures (e.g., wear mask, isolate, social distance, avoid sharing personal items, clean and disinfect "high touch" surfaces, and frequent handwashing) according to CDC guidelines.   5. The patient or parent/caregiver has the option to accept or refuse COVID monoclonal antibody treatment.  After reviewing this  information with the patient, the patient has agreed to receive one of the available covid 19 monoclonal antibodies and will be provided an appropriate fact sheet prior to infusion.  Sx onset 1/26. Set up for infusion on 2/1 @ 10:30am. D[facility] given to [facility]. Pt is aware that insurance will be charged an infusion fee. Pt is vaccinated.   Angelena Form 07/16/2020 1:27 PM

## 2020-07-17 ENCOUNTER — Ambulatory Visit (HOSPITAL_COMMUNITY)
Admission: RE | Admit: 2020-07-17 | Discharge: 2020-07-17 | Disposition: A | Discharge status: Home / Self Care | Payer: Medicare HMO | Source: Ambulatory Visit | Attending: Pulmonary Disease | Admitting: Pulmonary Disease

## 2020-07-17 DIAGNOSIS — R54 Age-related physical debility: Secondary | ICD-10-CM | POA: Insufficient documentation

## 2020-07-17 DIAGNOSIS — I10 Essential (primary) hypertension: Secondary | ICD-10-CM | POA: Insufficient documentation

## 2020-07-17 DIAGNOSIS — I2510 Atherosclerotic heart disease of native coronary artery without angina pectoris: Secondary | ICD-10-CM | POA: Insufficient documentation

## 2020-07-17 DIAGNOSIS — E669 Obesity, unspecified: Secondary | ICD-10-CM | POA: Diagnosis not present

## 2020-07-17 DIAGNOSIS — U071 COVID-19: Secondary | ICD-10-CM | POA: Diagnosis not present

## 2020-07-17 MED ORDER — SOTROVIMAB 500 MG/8ML IV SOLN
500.0000 mg | Freq: Once | INTRAVENOUS | Status: AC
  Administered 2020-07-17: 500 mg via INTRAVENOUS

## 2020-07-17 MED ORDER — DIPHENHYDRAMINE HCL 50 MG/ML IJ SOLN: 50.0000 mg | Freq: Once | INTRAMUSCULAR | Status: DC | PRN

## 2020-07-17 MED ORDER — FAMOTIDINE IN NACL 20-0.9 MG/50ML-% IV SOLN: 20.0000 mg | Freq: Once | INTRAVENOUS | Status: DC | PRN

## 2020-07-17 MED ORDER — ALBUTEROL SULFATE HFA 108 (90 BASE) MCG/ACT IN AERS: 2.0000 | INHALATION_SPRAY | Freq: Once | RESPIRATORY_TRACT | Status: DC | PRN

## 2020-07-17 MED ORDER — SODIUM CHLORIDE 0.9 % IV SOLN: INTRAVENOUS | Status: DC | PRN

## 2020-07-17 MED ORDER — EPINEPHRINE 0.3 MG/0.3ML IJ SOAJ: 0.3000 mg | Freq: Once | INTRAMUSCULAR | Status: DC | PRN

## 2020-07-17 MED ORDER — METHYLPREDNISOLONE SODIUM SUCC 125 MG IJ SOLR: 125.0000 mg | Freq: Once | INTRAMUSCULAR | Status: DC | PRN

## 2020-07-17 NOTE — Progress Notes (Signed)
Diagnosis: COVID-19  Physician: Dr. Patrick Wright  Procedure: Covid Infusion Clinic Med: Sotrovimab infusion - Provided patient with sotrovimab fact sheet for patients, parents, and caregivers prior to infusion.   Complications: No immediate complications noted  Discharge: Discharged home    

## 2020-07-17 NOTE — Discharge Instructions (Signed)

## 2020-07-17 NOTE — Progress Notes (Signed)
Patient reviewed Fact Sheet for Patients, Parents, and Caregivers for Emergency Use Authorization (EUA) of sotrovimab for the Treatment of Coronavirus. Patient also reviewed and is agreeable to the estimated cost of treatment. Patient is agreeable to proceed.   

## 2020-07-25 ENCOUNTER — Ambulatory Visit: Payer: Medicare HMO | Admitting: Internal Medicine

## 2020-07-25 ENCOUNTER — Encounter: Payer: Self-pay | Admitting: Internal Medicine

## 2020-07-25 ENCOUNTER — Other Ambulatory Visit: Payer: Self-pay

## 2020-07-25 VITALS — BP 112/56 | HR 68 | Ht 74.0 in | Wt 298.0 lb

## 2020-07-25 DIAGNOSIS — I2510 Atherosclerotic heart disease of native coronary artery without angina pectoris: Secondary | ICD-10-CM

## 2020-07-25 DIAGNOSIS — Z950 Presence of cardiac pacemaker: Secondary | ICD-10-CM | POA: Diagnosis not present

## 2020-07-25 DIAGNOSIS — I5042 Chronic combined systolic (congestive) and diastolic (congestive) heart failure: Secondary | ICD-10-CM | POA: Diagnosis not present

## 2020-07-25 DIAGNOSIS — I10 Essential (primary) hypertension: Secondary | ICD-10-CM | POA: Diagnosis not present

## 2020-07-25 DIAGNOSIS — I441 Atrioventricular block, second degree: Secondary | ICD-10-CM

## 2020-07-25 NOTE — Progress Notes (Signed)
OFFICE NOTE  Chief Complaint:  No compaints  Primary Care Physician: Maury Dus, MD  HPI:  [name] is a 75 y.o. male with a past medial history significant for coronary disease and in January underwent left heart cath which showed an occluded distal circumflex.  Medical therapy was recommended.  This was complicated by some Wenkebach and he was evaluated by EP but not felt to be candidate for pacing.  He then developed progressive fatigue and worsening symptoms and was ultimately referred for pacemaker.  He underwent leadless pacemaker placement without complication and does report improvement in his symptoms.  He still remains significantly overweight and less active for which he will need to continue to work on.  He has an in office pacer check in about a week.  He also has sleep apnea but has been intolerant of BiPAP therapy and has follow-up in a week with his pulmonologist.  07/25/2020  [name] returns today for follow-up.  He is overall doing well since he had his pacemaker placed.  He is compliant with BiPAP.  He recently was recently hospitalized in January with acute on chronic CHF - LVEF is now lower at 45-50%. He is now on lasix, aldactone and Entresto. Subsequently, he contracted COVID-19.  He was treated with monoclonal antibody therapy on February 1.  He says he is asymptomatic.  He wants to try to return to work next Monday.  Blood pressure appears well controlled today.  Remote pacer checks are stable showing normal device function and no mode switches.  He reports that he is now feeling much better. He wants to return to work next Monday.  PMHx:  Past Medical History:  Diagnosis Date  . Arthritis    "left shoulder" (02/09/2015)  . Blind left eye   . CAD (coronary artery disease)   . Closed fracture of lateral portion of right tibial plateau 11/01/2017  . Complication of anesthesia    "they give me too much anesthesia & had to put me on life support for a little  bit w/gallbladder OR"  . Coronary artery disease   . Former smoker   . GERD (gastroesophageal reflux disease)   . Gout   . History of bleeding ulcers   . History of gout   . History of peptic ulcer disease   . Hyperlipidemia   . Hypertension   . Hypertensive heart disease without CHF   . Kidney stones   . Lumbar disc disease   . Myocardial infarction [facility]) 1995; 2007  . Obesity   . OSA (obstructive sleep apnea)   . Pneumonia X 2  . Prostate cancer ([place])   . Pulmonary nodule   . Second degree Mobitz I AV block     Past Surgical History:  Procedure Laterality Date  . APPENDECTOMY    . APPENDECTOMY  ~ 1985  . BACK SURGERY    . CARDIAC CATHETERIZATION N/A 02/08/2015   Procedure: Left Heart Cath and Coronary Angiography;  Surgeon: Leonie Man, MD;  Location: [place] [facility];  Service: Cardiovascular;  Laterality: N/A;  . CARDIAC CATHETERIZATION N/A 02/08/2015   Procedure: Coronary Stent Intervention;  Surgeon: Leonie Man, MD;  Location: [place] [facility];  Service: Cardiovascular;  Laterality: N/A;  . CHOLECYSTECTOMY    . CORONARY ANGIOPLASTY WITH STENT PLACEMENT  ~ 1995; 2007   "1 stent; 2 stents"  . LAPAROSCOPIC CHOLECYSTECTOMY    . LEFT HEART CATH AND CORONARY ANGIOGRAPHY N/A 07/13/2018   Procedure:  LEFT HEART CATH AND CORONARY ANGIOGRAPHY;  Surgeon: Martinique, Peter M, MD;  Location: [place] [facility];  Service: Cardiovascular;  Laterality: N/A;  . LITHOTRIPSY  X 1  . LUMBAR LAMINECTOMY    . ORIF TIBIA PLATEAU Right 11/05/2017   Procedure: OPEN REDUCTION INTERNAL FIXATION (ORIF) TIBIAL PLATEAU;  Surgeon: Shona Needles, MD;  Location: [place];  Service: Orthopedics;  Laterality: Right;  . PACEMAKER LEADLESS INSERTION N/A 05/17/2019   Procedure: PACEMAKER LEADLESS INSERTION;  Surgeon: Thompson Grayer, MD;  Location: [place] [facility];  Service: Cardiovascular;  Laterality: N/A;  . POSTERIOR LUMBAR FUSION  2003?  . PROSTATE BIOPSY  2015    FAMHx:  Family History   Problem Relation Age of Onset  . Hypertension Mother   . Coronary artery disease Mother   . Aneurysm Father   . Hypertension Father   . COPD Sister   . Sleep apnea Other        3 siblings    SOCHx:   reports that he quit smoking about 37 years ago. His smoking use included cigarettes. He has a 105.00 pack-year smoking history. He has never used smokeless tobacco. He reports current alcohol use. He reports that he does not use drugs.  ALLERGIES:  Allergies  Allergen Reactions  . Prednisone Swelling    ROS: Pertinent items noted in HPI and remainder of comprehensive ROS otherwise negative.  HOME MEDS: Current Outpatient Medications on File Prior to Visit  Medication Sig Dispense Refill  . acetaminophen (TYLENOL) 500 MG tablet Take 500 mg by mouth every 6 (six) hours as needed for moderate pain.    Marland Kitchen albuterol (VENTOLIN HFA) 108 (90 Base) MCG/ACT inhaler Inhale 1-2 puffs into the lungs every 6 (six) hours as needed for wheezing or shortness of breath.    . allopurinol (ZYLOPRIM) 300 MG tablet Take 300 mg by mouth Daily.     Marland Kitchen amLODipine (NORVASC) 10 MG tablet Take 10 mg by mouth Daily.     Marland Kitchen Apple Cider Vinegar 300 MG TABS Take 2 tablets by mouth daily.    Marland Kitchen aspirin EC 81 MG EC tablet Take 1 tablet (81 mg total) by mouth daily.    Marland Kitchen atorvastatin (LIPITOR) 40 MG tablet Take 1 tablet (40 mg total) by mouth once a week. 4 tablet 3  . diphenhydrAMINE (BENADRYL) 25 MG tablet Take 50 mg by mouth 2 (two) times daily as needed for allergies.    . furosemide (LASIX) 40 MG tablet Take 1 tablet (40 mg total) by mouth 2 (two) times daily. 60 tablet 3  . ondansetron (ZOFRAN) 8 MG tablet Take 4-8 mg by mouth every 8 (eight) hours as needed for nausea or vomiting.    . pantoprazole (PROTONIX) 40 MG tablet Take 40 mg by mouth daily.    . potassium chloride SA (KLOR-CON) 20 MEQ tablet Take 1 tablet (20 mEq total) by mouth 2 (two) times daily. 60 tablet 6  . sacubitril-valsartan (ENTRESTO) 24-26 MG  Take 1 tablet by mouth 2 (two) times daily. 60 tablet 6  . spironolactone (ALDACTONE) 25 MG tablet Take 1 tablet (25 mg total) by mouth daily. 90 tablet 3   No current facility-administered medications on file prior to visit.    LABS/IMAGING: No results found for this or any previous visit (from the past 48 hour(s)). No results found.  LIPID PANEL:    Component Value Date/Time   CHOL 154 06/27/2020 0500   TRIG 204 (H) 06/27/2020 0500   HDL 30 (  L) 06/27/2020 0500   CHOLHDL 5.1 06/27/2020 0500   VLDL 41 (H) 06/27/2020 0500   LDLCALC 83 06/27/2020 0500     WEIGHTS: Wt Readings from Last 3 Encounters:  07/25/20 298 lb (135.2 kg)  07/15/20 290 lb (131.5 kg)  07/04/20 294 lb 8.6 oz (133.6 kg)    VITALS: BP (!) 112/56 (BP Location: Right Arm, Patient Position: Sitting, Cuff Size: Large)   Pulse 68   Ht 6\' 2"  (1.88 m)   Wt 298 lb (135.2 kg)   BMI 38.26 kg/m   EXAM: General appearance: alert, no distress and morbidly obese Neck: no carotid bruit, no JVD and thyroid not enlarged, symmetric, no tenderness/mass/nodules Lungs: clear to auscultation bilaterally Heart: regular rate and rhythm Abdomen: soft, non-tender; bowel sounds normal; no masses,  no organomegaly Extremities: extremities normal, atraumatic, no cyanosis or edema Pulses: 2+ and symmetric Skin: Skin color, texture, turgor normal. No rashes or lesions Neurologic: Grossly normal Psych: Pleasant  EKG: Deferred  ASSESSMENT: 1. Coronary artery disease-occluded distal circumflex and patent stents in the LAD, RCA and circumflex (06/2018), LVEF 45-50% (06/2020) 2. Acute systolic heart failure, NYHA Class II symptoms (ICM) - LVEF 45-50% 3. Hypertension 4. Dyslipidemia 5. Asthma with chronic shortness of breath 6. Second-degree Type 1 AVB (Wenkebach) -status post leadless pacemaker  7. OSA on BIPAP  PLAN: 1.   Mr. Gambrell says he is feeling better since his pacemaker.  He recently had COVID-19 and was treated with  monoclonal antibody therapy.  Echo performed prior to that in January for CHF exacerbation showed a decrease in EF of 45 to 50%.  He does have a occluded distal circumflex based on cath in 2020.  Blood pressures well controlled.  He is now on GDMT with Entresto, aldactone and lasix,  but the dose of Entresto cannot be uptitrated given his low diastolic blood pressure.  I would not recommend changes to his medicines today.  Continue compliance with BiPAP and efforts at weight loss.  Follow-up annually or sooner as necessary  Pixie Casino, MD, FACC, [place] Director of the Advanced Lipid Disorders &  Cardiovascular Risk Reduction Clinic Diplomate of the American Board of Clinical Lipidology Attending Cardiologist  Direct Dial: [phone]  Fax: [phone]  Website:  [email or web address] 07/25/2020, 10:41 AM

## 2020-07-25 NOTE — Patient Instructions (Signed)
Medication Instructions:  No changes   *If you need a refill on your cardiac medications before your next appointment, please call your pharmacy*   Lab Work: Not needed    Testing/Procedures: Not needed   Follow-Up: At [facility], you and your health needs are our priority.  As part of our continuing mission to provide you with exceptional heart care, we have created designated Provider Care Teams.  These Care Teams include your primary Cardiologist (physician) and Advanced Practice Providers (APPs -  Physician Assistants and Nurse Practitioners) who all work together to provide you with the care you need, when you need it.     Your next appointment:   12 month(s)  The format for your next appointment:   In Person  Provider:   K. Mali Hilty, MD   Other Instructions May return to work on Monday  Jul 30, 2020

## 2020-07-30 DIAGNOSIS — G4733 Obstructive sleep apnea (adult) (pediatric): Secondary | ICD-10-CM | POA: Diagnosis not present

## 2020-08-03 ENCOUNTER — Other Ambulatory Visit: Payer: Self-pay | Admitting: Medical

## 2020-08-15 ENCOUNTER — Ambulatory Visit (INDEPENDENT_AMBULATORY_CARE_PROVIDER_SITE_OTHER): Payer: Medicare HMO

## 2020-08-15 DIAGNOSIS — I441 Atrioventricular block, second degree: Secondary | ICD-10-CM

## 2020-08-16 LAB — CUP PACEART REMOTE DEVICE CHECK
Battery Remaining Longevity: 96 mo
Battery Voltage: 3.02 V
Brady Statistic AS VP Percent: 74.09 %
Brady Statistic AS VS Percent: 0.08 %
Brady Statistic RV Percent Paced: 97.21 %
Date Time Interrogation Session: 20220302095100
Implantable Pulse Generator Implant Date: 20201201
Lead Channel Impedance Value: 570 Ohm
Lead Channel Pacing Threshold Amplitude: 0.375 V
Lead Channel Pacing Threshold Pulse Width: 0.24 ms
Lead Channel Sensing Intrinsic Amplitude: 28.575 mV
Lead Channel Setting Pacing Amplitude: 0.875
Lead Channel Setting Pacing Pulse Width: 0.24 ms
Lead Channel Setting Sensing Sensitivity: 2 mV

## 2020-08-23 NOTE — Progress Notes (Signed)
Remote pacemaker transmission.   

## 2020-08-27 DIAGNOSIS — G4733 Obstructive sleep apnea (adult) (pediatric): Secondary | ICD-10-CM | POA: Diagnosis not present

## 2020-08-30 DIAGNOSIS — G4733 Obstructive sleep apnea (adult) (pediatric): Secondary | ICD-10-CM | POA: Diagnosis not present

## 2020-09-20 DIAGNOSIS — N1831 Chronic kidney disease, stage 3a: Secondary | ICD-10-CM | POA: Diagnosis not present

## 2020-09-20 DIAGNOSIS — R7309 Other abnormal glucose: Secondary | ICD-10-CM | POA: Diagnosis not present

## 2020-09-20 DIAGNOSIS — H543 Unqualified visual loss, both eyes: Secondary | ICD-10-CM | POA: Diagnosis not present

## 2020-09-20 DIAGNOSIS — I2510 Atherosclerotic heart disease of native coronary artery without angina pectoris: Secondary | ICD-10-CM | POA: Diagnosis not present

## 2020-09-20 DIAGNOSIS — I441 Atrioventricular block, second degree: Secondary | ICD-10-CM | POA: Diagnosis not present

## 2020-09-20 DIAGNOSIS — C61 Malignant neoplasm of prostate: Secondary | ICD-10-CM | POA: Diagnosis not present

## 2020-09-20 DIAGNOSIS — M109 Gout, unspecified: Secondary | ICD-10-CM | POA: Diagnosis not present

## 2020-09-20 DIAGNOSIS — E7800 Pure hypercholesterolemia, unspecified: Secondary | ICD-10-CM | POA: Diagnosis not present

## 2020-09-20 DIAGNOSIS — Z0000 Encounter for general adult medical examination without abnormal findings: Secondary | ICD-10-CM | POA: Diagnosis not present

## 2020-09-20 DIAGNOSIS — I129 Hypertensive chronic kidney disease with stage 1 through stage 4 chronic kidney disease, or unspecified chronic kidney disease: Secondary | ICD-10-CM | POA: Diagnosis not present

## 2020-09-20 DIAGNOSIS — Z1389 Encounter for screening for other disorder: Secondary | ICD-10-CM | POA: Diagnosis not present

## 2020-09-20 DIAGNOSIS — K219 Gastro-esophageal reflux disease without esophagitis: Secondary | ICD-10-CM | POA: Diagnosis not present

## 2020-09-22 DIAGNOSIS — I110 Hypertensive heart disease with heart failure: Secondary | ICD-10-CM | POA: Diagnosis not present

## 2020-09-22 DIAGNOSIS — I25119 Atherosclerotic heart disease of native coronary artery with unspecified angina pectoris: Secondary | ICD-10-CM | POA: Diagnosis not present

## 2020-09-22 DIAGNOSIS — J449 Chronic obstructive pulmonary disease, unspecified: Secondary | ICD-10-CM | POA: Diagnosis not present

## 2020-09-22 DIAGNOSIS — Z6841 Body Mass Index (BMI) 40.0 and over, adult: Secondary | ICD-10-CM | POA: Diagnosis not present

## 2020-09-22 DIAGNOSIS — G4733 Obstructive sleep apnea (adult) (pediatric): Secondary | ICD-10-CM | POA: Diagnosis not present

## 2020-09-22 DIAGNOSIS — E785 Hyperlipidemia, unspecified: Secondary | ICD-10-CM | POA: Diagnosis not present

## 2020-09-22 DIAGNOSIS — I509 Heart failure, unspecified: Secondary | ICD-10-CM | POA: Diagnosis not present

## 2020-09-22 DIAGNOSIS — Z008 Encounter for other general examination: Secondary | ICD-10-CM | POA: Diagnosis not present

## 2020-09-22 DIAGNOSIS — E261 Secondary hyperaldosteronism: Secondary | ICD-10-CM | POA: Diagnosis not present

## 2020-09-22 DIAGNOSIS — C61 Malignant neoplasm of prostate: Secondary | ICD-10-CM | POA: Diagnosis not present

## 2020-09-27 DIAGNOSIS — G4733 Obstructive sleep apnea (adult) (pediatric): Secondary | ICD-10-CM | POA: Diagnosis not present

## 2020-10-16 DIAGNOSIS — N1831 Chronic kidney disease, stage 3a: Secondary | ICD-10-CM | POA: Diagnosis not present

## 2020-10-27 DIAGNOSIS — G4733 Obstructive sleep apnea (adult) (pediatric): Secondary | ICD-10-CM | POA: Diagnosis not present

## 2020-11-04 ENCOUNTER — Emergency Department (HOSPITAL_COMMUNITY)
Admission: EM | Admit: 2020-11-04 | Discharge: 2020-11-05 | Disposition: A | Discharge status: Home / Self Care | Payer: Medicare HMO | Attending: Emergency Medicine | Admitting: Emergency Medicine

## 2020-11-04 ENCOUNTER — Encounter (HOSPITAL_COMMUNITY): Payer: Self-pay

## 2020-11-04 ENCOUNTER — Emergency Department (HOSPITAL_COMMUNITY): Payer: Medicare HMO

## 2020-11-04 ENCOUNTER — Other Ambulatory Visit: Payer: Self-pay

## 2020-11-04 DIAGNOSIS — Z79899 Other long term (current) drug therapy: Secondary | ICD-10-CM | POA: Insufficient documentation

## 2020-11-04 DIAGNOSIS — N189 Chronic kidney disease, unspecified: Secondary | ICD-10-CM | POA: Diagnosis not present

## 2020-11-04 DIAGNOSIS — Z950 Presence of cardiac pacemaker: Secondary | ICD-10-CM | POA: Insufficient documentation

## 2020-11-04 DIAGNOSIS — M25561 Pain in right knee: Secondary | ICD-10-CM | POA: Diagnosis not present

## 2020-11-04 DIAGNOSIS — Z8546 Personal history of malignant neoplasm of prostate: Secondary | ICD-10-CM | POA: Insufficient documentation

## 2020-11-04 DIAGNOSIS — I5043 Acute on chronic combined systolic (congestive) and diastolic (congestive) heart failure: Secondary | ICD-10-CM | POA: Insufficient documentation

## 2020-11-04 DIAGNOSIS — Z7982 Long term (current) use of aspirin: Secondary | ICD-10-CM | POA: Insufficient documentation

## 2020-11-04 DIAGNOSIS — I2510 Atherosclerotic heart disease of native coronary artery without angina pectoris: Secondary | ICD-10-CM | POA: Diagnosis not present

## 2020-11-04 DIAGNOSIS — I130 Hypertensive heart and chronic kidney disease with heart failure and stage 1 through stage 4 chronic kidney disease, or unspecified chronic kidney disease: Secondary | ICD-10-CM | POA: Diagnosis not present

## 2020-11-04 DIAGNOSIS — Z87891 Personal history of nicotine dependence: Secondary | ICD-10-CM | POA: Diagnosis not present

## 2020-11-04 DIAGNOSIS — J45901 Unspecified asthma with (acute) exacerbation: Secondary | ICD-10-CM | POA: Diagnosis not present

## 2020-11-04 DIAGNOSIS — M25461 Effusion, right knee: Secondary | ICD-10-CM

## 2020-11-04 NOTE — ED Triage Notes (Signed)
Right leg (knee)  pain x 2-3 weeks. Felt popping sensation earlier this morning. Hx of surgery to same knee.

## 2020-11-04 NOTE — ED Provider Notes (Signed)
Emergency Medicine Provider Triage Evaluation Note  [name] , a 75 y.o. male  was evaluated in triage.  Pt complains of right knee pain. Patient states pain has been present for the past 2-3 weeks; however he heard a "pop" earlier today as he was walking down a ramp. He notes his right leg gave out. He did not fall to the ground. Previous injury to right knee requiring surgery. No associated edema, erythema, or warmth. No fever or chills.  Review of Systems  Positive: Knee pain Negative: fever  Physical Exam  BP 114/65 (BP Location: Right Arm)   Pulse 68   Temp 98.6 F (37 C) (Oral)   Resp 18   Ht 6\' 2"  (1.88 m)   Wt 135.2 kg   SpO2 95%   BMI 38.27 kg/m  Gen:   Awake, no distress   Resp:  Normal effort  MSK:   Moves extremities without difficulty  Other:  TTP throughout anterior portion of right knee  Medical Decision Making  Medically screening exam initiated at 8:52 PM.  Appropriate orders placed.  [name] was informed that the remainder of the evaluation will be completed by another provider, this initial triage assessment does not replace that evaluation, and the importance of remaining in the ED until their evaluation is complete.  X-ray to rule out bony fractures.    Karie Kirks 11/04/20 2054    Isla Pence, MD 11/07/20 [phone]

## 2020-11-05 DIAGNOSIS — M25561 Pain in right knee: Secondary | ICD-10-CM | POA: Diagnosis not present

## 2020-11-05 NOTE — Progress Notes (Signed)
Orthopedic Tech Progress Note Patient Details:  [name] 1946/04/13 [id number]  Ortho Devices Type of Ortho Device: Knee Immobilizer Ortho Device/Splint Location: rle Ortho Device/Splint Interventions: Ordered,Application,Adjustment   Post Interventions Patient Tolerated: Well Instructions Provided: Care of device,Adjustment of device   [name] 11/05/2020, 6:48 AM

## 2020-11-05 NOTE — ED Provider Notes (Signed)
[facility] EMERGENCY DEPARTMENT Provider Note   CSN: [id number] Arrival date & time: 11/04/20  2028     History Chief Complaint  Patient presents with  . Leg Pain    [name] is a 75 y.o. male.  The history is provided by the patient.  Leg Pain He has history of hypertension, hyperlipidemia, systolic and diastolic heart failure, coronary artery disease, chronic kidney disease, prostate cancer and comes in because of pain in his right knee.  He had suffered a tibial plateau fracture 3 years ago treated with open reduction and internal fixation.  He had been doing well until about 1 month ago when he started having pain in his right knee.  Yesterday, he was going up a ramp when he felt something pop in his right knee.  Since then, he has had severe pain if he tries to bear weight.  Pain is rated at 10/10 with weightbearing.  He has also noted some swelling in his knee.  He was not able to ambulate with his cane following this, he states it felt like his knee was giving way.   Past Medical History:  Diagnosis Date  . Arthritis    "left shoulder" (02/09/2015)  . Blind left eye   . CAD (coronary artery disease)   . Closed fracture of lateral portion of right tibial plateau 11/01/2017  . Complication of anesthesia    "they give me too much anesthesia & had to put me on life support for a little bit w/gallbladder OR"  . Coronary artery disease   . Former smoker   . GERD (gastroesophageal reflux disease)   . Gout   . History of bleeding ulcers   . History of gout   . History of peptic ulcer disease   . Hyperlipidemia   . Hypertension   . Hypertensive heart disease without CHF   . Kidney stones   . Lumbar disc disease   . Myocardial infarction [facility]) 1995; 2007  . Obesity   . OSA (obstructive sleep apnea)   . Pneumonia X 2  . Prostate cancer ([place])   . Pulmonary nodule   . Second degree Mobitz I AV block     Patient Active Problem List   Diagnosis Date  Noted  . Acute on chronic combined systolic and diastolic CHF (congestive heart failure) ([place]) 06/26/2020  . Allergic rhinitis 05/30/2019  . Second degree AV block 05/17/2019  . Chest pain 07/12/2018  . Chronic respiratory failure with hypoxia ([place]) 07/12/2018  . Abnormal EKG 07/12/2018  . Hypertension 07/12/2018  . Generalized abdominal pain   . Closed fracture of medial plateau of right tibia   . Closed fracture of lateral portion of right tibial plateau 11/01/2017  . Asthma, chronic, unspecified asthma severity, with acute exacerbation 05/06/2017  . History of peptic ulcer disease   . Lumbar disc disease   . Heart block AV second degree   . Hyperlipidemia 09/21/2014  . Pulmonary nodule   . OSA (obstructive sleep apnea) 04/12/2009  . Obesity (BMI 30-39.9)   . CAD (coronary artery disease) 04/11/2009  . Hypertensive heart disease without CHF   . GERD     Past Surgical History:  Procedure Laterality Date  . APPENDECTOMY    . APPENDECTOMY  ~ 1985  . BACK SURGERY    . CARDIAC CATHETERIZATION N/A 02/08/2015   Procedure: Left Heart Cath and Coronary Angiography;  Surgeon: Leonie Man, MD;  Location: [place] CV LAB;  Service: Cardiovascular;  Laterality: N/A;  . CARDIAC CATHETERIZATION N/A 02/08/2015   Procedure: Coronary Stent Intervention;  Surgeon: Leonie Man, MD;  Location: [place] CV LAB;  Service: Cardiovascular;  Laterality: N/A;  . CHOLECYSTECTOMY    . CORONARY ANGIOPLASTY WITH STENT PLACEMENT  ~ 1995; 2007   "1 stent; 2 stents"  . LAPAROSCOPIC CHOLECYSTECTOMY    . LEFT HEART CATH AND CORONARY ANGIOGRAPHY N/A 07/13/2018   Procedure: LEFT HEART CATH AND CORONARY ANGIOGRAPHY;  Surgeon: Martinique, Peter M, MD;  Location: [place] CV LAB;  Service: Cardiovascular;  Laterality: N/A;  . LITHOTRIPSY  X 1  . LUMBAR LAMINECTOMY    . ORIF TIBIA PLATEAU Right 11/05/2017   Procedure: OPEN REDUCTION INTERNAL FIXATION (ORIF) TIBIAL PLATEAU;  Surgeon: Shona Needles, MD;   Location: [place];  Service: Orthopedics;  Laterality: Right;  . PACEMAKER LEADLESS INSERTION N/A 05/17/2019   Procedure: PACEMAKER LEADLESS INSERTION;  Surgeon: Thompson Grayer, MD;  Location: [place] CV LAB;  Service: Cardiovascular;  Laterality: N/A;  . POSTERIOR LUMBAR FUSION  2003?  . PROSTATE BIOPSY  2015       Family History  Problem Relation Age of Onset  . Hypertension Mother   . Coronary artery disease Mother   . Aneurysm Father   . Hypertension Father   . COPD Sister   . Sleep apnea Other        3 siblings    Social History   Tobacco Use  . Smoking status: Former Smoker    Packs/day: 3.00    Years: 35.00    Pack years: 105.00    Types: Cigarettes    Quit date: 06/17/1983    Years since quitting: 37.4  . Smokeless tobacco: Never Used  . Tobacco comment: "quit smoking cigarettes in 1985  Substance Use Topics  . Alcohol use: Yes    Alcohol/week: 0.0 standard drinks    Comment: "I drank a whole lot when I was younger; nothing since 1990"  . Drug use: No    Home Medications Prior to Admission medications   Medication Sig Start Date End Date Taking? Authorizing Provider  acetaminophen (TYLENOL) 500 MG tablet Take 500 mg by mouth every 6 (six) hours as needed for moderate pain.    [provider]  albuterol (VENTOLIN HFA) 108 (90 Base) MCG/ACT inhaler Inhale 1-2 puffs into the lungs every 6 (six) hours as needed for wheezing or shortness of breath.    [provider]  allopurinol (ZYLOPRIM) 300 MG tablet Take 300 mg by mouth Daily.  11/07/11   [provider]  amLODipine (NORVASC) 10 MG tablet Take 10 mg by mouth Daily.  11/07/11   [provider]  Apple Cider Vinegar 300 MG TABS Take 2 tablets by mouth daily.    [provider]  aspirin EC 81 MG EC tablet Take 1 tablet (81 mg total) by mouth daily. 02/10/15   Jacolyn Reedy, MD  atorvastatin (LIPITOR) 40 MG tablet TAKE ONE TABLET BY MOUTH ONCE A WEEK 08/03/20   Kroeger,  Daleen Snook M., PA-C  diphenhydrAMINE (BENADRYL) 25 MG tablet Take 50 mg by mouth 2 (two) times daily as needed for allergies.    [provider]  furosemide (LASIX) 40 MG tablet Take 1 tablet (40 mg total) by mouth 2 (two) times daily. 07/04/20   Kroeger, Lorelee Cover., PA-C  ondansetron (ZOFRAN) 8 MG tablet Take 4-8 mg by mouth every 8 (eight) hours as needed for nausea or vomiting. 06/03/19   [provider]  pantoprazole (PROTONIX) 40 MG tablet Take 40 mg by mouth daily. 06/07/18   [provider]  potassium chloride SA (KLOR-CON) 20 MEQ tablet Take 1 tablet (20 mEq total) by mouth 2 (two) times daily. 07/04/20   Kroeger, Lorelee Cover., PA-C  sacubitril-valsartan (ENTRESTO) 24-26 MG Take 1 tablet by mouth 2 (two) times daily. 07/04/20   Kroeger, Lorelee Cover., PA-C  spironolactone (ALDACTONE) 25 MG tablet Take 1 tablet (25 mg total) by mouth daily. 07/05/20   Kroeger, Lorelee Cover., PA-C    Allergies    Prednisone and Atorvastatin  Review of Systems   Review of Systems  All other systems reviewed and are negative.   Physical Exam Updated Vital Signs BP 113/75 (BP Location: Right Arm)   Pulse 66   Temp 98 F (36.7 C) (Oral)   Resp 18   Ht 6\' 2"  (1.88 m)   Wt 135.2 kg   SpO2 95%   BMI 38.27 kg/m   Physical Exam Vitals and nursing note reviewed.   75 year old male, resting comfortably and in no acute distress. Vital signs are normal. Oxygen saturation is 95%, which is normal. Head is normocephalic and atraumatic. PERRLA, EOMI. Oropharynx is clear. Neck is nontender and supple without adenopathy or JVD. Back is nontender and there is no CVA tenderness. Lungs are clear without rales, wheezes, or rhonchi. Chest is nontender. Heart has regular rate and rhythm without murmur. Abdomen is soft, flat, nontender without masses or hepatosplenomegaly and peristalsis is normoactive. Extremities: Small effusion present in the right knee.  There is tenderness palpation in the medial  joint line of the right knee.  There is no instability on valgus or varus stress.  Lachman and McMurray's tests are negative. Skin is warm and dry without rash. Neurologic: Mental status is normal, cranial nerves are intact, there are no motor or sensory deficits.  ED Results / Procedures / Treatments    Radiology DG Knee Complete 4 Views Right  Result Date: 11/04/2020 CLINICAL DATA:  Right knee pain EXAM: RIGHT KNEE - COMPLETE 4+ VIEW COMPARISON:  None. FINDINGS: Screw and plate fixation of the proximal right tibia. No acute fracture or static subluxation. No knee effusion. IMPRESSION: No acute abnormality of the right knee. Electronically Signed   By: Ulyses Jarred M.D.   On: 11/04/2020 22:17    Procedures Procedures   Medications Ordered in ED Medications - No data to display  ED Course  I have reviewed the triage vital signs and the nursing notes.  Pertinent imaging results that were available during my care of the patient were reviewed by me and considered in my medical decision making (see chart for details).   MDM Rules/Calculators/A&P                         Pain and swelling of the right knee.  X-rays show postsurgical changes but no acute injury.  I suspect he had a meniscus tear.  No evidence of significant ligamentous injury.  He is placed in a knee immobilizer.  He feels that he would probably be able to ambulate with his cane with the knee immobilizer in place.  He is referred back to his orthopedic surgeon for further evaluation.  Unfortunately, he cannot tolerate NSAIDs because of his kidney disease.  Advised to use acetaminophen as needed for pain.  Final Clinical Impression(s) / ED Diagnoses Final diagnoses:  Pain and swelling of right knee    Rx / DC Orders  ED Discharge Orders    None       Delora Fuel, MD 20/94/70 [phone]

## 2020-11-05 NOTE — Discharge Instructions (Addendum)
Wear the immobilizer as needed.  Continue to use your cane to assist you with walking.  You may take acetaminophen as needed for pain.  Apply ice for 30 minutes at a time, 4 times a day.

## 2020-11-13 DIAGNOSIS — M25561 Pain in right knee: Secondary | ICD-10-CM | POA: Diagnosis not present

## 2020-11-27 DIAGNOSIS — G4733 Obstructive sleep apnea (adult) (pediatric): Secondary | ICD-10-CM | POA: Diagnosis not present

## 2020-12-03 DIAGNOSIS — G4733 Obstructive sleep apnea (adult) (pediatric): Secondary | ICD-10-CM | POA: Diagnosis not present

## 2021-01-07 ENCOUNTER — Other Ambulatory Visit: Payer: Self-pay | Admitting: Medical

## 2021-01-08 DIAGNOSIS — I10 Essential (primary) hypertension: Secondary | ICD-10-CM | POA: Diagnosis not present

## 2021-01-08 DIAGNOSIS — I252 Old myocardial infarction: Secondary | ICD-10-CM | POA: Diagnosis not present

## 2021-01-08 DIAGNOSIS — I2510 Atherosclerotic heart disease of native coronary artery without angina pectoris: Secondary | ICD-10-CM | POA: Diagnosis not present

## 2021-01-08 DIAGNOSIS — I5042 Chronic combined systolic (congestive) and diastolic (congestive) heart failure: Secondary | ICD-10-CM | POA: Diagnosis not present

## 2021-01-08 DIAGNOSIS — N1831 Chronic kidney disease, stage 3a: Secondary | ICD-10-CM | POA: Diagnosis not present

## 2021-01-08 DIAGNOSIS — K219 Gastro-esophageal reflux disease without esophagitis: Secondary | ICD-10-CM | POA: Diagnosis not present

## 2021-01-17 DIAGNOSIS — J45909 Unspecified asthma, uncomplicated: Secondary | ICD-10-CM | POA: Diagnosis not present

## 2021-01-17 DIAGNOSIS — I10 Essential (primary) hypertension: Secondary | ICD-10-CM | POA: Diagnosis not present

## 2021-01-17 DIAGNOSIS — K219 Gastro-esophageal reflux disease without esophagitis: Secondary | ICD-10-CM | POA: Diagnosis not present

## 2021-01-17 DIAGNOSIS — I5042 Chronic combined systolic (congestive) and diastolic (congestive) heart failure: Secondary | ICD-10-CM | POA: Diagnosis not present

## 2021-01-17 DIAGNOSIS — I2510 Atherosclerotic heart disease of native coronary artery without angina pectoris: Secondary | ICD-10-CM | POA: Diagnosis not present

## 2021-01-17 DIAGNOSIS — N1831 Chronic kidney disease, stage 3a: Secondary | ICD-10-CM | POA: Diagnosis not present

## 2021-01-17 DIAGNOSIS — E7800 Pure hypercholesterolemia, unspecified: Secondary | ICD-10-CM | POA: Diagnosis not present

## 2021-01-17 DIAGNOSIS — I129 Hypertensive chronic kidney disease with stage 1 through stage 4 chronic kidney disease, or unspecified chronic kidney disease: Secondary | ICD-10-CM | POA: Diagnosis not present

## 2021-02-04 ENCOUNTER — Other Ambulatory Visit: Payer: Self-pay | Admitting: Medical

## 2021-02-13 ENCOUNTER — Ambulatory Visit (INDEPENDENT_AMBULATORY_CARE_PROVIDER_SITE_OTHER): Payer: Medicare HMO

## 2021-02-13 DIAGNOSIS — I5042 Chronic combined systolic (congestive) and diastolic (congestive) heart failure: Secondary | ICD-10-CM | POA: Diagnosis not present

## 2021-02-13 DIAGNOSIS — I441 Atrioventricular block, second degree: Secondary | ICD-10-CM | POA: Diagnosis not present

## 2021-02-19 LAB — CUP PACEART REMOTE DEVICE CHECK
Battery Remaining Longevity: 96 mo
Battery Voltage: 3.01 V
Brady Statistic AS VP Percent: 77.56 %
Brady Statistic AS VS Percent: 0.07 %
Brady Statistic RV Percent Paced: 97.8 %
Date Time Interrogation Session: 20220831065600
Implantable Pulse Generator Implant Date: 20201201
Lead Channel Impedance Value: 560 Ohm
Lead Channel Pacing Threshold Amplitude: 0.375 V
Lead Channel Pacing Threshold Pulse Width: 0.24 ms
Lead Channel Sensing Intrinsic Amplitude: 27 mV
Lead Channel Setting Pacing Amplitude: 0.875
Lead Channel Setting Pacing Pulse Width: 0.24 ms
Lead Channel Setting Sensing Sensitivity: 2 mV

## 2021-02-26 NOTE — Progress Notes (Signed)
Remote pacemaker transmission.   

## 2021-03-08 ENCOUNTER — Telehealth: Payer: Self-pay | Admitting: Internal Medicine

## 2021-03-08 NOTE — Telephone Encounter (Signed)
Tried to call pt, left VM to call the office

## 2021-03-08 NOTE — Telephone Encounter (Signed)
Pt is returning call from message left this morning

## 2021-03-08 NOTE — Telephone Encounter (Signed)
The PCP Office was calling to report a weight gain. PCP states 297 was the lowest the patient's weight has ever been, and 301 is the highest his weight has ever been.  The patient is in a chronic care management program with his PCP. When he weighs himself daily the weight gets sent automatically to their office. The PCP has reached out to the patient but has not gotten a chance to actually speak to the patient yet to ask about other symptoms.  The PCP will reach out to [place] to report any information they get once they get in touch with the patient   Pt c/o swelling: STAT is pt has developed SOB within 24 hours  If swelling, where is the swelling located?   How much weight have you gained and in what time span?   Have you gained 3 pounds in a day or 5 pounds in a week? 5 lbs in a week  Do you have a log of your daily weights (if so, list)?    03/03/21: 299 03/04/21: 298  03/05/21: 299  03/06/21: 302  03/07/21: 302 03/08/21: 304  Are you currently taking a fluid pill? yes  Are you currently SOB?   Have you traveled recently?

## 2021-03-14 DIAGNOSIS — I10 Essential (primary) hypertension: Secondary | ICD-10-CM | POA: Diagnosis not present

## 2021-03-14 DIAGNOSIS — I129 Hypertensive chronic kidney disease with stage 1 through stage 4 chronic kidney disease, or unspecified chronic kidney disease: Secondary | ICD-10-CM | POA: Diagnosis not present

## 2021-03-14 DIAGNOSIS — J45909 Unspecified asthma, uncomplicated: Secondary | ICD-10-CM | POA: Diagnosis not present

## 2021-03-14 DIAGNOSIS — I252 Old myocardial infarction: Secondary | ICD-10-CM | POA: Diagnosis not present

## 2021-03-14 DIAGNOSIS — E7800 Pure hypercholesterolemia, unspecified: Secondary | ICD-10-CM | POA: Diagnosis not present

## 2021-03-14 DIAGNOSIS — I2510 Atherosclerotic heart disease of native coronary artery without angina pectoris: Secondary | ICD-10-CM | POA: Diagnosis not present

## 2021-03-14 DIAGNOSIS — I5042 Chronic combined systolic (congestive) and diastolic (congestive) heart failure: Secondary | ICD-10-CM | POA: Diagnosis not present

## 2021-03-14 DIAGNOSIS — K219 Gastro-esophageal reflux disease without esophagitis: Secondary | ICD-10-CM | POA: Diagnosis not present

## 2021-03-14 DIAGNOSIS — N1831 Chronic kidney disease, stage 3a: Secondary | ICD-10-CM | POA: Diagnosis not present

## 2021-03-14 NOTE — Telephone Encounter (Signed)
Called pt he states that "sometimes I forget to take my medications, then I take them the next day and the weight comes right back off". he states that his weight today is 297. He will continue to take medication as ordered and try not to forget. He will call back when needed

## 2021-03-18 ENCOUNTER — Telehealth: Payer: Self-pay | Admitting: Internal Medicine

## 2021-03-18 NOTE — Telephone Encounter (Signed)
Attempted to return call to [facility] [place] regarding patient. Unable to reach. Left message stating that there are no samples here of Entresto 24-26 but per PharmD there may be some tomorrow. Left message to call back tomorrow to discuss.   Will this leave message in Triage pool for follow up.

## 2021-03-18 NOTE — Telephone Encounter (Signed)
Pt c/o medication issue:  1. Name of Medication: ENTRESTO 24-26 MG  2. How are you currently taking this medication (dosage and times per day)? Has been out of it for a week  3. Are you having a reaction (difficulty breathing--STAT)? no  4. What is your medication issue? Katie from [place] states the patient has been out of the medication for a week and is waiting on patient assist. She states they would like to know if the patient can be put on another medication for the time being. GQHQI:[phone]

## 2021-03-19 NOTE — Telephone Encounter (Signed)
Called pt and informed samples are placed up front for pick.   Entresto 24-26 mg Qty: 1 bottle  Lot # [id number] Exp: 2/25

## 2021-04-01 DIAGNOSIS — R7303 Prediabetes: Secondary | ICD-10-CM | POA: Diagnosis not present

## 2021-04-01 DIAGNOSIS — N1831 Chronic kidney disease, stage 3a: Secondary | ICD-10-CM | POA: Diagnosis not present

## 2021-04-01 DIAGNOSIS — I129 Hypertensive chronic kidney disease with stage 1 through stage 4 chronic kidney disease, or unspecified chronic kidney disease: Secondary | ICD-10-CM | POA: Diagnosis not present

## 2021-04-01 DIAGNOSIS — H543 Unqualified visual loss, both eyes: Secondary | ICD-10-CM | POA: Diagnosis not present

## 2021-04-01 DIAGNOSIS — J9611 Chronic respiratory failure with hypoxia: Secondary | ICD-10-CM | POA: Diagnosis not present

## 2021-04-01 DIAGNOSIS — E7800 Pure hypercholesterolemia, unspecified: Secondary | ICD-10-CM | POA: Diagnosis not present

## 2021-04-01 DIAGNOSIS — K219 Gastro-esophageal reflux disease without esophagitis: Secondary | ICD-10-CM | POA: Diagnosis not present

## 2021-04-01 DIAGNOSIS — I441 Atrioventricular block, second degree: Secondary | ICD-10-CM | POA: Diagnosis not present

## 2021-04-01 DIAGNOSIS — C61 Malignant neoplasm of prostate: Secondary | ICD-10-CM | POA: Diagnosis not present

## 2021-04-01 DIAGNOSIS — I2510 Atherosclerotic heart disease of native coronary artery without angina pectoris: Secondary | ICD-10-CM | POA: Diagnosis not present

## 2021-04-04 DIAGNOSIS — L819 Disorder of pigmentation, unspecified: Secondary | ICD-10-CM | POA: Diagnosis not present

## 2021-04-04 DIAGNOSIS — L818 Other specified disorders of pigmentation: Secondary | ICD-10-CM | POA: Diagnosis not present

## 2021-04-04 DIAGNOSIS — C4442 Squamous cell carcinoma of skin of scalp and neck: Secondary | ICD-10-CM | POA: Diagnosis not present

## 2021-04-04 DIAGNOSIS — L570 Actinic keratosis: Secondary | ICD-10-CM | POA: Diagnosis not present

## 2021-04-04 DIAGNOSIS — D492 Neoplasm of unspecified behavior of bone, soft tissue, and skin: Secondary | ICD-10-CM | POA: Diagnosis not present

## 2021-04-12 DIAGNOSIS — E7800 Pure hypercholesterolemia, unspecified: Secondary | ICD-10-CM | POA: Diagnosis not present

## 2021-04-12 DIAGNOSIS — I5042 Chronic combined systolic (congestive) and diastolic (congestive) heart failure: Secondary | ICD-10-CM | POA: Diagnosis not present

## 2021-04-12 DIAGNOSIS — K219 Gastro-esophageal reflux disease without esophagitis: Secondary | ICD-10-CM | POA: Diagnosis not present

## 2021-04-12 DIAGNOSIS — J45909 Unspecified asthma, uncomplicated: Secondary | ICD-10-CM | POA: Diagnosis not present

## 2021-04-12 DIAGNOSIS — I2510 Atherosclerotic heart disease of native coronary artery without angina pectoris: Secondary | ICD-10-CM | POA: Diagnosis not present

## 2021-04-12 DIAGNOSIS — N1831 Chronic kidney disease, stage 3a: Secondary | ICD-10-CM | POA: Diagnosis not present

## 2021-04-12 DIAGNOSIS — I10 Essential (primary) hypertension: Secondary | ICD-10-CM | POA: Diagnosis not present

## 2021-04-12 DIAGNOSIS — I129 Hypertensive chronic kidney disease with stage 1 through stage 4 chronic kidney disease, or unspecified chronic kidney disease: Secondary | ICD-10-CM | POA: Diagnosis not present

## 2021-04-23 DIAGNOSIS — N1832 Chronic kidney disease, stage 3b: Secondary | ICD-10-CM | POA: Diagnosis not present

## 2021-04-24 ENCOUNTER — Other Ambulatory Visit: Payer: Self-pay | Admitting: Medical

## 2021-04-24 NOTE — Telephone Encounter (Signed)
This is Dr. Hilty's pt 

## 2021-05-01 DIAGNOSIS — I252 Old myocardial infarction: Secondary | ICD-10-CM | POA: Diagnosis not present

## 2021-05-01 DIAGNOSIS — I10 Essential (primary) hypertension: Secondary | ICD-10-CM | POA: Diagnosis not present

## 2021-05-01 DIAGNOSIS — I2510 Atherosclerotic heart disease of native coronary artery without angina pectoris: Secondary | ICD-10-CM | POA: Diagnosis not present

## 2021-05-01 DIAGNOSIS — K219 Gastro-esophageal reflux disease without esophagitis: Secondary | ICD-10-CM | POA: Diagnosis not present

## 2021-05-01 DIAGNOSIS — I129 Hypertensive chronic kidney disease with stage 1 through stage 4 chronic kidney disease, or unspecified chronic kidney disease: Secondary | ICD-10-CM | POA: Diagnosis not present

## 2021-05-01 DIAGNOSIS — E7800 Pure hypercholesterolemia, unspecified: Secondary | ICD-10-CM | POA: Diagnosis not present

## 2021-05-01 DIAGNOSIS — J45909 Unspecified asthma, uncomplicated: Secondary | ICD-10-CM | POA: Diagnosis not present

## 2021-05-01 DIAGNOSIS — N1831 Chronic kidney disease, stage 3a: Secondary | ICD-10-CM | POA: Diagnosis not present

## 2021-05-01 DIAGNOSIS — I5042 Chronic combined systolic (congestive) and diastolic (congestive) heart failure: Secondary | ICD-10-CM | POA: Diagnosis not present

## 2021-05-02 ENCOUNTER — Other Ambulatory Visit: Payer: Self-pay | Admitting: Medical

## 2021-05-02 NOTE — Telephone Encounter (Signed)
This is Dr. Hilty's pt 

## 2021-05-15 DIAGNOSIS — I5042 Chronic combined systolic (congestive) and diastolic (congestive) heart failure: Secondary | ICD-10-CM | POA: Diagnosis not present

## 2021-05-20 DIAGNOSIS — N1832 Chronic kidney disease, stage 3b: Secondary | ICD-10-CM | POA: Diagnosis not present

## 2021-05-21 DIAGNOSIS — E7800 Pure hypercholesterolemia, unspecified: Secondary | ICD-10-CM | POA: Diagnosis not present

## 2021-05-21 DIAGNOSIS — I2510 Atherosclerotic heart disease of native coronary artery without angina pectoris: Secondary | ICD-10-CM | POA: Diagnosis not present

## 2021-05-21 DIAGNOSIS — I129 Hypertensive chronic kidney disease with stage 1 through stage 4 chronic kidney disease, or unspecified chronic kidney disease: Secondary | ICD-10-CM | POA: Diagnosis not present

## 2021-05-21 DIAGNOSIS — I10 Essential (primary) hypertension: Secondary | ICD-10-CM | POA: Diagnosis not present

## 2021-05-21 DIAGNOSIS — I252 Old myocardial infarction: Secondary | ICD-10-CM | POA: Diagnosis not present

## 2021-05-21 DIAGNOSIS — I5042 Chronic combined systolic (congestive) and diastolic (congestive) heart failure: Secondary | ICD-10-CM | POA: Diagnosis not present

## 2021-05-21 DIAGNOSIS — J45909 Unspecified asthma, uncomplicated: Secondary | ICD-10-CM | POA: Diagnosis not present

## 2021-05-21 DIAGNOSIS — N183 Chronic kidney disease, stage 3 unspecified: Secondary | ICD-10-CM | POA: Diagnosis not present

## 2021-05-21 DIAGNOSIS — K219 Gastro-esophageal reflux disease without esophagitis: Secondary | ICD-10-CM | POA: Diagnosis not present

## 2021-05-27 DIAGNOSIS — C4442 Squamous cell carcinoma of skin of scalp and neck: Secondary | ICD-10-CM | POA: Diagnosis not present

## 2021-06-14 DIAGNOSIS — I5042 Chronic combined systolic (congestive) and diastolic (congestive) heart failure: Secondary | ICD-10-CM | POA: Diagnosis not present

## 2021-06-19 DIAGNOSIS — E261 Secondary hyperaldosteronism: Secondary | ICD-10-CM | POA: Diagnosis not present

## 2021-06-19 DIAGNOSIS — I255 Ischemic cardiomyopathy: Secondary | ICD-10-CM | POA: Diagnosis not present

## 2021-06-19 DIAGNOSIS — C61 Malignant neoplasm of prostate: Secondary | ICD-10-CM | POA: Diagnosis not present

## 2021-06-19 DIAGNOSIS — J449 Chronic obstructive pulmonary disease, unspecified: Secondary | ICD-10-CM | POA: Diagnosis not present

## 2021-06-19 DIAGNOSIS — I509 Heart failure, unspecified: Secondary | ICD-10-CM | POA: Diagnosis not present

## 2021-06-19 DIAGNOSIS — I252 Old myocardial infarction: Secondary | ICD-10-CM | POA: Diagnosis not present

## 2021-06-19 DIAGNOSIS — I2510 Atherosclerotic heart disease of native coronary artery without angina pectoris: Secondary | ICD-10-CM | POA: Diagnosis not present

## 2021-06-19 DIAGNOSIS — E785 Hyperlipidemia, unspecified: Secondary | ICD-10-CM | POA: Diagnosis not present

## 2021-06-19 DIAGNOSIS — Z008 Encounter for other general examination: Secondary | ICD-10-CM | POA: Diagnosis not present

## 2021-06-19 DIAGNOSIS — J309 Allergic rhinitis, unspecified: Secondary | ICD-10-CM | POA: Diagnosis not present

## 2021-06-19 DIAGNOSIS — G63 Polyneuropathy in diseases classified elsewhere: Secondary | ICD-10-CM | POA: Diagnosis not present

## 2021-06-19 DIAGNOSIS — I130 Hypertensive heart and chronic kidney disease with heart failure and stage 1 through stage 4 chronic kidney disease, or unspecified chronic kidney disease: Secondary | ICD-10-CM | POA: Diagnosis not present

## 2021-07-11 DIAGNOSIS — N183 Chronic kidney disease, stage 3 unspecified: Secondary | ICD-10-CM | POA: Diagnosis not present

## 2021-07-16 DIAGNOSIS — I5042 Chronic combined systolic (congestive) and diastolic (congestive) heart failure: Secondary | ICD-10-CM | POA: Diagnosis not present

## 2021-08-06 DIAGNOSIS — E7800 Pure hypercholesterolemia, unspecified: Secondary | ICD-10-CM | POA: Diagnosis not present

## 2021-08-06 DIAGNOSIS — I5042 Chronic combined systolic (congestive) and diastolic (congestive) heart failure: Secondary | ICD-10-CM | POA: Diagnosis not present

## 2021-08-06 DIAGNOSIS — I10 Essential (primary) hypertension: Secondary | ICD-10-CM | POA: Diagnosis not present

## 2021-08-06 DIAGNOSIS — N1832 Chronic kidney disease, stage 3b: Secondary | ICD-10-CM | POA: Diagnosis not present

## 2021-08-08 DIAGNOSIS — C61 Malignant neoplasm of prostate: Secondary | ICD-10-CM | POA: Diagnosis not present

## 2021-08-08 DIAGNOSIS — N400 Enlarged prostate without lower urinary tract symptoms: Secondary | ICD-10-CM | POA: Diagnosis not present

## 2021-08-08 DIAGNOSIS — N1832 Chronic kidney disease, stage 3b: Secondary | ICD-10-CM | POA: Diagnosis not present

## 2021-09-02 DIAGNOSIS — R110 Nausea: Secondary | ICD-10-CM | POA: Diagnosis not present

## 2021-09-02 DIAGNOSIS — R42 Dizziness and giddiness: Secondary | ICD-10-CM | POA: Diagnosis not present

## 2021-09-05 DIAGNOSIS — N1832 Chronic kidney disease, stage 3b: Secondary | ICD-10-CM | POA: Diagnosis not present

## 2021-09-09 DIAGNOSIS — N184 Chronic kidney disease, stage 4 (severe): Secondary | ICD-10-CM | POA: Diagnosis not present

## 2021-09-13 DIAGNOSIS — I5042 Chronic combined systolic (congestive) and diastolic (congestive) heart failure: Secondary | ICD-10-CM | POA: Diagnosis not present

## 2021-09-17 DIAGNOSIS — N1832 Chronic kidney disease, stage 3b: Secondary | ICD-10-CM | POA: Diagnosis not present

## 2021-09-19 ENCOUNTER — Telehealth: Payer: Self-pay | Admitting: Internal Medicine

## 2021-09-19 DIAGNOSIS — I5042 Chronic combined systolic (congestive) and diastolic (congestive) heart failure: Secondary | ICD-10-CM

## 2021-09-19 NOTE — Telephone Encounter (Signed)
Spoke with Dr. Alyson Ingles regarding mutual patient ?He is concerned about worsening renal function ? ?GFR has dropped significantly since May 2022 ?Was 24 -- Decreased to 37 -- Last 6 months decreased to 26 -- Current 30-33 ?Dr. Alyson Ingles has order nephrology consult  ?Dr. Alyson Ingles is asking if any meds can be adjusted, specifically ACEi/ARB -- explained patient on Entresto at lowest dose. Dr. Alyson Ingles also states patient is taking lasix '40mg'$  BID and spironolactone '25mg'$  QD ? ?Asked that he fax over a few sets of lab results to be reviewed.  ?Advised will reach out to patient for an appointment as his last was Feb 2022 ? ?Dr. Alyson Ingles would like a follow up call from Dr. Debara Pickett and can be reached on his cell: [phone] ?He is aware Dr. Debara Pickett is out of the office today, returning tomorrow.  ? ?

## 2021-09-20 NOTE — Telephone Encounter (Signed)
Spoke with patient -- he reports NO weight gain, NO swelling. He weighed 274 lbs this morning. He reports this weight has decreased from around 300lbs. He reports he weighs daily and weights are transmitted to PCP office. He did not report any new/worsening shortness of breath. ? ?Advised to STOP entresto, DECREASE lasix to '40mg'$  once daily, continue other meds ?Advised to do lab work in 2 weeks at [place] he will get call to schedule echo/follow up ? ?His grandson [name] helps with his care. He provided authorization that I can call him to relay info. [name] also has proxy access to MyChart for [name] - message sent there also.  ?

## 2021-09-20 NOTE — Telephone Encounter (Signed)
Spoke with Dr. Alyson Ingles today regarding our mutual patient.  It turns out that his GFR has been declining.  He has not been seen recently in the office either by his PCP or myself.  He will need an office visit as well as an echocardiogram to update LVEF.  If his EF is significantly improved, it may be an issue of just being on too much diuretic.  For now I would advise stopping his Entresto, decreasing Lasix to 40 mg daily and continuing Aldactone.  I would recommend we assess a metabolic profile and BNP in about 2 weeks and schedule follow-up in the office after that.  He is also been apparently referred to nephrology however that may take a few months for an appointment.  He is contact [name] with our advice. ? ?-Mali ?

## 2021-09-25 ENCOUNTER — Encounter: Payer: Self-pay | Admitting: Cardiovascular Disease

## 2021-09-25 NOTE — Telephone Encounter (Signed)
error 

## 2021-09-26 ENCOUNTER — Encounter (HOSPITAL_COMMUNITY): Payer: Self-pay | Admitting: Internal Medicine

## 2021-09-26 DIAGNOSIS — I10 Essential (primary) hypertension: Secondary | ICD-10-CM | POA: Diagnosis not present

## 2021-09-26 DIAGNOSIS — K219 Gastro-esophageal reflux disease without esophagitis: Secondary | ICD-10-CM | POA: Diagnosis not present

## 2021-09-26 DIAGNOSIS — I129 Hypertensive chronic kidney disease with stage 1 through stage 4 chronic kidney disease, or unspecified chronic kidney disease: Secondary | ICD-10-CM | POA: Diagnosis not present

## 2021-09-26 DIAGNOSIS — E7800 Pure hypercholesterolemia, unspecified: Secondary | ICD-10-CM | POA: Diagnosis not present

## 2021-09-26 DIAGNOSIS — I5042 Chronic combined systolic (congestive) and diastolic (congestive) heart failure: Secondary | ICD-10-CM | POA: Diagnosis not present

## 2021-10-02 ENCOUNTER — Telehealth: Payer: Self-pay | Admitting: Internal Medicine

## 2021-10-02 NOTE — Telephone Encounter (Signed)
Fidel Levy, RN  P Cv Div Nl Scheduling ?Cc: Fidel Levy, RN ?Hey!  ? ?This patient needs an echo appointment and a follow up with Dr. Debara Pickett (OK to schedule in acute DOD appointment slot)  ? ?Thanks!  ? ? ? ?Contacted patient 3x to schedule above appt, LVM all 3x with no success in scheduling. Sending letter and MyChart message to patient.  ?

## 2021-10-13 DIAGNOSIS — I5042 Chronic combined systolic (congestive) and diastolic (congestive) heart failure: Secondary | ICD-10-CM | POA: Diagnosis not present

## 2021-10-14 ENCOUNTER — Ambulatory Visit (HOSPITAL_COMMUNITY): Payer: Medicare HMO | Attending: Internal Medicine

## 2021-10-14 DIAGNOSIS — I5042 Chronic combined systolic (congestive) and diastolic (congestive) heart failure: Secondary | ICD-10-CM | POA: Diagnosis not present

## 2021-10-14 LAB — ECHOCARDIOGRAM COMPLETE
Area-P 1/2: 2.79 cm2
S' Lateral: 3.9 cm

## 2021-10-17 ENCOUNTER — Other Ambulatory Visit: Payer: Self-pay | Admitting: Medical

## 2021-10-24 ENCOUNTER — Encounter: Payer: Self-pay | Admitting: Internal Medicine

## 2021-11-12 DIAGNOSIS — I5042 Chronic combined systolic (congestive) and diastolic (congestive) heart failure: Secondary | ICD-10-CM | POA: Diagnosis not present

## 2021-11-25 ENCOUNTER — Other Ambulatory Visit: Payer: Self-pay | Admitting: Internal Medicine

## 2021-11-26 ENCOUNTER — Other Ambulatory Visit: Payer: Self-pay | Admitting: Nephrology

## 2021-11-26 DIAGNOSIS — N189 Chronic kidney disease, unspecified: Secondary | ICD-10-CM | POA: Diagnosis not present

## 2021-11-26 DIAGNOSIS — D631 Anemia in chronic kidney disease: Secondary | ICD-10-CM | POA: Diagnosis not present

## 2021-11-26 DIAGNOSIS — N1832 Chronic kidney disease, stage 3b: Secondary | ICD-10-CM

## 2021-11-26 DIAGNOSIS — I5042 Chronic combined systolic (congestive) and diastolic (congestive) heart failure: Secondary | ICD-10-CM | POA: Diagnosis not present

## 2021-11-26 DIAGNOSIS — I2510 Atherosclerotic heart disease of native coronary artery without angina pectoris: Secondary | ICD-10-CM | POA: Diagnosis not present

## 2021-11-26 DIAGNOSIS — I129 Hypertensive chronic kidney disease with stage 1 through stage 4 chronic kidney disease, or unspecified chronic kidney disease: Secondary | ICD-10-CM | POA: Diagnosis not present

## 2021-11-26 DIAGNOSIS — Z8679 Personal history of other diseases of the circulatory system: Secondary | ICD-10-CM | POA: Diagnosis not present

## 2021-11-28 ENCOUNTER — Ambulatory Visit
Admission: RE | Admit: 2021-11-28 | Discharge: 2021-11-28 | Disposition: A | Discharge status: Home / Self Care | Payer: Medicare HMO | Source: Ambulatory Visit | Attending: Nephrology | Admitting: Nephrology

## 2021-11-28 DIAGNOSIS — N189 Chronic kidney disease, unspecified: Secondary | ICD-10-CM | POA: Diagnosis not present

## 2021-11-28 DIAGNOSIS — N1832 Chronic kidney disease, stage 3b: Secondary | ICD-10-CM

## 2021-12-02 ENCOUNTER — Other Ambulatory Visit (HOSPITAL_COMMUNITY): Payer: Self-pay | Admitting: Nephrology

## 2021-12-02 ENCOUNTER — Other Ambulatory Visit: Payer: Self-pay | Admitting: Nephrology

## 2021-12-03 ENCOUNTER — Other Ambulatory Visit (HOSPITAL_COMMUNITY): Payer: Self-pay | Admitting: Nephrology

## 2021-12-06 DIAGNOSIS — C61 Malignant neoplasm of prostate: Secondary | ICD-10-CM | POA: Diagnosis not present

## 2021-12-06 DIAGNOSIS — I5042 Chronic combined systolic (congestive) and diastolic (congestive) heart failure: Secondary | ICD-10-CM | POA: Diagnosis not present

## 2021-12-06 DIAGNOSIS — E782 Mixed hyperlipidemia: Secondary | ICD-10-CM | POA: Diagnosis not present

## 2021-12-06 DIAGNOSIS — I10 Essential (primary) hypertension: Secondary | ICD-10-CM | POA: Diagnosis not present

## 2021-12-06 DIAGNOSIS — I2510 Atherosclerotic heart disease of native coronary artery without angina pectoris: Secondary | ICD-10-CM | POA: Diagnosis not present

## 2021-12-06 DIAGNOSIS — R7303 Prediabetes: Secondary | ICD-10-CM | POA: Diagnosis not present

## 2021-12-06 DIAGNOSIS — N1832 Chronic kidney disease, stage 3b: Secondary | ICD-10-CM | POA: Diagnosis not present

## 2021-12-06 DIAGNOSIS — D692 Other nonthrombocytopenic purpura: Secondary | ICD-10-CM | POA: Diagnosis not present

## 2021-12-06 DIAGNOSIS — I700 Atherosclerosis of aorta: Secondary | ICD-10-CM | POA: Diagnosis not present

## 2021-12-06 DIAGNOSIS — Z0000 Encounter for general adult medical examination without abnormal findings: Secondary | ICD-10-CM | POA: Diagnosis not present

## 2021-12-06 DIAGNOSIS — Z1331 Encounter for screening for depression: Secondary | ICD-10-CM | POA: Diagnosis not present

## 2021-12-06 DIAGNOSIS — K219 Gastro-esophageal reflux disease without esophagitis: Secondary | ICD-10-CM | POA: Diagnosis not present

## 2021-12-06 DIAGNOSIS — Z23 Encounter for immunization: Secondary | ICD-10-CM | POA: Diagnosis not present

## 2021-12-06 DIAGNOSIS — M109 Gout, unspecified: Secondary | ICD-10-CM | POA: Diagnosis not present

## 2021-12-12 ENCOUNTER — Ambulatory Visit (HOSPITAL_COMMUNITY)
Admission: RE | Admit: 2021-12-12 | Discharge: 2021-12-12 | Disposition: A | Discharge status: Home / Self Care | Payer: Medicare HMO | Source: Ambulatory Visit | Attending: Nephrology | Admitting: Nephrology

## 2021-12-12 ENCOUNTER — Encounter (HOSPITAL_COMMUNITY)
Admission: RE | Admit: 2021-12-12 | Discharge: 2021-12-12 | Disposition: A | Discharge status: Home / Self Care | Payer: Medicare HMO | Source: Ambulatory Visit | Attending: Nephrology | Admitting: Nephrology

## 2021-12-12 MED ORDER — TECHNETIUM TC 99M SESTAMIBI - CARDIOLITE
25.0000 | Freq: Once | INTRAVENOUS | Status: AC | PRN
  Administered 2021-12-12: 25.6 via INTRAVENOUS

## 2021-12-13 DIAGNOSIS — I5042 Chronic combined systolic (congestive) and diastolic (congestive) heart failure: Secondary | ICD-10-CM | POA: Diagnosis not present

## 2022-01-09 DIAGNOSIS — I5042 Chronic combined systolic (congestive) and diastolic (congestive) heart failure: Secondary | ICD-10-CM | POA: Diagnosis not present

## 2022-01-09 DIAGNOSIS — I10 Essential (primary) hypertension: Secondary | ICD-10-CM | POA: Diagnosis not present

## 2022-01-09 DIAGNOSIS — K219 Gastro-esophageal reflux disease without esophagitis: Secondary | ICD-10-CM | POA: Diagnosis not present

## 2022-01-09 DIAGNOSIS — I129 Hypertensive chronic kidney disease with stage 1 through stage 4 chronic kidney disease, or unspecified chronic kidney disease: Secondary | ICD-10-CM | POA: Diagnosis not present

## 2022-01-09 DIAGNOSIS — E782 Mixed hyperlipidemia: Secondary | ICD-10-CM | POA: Diagnosis not present

## 2022-01-09 DIAGNOSIS — N1832 Chronic kidney disease, stage 3b: Secondary | ICD-10-CM | POA: Diagnosis not present

## 2022-01-13 DIAGNOSIS — I5042 Chronic combined systolic (congestive) and diastolic (congestive) heart failure: Secondary | ICD-10-CM | POA: Diagnosis not present

## 2022-01-17 ENCOUNTER — Other Ambulatory Visit: Payer: Self-pay | Admitting: Internal Medicine

## 2022-01-20 DIAGNOSIS — C61 Malignant neoplasm of prostate: Secondary | ICD-10-CM | POA: Diagnosis not present

## 2022-01-21 DIAGNOSIS — I129 Hypertensive chronic kidney disease with stage 1 through stage 4 chronic kidney disease, or unspecified chronic kidney disease: Secondary | ICD-10-CM | POA: Diagnosis not present

## 2022-01-21 DIAGNOSIS — K219 Gastro-esophageal reflux disease without esophagitis: Secondary | ICD-10-CM | POA: Diagnosis not present

## 2022-01-21 DIAGNOSIS — N1832 Chronic kidney disease, stage 3b: Secondary | ICD-10-CM | POA: Diagnosis not present

## 2022-01-21 DIAGNOSIS — I5042 Chronic combined systolic (congestive) and diastolic (congestive) heart failure: Secondary | ICD-10-CM | POA: Diagnosis not present

## 2022-01-21 DIAGNOSIS — E7800 Pure hypercholesterolemia, unspecified: Secondary | ICD-10-CM | POA: Diagnosis not present

## 2022-01-21 DIAGNOSIS — I10 Essential (primary) hypertension: Secondary | ICD-10-CM | POA: Diagnosis not present

## 2022-01-27 DIAGNOSIS — N400 Enlarged prostate without lower urinary tract symptoms: Secondary | ICD-10-CM | POA: Diagnosis not present

## 2022-01-27 DIAGNOSIS — C61 Malignant neoplasm of prostate: Secondary | ICD-10-CM | POA: Diagnosis not present

## 2022-01-29 ENCOUNTER — Other Ambulatory Visit: Payer: Self-pay | Admitting: Internal Medicine

## 2022-03-06 ENCOUNTER — Emergency Department (HOSPITAL_COMMUNITY): Payer: Medicare HMO

## 2022-03-06 ENCOUNTER — Encounter (HOSPITAL_COMMUNITY): Payer: Self-pay | Admitting: Emergency Medicine

## 2022-03-06 ENCOUNTER — Emergency Department (HOSPITAL_COMMUNITY)
Admission: EM | Admit: 2022-03-06 | Discharge: 2022-03-07 | Disposition: A | Discharge status: Home / Self Care | Payer: Medicare HMO | Attending: Emergency Medicine | Admitting: Emergency Medicine

## 2022-03-06 ENCOUNTER — Other Ambulatory Visit: Payer: Self-pay

## 2022-03-06 DIAGNOSIS — Z7982 Long term (current) use of aspirin: Secondary | ICD-10-CM | POA: Diagnosis not present

## 2022-03-06 DIAGNOSIS — R059 Cough, unspecified: Secondary | ICD-10-CM | POA: Diagnosis not present

## 2022-03-06 DIAGNOSIS — R1084 Generalized abdominal pain: Secondary | ICD-10-CM | POA: Insufficient documentation

## 2022-03-06 DIAGNOSIS — I10 Essential (primary) hypertension: Secondary | ICD-10-CM | POA: Diagnosis not present

## 2022-03-06 DIAGNOSIS — K5730 Diverticulosis of large intestine without perforation or abscess without bleeding: Secondary | ICD-10-CM | POA: Diagnosis not present

## 2022-03-06 DIAGNOSIS — Z20822 Contact with and (suspected) exposure to covid-19: Secondary | ICD-10-CM | POA: Insufficient documentation

## 2022-03-06 DIAGNOSIS — N281 Cyst of kidney, acquired: Secondary | ICD-10-CM | POA: Diagnosis not present

## 2022-03-06 DIAGNOSIS — R112 Nausea with vomiting, unspecified: Secondary | ICD-10-CM | POA: Diagnosis not present

## 2022-03-06 DIAGNOSIS — R0602 Shortness of breath: Secondary | ICD-10-CM | POA: Diagnosis not present

## 2022-03-06 DIAGNOSIS — R197 Diarrhea, unspecified: Secondary | ICD-10-CM | POA: Insufficient documentation

## 2022-03-06 LAB — CBC WITH DIFFERENTIAL/PLATELET
Abs Immature Granulocytes: 0.05 10*3/uL (ref 0.00–0.07)
Basophils Absolute: 0 10*3/uL (ref 0.0–0.1)
Basophils Relative: 0 %
Eosinophils Absolute: 0.1 10*3/uL (ref 0.0–0.5)
Eosinophils Relative: 0 %
HCT: 44.2 % (ref 39.0–52.0)
Hemoglobin: 14.6 g/dL (ref 13.0–17.0)
Immature Granulocytes: 0 %
Lymphocytes Relative: 5 %
Lymphs Abs: 0.9 10*3/uL (ref 0.7–4.0)
MCH: 30.1 pg (ref 26.0–34.0)
MCHC: 33 g/dL (ref 30.0–36.0)
MCV: 91.1 fL (ref 80.0–100.0)
Monocytes Absolute: 1.6 10*3/uL — ABNORMAL HIGH (ref 0.1–1.0)
Monocytes Relative: 9 %
Neutro Abs: 14.4 10*3/uL — ABNORMAL HIGH (ref 1.7–7.7)
Neutrophils Relative %: 86 %
Platelets: 241 10*3/uL (ref 150–400)
RBC: 4.85 MIL/uL (ref 4.22–5.81)
RDW: 13.9 % (ref 11.5–15.5)
WBC: 17 10*3/uL — ABNORMAL HIGH (ref 4.0–10.5)
nRBC: 0 % (ref 0.0–0.2)

## 2022-03-06 LAB — COMPREHENSIVE METABOLIC PANEL
ALT: 15 U/L (ref 0–44)
AST: 18 U/L (ref 15–41)
Albumin: 3.9 g/dL (ref 3.5–5.0)
Alkaline Phosphatase: 49 U/L (ref 38–126)
Anion gap: 12 (ref 5–15)
BUN: 30 mg/dL — ABNORMAL HIGH (ref 8–23)
CO2: 21 mmol/L — ABNORMAL LOW (ref 22–32)
Calcium: 10.3 mg/dL (ref 8.9–10.3)
Chloride: 105 mmol/L (ref 98–111)
Creatinine, Ser: 1.5 mg/dL — ABNORMAL HIGH (ref 0.61–1.24)
GFR, Estimated: 48 mL/min — ABNORMAL LOW (ref >60)
Glucose, Bld: 139 mg/dL — ABNORMAL HIGH (ref 70–99)
Potassium: 4.3 mmol/L (ref 3.5–5.1)
Sodium: 138 mmol/L (ref 135–145)
Total Bilirubin: 1.1 mg/dL (ref 0.3–1.2)
Total Protein: 7.3 g/dL (ref 6.5–8.1)

## 2022-03-06 LAB — SARS CORONAVIRUS 2 BY RT PCR: SARS Coronavirus 2 by RT PCR: NEGATIVE

## 2022-03-06 MED ORDER — ONDANSETRON 4 MG PO TBDP
4.0000 mg | ORAL_TABLET | Freq: Once | ORAL | Status: AC
  Administered 2022-03-06: 4 mg via ORAL
  Filled 2022-03-06: qty 1

## 2022-03-06 NOTE — ED Triage Notes (Signed)
Pt reported to ED with c/o  nausea, dry heaves, chills, cough and shortness of breath since approximately 3pm.

## 2022-03-06 NOTE — ED Provider Triage Note (Signed)
Emergency Medicine Provider Triage Evaluation Note  [name] , a 76 y.o. male  was evaluated in triage.  Pt complains of nausea and emesis onset today. Denies sick contacts. No meds tried. Denies chest pain, shortness of breath, abdominal pain, urinary symptoms.  Review of Systems  Positive:  Negative:   Physical Exam  BP (!) 124/56   Pulse 65   Temp 98.6 F (37 C) (Oral)   Resp 18   SpO2 98%  Gen:   Awake, no distress   Resp:  Normal effort  MSK:   Moves extremities without difficulty  Other:    Medical Decision Making  Medically screening exam initiated at 8:55 PM.  Appropriate orders placed.  [name] was informed that the remainder of the evaluation will be completed by another provider, this initial triage assessment does not replace that evaluation, and the importance of remaining in the ED until their evaluation is complete.  Work-up initiated.    Darrold Bezek A, PA-C 03/06/22 2105

## 2022-03-07 ENCOUNTER — Emergency Department (HOSPITAL_COMMUNITY): Payer: Medicare HMO

## 2022-03-07 DIAGNOSIS — K5730 Diverticulosis of large intestine without perforation or abscess without bleeding: Secondary | ICD-10-CM | POA: Diagnosis not present

## 2022-03-07 DIAGNOSIS — N281 Cyst of kidney, acquired: Secondary | ICD-10-CM | POA: Diagnosis not present

## 2022-03-07 MED ORDER — ONDANSETRON HCL 8 MG PO TABS: 8.0000 mg | ORAL_TABLET | ORAL | 0 refills | Status: DC | PRN

## 2022-03-07 MED ORDER — IOHEXOL 350 MG/ML SOLN
80.0000 mL | Freq: Once | INTRAVENOUS | Status: AC | PRN
  Administered 2022-03-07: 80 mL via INTRAVENOUS

## 2022-03-07 MED ORDER — LACTATED RINGERS IV BOLUS
500.0000 mL | Freq: Once | INTRAVENOUS | Status: AC
  Administered 2022-03-07: 500 mL via INTRAVENOUS

## 2022-03-07 NOTE — ED Notes (Signed)
Assisted up to bathroom , steady gait. Drinking sprite and crackers.

## 2022-03-07 NOTE — ED Provider Notes (Signed)
[facility] EMERGENCY DEPARTMENT Provider Note   CSN: [id number] Arrival date & time: 03/06/22  2016     History  Chief Complaint  Patient presents with   Emesis    [name] is a 76 y.o. male.  76 yo M here with emesis multiple times today. Nonbilious and nonbloody. Resolved after zofran here. Had some diarrhea as well. Some abdominal pain. No fevers. No ho same. No sick contacts. No suspicious foods.    Emesis      Home Medications Prior to Admission medications   Medication Sig Start Date End Date Taking? Authorizing Provider  ondansetron (ZOFRAN) 8 MG tablet Take 1 tablet (8 mg total) by mouth every 4 (four) hours as needed for nausea. 03/07/22  Yes Shery Wauneka, Corene Cornea, MD  acetaminophen (TYLENOL) 500 MG tablet Take 500 mg by mouth every 6 (six) hours as needed for moderate pain.    [provider]  albuterol (VENTOLIN HFA) 108 (90 Base) MCG/ACT inhaler Inhale 1-2 puffs into the lungs every 6 (six) hours as needed for wheezing or shortness of breath.    [provider]  allopurinol (ZYLOPRIM) 300 MG tablet Take 300 mg by mouth Daily.  11/07/11   [provider]  amLODipine (NORVASC) 10 MG tablet Take 10 mg by mouth Daily.  11/07/11   [provider]  Apple Cider Vinegar 300 MG TABS Take 2 tablets by mouth daily.    [provider]  aspirin EC 81 MG EC tablet Take 1 tablet (81 mg total) by mouth daily. 02/10/15   Jacolyn Reedy, MD  atorvastatin (LIPITOR) 40 MG tablet TAKE ONE TABLET BY MOUTH ONCE A WEEK 02/04/21   Kroeger, Daleen Snook M., PA-C  diphenhydrAMINE (BENADRYL) 25 MG tablet Take 50 mg by mouth 2 (two) times daily as needed for allergies.    [provider]  ENTRESTO 24-26 MG TAKE ONE TABLET BY MOUTH TWICE DAILY 01/08/21   Hilty, Nadean Corwin, MD  furosemide (LASIX) 40 MG tablet TAKE ONE TABLET BY MOUTH TWICE DAILY. Needs appointment for further refills 01/20/22   Pixie Casino, MD  pantoprazole  (PROTONIX) 40 MG tablet Take 40 mg by mouth daily. 06/07/18   [provider]  potassium chloride SA (KLOR-CON M) 20 MEQ tablet TAKE ONE TABLET BY MOUTH TWICE DAILY 01/20/22   Hilty, Nadean Corwin, MD  spironolactone (ALDACTONE) 25 MG tablet TAKE ONE TABLET BY MOUTH ONCE DAILY. Needs appointment for further refills 01/20/22   Pixie Casino, MD      Allergies    Prednisone and Atorvastatin    Review of Systems   Review of Systems  Gastrointestinal:  Positive for vomiting.    Physical Exam Updated Vital Signs BP 103/62 (BP Location: Left Arm)   Pulse (!) 59   Temp 98.4 F (36.9 C) (Oral)   Resp 18   SpO2 99%  Physical Exam Vitals and nursing note reviewed.  Constitutional:      Appearance: He is well-developed.  HENT:     Head: Normocephalic and atraumatic.     Mouth/Throat:     Mouth: Mucous membranes are moist.  Eyes:     Pupils: Pupils are equal, round, and reactive to light.  Cardiovascular:     Rate and Rhythm: Normal rate.  Pulmonary:     Effort: Pulmonary effort is normal. No respiratory distress.  Abdominal:     General: Abdomen is flat. There is no distension.     Tenderness: There is abdominal tenderness (  diffusely, no rebound or guarding).  Musculoskeletal:        General: Normal range of motion.     Cervical back: Normal range of motion.  Neurological:     Mental Status: He is alert.     ED Results / Procedures / Treatments   Labs (all labs ordered are listed, but only abnormal results are displayed) Labs Reviewed  CBC WITH DIFFERENTIAL/PLATELET - Abnormal; Notable for the following components:      Result Value   WBC 17.0 (*)    Neutro Abs 14.4 (*)    Monocytes Absolute 1.6 (*)    All other components within normal limits  COMPREHENSIVE METABOLIC PANEL - Abnormal; Notable for the following components:   CO2 21 (*)    Glucose, Bld 139 (*)    BUN 30 (*)    Creatinine, Ser 1.50 (*)    GFR, Estimated 48 (*)    All other components within normal  limits  SARS CORONAVIRUS 2 BY RT PCR    EKG None  Radiology CT ABDOMEN PELVIS W CONTRAST  Result Date: 03/07/2022 CLINICAL DATA:  Nausea, dry heaves, chills, coughing and shortness of breath. Additional history states blunt abdominal trauma. EXAM: CT ABDOMEN AND PELVIS WITH CONTRAST TECHNIQUE: Multidetector CT imaging of the abdomen and pelvis was performed using the standard protocol following bolus administration of intravenous contrast. RADIATION DOSE REDUCTION: This exam was performed according to the departmental dose-optimization program which includes automated exposure control, adjustment of the mA and/or kV according to patient size and/or use of iterative reconstruction technique. CONTRAST:  41m OMNIPAQUE IOHEXOL 350 MG/ML SOLN COMPARISON:  CT with IV contrast 12/29/2018, CT without contrast 05/02/2010 FINDINGS: Lower chest: Scattered linear scar-like opacities both lower lobes mild dependent atelectasis. The heart is slightly enlarged. There is calcification in the right coronary artery and a leadless pacemaker in the right ventricular apex. Hepatobiliary: No focal liver abnormality is seen. Status post cholecystectomy. No biliary dilatation. Pancreas: No focal abnormality. Spleen: No focal abnormality.  No splenomegaly. Adrenals/Urinary Tract: Stable 1.5 cm left adrenal adenoma. No right adrenal mass. Posteriorly in the right lower renal pole there is a homogeneous thin walled 1.5 cm cyst measuring 17.3 Hounsfield units, which was previously about 6 mm.No follow-up imaging is recommended. For reference see JACR 2018 Feb; 264-273, Management of the Incidental Renal mass on CT, [employer] 2021; 814-848, Bosniak Classification of Cystic Renal Masses, Version 2019. Both kidneys are otherwise unremarkable. There is no urinary stone or obstruction. There is symmetric excretion on delayed images. There is no bladder thickening. Stomach/Bowel: The stomach is contracted. There are mildly dilated  left abdominal small bowel segments, mid to lower abdomen up to 2.8 cm caliber without visible transition. Rest of the small bowel is otherwise normal caliber and there are no inflammatory changes. 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 There are diffuse diverticula along the left-sided colon without evidence of diverticulitis. Vascular/Lymphatic: Aortic atherosclerosis. No enlarged abdominal or pelvic lymph nodes. Reproductive: Mild prostatomegaly, prostate 4.6 cm transverse. Other: There are small umbilical and inguinal fat hernias. There is no free air, free hemorrhage or free fluid and no acute inflammatory change. Musculoskeletal: Solid fusion L4-5 with posterior fusion hardware again noted. Osteopenia and degenerative change of the thoracic and lumbar spine. There is mild hip DJD. Mild lumbar dextroscoliosis. Ankylosis left SI joint. IMPRESSION: 1. Mild dilatation of the left mid to lower abdominal small bowel up to 2.8 cm. No visible transition. This could be due to a  regional ileus or due to low-grade partial small bowel obstruction such as due to occult adhesions or occult internal hernia. There are no acute inflammatory changes or mesenteric congestive features. 2. Diverticulosis without evidence of diverticulitis. 3. Prostatomegaly. 4. Umbilical and inguinal fat hernias. 5. Additional chronic findings discussed above. Electronically Signed   By: Telford Nab M.D.   On: 03/07/2022 03:19   DG Chest 2 View  Result Date: 03/06/2022 CLINICAL DATA:  Cough and shortness of breath EXAM: CHEST - 2 VIEW COMPARISON:  07/15/2020 FINDINGS: Cardiac shadow is within normal limits. Aortic calcifications are seen. The lungs are well aerated bilaterally. No focal infiltrate or effusion is seen. Lead less pacemaker is noted. No bony abnormality is noted. IMPRESSION: No acute abnormality noted. Electronically Signed   By: Inez Catalina M.D.   On: 03/06/2022 21:28    Procedures Procedures     Medications Ordered in ED Medications  ondansetron (ZOFRAN-ODT) disintegrating tablet 4 mg (4 mg Oral Given 03/06/22 2117)  lactated ringers bolus 500 mL (0 mLs Intravenous Stopped 03/07/22 0304)  iohexol (OMNIPAQUE) 350 MG/ML injection 80 mL (80 mLs Intravenous Contrast Given 03/07/22 0234)    ED Course/ Medical Decision Making/ A&P                           Medical Decision Making Amount and/or Complexity of Data Reviewed Radiology: ordered.  Risk Prescription drug management.   Ct done to rule out surgical abdomen and relatively normal. Suspect ileus as he had no further vomiting and tolerated PO over multiple hours. Abdomen no longer tender. D/c on zofran with pcp follow up as needed.   Final Clinical Impression(s) / ED Diagnoses Final diagnoses:  Nausea and vomiting, unspecified vomiting type    Rx / DC Orders ED Discharge Orders          Ordered    ondansetron (ZOFRAN) 8 MG tablet  Every 4 hours PRN        03/07/22 0607              Blanchie Zeleznik, Corene Cornea, MD 03/07/22 [phone]

## 2022-04-02 ENCOUNTER — Ambulatory Visit (INDEPENDENT_AMBULATORY_CARE_PROVIDER_SITE_OTHER): Payer: Medicare HMO

## 2022-04-02 DIAGNOSIS — I441 Atrioventricular block, second degree: Secondary | ICD-10-CM

## 2022-04-02 LAB — CUP PACEART REMOTE DEVICE CHECK
Battery Remaining Longevity: 89 mo
Battery Voltage: 2.99 V
Brady Statistic AS VP Percent: 75.75 %
Brady Statistic AS VS Percent: 0.06 %
Brady Statistic RV Percent Paced: 98.49 %
Date Time Interrogation Session: 20231018095000
Implantable Pulse Generator Implant Date: 20201201
Lead Channel Impedance Value: 540 Ohm
Lead Channel Pacing Threshold Amplitude: 0.5 V
Lead Channel Pacing Threshold Pulse Width: 0.24 ms
Lead Channel Sensing Intrinsic Amplitude: 23.175 mV
Lead Channel Setting Pacing Amplitude: 1 V
Lead Channel Setting Pacing Pulse Width: 0.24 ms
Lead Channel Setting Sensing Sensitivity: 2 mV

## 2022-04-15 NOTE — Progress Notes (Signed)
Remote pacemaker transmission.   

## 2022-04-17 ENCOUNTER — Other Ambulatory Visit: Payer: Self-pay | Admitting: Internal Medicine

## 2022-04-21 DIAGNOSIS — I10 Essential (primary) hypertension: Secondary | ICD-10-CM | POA: Diagnosis not present

## 2022-04-21 DIAGNOSIS — N1832 Chronic kidney disease, stage 3b: Secondary | ICD-10-CM | POA: Diagnosis not present

## 2022-04-21 DIAGNOSIS — I2510 Atherosclerotic heart disease of native coronary artery without angina pectoris: Secondary | ICD-10-CM | POA: Diagnosis not present

## 2022-04-21 DIAGNOSIS — I5042 Chronic combined systolic (congestive) and diastolic (congestive) heart failure: Secondary | ICD-10-CM | POA: Diagnosis not present

## 2022-04-21 DIAGNOSIS — K219 Gastro-esophageal reflux disease without esophagitis: Secondary | ICD-10-CM | POA: Diagnosis not present

## 2022-04-21 DIAGNOSIS — E782 Mixed hyperlipidemia: Secondary | ICD-10-CM | POA: Diagnosis not present

## 2022-05-27 DIAGNOSIS — N1832 Chronic kidney disease, stage 3b: Secondary | ICD-10-CM | POA: Diagnosis not present

## 2022-05-27 DIAGNOSIS — Z950 Presence of cardiac pacemaker: Secondary | ICD-10-CM | POA: Diagnosis not present

## 2022-05-27 DIAGNOSIS — R7303 Prediabetes: Secondary | ICD-10-CM | POA: Diagnosis not present

## 2022-05-27 DIAGNOSIS — C61 Malignant neoplasm of prostate: Secondary | ICD-10-CM | POA: Diagnosis not present

## 2022-05-27 DIAGNOSIS — I10 Essential (primary) hypertension: Secondary | ICD-10-CM | POA: Diagnosis not present

## 2022-05-27 DIAGNOSIS — I2510 Atherosclerotic heart disease of native coronary artery without angina pectoris: Secondary | ICD-10-CM | POA: Diagnosis not present

## 2022-05-27 DIAGNOSIS — I441 Atrioventricular block, second degree: Secondary | ICD-10-CM | POA: Diagnosis not present

## 2022-05-27 DIAGNOSIS — E782 Mixed hyperlipidemia: Secondary | ICD-10-CM | POA: Diagnosis not present

## 2022-05-27 DIAGNOSIS — I700 Atherosclerosis of aorta: Secondary | ICD-10-CM | POA: Diagnosis not present

## 2022-05-27 DIAGNOSIS — M109 Gout, unspecified: Secondary | ICD-10-CM | POA: Diagnosis not present

## 2022-05-27 DIAGNOSIS — Z6833 Body mass index (BMI) 33.0-33.9, adult: Secondary | ICD-10-CM | POA: Diagnosis not present

## 2022-05-27 DIAGNOSIS — I5042 Chronic combined systolic (congestive) and diastolic (congestive) heart failure: Secondary | ICD-10-CM | POA: Diagnosis not present

## 2022-05-28 DIAGNOSIS — N1832 Chronic kidney disease, stage 3b: Secondary | ICD-10-CM | POA: Diagnosis not present

## 2022-05-28 DIAGNOSIS — I10 Essential (primary) hypertension: Secondary | ICD-10-CM | POA: Diagnosis not present

## 2022-05-28 DIAGNOSIS — I2510 Atherosclerotic heart disease of native coronary artery without angina pectoris: Secondary | ICD-10-CM | POA: Diagnosis not present

## 2022-05-28 DIAGNOSIS — E782 Mixed hyperlipidemia: Secondary | ICD-10-CM | POA: Diagnosis not present

## 2022-05-28 DIAGNOSIS — I5042 Chronic combined systolic (congestive) and diastolic (congestive) heart failure: Secondary | ICD-10-CM | POA: Diagnosis not present

## 2022-07-02 ENCOUNTER — Ambulatory Visit: Payer: Medicare HMO | Attending: Cardiovascular Disease

## 2022-07-02 DIAGNOSIS — I441 Atrioventricular block, second degree: Secondary | ICD-10-CM

## 2022-07-04 LAB — CUP PACEART REMOTE DEVICE CHECK
Battery Remaining Longevity: 87 mo
Battery Voltage: 2.99 V
Brady Statistic AS VP Percent: 74.9 %
Brady Statistic AS VS Percent: 0.05 %
Brady Statistic RV Percent Paced: 98.54 %
Date Time Interrogation Session: 20240119075400
Implantable Pulse Generator Implant Date: 20201201
Lead Channel Impedance Value: 520 Ohm
Lead Channel Pacing Threshold Amplitude: 0.5 V
Lead Channel Pacing Threshold Pulse Width: 0.24 ms
Lead Channel Sensing Intrinsic Amplitude: 18.338 mV
Lead Channel Setting Pacing Amplitude: 1 V
Lead Channel Setting Pacing Pulse Width: 0.24 ms
Lead Channel Setting Sensing Sensitivity: 2 mV

## 2022-07-08 ENCOUNTER — Telehealth: Payer: Self-pay | Admitting: Internal Medicine

## 2022-07-08 MED ORDER — FUROSEMIDE 40 MG PO TABS: 40.0000 mg | ORAL_TABLET | Freq: Two times a day (BID) | ORAL | 0 refills | Status: DC

## 2022-07-08 MED ORDER — SPIRONOLACTONE 25 MG PO TABS: 25.0000 mg | ORAL_TABLET | Freq: Every day | ORAL | 5 refills | Status: DC

## 2022-07-08 NOTE — Telephone Encounter (Signed)
Pt c/o swelling: STAT is pt has developed SOB within 24 hours  If swelling, where is the swelling located?   Both ankles  How much weight have you gained and in what time span? 7 lbs  Have you gained 3 pounds in a day or 5 pounds in a week? Unknown  Do you have a log of your daily weights (if so, list)?   Unknown  Are you currently taking a fluid pill?   Yes  Are you currently SOB?   No  Have you traveled recently?   Unknown   Caller wanted to report patient stated he was having swelling in his ankles and has had a 7 lbs weight gain.

## 2022-07-08 NOTE — Telephone Encounter (Signed)
Attempted to contact patient to discuss leg swelling.   Unable to leave a voicemail, will try again later.

## 2022-07-08 NOTE — Telephone Encounter (Signed)
*  STAT* If patient is at the pharmacy, call can be transferred to refill team.   1. Which medications need to be refilled? (please list name of each medication and dose if known)   furosemide (LASIX) 40 MG tablet  spironolactone (ALDACTONE) 25 MG tablet   2. Which pharmacy/location (including street and city if local pharmacy) is medication to be sent to?  [facility] - [place], [place] - [place] [address]   3. Do they need a 30 day or 90 day supply?   90 day  Caller stated the patient is completely out of these medications.

## 2022-07-10 NOTE — Telephone Encounter (Signed)
Spoke with pt, he had run out of his furosemide and after starting back his weight has gone from 240 lb to 234 lb. He reports he is doing good right now. Overdue follow up appointment made next available with NP.

## 2022-07-21 DIAGNOSIS — I2510 Atherosclerotic heart disease of native coronary artery without angina pectoris: Secondary | ICD-10-CM | POA: Diagnosis not present

## 2022-07-21 DIAGNOSIS — E782 Mixed hyperlipidemia: Secondary | ICD-10-CM | POA: Diagnosis not present

## 2022-07-21 DIAGNOSIS — I10 Essential (primary) hypertension: Secondary | ICD-10-CM | POA: Diagnosis not present

## 2022-07-21 DIAGNOSIS — I5042 Chronic combined systolic (congestive) and diastolic (congestive) heart failure: Secondary | ICD-10-CM | POA: Diagnosis not present

## 2022-07-21 DIAGNOSIS — N1832 Chronic kidney disease, stage 3b: Secondary | ICD-10-CM | POA: Diagnosis not present

## 2022-07-23 DIAGNOSIS — I110 Hypertensive heart disease with heart failure: Secondary | ICD-10-CM | POA: Diagnosis not present

## 2022-07-23 DIAGNOSIS — E669 Obesity, unspecified: Secondary | ICD-10-CM | POA: Diagnosis not present

## 2022-07-23 DIAGNOSIS — E876 Hypokalemia: Secondary | ICD-10-CM | POA: Diagnosis not present

## 2022-07-23 DIAGNOSIS — I509 Heart failure, unspecified: Secondary | ICD-10-CM | POA: Diagnosis not present

## 2022-07-23 DIAGNOSIS — Z008 Encounter for other general examination: Secondary | ICD-10-CM | POA: Diagnosis not present

## 2022-07-23 DIAGNOSIS — K219 Gastro-esophageal reflux disease without esophagitis: Secondary | ICD-10-CM | POA: Diagnosis not present

## 2022-07-23 DIAGNOSIS — I252 Old myocardial infarction: Secondary | ICD-10-CM | POA: Diagnosis not present

## 2022-07-23 DIAGNOSIS — M109 Gout, unspecified: Secondary | ICD-10-CM | POA: Diagnosis not present

## 2022-07-23 DIAGNOSIS — J449 Chronic obstructive pulmonary disease, unspecified: Secondary | ICD-10-CM | POA: Diagnosis not present

## 2022-07-23 DIAGNOSIS — N529 Male erectile dysfunction, unspecified: Secondary | ICD-10-CM | POA: Diagnosis not present

## 2022-07-23 DIAGNOSIS — I2510 Atherosclerotic heart disease of native coronary artery without angina pectoris: Secondary | ICD-10-CM | POA: Diagnosis not present

## 2022-07-23 DIAGNOSIS — K5900 Constipation, unspecified: Secondary | ICD-10-CM | POA: Diagnosis not present

## 2022-07-23 DIAGNOSIS — E785 Hyperlipidemia, unspecified: Secondary | ICD-10-CM | POA: Diagnosis not present

## 2022-07-25 NOTE — Progress Notes (Signed)
Remote pacemaker transmission.   

## 2022-07-29 ENCOUNTER — Ambulatory Visit: Payer: Medicare HMO | Attending: Cardiovascular Disease | Admitting: Cardiovascular Disease

## 2022-07-29 ENCOUNTER — Encounter: Payer: Medicare HMO | Admitting: Cardiovascular Disease

## 2022-07-29 ENCOUNTER — Encounter: Payer: Self-pay | Admitting: Cardiovascular Disease

## 2022-07-29 VITALS — BP 116/62 | HR 60 | Ht 72.0 in | Wt 232.2 lb

## 2022-07-29 DIAGNOSIS — I441 Atrioventricular block, second degree: Secondary | ICD-10-CM

## 2022-07-29 NOTE — Patient Instructions (Signed)
Medication Instructions:  Your physician recommends that you continue on your current medications as directed. Please refer to the Current Medication list given to you today.  *If you need a refill on your cardiac medications before your next appointment, please call your pharmacy*  Lab Work: None ordered  Testing/Procedures: None ordered  Follow-Up: At [facility], you and your health needs are our priority.  As part of our continuing mission to provide you with exceptional heart care, we have created designated Provider Care Teams.  These Care Teams include your primary Cardiologist (physician) and Advanced Practice Providers (APPs -  Physician Assistants and Nurse Practitioners) who all work together to provide you with the care you need, when you need it.   Your next appointment:   1 year(s)  The format for your next appointment:   In Person  Provider:   You will see one of the following Advanced Practice Providers on your designated Care Team:   Tommye Standard, [place]" [place], [place], NP

## 2022-07-29 NOTE — Progress Notes (Signed)
Electrophysiology Office Note:    Date:  07/29/2022   ID:  [name], DOB [birth year], MRN [id number]  PCP:  Maury Dus, MD (Inactive)   [place] Providers Cardiologist:  Pixie Casino, MD Electrophysiologist:  Melida Quitter, MD     Referring MD: No ref. provider found   History of Present Illness:    [name] is a 77 y.o. male with a hx listed below, significant for symptomatic second degree AV block, referred for arrhythmia management.  He has a micra AV placed in December, 2020 by Dr. Rayann Heman.  He has no new complaints -- no chest pain, no syncope, pre-syncope. He does have palpitations from time-to-time. These are not particularly bothersome for him.   Past Medical History:  Diagnosis Date   Arthritis    "left shoulder" (02/09/2015)   Blind left eye    CAD (coronary artery disease)    Closed fracture of lateral portion of right tibial plateau 0000000   Complication of anesthesia    "they give me too much anesthesia & had to put me on life support for a little bit w/gallbladder OR"   Coronary artery disease    Former smoker    GERD (gastroesophageal reflux disease)    Gout    History of bleeding ulcers    History of gout    History of peptic ulcer disease    Hyperlipidemia    Hypertension    Hypertensive heart disease without CHF    Kidney stones    Lumbar disc disease    Myocardial infarction ([place]) 1995; 2007   Obesity    OSA (obstructive sleep apnea)    Pneumonia X 2   Prostate cancer ([place])    Pulmonary nodule    Second degree Mobitz I AV block     Past Surgical History:  Procedure Laterality Date   APPENDECTOMY     APPENDECTOMY  ~ [place] N/A 02/08/2015   Procedure: Left Heart Cath and Coronary Angiography;  Surgeon: Leonie Man, MD;  Location: [facility];  Service: Cardiovascular;  Laterality: N/A;   CARDIAC CATHETERIZATION N/A 02/08/2015   Procedure: Coronary Stent  Intervention;  Surgeon: Leonie Man, MD;  Location: [facility];  Service: Cardiovascular;  Laterality: N/A;   CHOLECYSTECTOMY     CORONARY ANGIOPLASTY WITH STENT PLACEMENT  ~ 1995; 2007   "1 stent; 2 stents"   LAPAROSCOPIC CHOLECYSTECTOMY     LEFT HEART CATH AND CORONARY ANGIOGRAPHY N/A 07/13/2018   Procedure: LEFT HEART CATH AND CORONARY ANGIOGRAPHY;  Surgeon: Martinique, Peter M, MD;  Location: [facility];  Service: Cardiovascular;  Laterality: N/A;   LITHOTRIPSY  X 1   LUMBAR LAMINECTOMY     ORIF TIBIA PLATEAU Right 11/05/2017   Procedure: OPEN REDUCTION INTERNAL FIXATION (ORIF) TIBIAL PLATEAU;  Surgeon: Shona Needles, MD;  Location: [facility];  Service: Orthopedics;  Laterality: Right;   PACEMAKER LEADLESS INSERTION N/A 05/17/2019   Procedure: PACEMAKER LEADLESS INSERTION;  Surgeon: Thompson Grayer, MD;  Location: [facility];  Service: Cardiovascular;  Laterality: N/A;   POSTERIOR LUMBAR FUSION  2003?   PROSTATE BIOPSY  2015    Current Medications: Current Meds  Medication Sig   acetaminophen (TYLENOL) 500 MG tablet Take 500 mg by mouth every 6 (six) hours as needed for moderate pain.   allopurinol (ZYLOPRIM) 300 MG tablet Take 300 mg by mouth Daily.  amLODipine (NORVASC) 10 MG tablet Take 10 mg by mouth Daily.    Apple Cider Vinegar 300 MG TABS Take 2 tablets by mouth daily.   aspirin EC 81 MG EC tablet Take 1 tablet (81 mg total) by mouth daily.   atorvastatin (LIPITOR) 40 MG tablet TAKE ONE TABLET BY MOUTH ONCE A WEEK   diphenhydrAMINE (BENADRYL) 25 MG tablet Take 50 mg by mouth 2 (two) times daily as needed for allergies.   furosemide (LASIX) 40 MG tablet Take 1 tablet (40 mg total) by mouth 2 (two) times daily. PATIENT MUST KEEP SCHEDULED APPOINTMENT FOR FUTURE REFILLS   pantoprazole (PROTONIX) 40 MG tablet Take 40 mg by mouth daily.   potassium chloride SA (KLOR-CON M) 20 MEQ tablet TAKE ONE TABLET BY MOUTH TWICE DAILY   spironolactone (ALDACTONE) 25 MG  tablet Take 1 tablet (25 mg total) by mouth daily.     Allergies:   Prednisone and Atorvastatin   Social History   Socioeconomic History   Marital status: Married    Spouse name: Not on file   Number of children: 2   Years of education: Not on file   Highest education level: Not on file  Occupational History   Occupation: retired  Tobacco Use   Smoking status: Former    Packs/day: 3.00    Years: 35.00    Total pack years: 105.00    Types: Cigarettes    Quit date: 06/17/1983    Years since quitting: 39.1   Smokeless tobacco: Never   Tobacco comments:    "quit smoking cigarettes in 1985  Substance and Sexual Activity   Alcohol use: Yes    Alcohol/week: 0.0 standard drinks of alcohol    Comment: "I drank a whole lot when I was younger; nothing since 1990"   Drug use: No   Sexual activity: Never  Other Topics Concern   Not on file  Social History Narrative   ** Merged History Encounter **       Social Determinants of Health   Financial Resource Strain: Not on file  Food Insecurity: Not on file  Transportation Needs: Not on file  Physical Activity: Not on file  Stress: Not on file  Social Connections: Not on file     Family History: The patient's family history includes Aneurysm in his father; COPD in his sister; Coronary artery disease in his mother; Hypertension in his father and mother; Sleep apnea in an other family member.  ROS:   Please see the history of present illness.    All other systems reviewed and are negative.  EKGs/Labs/Other Studies Reviewed Today:     TTE 06/27/2020 EF 45-50%  EKG:  Last EKG results: today -- A-sensed, V-paced   Recent Labs: 03/06/2022: ALT 15; BUN 30; Creatinine, Ser 1.50; Hemoglobin 14.6; Platelets 241; Potassium 4.3; Sodium 138     Physical Exam:    VS:  BP 116/62   Pulse 60   Ht 6' (1.829 m)   Wt 232 lb 3.2 oz (105.3 kg)   SpO2 97%   BMI 31.49 kg/m     Wt Readings from Last 3 Encounters:  07/29/22 232 lb 3.2  oz (105.3 kg)  11/04/20 298 lb 1 oz (135.2 kg)  07/25/20 298 lb (135.2 kg)     GEN:  Well nourished, well developed in no acute distress CARDIAC: RRR, no murmurs, rubs, gallops RESPIRATORY:  Normal work of breathing MUSCULOSKELETAL: no edema    ASSESSMENT & PLAN:    [employer]  AV in place: normal function. Rate smoothing on to  PVCs: not detected on ECG, but rate smoothing events appear to be related to PVCs Second degree AV block, type I CAD:  CHFrEF 45-50%: repeat TTE prior to next follow-up. No new symptoms to suggest EF has decreased with RV pacing.        Medication Adjustments/Labs and Tests Ordered: Current medicines are reviewed at length with the patient today.  Concerns regarding medicines are outlined above.  Orders Placed This Encounter  Procedures   EKG 12-Lead   No orders of the defined types were placed in this encounter.    Signed, Melida Quitter, MD  07/29/2022 9:50 AM    [place]

## 2022-08-06 NOTE — Progress Notes (Signed)
Cardiology Clinic Note   Patient Name: [name] Date of Encounter: 08/08/2022  Primary Care Provider:  Maury Dus, MD (Inactive) Primary Cardiologist:  Pixie Casino, MD  Patient Profile    [name] 77 year old male presents the clinic today for follow-up evaluation of his coronary artery disease and hypertension.  Past Medical History    Past Medical History:  Diagnosis Date   Arthritis    "left shoulder" (02/09/2015)   Blind left eye    CAD (coronary artery disease)    Closed fracture of lateral portion of right tibial plateau 0000000   Complication of anesthesia    "they give me too much anesthesia & had to put me on life support for a little bit w/gallbladder OR"   Coronary artery disease    Former smoker    GERD (gastroesophageal reflux disease)    Gout    History of bleeding ulcers    History of gout    History of peptic ulcer disease    Hyperlipidemia    Hypertension    Hypertensive heart disease without CHF    Kidney stones    Lumbar disc disease    Myocardial infarction ([place]) 1995; 2007   Obesity    OSA (obstructive sleep apnea)    Pneumonia X 2   Prostate cancer ([place])    Pulmonary nodule    Second degree Mobitz I AV block    Past Surgical History:  Procedure Laterality Date   APPENDECTOMY     APPENDECTOMY  ~ [place] N/A 02/08/2015   Procedure: Left Heart Cath and Coronary Angiography;  Surgeon: Leonie Man, MD;  Location: [facility];  Service: Cardiovascular;  Laterality: N/A;   CARDIAC CATHETERIZATION N/A 02/08/2015   Procedure: Coronary Stent Intervention;  Surgeon: Leonie Man, MD;  Location: [facility];  Service: Cardiovascular;  Laterality: N/A;   CHOLECYSTECTOMY     CORONARY ANGIOPLASTY WITH STENT PLACEMENT  ~ 1995; 2007   "1 stent; 2 stents"   LAPAROSCOPIC CHOLECYSTECTOMY     LEFT HEART CATH AND CORONARY ANGIOGRAPHY N/A 07/13/2018   Procedure: LEFT HEART CATH  AND CORONARY ANGIOGRAPHY;  Surgeon: Martinique, Peter M, MD;  Location: [facility];  Service: Cardiovascular;  Laterality: N/A;   LITHOTRIPSY  X 1   LUMBAR LAMINECTOMY     ORIF TIBIA PLATEAU Right 11/05/2017   Procedure: OPEN REDUCTION INTERNAL FIXATION (ORIF) TIBIAL PLATEAU;  Surgeon: Shona Needles, MD;  Location: [facility];  Service: Orthopedics;  Laterality: Right;   PACEMAKER LEADLESS INSERTION N/A 05/17/2019   Procedure: PACEMAKER LEADLESS INSERTION;  Surgeon: Thompson Grayer, MD;  Location: [facility];  Service: Cardiovascular;  Laterality: N/A;   POSTERIOR LUMBAR FUSION  2003?   PROSTATE BIOPSY  2015    Allergies  Allergies  Allergen Reactions   Prednisone Swelling    Other reaction(s): itching/swelling   Atorvastatin     Other reaction(s): leg cramps Other reaction(s): leg cramps Other reaction(s): leg cramps    History of Present Illness    [name] is a PMH of HTN, HLD, CAD, second-degree AV heart block, OSA on BiPAP, pulmonary nodule, asthma, chronic respiratory failure, GERD, obesity, and generalized abdominal pain, and PPM (1/20).  He underwent cardiac catheterization in 1995, 2007, 2016, and 07/13/2018.  Last cardiac catheterization showed distal RCA 30%, proximal LAD 35%, previously placed proximal circumflex stent widely patent, previously placed mid  circumflex stent widely patent, distal circumflex lesion 100% with sidebranch and ostial third marginal, LV systolic function normal, medical management was recommended.  His procedure was complicated by wound Kien[place]ck.  He was evaluated by EP and felt not to be a candidate for pacing.  He developed progressive fatigue and worsening symptoms.  He underwent leadless pacemaker placement without complication and noted improvement in his symptoms.  He was seen in follow-up by Dr. Debara Pickett on 07/25/2020.  He was doing well at that time.  He was compliant with his BiPAP.  He had been hospitalized in January with acute on  chronic CHF.  At that time his LVEF was noted to be 45-50%.  He was placed on furosemide, Aldactone, and Entresto.  He contracted COVID-19.  He was treated with monoclonal antibody therapy 07/17/2020.  He reported he was asymptomatic.  He was planning to go back to work.  His blood pressure was well-controlled.  He was seen in follow-up by Dr. Myles Gip on 07/29/2022.  At that time he had no new complaints.  He denied chest pain syncope and presyncope.  He did note occasional episodes of palpitations.  They were not bothersome.  His pacemaker was functioning normally.  He presents to the clinic today for follow-up evaluation and states he feels well today.  He is walked from the parking lot, through the office and to the exam room and denies chest discomfort.  He does note that he is working 11 days/week for [employer].  He notes that when he rushes at work he notices a burning type sensation which she reports feels like heartburn.  The sensation goes away when he slows his effort.  The sensation will last 2 to 3 minutes.  We reviewed his previous cardiac catheterizations and he denies anginal equivalent symptoms.  He also notes that someone called him and instructed him to stop taking his Entresto.  I see no note of this from our office.  I will refill his nitroglycerin, restart his Entresto, order BMP in 1 to 2 weeks, give him a salty 6 diet sheet and plan follow-up in 12 months.  Today he denies chest pain, shortness of breath, lower extremity edema, fatigue, palpitations, melena, hematuria, hemoptysis, diaphoresis, weakness, presyncope, syncope, orthopnea, and PND.   Home Medications    Prior to Admission medications   Medication Sig Start Date End Date Taking? Authorizing Provider  acetaminophen (TYLENOL) 500 MG tablet Take 500 mg by mouth every 6 (six) hours as needed for moderate pain.    [provider]  albuterol (VENTOLIN HFA) 108 (90 Base) MCG/ACT inhaler Inhale  1-2 puffs into the lungs every 6 (six) hours as needed for wheezing or shortness of breath. Patient not taking: Reported on 07/29/2022    [provider]  allopurinol (ZYLOPRIM) 300 MG tablet Take 300 mg by mouth Daily.  11/07/11   [provider]  amLODipine (NORVASC) 10 MG tablet Take 10 mg by mouth Daily.  11/07/11   [provider]  Apple Cider Vinegar 300 MG TABS Take 2 tablets by mouth daily.    [provider]  aspirin EC 81 MG EC tablet Take 1 tablet (81 mg total) by mouth daily. 02/10/15   Jacolyn Reedy, MD  atorvastatin (LIPITOR) 40 MG tablet TAKE ONE TABLET BY MOUTH ONCE A WEEK 02/04/21   Kroeger, Daleen Snook M., PA-C  diphenhydrAMINE (BENADRYL) 25 MG tablet Take 50 mg by mouth 2 (two) times daily as needed for allergies.  [provider]  ENTRESTO 24-26 MG TAKE ONE TABLET BY MOUTH TWICE DAILY Patient not taking: Reported on 07/29/2022 01/08/21   Pixie Casino, MD  furosemide (LASIX) 40 MG tablet Take 1 tablet (40 mg total) by mouth 2 (two) times daily. PATIENT MUST KEEP SCHEDULED APPOINTMENT FOR FUTURE REFILLS 07/08/22   Pixie Casino, MD  ondansetron (ZOFRAN) 8 MG tablet Take 1 tablet (8 mg total) by mouth every 4 (four) hours as needed for nausea. Patient not taking: Reported on 07/29/2022 03/07/22   Mesner, Corene Cornea, MD  pantoprazole (PROTONIX) 40 MG tablet Take 40 mg by mouth daily. 06/07/18   [provider]  potassium chloride SA (KLOR-CON M) 20 MEQ tablet TAKE ONE TABLET BY MOUTH TWICE DAILY 01/20/22   Hilty, Nadean Corwin, MD  spironolactone (ALDACTONE) 25 MG tablet Take 1 tablet (25 mg total) by mouth daily. 07/08/22   Pixie Casino, MD    Family History    Family History  Problem Relation Age of Onset   Hypertension Mother    Coronary artery disease Mother    Aneurysm Father    Hypertension Father    COPD Sister    Sleep apnea Other        3 siblings   He indicated that his mother is deceased. He indicated that his father  is deceased. He indicated that the status of his sister is unknown. He indicated that his brother is deceased. He indicated that the status of his other is unknown.  Social History    Social History   Socioeconomic History   Marital status: Married    Spouse name: Not on file   Number of children: 2   Years of education: Not on file   Highest education level: Not on file  Occupational History   Occupation: retired  Tobacco Use   Smoking status: Former    Packs/day: 3.00    Years: 35.00    Total pack years: 105.00    Types: Cigarettes    Quit date: 06/17/1983    Years since quitting: 39.1   Smokeless tobacco: Never   Tobacco comments:    "quit smoking cigarettes in 1985  Substance and Sexual Activity   Alcohol use: Yes    Alcohol/week: 0.0 standard drinks of alcohol    Comment: "I drank a whole lot when I was younger; nothing since 1990"   Drug use: No   Sexual activity: Never  Other Topics Concern   Not on file  Social History Narrative   ** Merged History Encounter **       Social Determinants of Health   Financial Resource Strain: Not on file  Food Insecurity: Not on file  Transportation Needs: Not on file  Physical Activity: Not on file  Stress: Not on file  Social Connections: Not on file  Intimate Partner Violence: Not on file     Review of Systems    General:  No chills, fever, night sweats or weight changes.  Cardiovascular:  No chest pain, dyspnea on exertion, edema, orthopnea, palpitations, paroxysmal nocturnal dyspnea. Dermatological: No rash, lesions/masses Respiratory: No cough, dyspnea Urologic: No hematuria, dysuria Abdominal:   No nausea, vomiting, diarrhea, bright red blood per rectum, melena, or hematemesis Neurologic:  No visual changes, wkns, changes in mental status. All other systems reviewed and are otherwise negative except as noted above.  Physical Exam    VS:  BP 128/72 (BP Location: Left Arm, Patient Position: Sitting, Cuff Size:  Large)   Pulse Marland Kitchen)  57   Ht '6\' 2"'$  (1.88 m)   Wt 234 lb 6.4 oz (106.3 kg)   SpO2 96%   BMI 30.10 kg/m  , BMI Body mass index is 30.1 kg/m. GEN: Well nourished, well developed, in no acute distress. HEENT: normal. Neck: Supple, no JVD, carotid bruits, or masses. Cardiac: RRR, no murmurs, rubs, or gallops. No clubbing, cyanosis, edema.  Radials/DP/PT 2+ and equal bilaterally.  Respiratory:  Respirations regular and unlabored, clear to auscultation bilaterally. GI: Soft, nontender, nondistended, BS + x 4. MS: no deformity or atrophy. Skin: warm and dry, no rash. Neuro:  Strength and sensation are intact. Psych: Normal affect.  Accessory Clinical Findings    Recent Labs: 03/06/2022: ALT 15; BUN 30; Creatinine, Ser 1.50; Hemoglobin 14.6; Platelets 241; Potassium 4.3; Sodium 138   Recent Lipid Panel    Component Value Date/Time   CHOL 154 06/27/2020 0500   TRIG 204 (H) 06/27/2020 0500   HDL 30 (L) 06/27/2020 0500   CHOLHDL 5.1 06/27/2020 0500   VLDL 41 (H) 06/27/2020 0500   LDLCALC 83 06/27/2020 0500         ECG personally reviewed by me today-none today.  Echocardiogram 10/14/2021  IMPRESSIONS     1. Left ventricular ejection fraction, by estimation, is 45 to 50%. The  left ventricle has mildly decreased function. The left ventricle  demonstrates regional wall motion abnormalities (apical hypokinesis,  paradoxical septal motion). There is moderate  concentric left ventricular hypertrophy. Left ventricular diastolic  parameters are consistent with Grade I diastolic dysfunction (impaired  relaxation).   2. Right ventricular systolic function is normal. The right ventricular  size is dilated. There is normal pulmonary artery systolic pressure. The  estimated right ventricular systolic pressure is [id number] mmHg.   3. Left atrial size was moderately dilated.   4. The mitral valve is normal in structure. Mild mitral valve  regurgitation. No evidence of mitral stenosis.   5. The  aortic valve is tricuspid. Aortic valve regurgitation is not  visualized. No aortic stenosis is present.   6. The inferior vena cava is normal in size with greater than 50%  respiratory variability, suggesting right atrial pressure of 3 mmHg.   Comparison(s): No significant change from prior study.   FINDINGS   Left Ventricle: Left ventricular ejection fraction, by estimation, is 45  to 50%. The left ventricle has mildly decreased function. The left  ventricle demonstrates regional wall motion abnormalities. The left  ventricular internal cavity size was normal  in size. There is moderate concentric left ventricular hypertrophy.  Abnormal (paradoxical) septal motion, consistent with left bundle branch  block. Left ventricular diastolic parameters are consistent with Grade I  diastolic dysfunction (impaired  relaxation).     LV Wall Scoring:  The entire anterior septum, apical lateral segment, apical anterior  segment,  and apical inferior segment are hypokinetic.   Right Ventricle: The right ventricular size is moderately enlarged. No  increase in right ventricular wall thickness. Right ventricular systolic  function is normal. There is normal pulmonary artery systolic pressure.  The tricuspid regurgitant velocity is  2.68 m/s, and with an assumed right atrial pressure of 3 mmHg, the  estimated right ventricular systolic pressure is [id number] mmHg.   Left Atrium: Left atrial size was moderately dilated.   Right Atrium: Right atrial size was normal in size.   Pericardium: Trivial pericardial effusion is present. Presence of  epicardial fat layer.   Mitral Valve: The mitral valve is normal in structure. Mild mitral  valve  regurgitation. No evidence of mitral valve stenosis.   Tricuspid Valve: The tricuspid valve is normal in structure. Tricuspid  valve regurgitation is mild . No evidence of tricuspid stenosis.   Aortic Valve: The aortic valve is tricuspid. There is mild aortic  valve  annular calcification. Aortic valve regurgitation is not visualized. No  aortic stenosis is present.   Pulmonic Valve: The pulmonic valve was normal in structure. Pulmonic valve  regurgitation is not visualized. No evidence of pulmonic stenosis.   Aorta: The aortic root and ascending aorta are structurally normal, with  no evidence of dilitation.   Venous: The inferior vena cava is normal in size with greater than 50%  respiratory variability, suggesting right atrial pressure of 3 mmHg.   IAS/Shunts: No atrial level shunt detected by color flow Doppler.    Cardiac catheterization 07/13/18 Previously placed Mid RCA stent (unknown type) is widely patent. Dist RCA lesion is 30% stenosed. Prox LAD lesion is 35% stenosed. Previously placed Prox Cx stent (unknown type) is widely patent. Previously placed Mid Cx stent (unknown type) is widely patent. Dist Cx lesion is 100% stenosed with 100% stenosed side branch in Ost 3rd Mrg. The left ventricular systolic function is normal. LV end diastolic pressure is mildly elevated. The left ventricular ejection fraction is 55-65% by visual estimate.   1. Single vessel occlusive CAD involving the distal LCx. 2. Patent stents in the LAD, RCA and LCx 3. Normal LV function 4. Mildly elevated LVEDP   Plan: medical management.   Diagnostic Dominance: Right  Intervention    Assessment & Plan   1.  Coronary artery disease-denies chest pain today. 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  He reports that he does have some heartburn type sensation when he is rushing around.  As soon as he reduces his physical exertion his symptoms dissipate.  He has not used any sublingual nitroglycerin.  We reviewed his prior anginal symptoms and they do not appear to be consistent. Maintains baseline physical activity.  Underwent cardiac catheterization 1/20.  Medical management was  recommended.  Details above. Continue current medical therapy Refill nitroglycerin  Chronic HFrEF-no increased DOE or activity intolerance.  NYHA class I-2.  Echocardiogram showed an EF of 45-50%.  Euvolemic.  Weight stable. Continue Entresto, furosemide, spironolactone, potassium Restart entresto- some confusion per patient reporting someone has called him and told him to stop taking the medication.  Reports this happened 6 months ago.  No record of anyone from our office instructed the patient to stop his Entresto. Heart healthy low-sodium diet-salty 6 given Increase physical activity as tolerated Order  BMP in 1-2 weeks  Hyperlipidemia-LDL 102 on 05/27/22. Heart healthy low-sodium high-fiber diet Continue atorvastatin Follows with PCP  Second-degree AV block-seen and evaluated by EP on 07/29/2022.  Device functioning normally.  Device inserted 2020 by Dr. Rayann Heman. Follows with EP  Essential hypertension-BP today 128/72 Increase physical activity as tolerated Heart healthy low-sodium diet Continue amlodipine, spironolactone, Entresto, furosemide   Disposition: Follow-up with Dr. Debara Pickett or me in 12 months.   Jossie Ng. Lakeysha Slutsky NP-C     08/08/2022, 8:12 AM [place] [address] Office [phone] Fax [phone]    I spent 14 minutes examining this patient, reviewing medications, and using patient centered shared decision making involving her cardiac care.  Prior to her visit I spent greater than 20 minutes reviewing her past medical history,  medications, and prior cardiac tests.

## 2022-08-08 ENCOUNTER — Ambulatory Visit: Payer: Medicare HMO | Attending: General Practice | Admitting: General Practice

## 2022-08-08 ENCOUNTER — Encounter: Payer: Self-pay | Admitting: General Practice

## 2022-08-08 VITALS — BP 128/72 | HR 57 | Ht 74.0 in | Wt 234.4 lb

## 2022-08-08 DIAGNOSIS — I10 Essential (primary) hypertension: Secondary | ICD-10-CM

## 2022-08-08 DIAGNOSIS — E782 Mixed hyperlipidemia: Secondary | ICD-10-CM | POA: Diagnosis not present

## 2022-08-08 DIAGNOSIS — I5042 Chronic combined systolic (congestive) and diastolic (congestive) heart failure: Secondary | ICD-10-CM | POA: Diagnosis not present

## 2022-08-08 DIAGNOSIS — I2510 Atherosclerotic heart disease of native coronary artery without angina pectoris: Secondary | ICD-10-CM

## 2022-08-08 DIAGNOSIS — I441 Atrioventricular block, second degree: Secondary | ICD-10-CM | POA: Diagnosis not present

## 2022-08-08 MED ORDER — NITROGLYCERIN 0.4 MG SL SUBL: 0.4000 mg | SUBLINGUAL_TABLET | SUBLINGUAL | 3 refills | Status: AC | PRN

## 2022-08-08 MED ORDER — ENTRESTO 24-26 MG PO TABS: 1.0000 | ORAL_TABLET | Freq: Two times a day (BID) | ORAL | 5 refills | Status: DC

## 2022-08-08 NOTE — Patient Instructions (Signed)
Medication Instructions:  START ENTRESTO 24/'26MG'$  DAILY *If you need a refill on your cardiac medications before your next appointment, please call your pharmacy*  Lab Work: BMET IN 2 WEEKS If you have labs (blood work) drawn today and your tests are completely normal, you will receive your results only by: [place] (if you have MyChart) OR A paper copy in the mail If you have any lab test that is abnormal or we need to change your treatment, we will call you to review the results.   Testing/Procedures: NONE  Follow-Up: At [facility], you and your health needs are our priority.  As part of our continuing mission to provide you with exceptional heart care, we have created designated Provider Care Teams.  These Care Teams include your primary Cardiologist (physician) and Advanced Practice Providers (APPs -  Physician Assistants and Nurse Practitioners) who all work together to provide you with the care you need, when you need it.  Your next appointment:   12 month(s)  Provider:   Pixie Casino, MD     Other Instructions CONTINUE PHYSICAL ACTIVITY  PLEASE READ AND FOLLOW ATTACHED  SALTY 6

## 2022-08-21 ENCOUNTER — Telehealth: Payer: Self-pay | Admitting: Internal Medicine

## 2022-08-21 ENCOUNTER — Other Ambulatory Visit: Payer: Self-pay | Admitting: Internal Medicine

## 2022-08-21 DIAGNOSIS — E782 Mixed hyperlipidemia: Secondary | ICD-10-CM | POA: Diagnosis not present

## 2022-08-21 DIAGNOSIS — I5042 Chronic combined systolic (congestive) and diastolic (congestive) heart failure: Secondary | ICD-10-CM | POA: Diagnosis not present

## 2022-08-21 DIAGNOSIS — I10 Essential (primary) hypertension: Secondary | ICD-10-CM | POA: Diagnosis not present

## 2022-08-21 DIAGNOSIS — N1832 Chronic kidney disease, stage 3b: Secondary | ICD-10-CM | POA: Diagnosis not present

## 2022-08-21 DIAGNOSIS — I2510 Atherosclerotic heart disease of native coronary artery without angina pectoris: Secondary | ICD-10-CM | POA: Diagnosis not present

## 2022-08-21 NOTE — Telephone Encounter (Signed)
Pt c/o medication issue:  1. Name of Medication: furosemide (LASIX) 40 MG tablet   2. How are you currently taking this medication (dosage and times per day)? Take 1 tablet (40 mg total) by mouth 2 (two) times daily.   3. Are you having a reaction (difficulty breathing--STAT)? No    4. What is your medication issue? Send in new 3 mon prescription into  [facility] - [place], [place] - [address]

## 2022-08-21 NOTE — Telephone Encounter (Signed)
Refill sent to pharmacy.   

## 2022-09-18 DIAGNOSIS — N1832 Chronic kidney disease, stage 3b: Secondary | ICD-10-CM | POA: Diagnosis not present

## 2022-09-18 DIAGNOSIS — E782 Mixed hyperlipidemia: Secondary | ICD-10-CM | POA: Diagnosis not present

## 2022-09-18 DIAGNOSIS — I2510 Atherosclerotic heart disease of native coronary artery without angina pectoris: Secondary | ICD-10-CM | POA: Diagnosis not present

## 2022-09-18 DIAGNOSIS — I10 Essential (primary) hypertension: Secondary | ICD-10-CM | POA: Diagnosis not present

## 2022-09-18 DIAGNOSIS — I5042 Chronic combined systolic (congestive) and diastolic (congestive) heart failure: Secondary | ICD-10-CM | POA: Diagnosis not present

## 2022-10-01 ENCOUNTER — Ambulatory Visit: Payer: Medicare HMO

## 2022-10-06 DIAGNOSIS — H4720 Unspecified optic atrophy: Secondary | ICD-10-CM | POA: Diagnosis not present

## 2022-10-06 DIAGNOSIS — H4020X3 Unspecified primary angle-closure glaucoma, severe stage: Secondary | ICD-10-CM | POA: Diagnosis not present

## 2022-10-06 DIAGNOSIS — H524 Presbyopia: Secondary | ICD-10-CM | POA: Diagnosis not present

## 2022-10-06 DIAGNOSIS — H25813 Combined forms of age-related cataract, bilateral: Secondary | ICD-10-CM | POA: Diagnosis not present

## 2022-10-06 DIAGNOSIS — H40033 Anatomical narrow angle, bilateral: Secondary | ICD-10-CM | POA: Diagnosis not present

## 2022-10-06 DIAGNOSIS — H52221 Regular astigmatism, right eye: Secondary | ICD-10-CM | POA: Diagnosis not present

## 2022-10-06 DIAGNOSIS — H5201 Hypermetropia, right eye: Secondary | ICD-10-CM | POA: Diagnosis not present

## 2022-10-16 DIAGNOSIS — E782 Mixed hyperlipidemia: Secondary | ICD-10-CM | POA: Diagnosis not present

## 2022-10-16 DIAGNOSIS — N1832 Chronic kidney disease, stage 3b: Secondary | ICD-10-CM | POA: Diagnosis not present

## 2022-10-16 DIAGNOSIS — I5042 Chronic combined systolic (congestive) and diastolic (congestive) heart failure: Secondary | ICD-10-CM | POA: Diagnosis not present

## 2022-10-16 DIAGNOSIS — I2510 Atherosclerotic heart disease of native coronary artery without angina pectoris: Secondary | ICD-10-CM | POA: Diagnosis not present

## 2022-10-16 DIAGNOSIS — I10 Essential (primary) hypertension: Secondary | ICD-10-CM | POA: Diagnosis not present

## 2022-11-19 DIAGNOSIS — D485 Neoplasm of uncertain behavior of skin: Secondary | ICD-10-CM | POA: Diagnosis not present

## 2022-11-19 DIAGNOSIS — C4442 Squamous cell carcinoma of skin of scalp and neck: Secondary | ICD-10-CM | POA: Diagnosis not present

## 2022-11-19 DIAGNOSIS — L570 Actinic keratosis: Secondary | ICD-10-CM | POA: Diagnosis not present

## 2022-12-08 DIAGNOSIS — D044 Carcinoma in situ of skin of scalp and neck: Secondary | ICD-10-CM | POA: Diagnosis not present

## 2022-12-15 DIAGNOSIS — N1832 Chronic kidney disease, stage 3b: Secondary | ICD-10-CM | POA: Diagnosis not present

## 2022-12-15 DIAGNOSIS — I5042 Chronic combined systolic (congestive) and diastolic (congestive) heart failure: Secondary | ICD-10-CM | POA: Diagnosis not present

## 2022-12-15 DIAGNOSIS — I10 Essential (primary) hypertension: Secondary | ICD-10-CM | POA: Diagnosis not present

## 2022-12-15 DIAGNOSIS — I2510 Atherosclerotic heart disease of native coronary artery without angina pectoris: Secondary | ICD-10-CM | POA: Diagnosis not present

## 2022-12-15 DIAGNOSIS — E782 Mixed hyperlipidemia: Secondary | ICD-10-CM | POA: Diagnosis not present

## 2022-12-24 DIAGNOSIS — I130 Hypertensive heart and chronic kidney disease with heart failure and stage 1 through stage 4 chronic kidney disease, or unspecified chronic kidney disease: Secondary | ICD-10-CM | POA: Diagnosis not present

## 2022-12-24 DIAGNOSIS — Z6833 Body mass index (BMI) 33.0-33.9, adult: Secondary | ICD-10-CM | POA: Diagnosis not present

## 2022-12-24 DIAGNOSIS — Z0000 Encounter for general adult medical examination without abnormal findings: Secondary | ICD-10-CM | POA: Diagnosis not present

## 2022-12-24 DIAGNOSIS — E782 Mixed hyperlipidemia: Secondary | ICD-10-CM | POA: Diagnosis not present

## 2022-12-24 DIAGNOSIS — M109 Gout, unspecified: Secondary | ICD-10-CM | POA: Diagnosis not present

## 2022-12-24 DIAGNOSIS — I5042 Chronic combined systolic (congestive) and diastolic (congestive) heart failure: Secondary | ICD-10-CM | POA: Diagnosis not present

## 2022-12-24 DIAGNOSIS — R7303 Prediabetes: Secondary | ICD-10-CM | POA: Diagnosis not present

## 2022-12-24 DIAGNOSIS — I700 Atherosclerosis of aorta: Secondary | ICD-10-CM | POA: Diagnosis not present

## 2022-12-24 DIAGNOSIS — N1832 Chronic kidney disease, stage 3b: Secondary | ICD-10-CM | POA: Diagnosis not present

## 2022-12-24 DIAGNOSIS — I441 Atrioventricular block, second degree: Secondary | ICD-10-CM | POA: Diagnosis not present

## 2022-12-31 ENCOUNTER — Ambulatory Visit: Payer: Medicare HMO

## 2023-01-07 DIAGNOSIS — Z48817 Encounter for surgical aftercare following surgery on the skin and subcutaneous tissue: Secondary | ICD-10-CM | POA: Diagnosis not present

## 2023-01-07 DIAGNOSIS — Z4801 Encounter for change or removal of surgical wound dressing: Secondary | ICD-10-CM | POA: Diagnosis not present

## 2023-02-04 ENCOUNTER — Telehealth: Payer: Self-pay | Admitting: General Practice

## 2023-02-04 MED ORDER — ENTRESTO 24-26 MG PO TABS: 1.0000 | ORAL_TABLET | Freq: Two times a day (BID) | ORAL | 5 refills | Status: DC

## 2023-02-04 NOTE — Telephone Encounter (Signed)
Pt's medication was sent to pt's pharmacy as requested. Confirmation received.  °

## 2023-02-04 NOTE — Telephone Encounter (Signed)
*  STAT* If patient is at the pharmacy, call can be transferred to refill team.   1. Which medications need to be refilled? (please list name of each medication and dose if known) Entresto- need correct directions for this pt refill   2. Would you like to learn more about the convenience, safety, & potential cost savings by using the [facility]?    3. Are you open to using the [facility] (Type [facility].   4. Which pharmacy/location (including street and city if local pharmacy) is medication to be sent to? [facility]   5. Do they need a 30 day or 90 day supply? 30 days

## 2023-03-11 DIAGNOSIS — L918 Other hypertrophic disorders of the skin: Secondary | ICD-10-CM | POA: Diagnosis not present

## 2023-03-11 DIAGNOSIS — Z08 Encounter for follow-up examination after completed treatment for malignant neoplasm: Secondary | ICD-10-CM | POA: Diagnosis not present

## 2023-03-11 DIAGNOSIS — Z7189 Other specified counseling: Secondary | ICD-10-CM | POA: Diagnosis not present

## 2023-03-11 DIAGNOSIS — L821 Other seborrheic keratosis: Secondary | ICD-10-CM | POA: Diagnosis not present

## 2023-03-11 DIAGNOSIS — L815 Leukoderma, not elsewhere classified: Secondary | ICD-10-CM | POA: Diagnosis not present

## 2023-03-11 DIAGNOSIS — L814 Other melanin hyperpigmentation: Secondary | ICD-10-CM | POA: Diagnosis not present

## 2023-03-11 DIAGNOSIS — D485 Neoplasm of uncertain behavior of skin: Secondary | ICD-10-CM | POA: Diagnosis not present

## 2023-03-11 DIAGNOSIS — D2239 Melanocytic nevi of other parts of face: Secondary | ICD-10-CM | POA: Diagnosis not present

## 2023-03-11 DIAGNOSIS — L989 Disorder of the skin and subcutaneous tissue, unspecified: Secondary | ICD-10-CM | POA: Diagnosis not present

## 2023-03-11 DIAGNOSIS — L570 Actinic keratosis: Secondary | ICD-10-CM | POA: Diagnosis not present

## 2023-03-11 DIAGNOSIS — Z85828 Personal history of other malignant neoplasm of skin: Secondary | ICD-10-CM | POA: Diagnosis not present

## 2023-03-11 DIAGNOSIS — I8391 Asymptomatic varicose veins of right lower extremity: Secondary | ICD-10-CM | POA: Diagnosis not present

## 2023-03-11 DIAGNOSIS — D225 Melanocytic nevi of trunk: Secondary | ICD-10-CM | POA: Diagnosis not present

## 2023-03-27 DIAGNOSIS — C4339 Malignant melanoma of other parts of face: Secondary | ICD-10-CM | POA: Diagnosis not present

## 2023-04-01 ENCOUNTER — Ambulatory Visit: Payer: Medicare HMO

## 2023-04-21 DIAGNOSIS — L570 Actinic keratosis: Secondary | ICD-10-CM | POA: Diagnosis not present

## 2023-05-01 ENCOUNTER — Other Ambulatory Visit: Payer: Self-pay | Admitting: Internal Medicine

## 2023-05-25 ENCOUNTER — Other Ambulatory Visit: Payer: Self-pay

## 2023-05-25 ENCOUNTER — Emergency Department (HOSPITAL_BASED_OUTPATIENT_CLINIC_OR_DEPARTMENT_OTHER)
Admission: EM | Admit: 2023-05-25 | Discharge: 2023-05-25 | Disposition: A | Discharge status: Home / Self Care | Payer: Medicare HMO

## 2023-05-25 ENCOUNTER — Encounter (HOSPITAL_BASED_OUTPATIENT_CLINIC_OR_DEPARTMENT_OTHER): Payer: Self-pay | Admitting: Emergency Medicine

## 2023-05-25 DIAGNOSIS — L02214 Cutaneous abscess of groin: Secondary | ICD-10-CM | POA: Diagnosis not present

## 2023-05-25 DIAGNOSIS — Z7982 Long term (current) use of aspirin: Secondary | ICD-10-CM | POA: Diagnosis not present

## 2023-05-25 DIAGNOSIS — L0291 Cutaneous abscess, unspecified: Secondary | ICD-10-CM

## 2023-05-25 MED ORDER — SULFAMETHOXAZOLE-TRIMETHOPRIM 800-160 MG PO TABS: 1.0000 | ORAL_TABLET | Freq: Two times a day (BID) | ORAL | 0 refills | Status: AC

## 2023-05-25 MED ORDER — LIDOCAINE-EPINEPHRINE (PF) 2 %-1:200000 IJ SOLN
10.0000 mL | Freq: Once | INTRAMUSCULAR | Status: AC
  Administered 2023-05-25: 10 mL
  Filled 2023-05-25: qty 20

## 2023-05-25 NOTE — ED Notes (Signed)
Suture cart in room at bedside

## 2023-05-25 NOTE — ED Triage Notes (Signed)
Pt c/o "bump" on LT side groin x 3 days. Denies drainage or fever

## 2023-05-25 NOTE — ED Provider Notes (Signed)
[place] EMERGENCY DEPARTMENT AT [facility] Provider Note   CSN: [id number] Arrival date & time: 05/25/23  2725     History  Chief Complaint  Patient presents with   Abscess    [name] is a 77 y.o. male.  Is a 77 year old male presenting emergency department with a bump to his left groin x 3 days.  Started draining this morning.  Denies any fevers chills.  No abdominal pain, no nausea vomiting diarrhea.  No dysuria   Abscess      Home Medications Prior to Admission medications   Medication Sig Start Date End Date Taking? Authorizing Provider  sulfamethoxazole-trimethoprim (BACTRIM DS) 800-160 MG tablet Take 1 tablet by mouth 2 (two) times daily for 7 days. 05/25/23 06/01/23 Yes Brittain Hosie, Harmon Dun, DO  acetaminophen (TYLENOL) 500 MG tablet Take 500 mg by mouth every 6 (six) hours as needed for moderate pain.    [provider]  albuterol (VENTOLIN HFA) 108 (90 Base) MCG/ACT inhaler Inhale 1-2 puffs into the lungs every 6 (six) hours as needed for wheezing or shortness of breath.    [provider]  allopurinol (ZYLOPRIM) 300 MG tablet Take 300 mg by mouth Daily.  11/07/11   [provider]  amLODipine (NORVASC) 10 MG tablet Take 10 mg by mouth Daily.  11/07/11   [provider]  Apple Cider Vinegar 300 MG TABS Take 2 tablets by mouth daily.    [provider]  aspirin EC 81 MG EC tablet Take 1 tablet (81 mg total) by mouth daily. 02/10/15   Othella Boyer, MD  atorvastatin (LIPITOR) 40 MG tablet TAKE ONE TABLET BY MOUTH ONCE A WEEK 02/04/21   Kroeger, Dot Lanes M., PA-C  diphenhydrAMINE (BENADRYL) 25 MG tablet Take 50 mg by mouth 2 (two) times daily as needed for allergies.    [provider]  furosemide (LASIX) 40 MG tablet TAKE ONE TABLET BY MOUTH TWICE DAILY 08/21/22   Ronney Asters, NP  nitroGLYCERIN (NITROSTAT) 0.4 MG SL tablet Place 1 tablet (0.4 mg total) under the tongue every 5 (five) minutes as needed  for chest pain. 08/08/22 11/06/22  Ronney Asters, NP  ondansetron (ZOFRAN) 8 MG tablet Take 1 tablet (8 mg total) by mouth every 4 (four) hours as needed for nausea. 03/07/22   Mesner, Barbara Cower, MD  pantoprazole (PROTONIX) 40 MG tablet Take 40 mg by mouth daily. 06/07/18   [provider]  potassium chloride SA (KLOR-CON M) 20 MEQ tablet TAKE ONE TABLET BY MOUTH TWICE DAILY 01/20/22   Hilty, Lisette Abu, MD  sacubitril-valsartan (ENTRESTO) 24-26 MG Take 1 tablet by mouth 2 (two) times daily. 02/04/23   Ronney Asters, NP  spironolactone (ALDACTONE) 25 MG tablet TAKE 1 TABLET BY MOUTH ONCE DAILY 05/04/23   Hilty, Lisette Abu, MD      Allergies    Prednisone and Atorvastatin    Review of Systems   Review of Systems  Physical Exam Updated Vital Signs BP (!) 138/93   Pulse 63   Temp 97.6 F (36.4 C) (Oral)   Resp 18   Wt 112.5 kg   SpO2 100%   BMI 31.84 kg/m  Physical Exam Vitals and nursing note reviewed.  Constitutional:      General: He is not in acute distress.    Appearance: He is not toxic-appearing.  HENT:     Nose: Nose normal.     Mouth/Throat:     Mouth: Mucous membranes are moist.  Eyes:     Conjunctiva/sclera: Conjunctivae normal.  Cardiovascular:     Rate and Rhythm: Normal rate.  Pulmonary:     Effort: Pulmonary effort is normal.  Abdominal:     General: Abdomen is flat. There is no distension.     Tenderness: There is no abdominal tenderness. There is no guarding or rebound.  Genitourinary:    Comments: Small abscess to the left suprapubic area.  Roughly 5 cm area of induration.  It is draining pus. Musculoskeletal:        General: Normal range of motion.  Skin:    General: Skin is warm and dry.  Neurological:     General: No focal deficit present.     Mental Status: He is alert.  Psychiatric:        Mood and Affect: Mood normal.        Behavior: Behavior normal.     ED Results / Procedures / Treatments   Labs (all labs ordered are listed, but  only abnormal results are displayed) Labs Reviewed - No data to display  EKG None  Radiology No results found.  Procedures .Incision and Drainage  Date/Time: 05/25/2023 9:46 AM  Performed by: Coral Spikes, DO Authorized by: Coral Spikes, DO   Consent:    Consent obtained:  Verbal   Consent given by:  Patient   Risks discussed:  Bleeding, incomplete drainage, pain and infection   Alternatives discussed:  No treatment and delayed treatment Universal protocol:    Procedure explained and questions answered to patient or proxy's satisfaction: yes   Location:    Type:  Abscess   Size:  5   Location:  Lower extremity   Lower extremity location: Left groin. Pre-procedure details:    Skin preparation:  Chlorhexidine with alcohol Anesthesia:    Anesthesia method:  Local infiltration   Local anesthetic:  Lidocaine 1% WITH epi Procedure type:    Complexity:  Simple Procedure details:    Ultrasound guidance: no     Incision types:  Stab incision   Incision depth:  Dermal   Wound management:  Probed and deloculated   Drainage:  Bloody and purulent   Drainage amount:  Scant   Wound treatment:  Wound left open   Packing materials:  None Post-procedure details:    Procedure completion:  Tolerated     Medications Ordered in ED Medications  lidocaine-EPINEPHrine (XYLOCAINE W/EPI) 2 %-1:200000 (PF) injection 10 mL (10 mLs Infiltration Given by Other 05/25/23 1610)    ED Course/ Medical Decision Making/ A&P Clinical Course as of 05/25/23 0948  Mon May 25, 2023  9604 Per chart review appears GFR 48 with a baseline creatinine of roughly 1.5 [TY]    Clinical Course User Index [TY] Coral Spikes, DO                                 Medical Decision Making Is a well-appearing 77 year old male presenting emergency department with abscess to his left groin.  Afebrile vital signs reassuring.  Small abscess.  Bedside I&D performed.  Low suspicion for deep space infection.   Does not involve the genitals; low suspicion for Fournier's gangrene. See procedure note.  Per chart review has some mild CKD.  Will discharge on antibiotics.  Follow-up PCP.  Stable for discharge at this time.  Amount and/or Complexity of Data Reviewed Labs:     Details: Consider labs, however appears to  have superficial infection.  Low suspicion for systemic infection. Radiology:     Details: Consider ultrasound, however clinically patient with abscess.  It is draining pus.  Risk Prescription drug management. Decision regarding hospitalization. Risk Details: Given stable vital signs and exam patient unlikely benefit from hospitalization at this time.          Final Clinical Impression(s) / ED Diagnoses Final diagnoses:  Abscess    Rx / DC Orders ED Discharge Orders          Ordered    sulfamethoxazole-trimethoprim (BACTRIM DS) 800-160 MG tablet  2 times daily        05/25/23 0944              Coral Spikes, DO 05/25/23 [phone]

## 2023-05-25 NOTE — ED Notes (Signed)
Discharge instructions, follow up care, and prescription reviewed and explained, pt verbalized understanding and had no further questions on d/c. Pt caox4, ambulatory on d/c.

## 2023-05-25 NOTE — Discharge Instructions (Signed)
As discussed please follow-up with your primary doctor.  Please take antibiotics for the full course even if symptoms improve.  May take over-the-counter Tylenol for pain.  Please return immediately develop fevers, chills, worsening redness around abscess that is spreading.  Abdominal pain, inability to take your antibiotics due to nausea vomiting or any new or worsening symptoms that are concerning to you.

## 2023-05-26 DIAGNOSIS — L0291 Cutaneous abscess, unspecified: Secondary | ICD-10-CM | POA: Diagnosis not present

## 2023-06-12 DIAGNOSIS — I83811 Varicose veins of right lower extremities with pain: Secondary | ICD-10-CM | POA: Diagnosis not present

## 2023-06-12 DIAGNOSIS — I5042 Chronic combined systolic (congestive) and diastolic (congestive) heart failure: Secondary | ICD-10-CM | POA: Diagnosis not present

## 2023-06-12 DIAGNOSIS — L0292 Furuncle, unspecified: Secondary | ICD-10-CM | POA: Diagnosis not present

## 2023-07-01 ENCOUNTER — Ambulatory Visit: Payer: Medicare HMO

## 2023-07-03 ENCOUNTER — Other Ambulatory Visit: Payer: Self-pay | Admitting: General Practice

## 2023-07-22 ENCOUNTER — Other Ambulatory Visit: Payer: Self-pay

## 2023-07-22 DIAGNOSIS — D225 Melanocytic nevi of trunk: Secondary | ICD-10-CM | POA: Diagnosis not present

## 2023-07-22 DIAGNOSIS — Z85828 Personal history of other malignant neoplasm of skin: Secondary | ICD-10-CM | POA: Diagnosis not present

## 2023-07-22 DIAGNOSIS — I8391 Asymptomatic varicose veins of right lower extremity: Secondary | ICD-10-CM | POA: Diagnosis not present

## 2023-07-22 DIAGNOSIS — I872 Venous insufficiency (chronic) (peripheral): Secondary | ICD-10-CM | POA: Diagnosis not present

## 2023-07-22 DIAGNOSIS — L570 Actinic keratosis: Secondary | ICD-10-CM | POA: Diagnosis not present

## 2023-07-22 DIAGNOSIS — L814 Other melanin hyperpigmentation: Secondary | ICD-10-CM | POA: Diagnosis not present

## 2023-07-22 DIAGNOSIS — Z86006 Personal history of melanoma in-situ: Secondary | ICD-10-CM | POA: Diagnosis not present

## 2023-07-22 DIAGNOSIS — L918 Other hypertrophic disorders of the skin: Secondary | ICD-10-CM | POA: Diagnosis not present

## 2023-07-22 DIAGNOSIS — Z08 Encounter for follow-up examination after completed treatment for malignant neoplasm: Secondary | ICD-10-CM | POA: Diagnosis not present

## 2023-07-22 DIAGNOSIS — Z7189 Other specified counseling: Secondary | ICD-10-CM | POA: Diagnosis not present

## 2023-07-22 DIAGNOSIS — L821 Other seborrheic keratosis: Secondary | ICD-10-CM | POA: Diagnosis not present

## 2023-07-22 DIAGNOSIS — L815 Leukoderma, not elsewhere classified: Secondary | ICD-10-CM | POA: Diagnosis not present

## 2023-07-27 ENCOUNTER — Ambulatory Visit: Payer: Medicare HMO | Admitting: Student

## 2023-08-03 ENCOUNTER — Other Ambulatory Visit: Payer: Self-pay | Admitting: Internal Medicine

## 2023-08-03 NOTE — Progress Notes (Unsigned)
  Electrophysiology Office Note:   ID:  [name] 1945-10-27, MRN [id number]  Primary Cardiologist: Chrystie Nose, MD Electrophysiologist: Maurice Small, MD  {Click to update primary MD,subspecialty MD or APP then REFRESH:1}    History of Present Illness:   [name] is a 78 y.o. male with h/o symptomatic second degree block s/p PPM, CAD, and PVCs seen today for routine electrophysiology followup.   Since last being seen in our clinic the patient reports doing ***.  he denies chest pain, palpitations, dyspnea, PND, orthopnea, nausea, vomiting, dizziness, syncope, edema, weight gain, or early satiety.   Review of systems complete and found to be negative unless listed in HPI.   EP Information / Studies Reviewed:    EKG is ordered today. Personal review as below.       PPM Interrogation-  reviewed in detail today,  See PACEART report.  Arrhythmia/Device History Carelink - Leadless PPM - Medtronic   Echo 10/14/2021 LVEF 45-50%  Physical Exam:   VS:  There were no vitals taken for this visit.   Wt Readings from Last 3 Encounters:  05/25/23 248 lb (112.5 kg)  08/08/22 234 lb 6.4 oz (106.3 kg)  07/29/22 232 lb 3.2 oz (105.3 kg)     GEN: No acute distress  NECK: No JVD; No carotid bruits CARDIAC: {EPRHYTHM:28826}, no murmurs, rubs, gallops RESPIRATORY:  Clear to auscultation without rales, wheezing or rhonchi  ABDOMEN: Soft, non-tender, non-distended EXTREMITIES:  {EDEMA LEVEL:28147::"No"} edema; No deformity   ASSESSMENT AND PLAN:    Second Degree AV block s/p Medtronic PPM  Normal PPM function See Pace Art report No changes today  CAD HFmREF Denies s/s ischemia Latest EF 45-50% Continue GDMT as tolerated   {Click here to Review PMH, Prob List, Meds, Allergies, SHx, FHx  :1}   Disposition:   Follow up with {EPPROVIDERS:28135} {EPFOLLOW UP:28173}  Signed, Graciella Freer, PA-C

## 2023-08-04 ENCOUNTER — Ambulatory Visit: Payer: Medicare HMO | Attending: Student | Admitting: Student

## 2023-08-04 ENCOUNTER — Encounter: Payer: Self-pay | Admitting: Student

## 2023-08-04 VITALS — BP 120/64 | HR 66 | Ht 74.0 in | Wt 261.6 lb

## 2023-08-04 DIAGNOSIS — I2089 Other forms of angina pectoris: Secondary | ICD-10-CM

## 2023-08-04 DIAGNOSIS — I441 Atrioventricular block, second degree: Secondary | ICD-10-CM | POA: Diagnosis not present

## 2023-08-04 DIAGNOSIS — Z950 Presence of cardiac pacemaker: Secondary | ICD-10-CM

## 2023-08-04 DIAGNOSIS — I10 Essential (primary) hypertension: Secondary | ICD-10-CM | POA: Diagnosis not present

## 2023-08-04 DIAGNOSIS — I5042 Chronic combined systolic (congestive) and diastolic (congestive) heart failure: Secondary | ICD-10-CM | POA: Diagnosis not present

## 2023-08-04 DIAGNOSIS — E782 Mixed hyperlipidemia: Secondary | ICD-10-CM | POA: Diagnosis not present

## 2023-08-04 DIAGNOSIS — I2510 Atherosclerotic heart disease of native coronary artery without angina pectoris: Secondary | ICD-10-CM | POA: Diagnosis not present

## 2023-08-04 LAB — CUP PACEART INCLINIC DEVICE CHECK
Battery Remaining Longevity: 82 mo
Battery Voltage: 2.97 V
Brady Statistic AS VP Percent: 64.52 %
Brady Statistic AS VS Percent: 0.06 %
Brady Statistic RV Percent Paced: 98.24 %
Date Time Interrogation Session: 20250218090242
Implantable Pulse Generator Implant Date: 20201201
Lead Channel Impedance Value: 570 Ohm
Lead Channel Pacing Threshold Amplitude: 0.5 V
Lead Channel Pacing Threshold Pulse Width: 0.24 ms
Lead Channel Sensing Intrinsic Amplitude: 28.575 mV
Lead Channel Setting Pacing Amplitude: 1 V
Lead Channel Setting Pacing Pulse Width: 0.24 ms
Lead Channel Setting Sensing Sensitivity: 2 mV

## 2023-08-04 LAB — BASIC METABOLIC PANEL
BUN/Creatinine Ratio: 20 (ref 10–24)
BUN: 40 mg/dL — ABNORMAL HIGH (ref 8–27)
CO2: 23 mmol/L (ref 20–29)
Calcium: 11.4 mg/dL — ABNORMAL HIGH (ref 8.6–10.2)
Chloride: 102 mmol/L (ref 96–106)
Creatinine, Ser: 1.96 mg/dL — ABNORMAL HIGH (ref 0.76–1.27)
Glucose: 99 mg/dL (ref 70–99)
Potassium: 5.3 mmol/L — ABNORMAL HIGH (ref 3.5–5.2)
Sodium: 140 mmol/L (ref 134–144)
eGFR: 34 mL/min/{1.73_m2} — ABNORMAL LOW (ref >59)

## 2023-08-04 MED ORDER — ISOSORBIDE MONONITRATE ER 30 MG PO TB24
30.0000 mg | ORAL_TABLET | Freq: Every day | ORAL | 6 refills | Status: DC
Start: 2023-08-04 — End: 2023-10-12

## 2023-08-04 NOTE — Patient Instructions (Signed)
Medication Instructions:  Start isosorbide mononitrate (Imdur) 30 mg daily. *If you need a refill on your cardiac medications before your next appointment, please call your pharmacy*  Lab Work: BMET, CBC-TODAY If you have labs (blood work) drawn today and your tests are completely normal, you will receive your results only by: MyChart Message (if you have MyChart) OR A paper copy in the mail If you have any lab test that is abnormal or we need to change your treatment, we will call you to review the results.  Follow-Up: At [facility], you and your health needs are our priority.  As part of our continuing mission to provide you with exceptional heart care, we have created designated Provider Care Teams.  These Care Teams include your primary Cardiologist (physician) and Advanced Practice Providers (APPs -  Physician Assistants and Nurse Practitioners) who all work together to provide you with the care you need, when you need it.  Your next appointment:   1 year(s)  Provider:   York Pellant, MD    Schedule follow up appointment with Dr Rennis Golden per recall

## 2023-08-05 ENCOUNTER — Other Ambulatory Visit: Payer: Self-pay | Admitting: *Deleted

## 2023-08-05 DIAGNOSIS — Z79899 Other long term (current) drug therapy: Secondary | ICD-10-CM

## 2023-08-10 DIAGNOSIS — I2510 Atherosclerotic heart disease of native coronary artery without angina pectoris: Secondary | ICD-10-CM | POA: Diagnosis not present

## 2023-08-10 LAB — CBC

## 2023-08-11 LAB — CBC
Hematocrit: 46 % (ref 37.5–51.0)
Hemoglobin: 15.8 g/dL (ref 13.0–17.7)
MCH: 31.2 pg (ref 26.6–33.0)
MCHC: 34.3 g/dL (ref 31.5–35.7)
MCV: 91 fL (ref 79–97)
Platelets: 225 10*3/uL (ref 150–450)
RBC: 5.06 x10E6/uL (ref 4.14–5.80)
RDW: 13.4 % (ref 11.6–15.4)
WBC: 8.5 10*3/uL (ref 3.4–10.8)

## 2023-08-11 LAB — BASIC METABOLIC PANEL
BUN/Creatinine Ratio: 18 (ref 10–24)
BUN: 34 mg/dL — ABNORMAL HIGH (ref 8–27)
CO2: 25 mmol/L (ref 20–29)
Calcium: 10.8 mg/dL — ABNORMAL HIGH (ref 8.6–10.2)
Chloride: 102 mmol/L (ref 96–106)
Creatinine, Ser: 1.89 mg/dL — ABNORMAL HIGH (ref 0.76–1.27)
Glucose: 105 mg/dL — ABNORMAL HIGH (ref 70–99)
Potassium: 4 mmol/L (ref 3.5–5.2)
Sodium: 139 mmol/L (ref 134–144)
eGFR: 36 mL/min/{1.73_m2} — ABNORMAL LOW (ref >59)

## 2023-08-12 DIAGNOSIS — Z8249 Family history of ischemic heart disease and other diseases of the circulatory system: Secondary | ICD-10-CM | POA: Diagnosis not present

## 2023-08-12 DIAGNOSIS — E785 Hyperlipidemia, unspecified: Secondary | ICD-10-CM | POA: Diagnosis not present

## 2023-08-12 DIAGNOSIS — K5900 Constipation, unspecified: Secondary | ICD-10-CM | POA: Diagnosis not present

## 2023-08-12 DIAGNOSIS — Z008 Encounter for other general examination: Secondary | ICD-10-CM | POA: Diagnosis not present

## 2023-08-12 DIAGNOSIS — I110 Hypertensive heart disease with heart failure: Secondary | ICD-10-CM | POA: Diagnosis not present

## 2023-08-12 DIAGNOSIS — I25119 Atherosclerotic heart disease of native coronary artery with unspecified angina pectoris: Secondary | ICD-10-CM | POA: Diagnosis not present

## 2023-08-12 DIAGNOSIS — Z87891 Personal history of nicotine dependence: Secondary | ICD-10-CM | POA: Diagnosis not present

## 2023-08-12 DIAGNOSIS — K219 Gastro-esophageal reflux disease without esophagitis: Secondary | ICD-10-CM | POA: Diagnosis not present

## 2023-08-12 DIAGNOSIS — Z7982 Long term (current) use of aspirin: Secondary | ICD-10-CM | POA: Diagnosis not present

## 2023-08-12 DIAGNOSIS — N529 Male erectile dysfunction, unspecified: Secondary | ICD-10-CM | POA: Diagnosis not present

## 2023-08-12 DIAGNOSIS — M1990 Unspecified osteoarthritis, unspecified site: Secondary | ICD-10-CM | POA: Diagnosis not present

## 2023-08-12 DIAGNOSIS — I509 Heart failure, unspecified: Secondary | ICD-10-CM | POA: Diagnosis not present

## 2023-09-01 ENCOUNTER — Other Ambulatory Visit: Payer: Self-pay | Admitting: Internal Medicine

## 2023-09-01 ENCOUNTER — Ambulatory Visit (HOSPITAL_BASED_OUTPATIENT_CLINIC_OR_DEPARTMENT_OTHER): Admitting: Internal Medicine

## 2023-09-01 ENCOUNTER — Encounter (HOSPITAL_BASED_OUTPATIENT_CLINIC_OR_DEPARTMENT_OTHER): Payer: Self-pay | Admitting: Internal Medicine

## 2023-09-01 VITALS — BP 110/58 | HR 62 | Ht 74.0 in | Wt 264.6 lb

## 2023-09-01 DIAGNOSIS — R0789 Other chest pain: Secondary | ICD-10-CM

## 2023-09-01 DIAGNOSIS — R0609 Other forms of dyspnea: Secondary | ICD-10-CM

## 2023-09-01 DIAGNOSIS — I255 Ischemic cardiomyopathy: Secondary | ICD-10-CM | POA: Diagnosis not present

## 2023-09-01 DIAGNOSIS — I25110 Atherosclerotic heart disease of native coronary artery with unstable angina pectoris: Secondary | ICD-10-CM

## 2023-09-01 NOTE — Patient Instructions (Addendum)
 Medication Instructions:   REMAIN off amlodipine and entresto -- any med changes will depend on test results BP is low so not ideal to resume at this time  *If you need a refill on your cardiac medications before your next appointment, please call your pharmacy*  Testing/Procedures: Your physician has requested that you have an echocardiogram. Echocardiography is a painless test that uses sound waves to create images of your heart. It provides your doctor with information about the size and shape of your heart and how well your heart's chambers and valves are working. This procedure takes approximately one hour. There are no restrictions for this procedure. Please do NOT wear cologne, perfume, aftershave, or lotions (deodorant is allowed). Please arrive 15 minutes prior to your appointment time.  Please note: We ask at that you not bring children with you during ultrasound (echo/ vascular) testing. Due to room size and safety concerns, children are not allowed in the ultrasound rooms during exams. Our front office staff cannot provide observation of children in our lobby area while testing is being conducted. An adult accompanying a patient to their appointment will only be allowed in the ultrasound room at the discretion of the ultrasound technician under special circumstances. We apologize for any inconvenience.  Test done at: [facility]    Cardiac PET stress test at [facility]. You will get a call to schedule this once approved with insurance.    Follow-Up: At [facility], you and your health needs are our priority.  As part of our continuing mission to provide you with exceptional heart care, we have created designated Provider Care Teams.  These Care Teams include your primary Cardiologist (physician) and Advanced Practice Providers (APPs -  Physician Assistants and Nurse Practitioners) who all work together to provide you with the care you need, when you need it.  We  recommend signing up for the patient portal called "MyChart".  Sign up information is provided on this After Visit Summary.  MyChart is used to connect with patients for Virtual Visits (Telemedicine).  Patients are able to view lab/test results, encounter notes, upcoming appointments, etc.  Non-urgent messages can be sent to your provider as well.   To learn more about what you can do with MyChart, go to [email or web address].    Your next appointment:   3 months with Dr. Rennis Golden or Edd Fabian NP  Other Instructions    Please report to Radiology at the [facility] Main Entrance 30 minutes early for your test.  [address] [place], [place] [address]                         OR   Please report to Radiology at [facility] Main Entrance, medical mall, 30 mins prior to your test.  [address]  [place], [place]  How to Prepare for Your Cardiac PET/CT Stress Test:  Nothing to eat or drink, except water, 3 hours prior to arrival time.  NO caffeine/decaffeinated products, or chocolate 12 hours prior to arrival. (Please note decaffeinated beverages (teas/coffees) still contain caffeine).  If you have caffeine within 12 hours prior, the test will need to be rescheduled.  Medication instructions: Do not take erectile dysfunction medications for 72 hours prior to test (sildenafil, tadalafil) Do not take nitrates (isosorbide mononitrate, Ranexa) the day before or day of test Do not take tamsulosin the day before or morning of test Hold theophylline containing medications for  12 hours. Hold Dipyridamole 48 hours prior to the test.  Diabetic Preparation: If able to eat breakfast prior to 3 hour fasting, you may take all medications, including your insulin. Do not worry if you miss your breakfast dose of insulin - start at your next meal. If you do not eat prior to 3 hour fast-Hold all diabetes (oral and insulin) medications. Patients who wear a  continuous glucose monitor MUST remove the device prior to scanning.  You may take your remaining medications with water.  NO perfume, cologne or lotion on chest or abdomen area. FEMALES - Please avoid wearing dresses to this appointment.  Total time is 1 to 2 hours; you may want to bring reading material for the waiting time.  IF YOU THINK YOU MAY BE PREGNANT, OR ARE NURSING PLEASE INFORM THE TECHNOLOGIST.  In preparation for your appointment, medication and supplies will be purchased.  Appointment availability is limited, so if you need to cancel or reschedule, please call the Radiology Department Scheduler at [phone] 24 hours in advance to avoid a cancellation fee of $100.00  What to Expect When you Arrive:  Once you arrive and check in for your appointment, you will be taken to a preparation room within the Radiology Department.  A technologist or Nurse will obtain your medical history, verify that you are correctly prepped for the exam, and explain the procedure.  Afterwards, an IV will be started in your arm and electrodes will be placed on your skin for EKG monitoring during the stress portion of the exam. Then you will be escorted to the PET/CT scanner.  There, staff will get you positioned on the scanner and obtain a blood pressure and EKG.  During the exam, you will continue to be connected to the EKG and blood pressure machines.  A small, safe amount of a radioactive tracer will be injected in your IV to obtain a series of pictures of your heart along with an injection of a stress agent.    After your Exam:  It is recommended that you eat a meal and drink a caffeinated beverage to counter act any effects of the stress agent.  Drink plenty of fluids for the remainder of the day and urinate frequently for the first couple of hours after the exam.  Your doctor will inform you of your test results within 7-10 business days.  For more information and frequently asked questions, please  visit our website: [email or web address]  For questions about your test or how to prepare for your test, please call: Cardiac Imaging Nurse Navigators Office: [phone]

## 2023-09-01 NOTE — Progress Notes (Signed)
 '   OFFICE NOTE  Chief Complaint:  Chest pain/dyspnea on exertion  Primary Care Physician: Tally Joe, MD  HPI:  [name] is a 78 y.o. male with a past medial history significant for coronary disease and in January underwent left heart cath which showed an occluded distal circumflex.  Medical therapy was recommended.  This was complicated by some Wenkebach and he was evaluated by EP but not felt to be candidate for pacing.  He then developed progressive fatigue and worsening symptoms and was ultimately referred for pacemaker.  He underwent leadless pacemaker placement without complication and does report improvement in his symptoms.  He still remains significantly overweight and less active for which he will need to continue to work on.  He has an in office pacer check in about a week.  He also has sleep apnea but has been intolerant of BiPAP therapy and has follow-up in a week with his pulmonologist.  07/25/2020  [name] returns today for follow-up.  He is overall doing well since he had his pacemaker placed.  He is compliant with BiPAP.  He recently was recently hospitalized in January with acute on chronic CHF - LVEF is now lower at 45-50%. He is now on lasix, aldactone and Entresto. Subsequently, he contracted COVID-19.  He was treated with monoclonal antibody therapy on February 1.  He says he is asymptomatic.  He wants to try to return to work next Monday.  Blood pressure appears well controlled today.  Remote pacer checks are stable showing normal device function and no mode switches.  He reports that he is now feeling much better. He wants to return to work next Monday.  09/01/2023  [name] he is seen today in follow-up.  It has been a number of since I saw him but he has been followed generally by cardiac EP.  He required placement of a pacemaker and underwent a leadless pacemaker.  He continues to have normal device function he tells me about 7 years of battery life.   More recently he was seen in February and noted to have had some exertional chest pressure and discomfort.  This is concerning for angina.  He was noted to have some obstructive coronary disease by cath in 2020 however a distal lesion which was not fixed.  He was started on Imdur and notes that he had a headache for few days but continued on the medicine.  He says he may have had some mild improvement in chest discomfort but it persists.  He does report some shortness of breath as well.  He has been taking twice daily Lasix more recently.  Apparently he has run out of his Entresto and amlodipine.  Blood pressure today however was normal at 110/58.  PMHx:  Past Medical History:  Diagnosis Date   Arthritis    "left shoulder" (02/09/2015)   Blind left eye    CAD (coronary artery disease)    Closed fracture of lateral portion of right tibial plateau 11/01/2017   Complication of anesthesia    "they give me too much anesthesia & had to put me on life support for a little bit w/gallbladder OR"   Coronary artery disease    Former smoker    GERD (gastroesophageal reflux disease)    Gout    History of bleeding ulcers    History of gout    History of peptic ulcer disease    Hyperlipidemia    Hypertension    Hypertensive heart disease  without CHF    Kidney stones    Lumbar disc disease    Myocardial infarction [facility]) 1995; 2007   Obesity    OSA (obstructive sleep apnea)    Pneumonia X 2   Prostate cancer (HCC)    Pulmonary nodule    Second degree Mobitz I AV block     Past Surgical History:  Procedure Laterality Date   APPENDECTOMY     APPENDECTOMY  ~ 1985   BACK SURGERY     CARDIAC CATHETERIZATION N/A 02/08/2015   Procedure: Left Heart Cath and Coronary Angiography;  Surgeon: Marykay Lex, MD;  Location[facility] INVASIVE CV LAB;  Service: Cardiovascular;  Laterality: N/A;   CARDIAC CATHETERIZATION N/A 02/08/2015   Procedure: Coronary Stent Intervention;  Surgeon: Marykay Lex, MD;   Location: [facility] INVASIVE CV LAB;  Service: Cardiovascular;  Laterality: N/A;   CHOLECYSTECTOMY     CORONARY ANGIOPLASTY WITH STENT PLACEMENT  ~ 1995; 2007   "1 stent; 2 stents"   LAPAROSCOPIC CHOLECYSTECTOMY     LEFT HEART CATH AND CORONARY ANGIOGRAPHY N/A 07/13/2018   Procedure: LEFT HEART CATH AND CORONARY ANGIOGRAPHY;  Surgeon: Swazil[place], Peter M, MD;  Location: [facility] INVASIVE CV LAB;  Service: Cardiovascular;  Laterality: N/A;   LITHOTRIPSY  X 1   LUMBAR LAMINECTOMY     ORIF TIBIA PLATEAU Right 11/05/2017   Procedure: OPEN REDUCTION INTERNAL FIXATION (ORIF) TIBIAL PLATEAU;  Surgeon: Roby Lofts, MD;  Location: [facility] OR;  Service: Orthopedics;  Laterality: Right;   PACEMAKER LEADLESS INSERTION N/A 05/17/2019   Procedure: PACEMAKER LEADLESS INSERTION;  Surgeon: Hillis Range, MD;  Location: [facility] INVASIVE CV LAB;  Service: Cardiovascular;  Laterality: N/A;   POSTERIOR LUMBAR FUSION  2003?   PROSTATE BIOPSY  2015    FAMHx:  Family History  Problem Relation Age of Onset   Hypertension Mother    Coronary artery disease Mother    Aneurysm Father    Hypertension Father    COPD Sister    Sleep apnea Other        3 siblings    SOCHx:   reports that he quit smoking about 40 years ago. His smoking use included cigarettes. He started smoking about 75 years ago. He has a 105 pack-year smoking history. He has never used smokeless tobacco. He reports that he does not currently use alcohol. He reports that he does not use drugs.  ALLERGIES:  Allergies  Allergen Reactions   Prednisone Swelling    Other reaction(s): itching/swelling   Atorvastatin     Other reaction(s): leg cramps Other reaction(s): leg cramps Other reaction(s): leg cramps    ROS: Pertinent items noted in HPI and remainder of comprehensive ROS otherwise negative.  HOME MEDS: Current Outpatient Medications on File Prior to Visit  Medication Sig Dispense Refill   acetaminophen (TYLENOL) 500 MG tablet Take 500 mg by mouth every 6  (six) hours as needed for moderate pain.     albuterol (VENTOLIN HFA) 108 (90 Base) MCG/ACT inhaler Inhale 1-2 puffs into the lungs every 6 (six) hours as needed for wheezing or shortness of breath.     Apple Cider Vinegar 300 MG TABS Take 2 tablets by mouth daily.     aspirin EC 81 MG EC tablet Take 1 tablet (81 mg total) by mouth daily.     atorvastatin (LIPITOR) 40 MG tablet TAKE ONE TABLET BY MOUTH ONCE A WEEK (Patient taking differently: Take 40 mg by mouth 2 (two) times a week.) 4 tablet 2  isosorbide mononitrate (IMDUR) 30 MG 24 hr tablet Take 1 tablet (30 mg total) by mouth daily. 30 tablet 6   pantoprazole (PROTONIX) 40 MG tablet Take 40 mg by mouth daily.     potassium chloride SA (KLOR-CON M) 20 MEQ tablet TAKE ONE TABLET BY MOUTH TWICE DAILY 60 tablet 5   spironolactone (ALDACTONE) 25 MG tablet TAKE 1 TABLET BY MOUTH ONCE DAILY 30 tablet 0   allopurinol (ZYLOPRIM) 300 MG tablet Take 300 mg by mouth Daily.  (Patient not taking: Reported on 09/01/2023)     amLODipine (NORVASC) 10 MG tablet Take 10 mg by mouth Daily.  (Patient not taking: Reported on 09/01/2023)     diphenhydrAMINE (BENADRYL) 25 MG tablet Take 50 mg by mouth 2 (two) times daily as needed for allergies. (Patient not taking: Reported on 09/01/2023)     furosemide (LASIX) 40 MG tablet TAKE 1 TABLET BY MOUTH TWICE DAILY (Patient not taking: Reported on 09/01/2023) 60 tablet 10   nitroGLYCERIN (NITROSTAT) 0.4 MG SL tablet Place 1 tablet (0.4 mg total) under the tongue every 5 (five) minutes as needed for chest pain. 25 tablet 3   sacubitril-valsartan (ENTRESTO) 24-26 MG Take 1 tablet by mouth 2 (two) times daily. (Patient not taking: Reported on 09/01/2023) 60 tablet 5   No current facility-administered medications on file prior to visit.    LABS/IMAGING: No results found for this or any previous visit (from the past 48 hours). No results found.  LIPID PANEL:    Component Value Date/Time   CHOL 154 06/27/2020 0500   TRIG  204 (H) 06/27/2020 0500   HDL 30 (L) 06/27/2020 0500   CHOLHDL 5.1 06/27/2020 0500   VLDL 41 (H) 06/27/2020 0500   LDLCALC 83 06/27/2020 0500     WEIGHTS: Wt Readings from Last 3 Encounters:  09/01/23 264 lb 9.6 oz (120 kg)  08/04/23 261 lb 9.6 oz (118.7 kg)  05/25/23 248 lb (112.5 kg)    VITALS: BP (!) 110/58   Pulse 62   Ht 6\' 2"  (1.88 m)   Wt 264 lb 9.6 oz (120 kg)   SpO2 97%   BMI 33.97 kg/m   EXAM: General appearance: alert, no distress and morbidly obese Neck: no carotid bruit, no JVD and thyroid not enlarged, symmetric, no tenderness/mass/nodules Lungs: clear to auscultation bilaterally Heart: regular rate and rhythm Abdomen: soft, non-tender; bowel sounds normal; no masses,  no organomegaly Extremities: extremities normal, atraumatic, no cyanosis or edema Pulses: 2+ and symmetric Skin: Skin color, texture, turgor normal. No rashes or lesions Neurologic: Grossly normal Psych: Pleasant  EKG: Deferred  ASSESSMENT: Progressive chest pressure and dyspnea with exertion Coronary artery disease-occluded distal circumflex and patent stents in the LAD, RCA and circumflex (06/2018), LVEF 45-50% (06/2020) Chronic systolic heart failure, NYHA Class II symptoms (ICM) - LVEF 45-50% Hypertension Dyslipidemia Asthma with chronic shortness of breath Second-degree Type 1 AVB (Wenkebach) -status post leadless pacemaker  OSA on BIPAP CKD 3A based on labs in February 2025.  PLAN: 1.   Mr. Winer has had recent progressive chest pressure and dyspnea on exertion which was minimally improved with isosorbide.  He did have some obstructive coronary disease by cath in 2020.  He also has CKD and may be a marginal candidate for cardiac catheterization.  I would like to demonstrate for certainty that he had a perfusion defect before we could consider catheterization if he is having anginal symptoms.  Will pursue a cardiac PET scan.  In addition I like to  update his echocardiogram especially  since he has been off of his Entresto.  I will contact him with those results and proceed with additional testing as necessary.  Follow-up with me or APP in about 3 months.  Chrystie Nose, MD, [facility], FACP  [place]  [facility]  Medical Director of the [facility] Lipid [facility] &  Cardiovascular Risk Reduction Clinic Diplomate of the American Board of Clinical Lipidology Attending Cardiologist  Direct Dial: [phone]  Fax: [phone]  Website:  [email or web address] Nicely Torien Ramroop 09/01/2023, 10:23 AM

## 2023-09-18 ENCOUNTER — Ambulatory Visit (INDEPENDENT_AMBULATORY_CARE_PROVIDER_SITE_OTHER)

## 2023-09-18 DIAGNOSIS — I255 Ischemic cardiomyopathy: Secondary | ICD-10-CM

## 2023-09-18 DIAGNOSIS — I25110 Atherosclerotic heart disease of native coronary artery with unstable angina pectoris: Secondary | ICD-10-CM | POA: Diagnosis not present

## 2023-09-18 LAB — ECHOCARDIOGRAM COMPLETE
AR max vel: 2.08 cm2
AV Area VTI: 2.11 cm2
AV Area mean vel: 1.91 cm2
AV Mean grad: 3 mmHg
AV Peak grad: 6.3 mmHg
AV Vena cont: 0.22 cm
Ao pk vel: 1.25 m/s
Area-P 1/2: 2.64 cm2
S' Lateral: 4.76 cm

## 2023-09-18 MED ORDER — PERFLUTREN LIPID MICROSPHERE
1.0000 mL | INTRAVENOUS | Status: AC | PRN
  Administered 2023-09-18: 5 mL via INTRAVENOUS

## 2023-09-24 ENCOUNTER — Encounter (HOSPITAL_COMMUNITY): Payer: Self-pay

## 2023-09-28 ENCOUNTER — Telehealth (HOSPITAL_COMMUNITY): Payer: Self-pay | Admitting: *Deleted

## 2023-09-28 NOTE — Telephone Encounter (Signed)
Attempted to call patient regarding upcoming cardiac PET appointment. Voicemail box full.  Larey Brick RN Navigator Cardiac Imaging [facility] [phone] Office [phone] Cell

## 2023-09-29 ENCOUNTER — Ambulatory Visit (HOSPITAL_COMMUNITY)
Admission: RE | Admit: 2023-09-29 | Discharge: 2023-09-29 | Disposition: A | Discharge status: Home / Self Care | Source: Ambulatory Visit | Attending: Internal Medicine | Admitting: Internal Medicine

## 2023-09-29 DIAGNOSIS — I255 Ischemic cardiomyopathy: Secondary | ICD-10-CM | POA: Diagnosis present

## 2023-09-29 DIAGNOSIS — I25110 Atherosclerotic heart disease of native coronary artery with unstable angina pectoris: Secondary | ICD-10-CM

## 2023-09-29 LAB — NM PET CT CARDIAC PERFUSION MULTI W/ABSOLUTE BLOODFLOW
LV dias vol: 154 mL (ref 62–150)
LV sys vol: 91 mL
MBFR: 1.33
Nuc Rest EF: 41 %
Nuc Stress EF: 51 %
Rest MBF: 0.78 ml/g/min
Rest Nuclear Isotope Dose: 31 mCi
ST Depression (mm): 0 mm
Stress MBF: 1.04 ml/g/min
Stress Nuclear Isotope Dose: 31 mCi

## 2023-09-29 MED ORDER — RUBIDIUM RB82 GENERATOR (RUBYFILL)
31.0000 | PACK | Freq: Once | INTRAVENOUS | Status: AC
  Administered 2023-09-29: 31 via INTRAVENOUS

## 2023-09-29 MED ORDER — REGADENOSON 0.4 MG/5ML IV SOLN
INTRAVENOUS | Status: AC
  Filled 2023-09-29: qty 5

## 2023-09-29 MED ORDER — REGADENOSON 0.4 MG/5ML IV SOLN
0.4000 mg | Freq: Once | INTRAVENOUS | Status: AC
  Administered 2023-09-29: 0.4 mg via INTRAVENOUS

## 2023-09-29 MED ORDER — RUBIDIUM RB82 GENERATOR (RUBYFILL)
31.1000 | PACK | Freq: Once | INTRAVENOUS | Status: AC
  Administered 2023-09-29: 31.1 via INTRAVENOUS

## 2023-09-30 ENCOUNTER — Encounter: Payer: Self-pay | Admitting: Cardiovascular Disease

## 2023-09-30 NOTE — Progress Notes (Signed)
 I would start the amlodipine between now and office visit, including today, to see if that helps the "heartburn" which is probably angina

## 2023-10-02 ENCOUNTER — Ambulatory Visit (HOSPITAL_BASED_OUTPATIENT_CLINIC_OR_DEPARTMENT_OTHER): Admitting: Family

## 2023-10-02 ENCOUNTER — Encounter (HOSPITAL_BASED_OUTPATIENT_CLINIC_OR_DEPARTMENT_OTHER): Payer: Self-pay | Admitting: Family

## 2023-10-02 VITALS — BP 132/70 | HR 62 | Ht 74.0 in | Wt 263.8 lb

## 2023-10-02 DIAGNOSIS — E785 Hyperlipidemia, unspecified: Secondary | ICD-10-CM | POA: Diagnosis not present

## 2023-10-02 DIAGNOSIS — I255 Ischemic cardiomyopathy: Secondary | ICD-10-CM | POA: Diagnosis not present

## 2023-10-02 DIAGNOSIS — R0789 Other chest pain: Secondary | ICD-10-CM

## 2023-10-02 DIAGNOSIS — I5022 Chronic systolic (congestive) heart failure: Secondary | ICD-10-CM

## 2023-10-02 DIAGNOSIS — I25110 Atherosclerotic heart disease of native coronary artery with unstable angina pectoris: Secondary | ICD-10-CM | POA: Diagnosis not present

## 2023-10-02 NOTE — H&P (View-Only) (Signed)
 Cardiology Office Note:  .   Date:  10/02/2023  ID:  [name], DOB 1945-12-01, MRN [id number] PCP: Billy Bugler, MD  [facility] Providers Cardiologist:  Hazle Lites, MD Electrophysiologist:  Efraim Grange, MD    History of Present Illness: .   [name] is a 78 y.o. male with hx of CAD (s/p stent in LAD, RCA, LCx), HLD, OSA on BIPAP, CKD3a, second degree type 1 AVB s/p leadless PPM, HFrEF, asthma.  Seen by Dr. Maximo Spar 09/01/23 with progressive chest pressure, dyspnea with exertion. Echo 09/18/23 LVEF 45-50%, apical hypokinesis, LV swirling with risk for thrombus (no clear LV apical thrombus). 09/29/23 cardiac PET with two defects one nonreversible in area of known occluded distal Cx and another consistent with peri-infarct ischemia in territory of RCA, rest LVEF 41% and stress LVEF 51%,   Discussed the use of AI scribe software for clinical note transcription with the patient, who gave verbal consent to proceed.  History of Present Illness [name], a patient with a history of heart disease, presents with chest pain and shortness of breath. Granddaughter present today as well. The symptoms are particularly noticeable when walking or working, but do not cause nausea or sweating. The patient reports that he has been sleeping in a recliner for the past two years, initially due to a need for BIPAP but no longer using BIPAP. He denies experiencing shortness of breath when sitting still or lying down.  In addition to his cardiac symptoms, [name] is the primary caregiver for his wife, who has Alzheimer's disease. 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 Despite the physical demands of his job, which involves moving and lifting heavy objects, [name] enjoys his work and does not wish to quit. We reviewed findings  on cardiac PET which  suggest a need for further intervention.   ROS: Please see the  history of present illness.    All other systems reviewed and are negative.   Studies Reviewed: .        Cardiac Studies & Procedures   ______________________________________________________________________________________________ CARDIAC CATHETERIZATION  CARDIAC CATHETERIZATION 07/13/2018  Conclusion  Previously placed Mid RCA stent (unknown type) is widely patent.  Dist RCA lesion is 30% stenosed.  Prox LAD lesion is 35% stenosed.  Previously placed Prox Cx stent (unknown type) is widely patent.  Previously placed Mid Cx stent (unknown type) is widely patent.  Dist Cx lesion is 100% stenosed with 100% stenosed side branch in Ost 3rd Mrg.  The left ventricular systolic function is normal.  LV end diastolic pressure is mildly elevated.  The left ventricular ejection fraction is 55-65% by visual estimate.  1. Single vessel occlusive CAD involving the distal LCx. 2. Patent stents in the LAD, RCA and LCx 3. Normal LV function 4. Mildly elevated LVEDP  Plan: medical management.  Findings Coronary Findings Diagnostic  Dominance: Right  Left Anterior Descending Prox LAD lesion is 35% stenosed. The lesion was previously treated using a bare metal stent over 2 years ago.  Left Circumflex Previously placed Prox Cx stent (unknown type) is widely patent. Previously placed Mid Cx stent (unknown type) is widely patent. Dist Cx lesion is 100% stenosed with 100% stenosed side branch in Ost 3rd Mrg.  Third Obtuse Marginal Branch Collaterals 3rd Mrg filled by collaterals from 2nd RPL.  Right Coronary Artery Previously placed Mid RCA stent (unknown type) is widely patent. Dist RCA lesion is 30% stenosed.  The lesion is eccentric and irregular.  Intervention  No interventions have been documented.   CARDIAC CATHETERIZATION  CARDIAC CATHETERIZATION 02/08/2015  Conclusion Images from the original result were not included. 1. Prox RCA to Mid RCA lesion, 95% stenosed. A  REBEL 4.0 mm x 20 mm bare metal stent was placed. There is a 0% residual stenosis post intervention. 2. Ost LAD to Mid LAD lesion, 30% stenosed. The lesion was previously treated with a bare metal stent greater than two years ago. Mid LAD lesion, 30% stenosed. beyond the stent 3. 2nd Mrg-2 lesion, 10% stenosed. The lesion was previously treated with a bare metal stent one to two years ago. 4. The left ventricular systolic function is normal. 5. Moderately elevated LVEDP with systemic HTN   Successful PCI of mid RCA with bare metal stent with excellent result  Plan:  Transfer to [facility] postprocedure unit for post PCI TR band removal  Based on ACS presentation, would prefer will and a platelet therapy for minimum one year, however with bare-metal stent this could be suspended as early as 2 months.  Continue aggressive risk factor modification.    He will follow up with Dr. Stan Eans, DAVID Dyana Glade, M.D., M.S. Interventional Cardiologist  Pager # [phone]  Findings Coronary Findings Diagnostic  Dominance: Right  Left Main The vessel was injected is large is angiographically normal. TIG 4.0 CATHETER  Left Anterior Descending diffuse .  The lesion was previously treated with a bare metal stent greater than two years ago.  2007 diffuse .  First Diagonal Branch The vessel is small in size. There is mild in the vessel.  First Septal Branch The vessel is small in size.  Second Diagonal Branch The vessel is small in size and exhibits minimal luminal irregularities.  Second Septal Branch The vessel is small in size.  Third Septal Branch The vessel is small in size.  Left Circumflex  First Obtuse Marginal Branch The vessel is moderate in size.  Second Obtuse Marginal Branch discrete . diffuse .  The lesion was previously treated with a bare metal stent one to two years ago.  2007  Lateral Second Obtuse Marginal Branch The vessel is small in size.  Right Coronary  Artery The vessel was injected is large . TIG 4.0 CATHETER discrete . The lesion is type non-C tubular eccentric ulcerative .  Inferior Septal The vessel is small in size.  Right Posterior Atrioventricular Artery The vessel is large in size.  First Right Posterolateral Branch The vessel is moderate in size and exhibits minimal luminal irregularities.  Second Right Posterolateral Branch The vessel is large in size.  Intervention  Prox RCA to Mid RCA lesion PCI The pre-interventional distal flow is normal (TIMI 3). Pre-stent angioplasty was performed. A bare metal stent was placed. Minimum lumen area: 4.3 mm. The strut is apposed. Post-stent angioplasty was performed. Post-dilated with Stent Balloon to 16 Atm Lesion length: 16 mm. Maximum pressure: 16 atm. The post-interventional distal flow is normal (TIMI 3). The intervention was successful. No complications occurred at this lesion.   Angiomax  Bolus & gtt Brilinta  180 mg x 1  CATH VISTA GUIDE 6FR JR4 WIRE ASAHI PROWATER 180CM Supplies used: BALLN EMERGE MR 2.5X12; STENT REBEL MR 4.0X20 There is a 0% residual stenosis post intervention.   STRESS TESTS  NM PET CT CARDIAC PERFUSION MULTI W/ABSOLUTE BLOODFLOW 09/29/2023  Narrative   LV perfusion is abnormal. Defect 1: There is a medium defect with moderate reduction in uptake present  in the apical to basal inferior location(s) that is reversible. There is abnormal wall motion in the defect area. Consistent with ischemia. The defect is consistent with abnormal perfusion in the RCA territory. Defect 2: There is a medium defect present in the apical lateral and apex location(s). There is abnormal wall motion in the defect area. Consistent with infarction and peri-infarct ischemia. The defect is consistent with abnormal perfusion in the LAD territory.   Rest left ventricular function is abnormal. Rest global function is moderately reduced. There was a single regional abnormality. Rest EF:  41%. Stress left ventricular function is abnormal. Stress global function is mildly reduced. There was a single regional abnormality. Stress EF: 51%. End diastolic cavity size is mildly enlarged. End systolic cavity size is mildly enlarged.   Myocardial blood flow was computed to be 0.94ml/g/min at rest and 1.04ml/g/min at stress. Global myocardial blood flow reserve was 1.33 and was abnormal.   Coronary calcium  assessment not performed due to prior revascularization. Due to multiple prior stents   Findings are consistent with infarction with peri-infarct ischemia. The study is intermediate risk.  CLINICAL DATA:  This over-read does not include interpretation of cardiac or coronary anatomy or pathology. No interpretation the PET data set. The cardiac PET-CT interpretation by the cardiologist is attached.  COMPARISON:  None Available.  FINDINGS: Limited view of the lung parenchyma demonstrates 5 mm LEFT lobe pulmonary nodule (image 56/3) adjacent to a band of benign atelectasis. Airways are normal.  Limited view of the mediastinum demonstrates no adenopathy. Esophagus normal.  Limited view of the upper abdomen unremarkable.  Limited view of the skeleton and chest wall is unremarkable.  IMPRESSION: LEFT lobe pulmonary nodule. No follow-up needed if patient is low-risk.This recommendation follows the consensus statement: Guidelines for Management of Incidental Pulmonary Nodules Detected on CT Images: From the Fleischner Society 2017; Radiology 2017; 284:228-243.   Electronically Signed By: Deboraha Fallow M.D. On: 09/29/2023 13:34   ECHOCARDIOGRAM  ECHOCARDIOGRAM COMPLETE 09/18/2023  Narrative ECHOCARDIOGRAM REPORT    Patient Name:   RENNER SEABROOK [facility] Date of Exam: 09/18/2023 Medical Rec #:  [id number]       Height:       74.0 in Accession #:    [id number]      Weight:       264.6 lb Date of Birth:  [birth year]       BSA:          2.448 m Patient Age:    78 years        BP:            110/58 mmHg Patient Gender: M               HR:           59 bpm. Exam Location:  Outpatient  Procedure: 2D Echo, 3D Echo, Cardiac Doppler, Color Doppler and Intracardiac Opacification Agent (Both Spectral and Color Flow Doppler were utilized during procedure).  Indications:    R07.2 Precordial pain; R06.9 DOE; R60.0 Lower extremity edema  History:        Patient has prior history of Echocardiogram examinations, most recent 10/14/2021. CHF, Previous Myocardial Infarction and CAD, Leadless pacer, Arrythmias:LBBB and 2nd degree AV Block, Signs/Symptoms:Edema, Dyspnea and Chest Pain; Risk Factors:Former Smoker, Hypertension, Dyslipidemia, Sleep Apnea and CKD stage 3a. Patient has had chest pain, DOE and leg edema.  Sonographer:    Richarda Chance RVT, RDCS (AE), RDMS Referring Phys: [id number] KENNETH C HILTY   Sonographer Comments:  Suboptimal parasternal window, suboptimal apical window, no subcostal window and patient is obese. Image acquisition challenging due to patient body habitus. IMPRESSIONS   1. The Micra AV leadless pacemaker not see on these images implant in RV apex. 2. Septal/apical, inferior apical hypokinesis Swirling artifact in apex with definity  do not think he has thrombus . Left ventricular ejection fraction, by estimation, is 45 to 50%. The left ventricle has mildly decreased function. The left ventricle demonstrates regional wall motion abnormalities (see scoring diagram/findings for description). The left ventricular internal cavity size was mildly dilated. There is mild left ventricular hypertrophy. Left ventricular diastolic parameters were normal. 3. Right ventricular systolic function is normal. The right ventricular size is normal. 4. Left atrial size was mildly dilated. 5. Some splay artifact on color doppler of MR signal . The mitral valve is abnormal. Mild mitral valve regurgitation. No evidence of mitral stenosis. 6. The aortic valve is tricuspid. There is  moderate calcification of the aortic valve. There is moderate thickening of the aortic valve. Aortic valve regurgitation is trivial. No aortic stenosis is present. 7. The inferior vena cava is normal in size with greater than 50% respiratory variability, suggesting right atrial pressure of 3 mmHg.  Comparison(s): EF 45%, moderate LVH, apical hypokinesis, paradoxical septal motion,.  FINDINGS Left Ventricle: Septal/apical, inferior apical hypokinesis Swirling artifact in apex with definity  do not think he has thrombus. Left ventricular ejection fraction, by estimation, is 45 to 50%. The left ventricle has mildly decreased function. The left ventricle demonstrates regional wall motion abnormalities. Definity  contrast agent was given IV to delineate the left ventricular endocardial borders. Strain was performed and the global longitudinal strain is indeterminate. The left ventricular internal cavity size was mildly dilated. There is mild left ventricular hypertrophy. Left ventricular diastolic parameters were normal.  Right Ventricle: The right ventricular size is normal. No increase in right ventricular wall thickness. Right ventricular systolic function is normal.  Left Atrium: Left atrial size was mildly dilated.  Right Atrium: Right atrial size was normal in size.  Pericardium: There is no evidence of pericardial effusion.  Mitral Valve: Some splay artifact on color doppler of MR signal. The mitral valve is abnormal. There is mild thickening of the mitral valve leaflet(s). There is mild calcification of the mitral valve leaflet(s). Mild mitral annular calcification. Mild mitral valve regurgitation. No evidence of mitral valve stenosis.  Tricuspid Valve: The tricuspid valve is normal in structure. Tricuspid valve regurgitation is trivial. No evidence of tricuspid stenosis.  Aortic Valve: The aortic valve is tricuspid. There is moderate calcification of the aortic valve. There is moderate  thickening of the aortic valve. Aortic valve regurgitation is trivial. No aortic stenosis is present. Aortic valve mean gradient measures 3.0 mmHg. Aortic valve peak gradient measures 6.2 mmHg. Aortic valve area, by VTI measures 2.11 cm.  Pulmonic Valve: The pulmonic valve was normal in structure. Pulmonic valve regurgitation is not visualized. No evidence of pulmonic stenosis.  Aorta: The aortic root is normal in size and structure.  Venous: The inferior vena cava is normal in size with greater than 50% respiratory variability, suggesting right atrial pressure of 3 mmHg.  IAS/Shunts: No atrial level shunt detected by color flow Doppler.  Additional Comments: The Micra AV leadless pacemaker not see on these images implant in RV apex. 3D was performed not requiring image post processing on an independent workstation and was abnormal.   LEFT VENTRICLE PLAX 2D LVIDd:         6.63 cm LVIDs:  4.76 cm LV PW:         1.26 cm LV IVS:        1.11 cm LVOT diam:     2.00 cm   3D Volume EF: LV SV:         50        3D EF:        53 % LV SV Index:   21        LV EDV:       148 ml LVOT Area:     3.14 cm  LV ESV:       69 ml LV SV:        79 ml  RIGHT VENTRICLE RV S prime:     14.40 cm/s TAPSE (M-mode): 1.5 cm  LEFT ATRIUM              Index        RIGHT ATRIUM           Index LA diam:        4.20 cm  1.72 cm/m   RA Area:     22.50 cm LA Vol (A2C):   101.0 ml 41.26 ml/m  RA Volume:   72.70 ml  29.70 ml/m LA Vol (A4C):   119.0 ml 48.61 ml/m LA Biplane Vol: 110.0 ml 44.93 ml/m AORTIC VALVE                    PULMONIC VALVE AV Area (Vmax):    2.08 cm     PV Vmax:       0.90 m/s AV Area (Vmean):   1.91 cm     PV Peak grad:  3.2 mmHg AV Area (VTI):     2.11 cm AV Vmax:           125.00 cm/s AV Vmean:          82.700 cm/s AV VTI:            0.238 m AV Peak Grad:      6.2 mmHg AV Mean Grad:      3.0 mmHg LVOT Vmax:         82.90 cm/s LVOT Vmean:        50.200 cm/s LVOT VTI:           0.160 m LVOT/AV VTI ratio: 0.67 AR Vena Contracta: 0.22 cm  AORTA Ao Root diam: 3.50 cm Ao Asc diam:  3.30 cm Ao Arch diam: 3.2 cm  MITRAL VALVE               TRICUSPID VALVE MV Area (PHT): 2.64 cm    TR Peak grad:   13.8 mmHg MV Decel Time: 287 msec    TR Vmax:        186.00 cm/s MV E velocity: 83.30 cm/s MV A velocity: 81.20 cm/s  SHUNTS MV E/A ratio:  1.03        Systemic VTI:  0.16 m Systemic Diam: 2.00 cm  Janelle Mediate MD Electronically signed by Janelle Mediate MD Signature Date/Time: 09/18/2023/12:27:05 PM    Final          ______________________________________________________________________________________________      Risk Assessment/Calculations:             Physical Exam:   VS:  BP 132/70   Pulse 62   Ht 6\' 2"  (1.88 m)   Wt 263 lb 12.8 oz (119.7 kg)   SpO2 97%   BMI 33.87 kg/m  Wt Readings from Last 3 Encounters:  10/02/23 263 lb 12.8 oz (119.7 kg)  09/01/23 264 lb 9.6 oz (120 kg)  08/04/23 261 lb 9.6 oz (118.7 kg)    GEN: Well nourished, well developed in no acute distress NECK: No JVD; No carotid bruits CARDIAC: RRR, no murmurs, rubs, gallops RESPIRATORY:  Clear to auscultation without rales, wheezing or rhonchi  ABDOMEN: Soft, non-tender, non-distended EXTREMITIES:  No edema; No deformity   ASSESSMENT AND PLAN: .    Assessment & Plan Coronary artery disease / HLD, LDL goal <70 PET scan with peri infarct ischemia in RCA territory, correlating with present exertional chest pain and dyspnea. Cardiac catheterization planned to assess and potentially intervene on coronary arteries, particularly right coronary artery. - Schedule cardiac catheterization for October 12, 2023 (date per his preference due to work schedule, he will contact us  if symptoms worsen since that time) - Perform pre-procedure labs BMET, CBC - GDMT Aspirin  81mg  daily, Atorvastatin  40mg  daily, Imdur  30mg  daily.   HFmrEF Symptoms consistent with heart failure, including  exertional dyspnea and orthopnea. Reduced ejection fraction indicates systolic dysfunction. Manages fluid retention with furosemide , occasionally doubling dose, indicating intermittent volume overload. - Continue furosemide  40mg  BID, Spironolactone  25mg  daily, Imdur  30mg  daily.  -Careful titration of diuretic and antihypertensive given CKD - Monitor potassium levels and adjust supplementation accordingly.       Informed Consent   Shared Decision Making/Informed Consent The risks [stroke (1 in 1000), death (1 in 1000), kidney failure [usually temporary] (1 in 500), bleeding (1 in 200), allergic reaction [possibly serious] (1 in 200)], benefits (diagnostic support and management of coronary artery disease) and alternatives of a cardiac catheterization were discussed in detail with Mr. Lisonbee and he is willing to proceed.     Dispo: follow up 2-3 weeks after LHC  Signed, Clearnce Curia, NP

## 2023-10-02 NOTE — Progress Notes (Signed)
 Cardiology Office Note:  .   Date:  10/02/2023  ID:  [name], DOB 1945-12-01, MRN [id number] PCP: Rae Bugler, MD  [facility] Providers Cardiologist:  Hazle Lites, MD Electrophysiologist:  Efraim Grange, MD    History of Present Illness: .   [name] is a 78 y.o. male with hx of CAD (s/p stent in LAD, RCA, LCx), HLD, OSA on BIPAP, CKD3a, second degree type 1 AVB s/p leadless PPM, HFrEF, asthma.  Seen by Dr. Maximo Spar 09/01/23 with progressive chest pressure, dyspnea with exertion. Echo 09/18/23 LVEF 45-50%, apical hypokinesis, LV swirling with risk for thrombus (no clear LV apical thrombus). 09/29/23 cardiac PET with two defects one nonreversible in area of known occluded distal Cx and another consistent with peri-infarct ischemia in territory of RCA, rest LVEF 41% and stress LVEF 51%,   Discussed the use of AI scribe software for clinical note transcription with the patient, who gave verbal consent to proceed.  History of Present Illness [name], a patient with a history of heart disease, presents with chest pain and shortness of breath. Granddaughter present today as well. The symptoms are particularly noticeable when walking or working, but do not cause nausea or sweating. The patient reports that he has been sleeping in a recliner for the past two years, initially due to a need for BIPAP but no longer using BIPAP. He denies experiencing shortness of breath when sitting still or lying down.  In addition to his cardiac symptoms, [name] is the primary caregiver for his wife, who has Alzheimer's disease. 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 Despite the physical demands of his job, which involves moving and lifting heavy objects, [name] enjoys his work and does not wish to quit. We reviewed findings  on cardiac PET which  suggest a need for further intervention.   ROS: Please see the  history of present illness.    All other systems reviewed and are negative.   Studies Reviewed: .        Cardiac Studies & Procedures   ______________________________________________________________________________________________ CARDIAC CATHETERIZATION  CARDIAC CATHETERIZATION 07/13/2018  Conclusion  Previously placed Mid RCA stent (unknown type) is widely patent.  Dist RCA lesion is 30% stenosed.  Prox LAD lesion is 35% stenosed.  Previously placed Prox Cx stent (unknown type) is widely patent.  Previously placed Mid Cx stent (unknown type) is widely patent.  Dist Cx lesion is 100% stenosed with 100% stenosed side branch in Ost 3rd Mrg.  The left ventricular systolic function is normal.  LV end diastolic pressure is mildly elevated.  The left ventricular ejection fraction is 55-65% by visual estimate.  1. Single vessel occlusive CAD involving the distal LCx. 2. Patent stents in the LAD, RCA and LCx 3. Normal LV function 4. Mildly elevated LVEDP  Plan: medical management.  Findings Coronary Findings Diagnostic  Dominance: Right  Left Anterior Descending Prox LAD lesion is 35% stenosed. The lesion was previously treated using a bare metal stent over 2 years ago.  Left Circumflex Previously placed Prox Cx stent (unknown type) is widely patent. Previously placed Mid Cx stent (unknown type) is widely patent. Dist Cx lesion is 100% stenosed with 100% stenosed side branch in Ost 3rd Mrg.  Third Obtuse Marginal Branch Collaterals 3rd Mrg filled by collaterals from 2nd RPL.  Right Coronary Artery Previously placed Mid RCA stent (unknown type) is widely patent. Dist RCA lesion is 30% stenosed.  The lesion is eccentric and irregular.  Intervention  No interventions have been documented.   CARDIAC CATHETERIZATION  CARDIAC CATHETERIZATION 02/08/2015  Conclusion Images from the original result were not included. 1. Prox RCA to Mid RCA lesion, 95% stenosed. A  REBEL 4.0 mm x 20 mm bare metal stent was placed. There is a 0% residual stenosis post intervention. 2. Ost LAD to Mid LAD lesion, 30% stenosed. The lesion was previously treated with a bare metal stent greater than two years ago. Mid LAD lesion, 30% stenosed. beyond the stent 3. 2nd Mrg-2 lesion, 10% stenosed. The lesion was previously treated with a bare metal stent one to two years ago. 4. The left ventricular systolic function is normal. 5. Moderately elevated LVEDP with systemic HTN   Successful PCI of mid RCA with bare metal stent with excellent result  Plan:  Transfer to [facility] postprocedure unit for post PCI TR band removal  Based on ACS presentation, would prefer will and a platelet therapy for minimum one year, however with bare-metal stent this could be suspended as early as 2 months.  Continue aggressive risk factor modification.    He will follow up with Dr. Stan Eans, DAVID Dyana Glade, M.D., M.S. Interventional Cardiologist  Pager # [phone]  Findings Coronary Findings Diagnostic  Dominance: Right  Left Main The vessel was injected is large is angiographically normal. TIG 4.0 CATHETER  Left Anterior Descending diffuse .  The lesion was previously treated with a bare metal stent greater than two years ago.  2007 diffuse .  First Diagonal Branch The vessel is small in size. There is mild in the vessel.  First Septal Branch The vessel is small in size.  Second Diagonal Branch The vessel is small in size and exhibits minimal luminal irregularities.  Second Septal Branch The vessel is small in size.  Third Septal Branch The vessel is small in size.  Left Circumflex  First Obtuse Marginal Branch The vessel is moderate in size.  Second Obtuse Marginal Branch discrete . diffuse .  The lesion was previously treated with a bare metal stent one to two years ago.  2007  Lateral Second Obtuse Marginal Branch The vessel is small in size.  Right Coronary  Artery The vessel was injected is large . TIG 4.0 CATHETER discrete . The lesion is type non-C tubular eccentric ulcerative .  Inferior Septal The vessel is small in size.  Right Posterior Atrioventricular Artery The vessel is large in size.  First Right Posterolateral Branch The vessel is moderate in size and exhibits minimal luminal irregularities.  Second Right Posterolateral Branch The vessel is large in size.  Intervention  Prox RCA to Mid RCA lesion PCI The pre-interventional distal flow is normal (TIMI 3). Pre-stent angioplasty was performed. A bare metal stent was placed. Minimum lumen area: 4.3 mm. The strut is apposed. Post-stent angioplasty was performed. Post-dilated with Stent Balloon to 16 Atm Lesion length: 16 mm. Maximum pressure: 16 atm. The post-interventional distal flow is normal (TIMI 3). The intervention was successful. No complications occurred at this lesion.   Angiomax  Bolus & gtt Brilinta  180 mg x 1  CATH VISTA GUIDE 6FR JR4 WIRE ASAHI PROWATER 180CM Supplies used: BALLN EMERGE MR 2.5X12; STENT REBEL MR 4.0X20 There is a 0% residual stenosis post intervention.   STRESS TESTS  NM PET CT CARDIAC PERFUSION MULTI W/ABSOLUTE BLOODFLOW 09/29/2023  Narrative   LV perfusion is abnormal. Defect 1: There is a medium defect with moderate reduction in uptake present  in the apical to basal inferior location(s) that is reversible. There is abnormal wall motion in the defect area. Consistent with ischemia. The defect is consistent with abnormal perfusion in the RCA territory. Defect 2: There is a medium defect present in the apical lateral and apex location(s). There is abnormal wall motion in the defect area. Consistent with infarction and peri-infarct ischemia. The defect is consistent with abnormal perfusion in the LAD territory.   Rest left ventricular function is abnormal. Rest global function is moderately reduced. There was a single regional abnormality. Rest EF:  41%. Stress left ventricular function is abnormal. Stress global function is mildly reduced. There was a single regional abnormality. Stress EF: 51%. End diastolic cavity size is mildly enlarged. End systolic cavity size is mildly enlarged.   Myocardial blood flow was computed to be 0.94ml/g/min at rest and 1.04ml/g/min at stress. Global myocardial blood flow reserve was 1.33 and was abnormal.   Coronary calcium  assessment not performed due to prior revascularization. Due to multiple prior stents   Findings are consistent with infarction with peri-infarct ischemia. The study is intermediate risk.  CLINICAL DATA:  This over-read does not include interpretation of cardiac or coronary anatomy or pathology. No interpretation the PET data set. The cardiac PET-CT interpretation by the cardiologist is attached.  COMPARISON:  None Available.  FINDINGS: Limited view of the lung parenchyma demonstrates 5 mm LEFT lobe pulmonary nodule (image 56/3) adjacent to a band of benign atelectasis. Airways are normal.  Limited view of the mediastinum demonstrates no adenopathy. Esophagus normal.  Limited view of the upper abdomen unremarkable.  Limited view of the skeleton and chest wall is unremarkable.  IMPRESSION: LEFT lobe pulmonary nodule. No follow-up needed if patient is low-risk.This recommendation follows the consensus statement: Guidelines for Management of Incidental Pulmonary Nodules Detected on CT Images: From the Fleischner Society 2017; Radiology 2017; 284:228-243.   Electronically Signed By: Deboraha Fallow M.D. On: 09/29/2023 13:34   ECHOCARDIOGRAM  ECHOCARDIOGRAM COMPLETE 09/18/2023  Narrative ECHOCARDIOGRAM REPORT    Patient Name:   [name] [facility] Date of Exam: 09/18/2023 Medical Rec #:  [id number]       Height:       74.0 in Accession #:    [id number]      Weight:       264.6 lb Date of Birth:  [birth year]       BSA:          2.448 m Patient Age:    78 years        BP:            110/58 mmHg Patient Gender: M               HR:           59 bpm. Exam Location:  Outpatient  Procedure: 2D Echo, 3D Echo, Cardiac Doppler, Color Doppler and Intracardiac Opacification Agent (Both Spectral and Color Flow Doppler were utilized during procedure).  Indications:    R07.2 Precordial pain; R06.9 DOE; R60.0 Lower extremity edema  History:        Patient has prior history of Echocardiogram examinations, most recent 10/14/2021. CHF, Previous Myocardial Infarction and CAD, Leadless pacer, Arrythmias:LBBB and 2nd degree AV Block, Signs/Symptoms:Edema, Dyspnea and Chest Pain; Risk Factors:Former Smoker, Hypertension, Dyslipidemia, Sleep Apnea and CKD stage 3a. Patient has had chest pain, DOE and leg edema.  Sonographer:    Richarda Chance RVT, RDCS (AE), RDMS Referring Phys: [id number] KENNETH C HILTY   Sonographer Comments:  Suboptimal parasternal window, suboptimal apical window, no subcostal window and patient is obese. Image acquisition challenging due to patient body habitus. IMPRESSIONS   1. The Micra AV leadless pacemaker not see on these images implant in RV apex. 2. Septal/apical, inferior apical hypokinesis Swirling artifact in apex with definity  do not think he has thrombus . Left ventricular ejection fraction, by estimation, is 45 to 50%. The left ventricle has mildly decreased function. The left ventricle demonstrates regional wall motion abnormalities (see scoring diagram/findings for description). The left ventricular internal cavity size was mildly dilated. There is mild left ventricular hypertrophy. Left ventricular diastolic parameters were normal. 3. Right ventricular systolic function is normal. The right ventricular size is normal. 4. Left atrial size was mildly dilated. 5. Some splay artifact on color doppler of MR signal . The mitral valve is abnormal. Mild mitral valve regurgitation. No evidence of mitral stenosis. 6. The aortic valve is tricuspid. There is  moderate calcification of the aortic valve. There is moderate thickening of the aortic valve. Aortic valve regurgitation is trivial. No aortic stenosis is present. 7. The inferior vena cava is normal in size with greater than 50% respiratory variability, suggesting right atrial pressure of 3 mmHg.  Comparison(s): EF 45%, moderate LVH, apical hypokinesis, paradoxical septal motion,.  FINDINGS Left Ventricle: Septal/apical, inferior apical hypokinesis Swirling artifact in apex with definity  do not think he has thrombus. Left ventricular ejection fraction, by estimation, is 45 to 50%. The left ventricle has mildly decreased function. The left ventricle demonstrates regional wall motion abnormalities. Definity  contrast agent was given IV to delineate the left ventricular endocardial borders. Strain was performed and the global longitudinal strain is indeterminate. The left ventricular internal cavity size was mildly dilated. There is mild left ventricular hypertrophy. Left ventricular diastolic parameters were normal.  Right Ventricle: The right ventricular size is normal. No increase in right ventricular wall thickness. Right ventricular systolic function is normal.  Left Atrium: Left atrial size was mildly dilated.  Right Atrium: Right atrial size was normal in size.  Pericardium: There is no evidence of pericardial effusion.  Mitral Valve: Some splay artifact on color doppler of MR signal. The mitral valve is abnormal. There is mild thickening of the mitral valve leaflet(s). There is mild calcification of the mitral valve leaflet(s). Mild mitral annular calcification. Mild mitral valve regurgitation. No evidence of mitral valve stenosis.  Tricuspid Valve: The tricuspid valve is normal in structure. Tricuspid valve regurgitation is trivial. No evidence of tricuspid stenosis.  Aortic Valve: The aortic valve is tricuspid. There is moderate calcification of the aortic valve. There is moderate  thickening of the aortic valve. Aortic valve regurgitation is trivial. No aortic stenosis is present. Aortic valve mean gradient measures 3.0 mmHg. Aortic valve peak gradient measures 6.2 mmHg. Aortic valve area, by VTI measures 2.11 cm.  Pulmonic Valve: The pulmonic valve was normal in structure. Pulmonic valve regurgitation is not visualized. No evidence of pulmonic stenosis.  Aorta: The aortic root is normal in size and structure.  Venous: The inferior vena cava is normal in size with greater than 50% respiratory variability, suggesting right atrial pressure of 3 mmHg.  IAS/Shunts: No atrial level shunt detected by color flow Doppler.  Additional Comments: The Micra AV leadless pacemaker not see on these images implant in RV apex. 3D was performed not requiring image post processing on an independent workstation and was abnormal.   LEFT VENTRICLE PLAX 2D LVIDd:         6.63 cm LVIDs:  4.76 cm LV PW:         1.26 cm LV IVS:        1.11 cm LVOT diam:     2.00 cm   3D Volume EF: LV SV:         50        3D EF:        53 % LV SV Index:   21        LV EDV:       148 ml LVOT Area:     3.14 cm  LV ESV:       69 ml LV SV:        79 ml  RIGHT VENTRICLE RV S prime:     14.40 cm/s TAPSE (M-mode): 1.5 cm  LEFT ATRIUM              Index        RIGHT ATRIUM           Index LA diam:        4.20 cm  1.72 cm/m   RA Area:     22.50 cm LA Vol (A2C):   101.0 ml 41.26 ml/m  RA Volume:   72.70 ml  29.70 ml/m LA Vol (A4C):   119.0 ml 48.61 ml/m LA Biplane Vol: 110.0 ml 44.93 ml/m AORTIC VALVE                    PULMONIC VALVE AV Area (Vmax):    2.08 cm     PV Vmax:       0.90 m/s AV Area (Vmean):   1.91 cm     PV Peak grad:  3.2 mmHg AV Area (VTI):     2.11 cm AV Vmax:           125.00 cm/s AV Vmean:          82.700 cm/s AV VTI:            0.238 m AV Peak Grad:      6.2 mmHg AV Mean Grad:      3.0 mmHg LVOT Vmax:         82.90 cm/s LVOT Vmean:        50.200 cm/s LVOT VTI:           0.160 m LVOT/AV VTI ratio: 0.67 AR Vena Contracta: 0.22 cm  AORTA Ao Root diam: 3.50 cm Ao Asc diam:  3.30 cm Ao Arch diam: 3.2 cm  MITRAL VALVE               TRICUSPID VALVE MV Area (PHT): 2.64 cm    TR Peak grad:   13.8 mmHg MV Decel Time: 287 msec    TR Vmax:        186.00 cm/s MV E velocity: 83.30 cm/s MV A velocity: 81.20 cm/s  SHUNTS MV E/A ratio:  1.03        Systemic VTI:  0.16 m Systemic Diam: 2.00 cm  Janelle Mediate MD Electronically signed by Janelle Mediate MD Signature Date/Time: 09/18/2023/12:27:05 PM    Final          ______________________________________________________________________________________________      Risk Assessment/Calculations:             Physical Exam:   VS:  BP 132/70   Pulse 62   Ht 6\' 2"  (1.88 m)   Wt 263 lb 12.8 oz (119.7 kg)   SpO2 97%   BMI 33.87 kg/m  Wt Readings from Last 3 Encounters:  10/02/23 263 lb 12.8 oz (119.7 kg)  09/01/23 264 lb 9.6 oz (120 kg)  08/04/23 261 lb 9.6 oz (118.7 kg)    GEN: Well nourished, well developed in no acute distress NECK: No JVD; No carotid bruits CARDIAC: RRR, no murmurs, rubs, gallops RESPIRATORY:  Clear to auscultation without rales, wheezing or rhonchi  ABDOMEN: Soft, non-tender, non-distended EXTREMITIES:  No edema; No deformity   ASSESSMENT AND PLAN: .    Assessment & Plan Coronary artery disease / HLD, LDL goal <70 PET scan with peri infarct ischemia in RCA territory, correlating with present exertional chest pain and dyspnea. Cardiac catheterization planned to assess and potentially intervene on coronary arteries, particularly right coronary artery. - Schedule cardiac catheterization for October 12, 2023 (date per his preference due to work schedule, he will contact us  if symptoms worsen since that time) - Perform pre-procedure labs BMET, CBC - GDMT Aspirin  81mg  daily, Atorvastatin  40mg  daily, Imdur  30mg  daily.   HFmrEF Symptoms consistent with heart failure, including  exertional dyspnea and orthopnea. Reduced ejection fraction indicates systolic dysfunction. Manages fluid retention with furosemide , occasionally doubling dose, indicating intermittent volume overload. - Continue furosemide  40mg  BID, Spironolactone  25mg  daily, Imdur  30mg  daily.  -Careful titration of diuretic and antihypertensive given CKD - Monitor potassium levels and adjust supplementation accordingly.       Informed Consent   Shared Decision Making/Informed Consent The risks [stroke (1 in 1000), death (1 in 1000), kidney failure [usually temporary] (1 in 500), bleeding (1 in 200), allergic reaction [possibly serious] (1 in 200)], benefits (diagnostic support and management of coronary artery disease) and alternatives of a cardiac catheterization were discussed in detail with [name] and he is willing to proceed.     Dispo: follow up 2-3 weeks after [facility]  Signed, Clearnce Curia, NP

## 2023-10-02 NOTE — Patient Instructions (Signed)
 Medication Instructions:  Your physician recommends that you continue on your current medications as directed. Please refer to the Current Medication list given to you today.  *If you need a refill on your cardiac medications before your next appointment, please call your pharmacy*  Lab Work: Your physician recommends that you return for lab work today- BMP & CBC   Testing/Procedures:  You are scheduled for a Cardiac Catheterization on Monday, April 28 with Dr. Lonni End.  1. Please arrive at the [facility] (Main Entrance A) at [facility]: [address] [place], [place] [address] at 7:00 AM (This time is 2 hour(s) before your procedure to ensure your preparation).   Free valet parking service is available. You will check in at ADMITTING. The support person will be asked to wait in the waiting room.  It is OK to have someone drop you off and come back when you are ready to be discharged.    Special note: Every effort is made to have your procedure done on time. Please understand that emergencies sometimes delay scheduled procedures.  2. Diet: Do not eat solid foods after midnight.  The patient may have clear liquids until 5am upon the day of the procedure.  3. Labs: Done at [facility]- BMP, CBC   4. Medication instructions in preparation for your procedure:  On the morning of your procedure, take your Aspirin  81 mg and any morning medicines NOT listed above.  You may use sips of water.  5. Plan to go home the same day, you will only stay overnight if medically necessary. 6. Bring a current list of your medications and current insurance cards. 7. You MUST have a responsible person to drive you home. 8. Someone MUST be with you the first 24 hours after you arrive home or your discharge will be delayed. 9. Please wear clothes that are easy to get on and off and wear slip-on shoes.  Thank you for allowing us  to care for you!   -- [facility]  [facility]   Follow-Up: At [facility], you and your health needs are our priority.  As part of our continuing mission to provide you with exceptional heart care, our providers are all part of one team.  This team includes your primary Cardiologist (physician) and Advanced Practice Providers or APPs (Physician Assistants and Nurse Practitioners) who all work together to provide you with the care you need, when you need it.  Your next appointment:   2 week(s) post cath with Dr. Mona or Reche Finder, NP   We recommend signing up for the patient portal called MyChart.  Sign up information is provided on this After Visit Summary.  MyChart is used to connect with patients for Virtual Visits (Telemedicine).  Patients are able to view lab/test results, encounter notes, upcoming appointments, etc.  Non-urgent messages can be sent to your provider as well.   To learn more about what you can do with MyChart, go to [email or web address].   Other Instructions If chest pain gets worse before your cath, please call us !

## 2023-10-03 LAB — BASIC METABOLIC PANEL WITH GFR
BUN/Creatinine Ratio: 18 (ref 10–24)
BUN: 27 mg/dL (ref 8–27)
CO2: 23 mmol/L (ref 20–29)
Calcium: 10.6 mg/dL — ABNORMAL HIGH (ref 8.6–10.2)
Chloride: 104 mmol/L (ref 96–106)
Creatinine, Ser: 1.5 mg/dL — ABNORMAL HIGH (ref 0.76–1.27)
Glucose: 83 mg/dL (ref 70–99)
Potassium: 4.1 mmol/L (ref 3.5–5.2)
Sodium: 141 mmol/L (ref 134–144)
eGFR: 47 mL/min/{1.73_m2} — ABNORMAL LOW (ref >59)

## 2023-10-03 LAB — CBC
Hematocrit: 43.7 % (ref 37.5–51.0)
Hemoglobin: 14.5 g/dL (ref 13.0–17.7)
MCH: 30.1 pg (ref 26.6–33.0)
MCHC: 33.2 g/dL (ref 31.5–35.7)
MCV: 91 fL (ref 79–97)
Platelets: 225 10*3/uL (ref 150–450)
RBC: 4.82 x10E6/uL (ref 4.14–5.80)
RDW: 12.2 % (ref 11.6–15.4)
WBC: 9.7 10*3/uL (ref 3.4–10.8)

## 2023-10-04 ENCOUNTER — Encounter (HOSPITAL_BASED_OUTPATIENT_CLINIC_OR_DEPARTMENT_OTHER): Payer: Self-pay | Admitting: Family

## 2023-10-04 ENCOUNTER — Encounter (HOSPITAL_BASED_OUTPATIENT_CLINIC_OR_DEPARTMENT_OTHER): Payer: Self-pay

## 2023-10-08 ENCOUNTER — Telehealth: Payer: Self-pay | Admitting: *Deleted

## 2023-10-08 NOTE — Telephone Encounter (Signed)
 Cardiac Catheterization scheduled at [facility] for: Monday October 12, 2023 9 AM Arrival time [facility] Main Entrance A at: 7 AM  Nothing to eat after midnight prior to procedure, clear liquids until 5 AM day of procedure.  Medication instructions -Hold:  Spironolactone  /Lasix -day before and day procedure -per protocol GFR < 60 (47) -Other usual morning medications can be taken with sips of water including aspirin  81 mg.  Plan to go home the same day, you will only stay overnight if medically necessary.  You must have responsible adult to drive you home.  Someone must be with you the first 24 hours after you arrive home.  Reviewed procedure instructions with patient.

## 2023-10-12 ENCOUNTER — Other Ambulatory Visit: Payer: Self-pay

## 2023-10-12 ENCOUNTER — Encounter (HOSPITAL_COMMUNITY)
Admission: RE | Disposition: A | Discharge status: Home / Self Care | Payer: Self-pay | Source: Home / Self Care | Attending: Internal Medicine

## 2023-10-12 ENCOUNTER — Encounter (HOSPITAL_COMMUNITY): Payer: Self-pay | Admitting: Internal Medicine

## 2023-10-12 ENCOUNTER — Ambulatory Visit (HOSPITAL_COMMUNITY)
Admission: RE | Admit: 2023-10-12 | Discharge: 2023-10-12 | Disposition: A | Discharge status: Home / Self Care | Attending: Internal Medicine | Admitting: Internal Medicine

## 2023-10-12 DIAGNOSIS — I2582 Chronic total occlusion of coronary artery: Secondary | ICD-10-CM | POA: Insufficient documentation

## 2023-10-12 DIAGNOSIS — E785 Hyperlipidemia, unspecified: Secondary | ICD-10-CM | POA: Insufficient documentation

## 2023-10-12 DIAGNOSIS — Z7982 Long term (current) use of aspirin: Secondary | ICD-10-CM | POA: Insufficient documentation

## 2023-10-12 DIAGNOSIS — J45909 Unspecified asthma, uncomplicated: Secondary | ICD-10-CM | POA: Insufficient documentation

## 2023-10-12 DIAGNOSIS — Z950 Presence of cardiac pacemaker: Secondary | ICD-10-CM | POA: Diagnosis not present

## 2023-10-12 DIAGNOSIS — I2510 Atherosclerotic heart disease of native coronary artery without angina pectoris: Secondary | ICD-10-CM | POA: Diagnosis not present

## 2023-10-12 DIAGNOSIS — N1831 Chronic kidney disease, stage 3a: Secondary | ICD-10-CM | POA: Insufficient documentation

## 2023-10-12 DIAGNOSIS — I25119 Atherosclerotic heart disease of native coronary artery with unspecified angina pectoris: Secondary | ICD-10-CM | POA: Diagnosis not present

## 2023-10-12 DIAGNOSIS — G4733 Obstructive sleep apnea (adult) (pediatric): Secondary | ICD-10-CM | POA: Diagnosis not present

## 2023-10-12 DIAGNOSIS — Z79899 Other long term (current) drug therapy: Secondary | ICD-10-CM | POA: Insufficient documentation

## 2023-10-12 DIAGNOSIS — I5022 Chronic systolic (congestive) heart failure: Secondary | ICD-10-CM | POA: Insufficient documentation

## 2023-10-12 DIAGNOSIS — I130 Hypertensive heart and chronic kidney disease with heart failure and stage 1 through stage 4 chronic kidney disease, or unspecified chronic kidney disease: Secondary | ICD-10-CM | POA: Diagnosis not present

## 2023-10-12 DIAGNOSIS — I25110 Atherosclerotic heart disease of native coronary artery with unstable angina pectoris: Secondary | ICD-10-CM

## 2023-10-12 DIAGNOSIS — Z955 Presence of coronary angioplasty implant and graft: Secondary | ICD-10-CM | POA: Insufficient documentation

## 2023-10-12 HISTORY — PX: LEFT HEART CATH AND CORONARY ANGIOGRAPHY: CATH118249

## 2023-10-12 LAB — BASIC METABOLIC PANEL WITH GFR
Anion gap: 10 (ref 5–15)
BUN: 32 mg/dL — ABNORMAL HIGH (ref 8–23)
CO2: 22 mmol/L (ref 22–32)
Calcium: 10 mg/dL (ref 8.9–10.3)
Chloride: 107 mmol/L (ref 98–111)
Creatinine, Ser: 1.45 mg/dL — ABNORMAL HIGH (ref 0.61–1.24)
GFR, Estimated: 49 mL/min — ABNORMAL LOW (ref >60)
Glucose, Bld: 100 mg/dL — ABNORMAL HIGH (ref 70–99)
Potassium: 3.6 mmol/L (ref 3.5–5.1)
Sodium: 139 mmol/L (ref 135–145)

## 2023-10-12 SURGERY — LEFT HEART CATH AND CORONARY ANGIOGRAPHY
Anesthesia: LOCAL

## 2023-10-12 MED ORDER — MIDAZOLAM HCL 2 MG/2ML IJ SOLN
INTRAMUSCULAR | Status: DC | PRN
  Administered 2023-10-12 (×2): .5 mg via INTRAVENOUS

## 2023-10-12 MED ORDER — FENTANYL CITRATE (PF) 100 MCG/2ML IJ SOLN
INTRAMUSCULAR | Status: AC
  Filled 2023-10-12: qty 2

## 2023-10-12 MED ORDER — HEPARIN (PORCINE) IN NACL 1000-0.9 UT/500ML-% IV SOLN
INTRAVENOUS | Status: DC | PRN
  Administered 2023-10-12 (×2): 500 mL

## 2023-10-12 MED ORDER — HEPARIN SODIUM (PORCINE) 1000 UNIT/ML IJ SOLN
INTRAMUSCULAR | Status: AC
  Filled 2023-10-12: qty 10

## 2023-10-12 MED ORDER — LIDOCAINE HCL (PF) 1 % IJ SOLN
INTRAMUSCULAR | Status: DC | PRN
  Administered 2023-10-12: 2 mL

## 2023-10-12 MED ORDER — FENTANYL CITRATE (PF) 100 MCG/2ML IJ SOLN
INTRAMUSCULAR | Status: DC | PRN
  Administered 2023-10-12 (×2): 12.5 ug via INTRAVENOUS

## 2023-10-12 MED ORDER — SODIUM CHLORIDE 0.9 % IV SOLN: INTRAVENOUS | Status: DC

## 2023-10-12 MED ORDER — HEPARIN SODIUM (PORCINE) 1000 UNIT/ML IJ SOLN
INTRAMUSCULAR | Status: DC | PRN
  Administered 2023-10-12: 5000 [IU] via INTRAVENOUS

## 2023-10-12 MED ORDER — LIDOCAINE HCL (PF) 1 % IJ SOLN
INTRAMUSCULAR | Status: AC
  Filled 2023-10-12: qty 30

## 2023-10-12 MED ORDER — VERAPAMIL HCL 2.5 MG/ML IV SOLN
INTRAVENOUS | Status: DC | PRN
  Administered 2023-10-12: 10 mL via INTRA_ARTERIAL

## 2023-10-12 MED ORDER — IOHEXOL 350 MG/ML SOLN
INTRAVENOUS | Status: DC | PRN
  Administered 2023-10-12: 28 mL

## 2023-10-12 MED ORDER — ASPIRIN 81 MG PO CHEW: 81.0000 mg | CHEWABLE_TABLET | ORAL | Status: DC

## 2023-10-12 MED ORDER — ISOSORBIDE MONONITRATE ER 30 MG PO TB24: 60.0000 mg | ORAL_TABLET | Freq: Every day | ORAL | Status: DC

## 2023-10-12 MED ORDER — MIDAZOLAM HCL 2 MG/2ML IJ SOLN
INTRAMUSCULAR | Status: AC
  Filled 2023-10-12: qty 2

## 2023-10-12 MED ORDER — VERAPAMIL HCL 2.5 MG/ML IV SOLN
INTRAVENOUS | Status: AC
  Filled 2023-10-12: qty 2

## 2023-10-12 SURGICAL SUPPLY — 7 items
CATH 5FR JL3.5 JR4 ANG PIG MP (CATHETERS) IMPLANT
DEVICE RAD COMP TR BAND LRG (VASCULAR PRODUCTS) IMPLANT
GLIDESHEATH SLEND SS 6F .021 (SHEATH) IMPLANT
GUIDEWIRE INQWIRE 1.5J.035X260 (WIRE) IMPLANT
PACK CARDIAC CATHETERIZATION (CUSTOM PROCEDURE TRAY) ×1 IMPLANT
SET ATX-X65L (MISCELLANEOUS) IMPLANT
STATION PROTECTION PRESSURIZED (MISCELLANEOUS) IMPLANT

## 2023-10-12 NOTE — Discharge Instructions (Signed)

## 2023-10-12 NOTE — Interval H&P Note (Signed)
 History and Physical Interval Note:  10/12/2023 8:38 AM  [name]  has presented today for surgery, with the diagnosis of coronary artery disease with accelerating angina and abnormal myocardial PET/CT.  The various methods of treatment have been discussed with the patient and family. After consideration of risks, benefits and other options for treatment, the patient has consented to  Procedure(s): LEFT HEART CATH AND CORONARY ANGIOGRAPHY (N/A) as a surgical intervention.  The patient's history has been reviewed, patient examined, no change in status, stable for surgery.  I have reviewed the patient's chart and labs.  Questions were answered to the patient's satisfaction.    Cath Lab Visit (complete for each Cath Lab visit)  Clinical Evaluation Leading to the Procedure:   ACS: No.  Non-ACS:    Anginal Classification: CCS III  Anti-ischemic medical therapy: Minimal Therapy (1 class of medications)  Non-Invasive Test Results: Intermediate-risk stress test findings: cardiac mortality 1-3%/year  Prior CABG: No previous CABG  [employer]

## 2023-10-15 ENCOUNTER — Other Ambulatory Visit (HOSPITAL_COMMUNITY): Payer: Self-pay

## 2023-10-23 DIAGNOSIS — I771 Stricture of artery: Secondary | ICD-10-CM | POA: Diagnosis not present

## 2023-10-23 DIAGNOSIS — I700 Atherosclerosis of aorta: Secondary | ICD-10-CM | POA: Diagnosis not present

## 2023-10-23 DIAGNOSIS — R0602 Shortness of breath: Secondary | ICD-10-CM | POA: Diagnosis not present

## 2023-10-26 ENCOUNTER — Ambulatory Visit (HOSPITAL_BASED_OUTPATIENT_CLINIC_OR_DEPARTMENT_OTHER): Admitting: Family

## 2023-10-26 NOTE — Progress Notes (Deleted)
 Cardiology Office Note:  .   Date:  10/26/2023  ID:  [name], DOB 24-Jul-1945, MRN [id number] PCP: Rae Bugler, MD  [facility] Providers Cardiologist:  Hazle Lites, MD Electrophysiologist:  Efraim Grange, MD    History of Present Illness: .   [name] is a 78 y.o. male with hx of CAD (s/p stent in LAD, RCA, LCx), HLD, OSA on BIPAP, CKD3a, second degree type 1 AVB s/p leadless PPM, HFrEF, asthma.  Seen by Dr. Maximo Spar 09/01/23 with progressive chest pressure, dyspnea with exertion. Echo 09/18/23 LVEF 45-50%, apical hypokinesis, LV swirling with risk for thrombus (no clear LV apical thrombus). 09/29/23 cardiac PET with two defects one nonreversible in area of known occluded distal Cx and another consistent with peri-infarct ischemia in territory of RCA, rest LVEF 41% and stress LVEF 51%,   Last seen 10/02/23 noting episodes of exertional dyspnea.  Abnormal cardiac PET reviewed.  Subsequently underwent LHC/28/25 with severe two-vessel CAD with chronic subtotal/total occlusion of distal LCx/OM 3 and RPDA.  Nonobstructive LAD disease with patent stent.  Recommended for continued medical therapy.  Imdur  increased to 60 mg daily.  RPDA and mid/distal LCx disease progressed from prior catheterization though not well-suited for PCI.  Discussed the use of AI scribe software for clinical note transcription with the patient, who gave verbal consent to proceed.  History of Present Illness Mr. [name], a patient with a history of heart disease, presents with chest pain and shortness of breath. Granddaughter present today as well. The symptoms are particularly noticeable when walking or working, but do not cause nausea or sweating. The patient reports that he has been sleeping in a recliner for the past two years, initially due to a need for BIPAP but no longer using BIPAP. He denies experiencing shortness of breath when sitting still or lying down.  In addition to his cardiac symptoms,  Mr. [name] is the primary caregiver for his wife, who has Alzheimer's disease. 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 Despite the physical demands of his job, which involves moving and lifting heavy objects, Mr. [name] enjoys his work and does not wish to quit. We reviewed findings  on cardiac PET which  suggest a need for further intervention.***   ROS: Please see the history of present illness.    All other systems reviewed and are negative.   Studies Reviewed: .           Risk Assessment/Calculations:     No BP recorded.  {Refresh Note OR Click here to enter BP  :1}***       Physical Exam:   VS:  There were no vitals taken for this visit.   Wt Readings from Last 3 Encounters:  10/12/23 262 lb (118.8 kg)  10/02/23 263 lb 12.8 oz (119.7 kg)  09/01/23 264 lb 9.6 oz (120 kg)    GEN: Well nourished, well developed in no acute distress NECK: No JVD; No carotid bruits CARDIAC: RRR, no murmurs, rubs, gallops RESPIRATORY:  Clear to auscultation without rales, wheezing or rhonchi  ABDOMEN: Soft, non-tender, non-distended EXTREMITIES:  No edema; No deformity   ASSESSMENT AND PLAN: .    Assessment & Plan Coronary artery disease / HLD, LDL goal <70 PET scan with peri infarct ischemia in RCA territory, correlating with present exertional chest pain and dyspnea. Cardiac catheterization planned to assess and potentially intervene on coronary arteries, particularly right coronary artery. - Schedule cardiac  catheterization for October 12, 2023 (date per his preference due to work schedule, he will contact us  if symptoms worsen since that time) - Perform pre-procedure labs BMET, CBC - GDMT Aspirin  81mg  daily, Atorvastatin  40mg  daily, Imdur  30mg  daily. ***  HFmrEF Symptoms consistent with heart failure, including exertional dyspnea and orthopnea. Reduced ejection fraction indicates systolic dysfunction. Manages fluid retention  with furosemide , occasionally doubling dose, indicating intermittent volume overload. - Continue furosemide  40mg  BID, Spironolactone  25mg  daily, Imdur  30mg  daily.  -Careful titration of diuretic and antihypertensive given CKD - Monitor potassium levels and adjust supplementation accordingly.***      Dispo: follow up ***  Signed, Clearnce Curia, NP

## 2023-10-28 ENCOUNTER — Ambulatory Visit: Payer: Medicare HMO | Admitting: Internal Medicine

## 2023-11-14 ENCOUNTER — Other Ambulatory Visit (HOSPITAL_COMMUNITY): Payer: Self-pay

## 2023-11-14 ENCOUNTER — Other Ambulatory Visit: Payer: Self-pay

## 2023-11-14 DIAGNOSIS — C61 Malignant neoplasm of prostate: Secondary | ICD-10-CM | POA: Diagnosis not present

## 2023-11-14 DIAGNOSIS — I25118 Atherosclerotic heart disease of native coronary artery with other forms of angina pectoris: Secondary | ICD-10-CM | POA: Diagnosis not present

## 2023-11-14 DIAGNOSIS — I5042 Chronic combined systolic (congestive) and diastolic (congestive) heart failure: Secondary | ICD-10-CM | POA: Diagnosis not present

## 2023-11-14 DIAGNOSIS — E782 Mixed hyperlipidemia: Secondary | ICD-10-CM | POA: Diagnosis not present

## 2023-11-14 MED ORDER — ATORVASTATIN CALCIUM 40 MG PO TABS
40.0000 mg | ORAL_TABLET | ORAL | 10 refills | Status: AC
  Filled 2023-11-14: qty 8, 28d supply, fill #0
  Filled 2024-01-05: qty 8, 28d supply, fill #1

## 2023-11-14 MED ORDER — FUROSEMIDE 40 MG PO TABS
40.0000 mg | ORAL_TABLET | Freq: Two times a day (BID) | ORAL | 10 refills | Status: DC
  Filled 2023-11-14: qty 60, 30d supply, fill #0
  Filled 2024-01-05: qty 60, 30d supply, fill #1
  Filled 2024-02-11: qty 60, 30d supply, fill #2
  Filled 2024-03-22: qty 60, 30d supply, fill #3
  Filled 2024-04-25: qty 60, 30d supply, fill #4
  Filled 2024-05-26: qty 60, 30d supply, fill #5

## 2023-11-14 MED ORDER — ASPIRIN 81 MG PO TBEC
81.0000 mg | DELAYED_RELEASE_TABLET | Freq: Every day | ORAL | 3 refills | Status: AC
  Filled 2023-11-14: qty 90, 90d supply, fill #0

## 2023-11-14 MED ORDER — PANTOPRAZOLE SODIUM 40 MG PO TBEC
40.0000 mg | DELAYED_RELEASE_TABLET | Freq: Every day | ORAL | 13 refills | Status: DC
  Filled 2023-11-14: qty 100, 100d supply, fill #0

## 2023-11-14 MED ORDER — SPIRONOLACTONE 25 MG PO TABS
25.0000 mg | ORAL_TABLET | Freq: Every day | ORAL | 3 refills | Status: AC
  Filled 2023-11-14: qty 90, 90d supply, fill #0
  Filled 2024-02-26: qty 90, 90d supply, fill #1

## 2023-11-14 MED ORDER — POTASSIUM CHLORIDE CRYS ER 20 MEQ PO TBCR
20.0000 meq | EXTENDED_RELEASE_TABLET | Freq: Two times a day (BID) | ORAL | 3 refills | Status: AC
  Filled 2023-11-14: qty 180, 90d supply, fill #0

## 2023-11-14 MED ORDER — SACUBITRIL-VALSARTAN 24-26 MG PO TABS
1.0000 | ORAL_TABLET | Freq: Two times a day (BID) | ORAL | 5 refills | Status: AC
  Filled 2023-11-14: qty 60, 30d supply, fill #0

## 2023-11-14 MED ORDER — ISOSORBIDE MONONITRATE ER 30 MG PO TB24
30.0000 mg | ORAL_TABLET | Freq: Every day | ORAL | 6 refills | Status: DC
  Filled 2023-11-14: qty 30, 30d supply, fill #0

## 2023-11-16 ENCOUNTER — Other Ambulatory Visit (HOSPITAL_COMMUNITY): Payer: Self-pay

## 2023-11-16 ENCOUNTER — Other Ambulatory Visit: Payer: Self-pay

## 2023-11-17 ENCOUNTER — Other Ambulatory Visit: Payer: Self-pay

## 2023-11-17 ENCOUNTER — Other Ambulatory Visit (HOSPITAL_COMMUNITY): Payer: Self-pay

## 2023-11-25 NOTE — Progress Notes (Addendum)
 Cardiology Clinic Note   Patient Name: [name] Date of Encounter: 11/27/2023  Primary Care Provider:  Rae Bugler, MD Primary Cardiologist:  Hazle Lites, MD  Patient Profile    [name] 78 year old male presents the clinic today for follow-up evaluation of his coronary artery disease and hypertension.  Past Medical History    Past Medical History:  Diagnosis Date   Arthritis    left shoulder (02/09/2015)   Blind left eye    CAD (coronary artery disease)    Closed fracture of lateral portion of right tibial plateau 11/01/2017   Complication of anesthesia    they give me too much anesthesia & had to put me on life support for a little bit w/gallbladder OR   Coronary artery disease    Former smoker    GERD (gastroesophageal reflux disease)    Gout    History of bleeding ulcers    History of gout    History of peptic ulcer disease    Hyperlipidemia    Hypertension    Hypertensive heart disease without CHF    Kidney stones    Lumbar disc disease    Myocardial infarction (HCC) 1995; 2007   Obesity    OSA (obstructive sleep apnea)    Pneumonia X 2   Prostate cancer (HCC)    Pulmonary nodule    Second degree Mobitz I AV block    Past Surgical History:  Procedure Laterality Date   APPENDECTOMY     APPENDECTOMY  ~ 1985   BACK SURGERY     CARDIAC CATHETERIZATION N/A 02/08/2015   Procedure: Left Heart Cath and Coronary Angiography;  Surgeon: Arleen Lacer, MD;  Location: [facility] INVASIVE CV LAB;  Service: Cardiovascular;  Laterality: N/A;   CARDIAC CATHETERIZATION N/A 02/08/2015   Procedure: Coronary Stent Intervention;  Surgeon: Arleen Lacer, MD;  Location: [facility] INVASIVE CV LAB;  Service: Cardiovascular;  Laterality: N/A;   CHOLECYSTECTOMY     CORONARY ANGIOPLASTY WITH STENT PLACEMENT  ~ 1995; 2007   1 stent; 2 stents   LAPAROSCOPIC CHOLECYSTECTOMY     LEFT HEART CATH AND CORONARY ANGIOGRAPHY N/A 07/13/2018   Procedure: LEFT HEART CATH AND  CORONARY ANGIOGRAPHY;  Surgeon: Swaziland, Peter M, MD;  Location: [facility] INVASIVE CV LAB;  Service: Cardiovascular;  Laterality: N/A;   LEFT HEART CATH AND CORONARY ANGIOGRAPHY N/A 10/12/2023   Procedure: LEFT HEART CATH AND CORONARY ANGIOGRAPHY;  Surgeon: Sammy Crisp, MD;  Location: [facility] INVASIVE CV LAB;  Service: Cardiovascular;  Laterality: N/A;   LITHOTRIPSY  X 1   LUMBAR LAMINECTOMY     ORIF TIBIA PLATEAU Right 11/05/2017   Procedure: OPEN REDUCTION INTERNAL FIXATION (ORIF) TIBIAL PLATEAU;  Surgeon: Laneta Pintos, MD;  Location: [facility] OR;  Service: Orthopedics;  Laterality: Right;   PACEMAKER LEADLESS INSERTION N/A 05/17/2019   Procedure: PACEMAKER LEADLESS INSERTION;  Surgeon: Jolly Needle, MD;  Location: [facility] INVASIVE CV LAB;  Service: Cardiovascular;  Laterality: N/A;   POSTERIOR LUMBAR FUSION  2003?   PROSTATE BIOPSY  2015    Allergies  Allergies  Allergen Reactions   Prednisone  Itching and Swelling   Atorvastatin      leg cramps    History of Present Illness    [name] is a PMH of HTN, HLD, CAD, second-degree AV heart block, OSA on BiPAP, pulmonary nodule, asthma, chronic respiratory failure, GERD, obesity, and generalized abdominal pain, and PPM (1/20).  He underwent cardiac catheterization in 1995, 2007, 2016, and 07/13/2018.  Last cardiac catheterization showed distal RCA 30%, proximal LAD 35%, previously placed proximal circumflex stent widely patent, previously placed mid circumflex stent widely patent, distal circumflex lesion 100% with sidebranch and ostial third marginal, LV systolic function normal, medical management was recommended.  His procedure was complicated by wound Kienbck.  He was evaluated by EP and felt not to be a candidate for pacing.  He developed progressive fatigue and worsening symptoms.  He underwent leadless pacemaker placement without complication and noted improvement in his symptoms.  He was seen in follow-up by Dr. Maximo Spar on 07/25/2020.  He was doing  well at that time.  He was compliant with his BiPAP.  He had been hospitalized in January with acute on chronic CHF.  At that time his LVEF was noted to be 45-50%.  He was placed on furosemide , Aldactone , and Entresto .  He contracted COVID-19.  He was treated with monoclonal antibody therapy 07/17/2020.  He reported he was asymptomatic.  He was planning to go back to work.  His blood pressure was well-controlled.  He was seen in follow-up by Dr. Arlester Ladd on 07/29/2022.  At that time he had no new complaints.  He denied chest pain syncope and presyncope.  He did note occasional episodes of palpitations.  They were not bothersome.  His pacemaker was functioning normally.  He presented to the clinic 08/08/22 for follow-up evaluation and stated he felt well .  He had walked from the parking lot, through the office and to the exam room and denied chest discomfort.  He was working for [employer].  He noted that when he rushed at work he noticed a burning type sensation which she reported felt like heartburn.  The sensation would go away when he slowed his effort.  The sensation would last 2 to 3 minutes.  We reviewed his previous cardiac catheterizations and he denied anginal equivalent symptoms.  He also noted that someone called him and instructed him to stop taking his Entresto .  I reviewed the medication and  did not observe a note from our office.  I will refilled his nitroglycerin , restarted his Entresto , order BMP in 1 to 2 weeks, and planned follow-up in 12 months.  He continue to follow-up regularly with cardiology.  He was seen in follow-up by Neomi Banks, NP on 10/02/2023.  He presented with chest pain and shortness of breath.  His granddaughter accompanied him.  He noted symptoms particularly when he would be walking at work.  He denied nausea or sweating.  He noted that he had been sleeping in a recliner for the past 2 years.  He was no longer using BiPAP.  He denied shortness of  breath when at rest or laying down.  He continued to care for his wife who has Alzheimer's.  He noted that caring for his wife and working part-time at [employer] department made him fatigued.  He continued to enjoy again and did not wish to retire.  His cardiac PET results were reviewed which showed ischemia in the RCA territory.  Cardiac catheterization was recommended.  Benefits and risks of the procedure were reviewed.  His procedure was scheduled for April 28.  He was noted to have severe two-vessel CAD with chronic subtotal/total occlusion of his distal circumflex/OM 3 and RPDA.  He was noted to have nonobstructive LAD disease with patent stent.  His LV filling pressures were mildly elevated at 20 mmHg.  Medical management was recommended and his isosorbide  was increased to 60  mg daily.  Although his RPDA and mid/distal circumflex disease had progressed it was not felt that he had a lesion that was well-suited for PCI.  He presents to the clinic today for follow-up evaluation and states he continues to work 2 weeks/month at [employer].  He enjoys doing this.  We reviewed his medications.  He notes that he has only been taking 30 mg of Imdur .  We reviewed his cardiac catheterization and recommendations for taking 60 mg of Imdur .  He expressed understanding.  I updated his prescription.  He does note some continued reflux type symptoms.  I asked him to reach out to his PCP for clarification of his antiacid medication.  He reports that he was taking a 300 mg pill for this and it was helping.  His blood pressure today is 110/62.  On recheck his heart rate is 60 bpm.  I have asked him to slightly increase the sodium in his diet and increase his p.o. hydration.  We will plan follow-up in 3 to 4 months..  Today he denies chest pain, shortness of breath, lower extremity edema, fatigue, palpitations, melena, hematuria, hemoptysis, diaphoresis, weakness, presyncope, syncope, orthopnea, and  PND.   Home Medications    Prior to Admission medications   Medication Sig Start Date End Date Taking? Authorizing Provider  acetaminophen  (TYLENOL ) 500 MG tablet Take 500 mg by mouth every 6 (six) hours as needed for moderate pain.    [provider]  albuterol  (VENTOLIN  HFA) 108 (90 Base) MCG/ACT inhaler Inhale 1-2 puffs into the lungs every 6 (six) hours as needed for wheezing or shortness of breath. Patient not taking: Reported on 07/29/2022    [provider]  allopurinol  (ZYLOPRIM ) 300 MG tablet Take 300 mg by mouth Daily.  11/07/11   [provider]  amLODipine  (NORVASC ) 10 MG tablet Take 10 mg by mouth Daily.  11/07/11   [provider]  Apple Cider Vinegar 300 MG TABS Take 2 tablets by mouth daily.    [provider]  aspirin  EC 81 MG EC tablet Take 1 tablet (81 mg total) by mouth daily. 02/10/15   Dorsey Gault, MD  atorvastatin  (LIPITOR ) 40 MG tablet TAKE ONE TABLET BY MOUTH ONCE A WEEK 02/04/21   Kroeger, Krista M., PA-C  diphenhydrAMINE  (BENADRYL ) 25 MG tablet Take 50 mg by mouth 2 (two) times daily as needed for allergies.    [provider]  ENTRESTO  24-26 MG TAKE ONE TABLET BY MOUTH TWICE DAILY Patient not taking: Reported on 07/29/2022 01/08/21   Hazle Lites, MD  furosemide  (LASIX ) 40 MG tablet Take 1 tablet (40 mg total) by mouth 2 (two) times daily. PATIENT MUST KEEP SCHEDULED APPOINTMENT FOR FUTURE REFILLS 07/08/22   Hazle Lites, MD  ondansetron  (ZOFRAN ) 8 MG tablet Take 1 tablet (8 mg total) by mouth every 4 (four) hours as needed for nausea. Patient not taking: Reported on 07/29/2022 03/07/22   Mesner, Reymundo Caulk, MD  pantoprazole  (PROTONIX ) 40 MG tablet Take 40 mg by mouth daily. 06/07/18   [provider]  potassium chloride  SA (KLOR-CON  M) 20 MEQ tablet TAKE ONE TABLET BY MOUTH TWICE DAILY 01/20/22   Hilty, Aviva Lemmings, MD  spironolactone  (ALDACTONE ) 25 MG tablet Take 1 tablet (25 mg total) by mouth daily.  07/08/22   Hazle Lites, MD    Family History    Family History  Problem Relation Age of Onset   Hypertension Mother    Coronary artery disease Mother  Aneurysm Father    Hypertension Father    COPD Sister    Sleep apnea Other        3 siblings   He indicated that his mother is deceased. He indicated that his father is deceased. He indicated that the status of his sister is unknown. He indicated that his brother is deceased. He indicated that the status of his other is unknown.  Social History    Social History   Socioeconomic History   Marital status: Married    Spouse name: Not on file   Number of children: 2   Years of education: Not on file   Highest education level: Not on file  Occupational History   Occupation: retired  Tobacco Use   Smoking status: Former    Current packs/day: 0.00    Average packs/day: 3.0 packs/day for 35.0 years (105.0 ttl pk-yrs)    Types: Cigarettes    Start date: 06/16/1948    Quit date: 06/17/1983    Years since quitting: 40.4   Smokeless tobacco: Never   Tobacco comments:    quit smoking cigarettes in 1985  Substance and Sexual Activity   Alcohol use: Not Currently    Comment: I drank a whole lot when I was younger; nothing since 1990   Drug use: No   Sexual activity: Never  Other Topics Concern   Not on file  Social History Narrative   ** Merged History Encounter **       Social Drivers of Corporate investment banker Strain: Not on file  Food Insecurity: Not on file  Transportation Needs: Not on file  Physical Activity: Not on file  Stress: Not on file  Social Connections: Not on file  Intimate Partner Violence: Not on file     Review of Systems    General:  No chills, fever, night sweats or weight changes.  Cardiovascular:  No chest pain, dyspnea on exertion, edema, orthopnea, palpitations, paroxysmal nocturnal dyspnea. Dermatological: No rash, lesions/masses Respiratory: No cough, dyspnea Urologic: No  hematuria, dysuria Abdominal:   No nausea, vomiting, diarrhea, bright red blood per rectum, melena, or hematemesis Neurologic:  No visual changes, wkns, changes in mental status. All other systems reviewed and are otherwise negative except as noted above.  Physical Exam    VS:  BP 110/62   Pulse 60   Ht 6' 2 (1.88 m)   Wt 262 lb 3.2 oz (118.9 kg)   SpO2 94%   BMI 33.66 kg/m  , BMI Body mass index is 33.66 kg/m. GEN: Well nourished, well developed, in no acute distress. HEENT: normal. Neck: Supple, no JVD, carotid bruits, or masses. Cardiac: RRR, no murmurs, rubs, or gallops. No clubbing, cyanosis, edema.  Radials/DP/PT 2+ and equal bilaterally.  Respiratory:  Respirations regular and unlabored, clear to auscultation bilaterally. GI: Soft, nontender, nondistended, BS + x 4. MS: no deformity or atrophy. Skin: warm and dry, no rash. Neuro:  Strength and sensation are intact. Psych: Normal affect.  Accessory Clinical Findings    Recent Labs: 10/02/2023: Hemoglobin 14.5; Platelets 225 10/12/2023: BUN 32; Creatinine, Ser 1.45; Potassium 3.6; Sodium 139   Recent Lipid Panel    Component Value Date/Time   CHOL 154 06/27/2020 0500   TRIG 204 (H) 06/27/2020 0500   HDL 30 (L) 06/27/2020 0500   CHOLHDL 5.1 06/27/2020 0500   VLDL 41 (H) 06/27/2020 0500   LDLCALC 83 06/27/2020 0500         ECG personally reviewed by me today-none  today.  Echocardiogram 10/14/2021  IMPRESSIONS     1. Left ventricular ejection fraction, by estimation, is 45 to 50%. The  left ventricle has mildly decreased function. The left ventricle  demonstrates regional wall motion abnormalities (apical hypokinesis,  paradoxical septal motion). There is moderate  concentric left ventricular hypertrophy. Left ventricular diastolic  parameters are consistent with Grade I diastolic dysfunction (impaired  relaxation).   2. Right ventricular systolic function is normal. The right ventricular  size is dilated.  There is normal pulmonary artery systolic pressure. The  estimated right ventricular systolic pressure is 31.7 mmHg.   3. Left atrial size was moderately dilated.   4. The mitral valve is normal in structure. Mild mitral valve  regurgitation. No evidence of mitral stenosis.   5. The aortic valve is tricuspid. Aortic valve regurgitation is not  visualized. No aortic stenosis is present.   6. The inferior vena cava is normal in size with greater than 50%  respiratory variability, suggesting right atrial pressure of 3 mmHg.   Comparison(s): No significant change from prior study.   FINDINGS   Left Ventricle: Left ventricular ejection fraction, by estimation, is 45  to 50%. The left ventricle has mildly decreased function. The left  ventricle demonstrates regional wall motion abnormalities. The left  ventricular internal cavity size was normal  in size. There is moderate concentric left ventricular hypertrophy.  Abnormal (paradoxical) septal motion, consistent with left bundle branch  block. Left ventricular diastolic parameters are consistent with Grade I  diastolic dysfunction (impaired  relaxation).     LV Wall Scoring:  The entire anterior septum, apical lateral segment, apical anterior  segment,  and apical inferior segment are hypokinetic.   Right Ventricle: The right ventricular size is moderately enlarged. No  increase in right ventricular wall thickness. Right ventricular systolic  function is normal. There is normal pulmonary artery systolic pressure.  The tricuspid regurgitant velocity is  2.68 m/s, and with an assumed right atrial pressure of 3 mmHg, the  estimated right ventricular systolic pressure is 31.7 mmHg.   Left Atrium: Left atrial size was moderately dilated.   Right Atrium: Right atrial size was normal in size.   Pericardium: Trivial pericardial effusion is present. Presence of  epicardial fat layer.   Mitral Valve: The mitral valve is normal in  structure. Mild mitral valve  regurgitation. No evidence of mitral valve stenosis.   Tricuspid Valve: The tricuspid valve is normal in structure. Tricuspid  valve regurgitation is mild . No evidence of tricuspid stenosis.   Aortic Valve: The aortic valve is tricuspid. There is mild aortic valve  annular calcification. Aortic valve regurgitation is not visualized. No  aortic stenosis is present.   Pulmonic Valve: The pulmonic valve was normal in structure. Pulmonic valve  regurgitation is not visualized. No evidence of pulmonic stenosis.   Aorta: The aortic root and ascending aorta are structurally normal, with  no evidence of dilitation.   Venous: The inferior vena cava is normal in size with greater than 50%  respiratory variability, suggesting right atrial pressure of 3 mmHg.   IAS/Shunts: No atrial level shunt detected by color flow Doppler.    Cardiac catheterization 07/13/18 Previously placed Mid RCA stent (unknown type) is widely patent. Dist RCA lesion is 30% stenosed. Prox LAD lesion is 35% stenosed. Previously placed Prox Cx stent (unknown type) is widely patent. Previously placed Mid Cx stent (unknown type) is widely patent. Dist Cx lesion is 100% stenosed with 100% stenosed side branch in  Ost 3rd Mrg. The left ventricular systolic function is normal. LV end diastolic pressure is mildly elevated. The left ventricular ejection fraction is 55-65% by visual estimate.   1. Single vessel occlusive CAD involving the distal LCx. 2. Patent stents in the LAD, RCA and LCx 3. Normal LV function 4. Mildly elevated LVEDP   Plan: medical management.   Diagnostic Dominance: Right  Intervention    LHC 10/12/2023  Conclusions: Severe two-vessel vessel CAD with chronic subtotal/total occlusions of distal LCx/OM3 and rPDA. Nonobstructive LAD disease with patent stent. Mildly elevated left ventricular filling pressure (LVEDP 20 mmHg).   Recommendations: Continue medical  therapy; will increase isosorbide  mononitrate to 60 mg daily.  Though RPDA and mid/distal LCx disease has progressed from last catheterization in 2020, these lesions are not well-suited to PCI. Continue aggressive secondary prevention of coronary artery disease.   Sammy Crisp, MD [facility]  Diagnostic Dominance: Right  Intervention  Assessment & Plan   1.  Coronary artery disease-reports that he has been taking 30 mg of Imdur  medication.  Does continue to note some heartburn type sensation.  Appears stable.  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    Cardiac catheterization 10/12/2023 showed severe two-vessel coronary artery disease with subtotal/total occlusion of his distal circumflex/OM 3 and RPDA.  He was also noted to have progression of his disease when compared to prior cardiac catheterization in 2020.  Lesion was not well-suited for PCI.   His Imdur  was increased to 60 mg daily, however he was only taking 30 mg.  I updated his prescription. Continue aspirin , atorvastatin , Imdur   Chronic HFrEF-breathing at baseline.  Euvolemic.  Weight stable.  Echocardiogram 09/18/2023 showed an EF of 45-50%, normal diastolic parameters.   Continue Entresto , furosemide , spironolactone , potassium, Imdur  Restart entresto - some confusion per patient reporting someone has called him and told him to stop taking the medication.  Reports this happened 6 months ago.  No record of anyone from our office instructed the patient to stop his Entresto . Heart healthy diet Increase physical activity as tolerated Order  BMP in 1-2 weeks  Hyperlipidemia-LDL 68 on 12/24/22 Heart healthy diet.   Continue atorvastatin , aspirin    Essential hypertension-BP today 110/62 Increase physical activity as tolerated Heart healthy low-sodium diet Continue  spironolactone , Entresto , furosemide , Imdur   Second-degree AV block-device interrogation on  08/04/2023.  Loop recorder showed no significant changes and report was normal. Follows with EP     Disposition: Follow-up with Dr. Maximo Spar or me in 3-4 months.   Chet Cota. Hoyle Barkdull NP-C     11/27/2023, 9:58 AM [facility] [address] Office [phone] Fax [phone]    I spent 14 minutes examining this patient, reviewing medications, and using patient centered shared decision making involving her cardiac care.  Prior to her visit I spent greater than 20 minutes reviewing her past medical history,  medications, and prior cardiac tests.

## 2023-11-27 ENCOUNTER — Encounter: Payer: Self-pay | Admitting: General Practice

## 2023-11-27 ENCOUNTER — Other Ambulatory Visit: Payer: Self-pay

## 2023-11-27 ENCOUNTER — Other Ambulatory Visit (HOSPITAL_COMMUNITY): Payer: Self-pay

## 2023-11-27 ENCOUNTER — Ambulatory Visit: Attending: General Practice | Admitting: General Practice

## 2023-11-27 VITALS — BP 110/62 | HR 60 | Ht 74.0 in | Wt 262.2 lb

## 2023-11-27 DIAGNOSIS — E785 Hyperlipidemia, unspecified: Secondary | ICD-10-CM | POA: Diagnosis not present

## 2023-11-27 DIAGNOSIS — I10 Essential (primary) hypertension: Secondary | ICD-10-CM | POA: Diagnosis not present

## 2023-11-27 DIAGNOSIS — I25110 Atherosclerotic heart disease of native coronary artery with unstable angina pectoris: Secondary | ICD-10-CM

## 2023-11-27 DIAGNOSIS — I5022 Chronic systolic (congestive) heart failure: Secondary | ICD-10-CM | POA: Diagnosis not present

## 2023-11-27 DIAGNOSIS — I441 Atrioventricular block, second degree: Secondary | ICD-10-CM | POA: Diagnosis not present

## 2023-11-27 DIAGNOSIS — I255 Ischemic cardiomyopathy: Secondary | ICD-10-CM | POA: Diagnosis not present

## 2023-11-27 MED ORDER — ISOSORBIDE MONONITRATE ER 30 MG PO TB24
60.0000 mg | ORAL_TABLET | Freq: Every day | ORAL | 6 refills | Status: DC
  Filled 2023-11-27 – 2023-12-10 (×2): qty 120, 60d supply, fill #0
  Filled 2024-03-22 – 2024-04-11 (×2): qty 120, 60d supply, fill #1
  Filled 2024-07-13: qty 120, 60d supply, fill #2

## 2023-11-27 NOTE — Patient Instructions (Addendum)
 You may increase your sodium intake. Please also increase hydration!   Medication Instructions:  Your physician recommends that you continue on your current medications as directed. Please refer to the Current Medication list given to you today.  *If you need a refill on your cardiac medications before your next appointment, please call your pharmacy*  Lab Work: BMET- 2 weeks  You may go to any [facility] Location for your lab work:  [facility] - [address] ([facility]) - [address] [phone] [address]  [place] - [address]   [place]  - [address]   [place] - [address] - [address] [employer]  - [address] ([facility]   If you have labs (blood work) drawn today and your tests are completely normal, you will receive your results only by: [employer] (if you have MyChart)  If you have any lab test that is abnormal or we need to change your treatment, we will call you or send a MyChart message to review the results.  Testing/Procedures: None ordered.  Follow-Up: At [facility], you and your health needs are our priority.  As part of our continuing mission to provide you with exceptional heart care, we have created designated Provider Care Teams.  These Care Teams include your primary Cardiologist (physician) and Advanced Practice Providers (APPs -  Physician Assistants and Nurse Practitioners) who all work together to provide you with the care you need, when you need it.  Your next appointment:   3/4 months  The format for your next appointment:   In Person  Provider:   Lawana Pray, NP

## 2023-12-10 ENCOUNTER — Other Ambulatory Visit (HOSPITAL_COMMUNITY): Payer: Self-pay

## 2023-12-10 ENCOUNTER — Other Ambulatory Visit (HOSPITAL_BASED_OUTPATIENT_CLINIC_OR_DEPARTMENT_OTHER): Payer: Self-pay | Admitting: General Practice

## 2023-12-10 ENCOUNTER — Other Ambulatory Visit (HOSPITAL_COMMUNITY): Payer: Self-pay | Admitting: Family Medicine

## 2023-12-10 DIAGNOSIS — I5022 Chronic systolic (congestive) heart failure: Secondary | ICD-10-CM | POA: Diagnosis not present

## 2023-12-11 ENCOUNTER — Ambulatory Visit: Payer: Self-pay | Admitting: General Practice

## 2023-12-11 LAB — BASIC METABOLIC PANEL WITH GFR
BUN/Creatinine Ratio: 20 (ref 10–24)
BUN: 32 mg/dL — ABNORMAL HIGH (ref 8–27)
CO2: 22 mmol/L (ref 20–29)
Calcium: 10.8 mg/dL — ABNORMAL HIGH (ref 8.6–10.2)
Chloride: 105 mmol/L (ref 96–106)
Creatinine, Ser: 1.6 mg/dL — ABNORMAL HIGH (ref 0.76–1.27)
Glucose: 91 mg/dL (ref 70–99)
Potassium: 4.3 mmol/L (ref 3.5–5.2)
Sodium: 142 mmol/L (ref 134–144)
eGFR: 44 mL/min/{1.73_m2} — ABNORMAL LOW (ref >59)

## 2023-12-14 DIAGNOSIS — C61 Malignant neoplasm of prostate: Secondary | ICD-10-CM | POA: Diagnosis not present

## 2023-12-14 DIAGNOSIS — I5042 Chronic combined systolic (congestive) and diastolic (congestive) heart failure: Secondary | ICD-10-CM | POA: Diagnosis not present

## 2023-12-14 DIAGNOSIS — E782 Mixed hyperlipidemia: Secondary | ICD-10-CM | POA: Diagnosis not present

## 2023-12-14 DIAGNOSIS — I25118 Atherosclerotic heart disease of native coronary artery with other forms of angina pectoris: Secondary | ICD-10-CM | POA: Diagnosis not present

## 2024-01-05 ENCOUNTER — Other Ambulatory Visit (HOSPITAL_COMMUNITY): Payer: Self-pay

## 2024-01-13 ENCOUNTER — Other Ambulatory Visit (HOSPITAL_COMMUNITY): Payer: Self-pay

## 2024-01-13 ENCOUNTER — Other Ambulatory Visit: Payer: Self-pay

## 2024-01-13 MED ORDER — ALLOPURINOL 100 MG PO TABS
100.0000 mg | ORAL_TABLET | ORAL | 1 refills | Status: AC
  Filled 2024-01-13: qty 45, 90d supply, fill #0

## 2024-01-13 MED ORDER — ATORVASTATIN CALCIUM 40 MG PO TABS
40.0000 mg | ORAL_TABLET | ORAL | 3 refills | Status: AC
  Filled 2024-01-13 – 2024-02-11 (×2): qty 28, 98d supply, fill #0
  Filled 2024-06-14: qty 28, 98d supply, fill #1

## 2024-01-14 DIAGNOSIS — I25118 Atherosclerotic heart disease of native coronary artery with other forms of angina pectoris: Secondary | ICD-10-CM | POA: Diagnosis not present

## 2024-01-14 DIAGNOSIS — I5042 Chronic combined systolic (congestive) and diastolic (congestive) heart failure: Secondary | ICD-10-CM | POA: Diagnosis not present

## 2024-01-14 DIAGNOSIS — E782 Mixed hyperlipidemia: Secondary | ICD-10-CM | POA: Diagnosis not present

## 2024-01-14 DIAGNOSIS — C61 Malignant neoplasm of prostate: Secondary | ICD-10-CM | POA: Diagnosis not present

## 2024-01-20 DIAGNOSIS — L821 Other seborrheic keratosis: Secondary | ICD-10-CM | POA: Diagnosis not present

## 2024-01-20 DIAGNOSIS — L918 Other hypertrophic disorders of the skin: Secondary | ICD-10-CM | POA: Diagnosis not present

## 2024-01-20 DIAGNOSIS — Z08 Encounter for follow-up examination after completed treatment for malignant neoplasm: Secondary | ICD-10-CM | POA: Diagnosis not present

## 2024-01-20 DIAGNOSIS — D225 Melanocytic nevi of trunk: Secondary | ICD-10-CM | POA: Diagnosis not present

## 2024-01-20 DIAGNOSIS — B078 Other viral warts: Secondary | ICD-10-CM | POA: Diagnosis not present

## 2024-01-20 DIAGNOSIS — Z86006 Personal history of melanoma in-situ: Secondary | ICD-10-CM | POA: Diagnosis not present

## 2024-01-20 DIAGNOSIS — Z7189 Other specified counseling: Secondary | ICD-10-CM | POA: Diagnosis not present

## 2024-01-20 DIAGNOSIS — L570 Actinic keratosis: Secondary | ICD-10-CM | POA: Diagnosis not present

## 2024-01-20 DIAGNOSIS — Z85828 Personal history of other malignant neoplasm of skin: Secondary | ICD-10-CM | POA: Diagnosis not present

## 2024-02-11 ENCOUNTER — Other Ambulatory Visit (HOSPITAL_COMMUNITY): Payer: Self-pay

## 2024-02-11 ENCOUNTER — Other Ambulatory Visit: Payer: Self-pay

## 2024-02-14 DIAGNOSIS — E782 Mixed hyperlipidemia: Secondary | ICD-10-CM | POA: Diagnosis not present

## 2024-02-14 DIAGNOSIS — I5042 Chronic combined systolic (congestive) and diastolic (congestive) heart failure: Secondary | ICD-10-CM | POA: Diagnosis not present

## 2024-02-14 DIAGNOSIS — C61 Malignant neoplasm of prostate: Secondary | ICD-10-CM | POA: Diagnosis not present

## 2024-02-14 DIAGNOSIS — I25118 Atherosclerotic heart disease of native coronary artery with other forms of angina pectoris: Secondary | ICD-10-CM | POA: Diagnosis not present

## 2024-02-26 ENCOUNTER — Other Ambulatory Visit (HOSPITAL_COMMUNITY): Payer: Self-pay

## 2024-03-15 DIAGNOSIS — I5042 Chronic combined systolic (congestive) and diastolic (congestive) heart failure: Secondary | ICD-10-CM | POA: Diagnosis not present

## 2024-03-15 DIAGNOSIS — E782 Mixed hyperlipidemia: Secondary | ICD-10-CM | POA: Diagnosis not present

## 2024-03-15 DIAGNOSIS — I25118 Atherosclerotic heart disease of native coronary artery with other forms of angina pectoris: Secondary | ICD-10-CM | POA: Diagnosis not present

## 2024-03-15 DIAGNOSIS — C61 Malignant neoplasm of prostate: Secondary | ICD-10-CM | POA: Diagnosis not present

## 2024-03-22 ENCOUNTER — Other Ambulatory Visit: Payer: Self-pay

## 2024-03-22 ENCOUNTER — Other Ambulatory Visit (HOSPITAL_COMMUNITY): Payer: Self-pay

## 2024-04-11 ENCOUNTER — Other Ambulatory Visit (HOSPITAL_COMMUNITY): Payer: Self-pay

## 2024-04-13 ENCOUNTER — Other Ambulatory Visit (HOSPITAL_COMMUNITY): Payer: Self-pay

## 2024-04-13 MED ORDER — PANTOPRAZOLE SODIUM 40 MG PO TBEC
40.0000 mg | DELAYED_RELEASE_TABLET | Freq: Every day | ORAL | 0 refills | Status: DC
  Filled 2024-04-13: qty 90, 90d supply, fill #0

## 2024-04-15 DIAGNOSIS — I25118 Atherosclerotic heart disease of native coronary artery with other forms of angina pectoris: Secondary | ICD-10-CM | POA: Diagnosis not present

## 2024-04-15 DIAGNOSIS — C61 Malignant neoplasm of prostate: Secondary | ICD-10-CM | POA: Diagnosis not present

## 2024-04-15 DIAGNOSIS — I5042 Chronic combined systolic (congestive) and diastolic (congestive) heart failure: Secondary | ICD-10-CM | POA: Diagnosis not present

## 2024-04-15 DIAGNOSIS — E782 Mixed hyperlipidemia: Secondary | ICD-10-CM | POA: Diagnosis not present

## 2024-04-25 ENCOUNTER — Other Ambulatory Visit (HOSPITAL_COMMUNITY): Payer: Self-pay

## 2024-04-27 ENCOUNTER — Other Ambulatory Visit (HOSPITAL_COMMUNITY): Payer: Self-pay

## 2024-05-15 DIAGNOSIS — C61 Malignant neoplasm of prostate: Secondary | ICD-10-CM | POA: Diagnosis not present

## 2024-05-15 DIAGNOSIS — I25118 Atherosclerotic heart disease of native coronary artery with other forms of angina pectoris: Secondary | ICD-10-CM | POA: Diagnosis not present

## 2024-05-15 DIAGNOSIS — I5042 Chronic combined systolic (congestive) and diastolic (congestive) heart failure: Secondary | ICD-10-CM | POA: Diagnosis not present

## 2024-05-15 DIAGNOSIS — E782 Mixed hyperlipidemia: Secondary | ICD-10-CM | POA: Diagnosis not present

## 2024-05-25 ENCOUNTER — Other Ambulatory Visit (HOSPITAL_COMMUNITY): Payer: Self-pay

## 2024-05-25 DIAGNOSIS — R519 Headache, unspecified: Secondary | ICD-10-CM | POA: Diagnosis not present

## 2024-05-25 DIAGNOSIS — S99921A Unspecified injury of right foot, initial encounter: Secondary | ICD-10-CM | POA: Diagnosis not present

## 2024-05-25 DIAGNOSIS — H6692 Otitis media, unspecified, left ear: Secondary | ICD-10-CM | POA: Diagnosis not present

## 2024-05-25 DIAGNOSIS — H6121 Impacted cerumen, right ear: Secondary | ICD-10-CM | POA: Diagnosis not present

## 2024-05-25 MED ORDER — AMOXICILLIN-POT CLAVULANATE 875-125 MG PO TABS
1.0000 | ORAL_TABLET | Freq: Two times a day (BID) | ORAL | 0 refills | Status: AC
  Filled 2024-05-25: qty 6, 3d supply, fill #0

## 2024-05-26 ENCOUNTER — Other Ambulatory Visit (HOSPITAL_COMMUNITY): Payer: Self-pay

## 2024-06-13 ENCOUNTER — Other Ambulatory Visit: Payer: Self-pay

## 2024-06-13 ENCOUNTER — Other Ambulatory Visit (HOSPITAL_BASED_OUTPATIENT_CLINIC_OR_DEPARTMENT_OTHER): Payer: Self-pay

## 2024-06-13 MED ORDER — DOXYCYCLINE HYCLATE 100 MG PO TABS
100.0000 mg | ORAL_TABLET | Freq: Two times a day (BID) | ORAL | 0 refills | Status: AC
  Filled 2024-06-13 – 2024-06-15 (×2): qty 14, 7d supply, fill #0

## 2024-06-14 ENCOUNTER — Encounter: Payer: Self-pay | Admitting: Pharmacist

## 2024-06-14 ENCOUNTER — Other Ambulatory Visit: Payer: Self-pay

## 2024-06-14 ENCOUNTER — Other Ambulatory Visit (HOSPITAL_COMMUNITY): Payer: Self-pay

## 2024-06-15 ENCOUNTER — Other Ambulatory Visit (HOSPITAL_COMMUNITY): Payer: Self-pay

## 2024-06-21 ENCOUNTER — Other Ambulatory Visit (HOSPITAL_COMMUNITY): Payer: Self-pay

## 2024-07-13 ENCOUNTER — Other Ambulatory Visit: Payer: Self-pay | Admitting: General Practice

## 2024-07-13 ENCOUNTER — Other Ambulatory Visit: Payer: Self-pay

## 2024-07-13 ENCOUNTER — Other Ambulatory Visit (HOSPITAL_COMMUNITY): Payer: Self-pay

## 2024-07-13 DIAGNOSIS — I25110 Atherosclerotic heart disease of native coronary artery with unstable angina pectoris: Secondary | ICD-10-CM

## 2024-07-14 ENCOUNTER — Other Ambulatory Visit (HOSPITAL_COMMUNITY): Payer: Self-pay

## 2024-07-14 ENCOUNTER — Other Ambulatory Visit: Payer: Self-pay

## 2024-07-14 MED ORDER — FUROSEMIDE 40 MG PO TABS: 40.0000 mg | ORAL_TABLET | Freq: Two times a day (BID) | ORAL | 1 refills | Status: AC

## 2024-07-14 MED ORDER — PANTOPRAZOLE SODIUM 40 MG PO TBEC
40.0000 mg | DELAYED_RELEASE_TABLET | Freq: Every day | ORAL | 0 refills | Status: AC
  Filled 2024-07-14: qty 90, 90d supply, fill #0

## 2024-07-14 MED ORDER — ISOSORBIDE MONONITRATE ER 30 MG PO TB24: 60.0000 mg | ORAL_TABLET | Freq: Every day | ORAL | 1 refills | Status: AC
